# Patient Record
Sex: Female | Born: 1935
Health system: Southern US, Community
[De-identification: ages and names within clinical notes are randomized; demographics above are authoritative.]

## PROBLEM LIST (undated history)

## (undated) DIAGNOSIS — Z9989 Dependence on other enabling machines and devices: Secondary | ICD-10-CM

## (undated) DIAGNOSIS — Z780 Asymptomatic menopausal state: Secondary | ICD-10-CM

## (undated) DIAGNOSIS — C4491 Basal cell carcinoma of skin, unspecified: Secondary | ICD-10-CM

## (undated) DIAGNOSIS — C50919 Malignant neoplasm of unspecified site of unspecified female breast: Secondary | ICD-10-CM

## (undated) DIAGNOSIS — M1990 Unspecified osteoarthritis, unspecified site: Secondary | ICD-10-CM

## (undated) DIAGNOSIS — R7302 Impaired glucose tolerance (oral): Secondary | ICD-10-CM

## (undated) DIAGNOSIS — N390 Urinary tract infection, site not specified: Secondary | ICD-10-CM

## (undated) DIAGNOSIS — F325 Major depressive disorder, single episode, in full remission: Secondary | ICD-10-CM

## (undated) DIAGNOSIS — E039 Hypothyroidism, unspecified: Secondary | ICD-10-CM

## (undated) DIAGNOSIS — E785 Hyperlipidemia, unspecified: Secondary | ICD-10-CM

## (undated) DIAGNOSIS — R7303 Prediabetes: Secondary | ICD-10-CM

## (undated) DIAGNOSIS — I10 Essential (primary) hypertension: Secondary | ICD-10-CM

## (undated) DIAGNOSIS — N6019 Diffuse cystic mastopathy of unspecified breast: Secondary | ICD-10-CM

## (undated) DIAGNOSIS — E042 Nontoxic multinodular goiter: Secondary | ICD-10-CM

## (undated) DIAGNOSIS — N83209 Unspecified ovarian cyst, unspecified side: Secondary | ICD-10-CM

## (undated) DIAGNOSIS — M810 Age-related osteoporosis without current pathological fracture: Secondary | ICD-10-CM

## (undated) DIAGNOSIS — G4733 Obstructive sleep apnea (adult) (pediatric): Secondary | ICD-10-CM

## (undated) HISTORY — DX: Diffuse cystic mastopathy of unspecified breast: N60.19

## (undated) HISTORY — DX: Hyperlipidemia, unspecified: E78.5

## (undated) HISTORY — DX: Age-related osteoporosis without current pathological fracture: M81.0

## (undated) HISTORY — DX: Major depressive disorder, single episode, in full remission: F32.5

## (undated) HISTORY — PX: MASTECTOMY: SHX3

## (undated) HISTORY — DX: Urinary tract infection, site not specified: N39.0

## (undated) HISTORY — PX: URETEROSCOPY WITH HOLMIUM LASER LITHOTRIPSY: SHX6645

## (undated) HISTORY — DX: Basal cell carcinoma of skin, unspecified: C44.91

## (undated) HISTORY — DX: Unspecified osteoarthritis, unspecified site: M19.90

## (undated) HISTORY — DX: Essential (primary) hypertension: I10

## (undated) HISTORY — DX: Malignant neoplasm of unspecified site of unspecified female breast: C50.919

## (undated) HISTORY — DX: Impaired glucose tolerance (oral): R73.02

## (undated) HISTORY — DX: Dependence on other enabling machines and devices: Z99.89

## (undated) HISTORY — DX: Nontoxic multinodular goiter: E04.2

## (undated) HISTORY — DX: Asymptomatic menopausal state: Z78.0

## (undated) HISTORY — DX: Unspecified ovarian cyst, unspecified side: N83.209

## (undated) HISTORY — PX: URETEROSCOPY: SHX842

## (undated) HISTORY — DX: Prediabetes: R73.03

## (undated) HISTORY — DX: Hypothyroidism, unspecified: E03.9

## (undated) HISTORY — DX: Obstructive sleep apnea (adult) (pediatric): G47.33

---

## 1973-08-15 HISTORY — PX: ABDOMINAL HYSTERECTOMY: SUR658

## 1984-02-20 DIAGNOSIS — Z780 Asymptomatic menopausal state: Secondary | ICD-10-CM

## 1984-02-20 HISTORY — DX: Asymptomatic menopausal state: Z78.0

## 2003-01-23 ENCOUNTER — Other Ambulatory Visit: Admission: RE | Admit: 2003-01-23 | Discharge: 2003-01-23 | Payer: Self-pay | Admitting: Obstetrics and Gynecology

## 2003-02-20 DIAGNOSIS — R7302 Impaired glucose tolerance (oral): Secondary | ICD-10-CM | POA: Insufficient documentation

## 2003-02-20 DIAGNOSIS — M810 Age-related osteoporosis without current pathological fracture: Secondary | ICD-10-CM | POA: Insufficient documentation

## 2003-02-20 DIAGNOSIS — M1990 Unspecified osteoarthritis, unspecified site: Secondary | ICD-10-CM | POA: Insufficient documentation

## 2003-02-20 DIAGNOSIS — R7303 Prediabetes: Secondary | ICD-10-CM

## 2003-02-20 HISTORY — DX: Prediabetes: R73.03

## 2003-02-20 HISTORY — DX: Age-related osteoporosis without current pathological fracture: M81.0

## 2003-02-20 HISTORY — DX: Impaired glucose tolerance (oral): R73.02

## 2003-02-20 HISTORY — DX: Unspecified osteoarthritis, unspecified site: M19.90

## 2003-03-06 DIAGNOSIS — E042 Nontoxic multinodular goiter: Secondary | ICD-10-CM

## 2003-03-06 HISTORY — DX: Nontoxic multinodular goiter: E04.2

## 2005-12-06 ENCOUNTER — Ambulatory Visit: Payer: Self-pay | Admitting: Unknown Physician Specialty

## 2007-08-30 ENCOUNTER — Ambulatory Visit: Payer: Self-pay | Admitting: Unknown Physician Specialty

## 2009-02-19 DIAGNOSIS — N83209 Unspecified ovarian cyst, unspecified side: Secondary | ICD-10-CM

## 2009-02-19 HISTORY — DX: Unspecified ovarian cyst, unspecified side: N83.209

## 2010-05-14 ENCOUNTER — Ambulatory Visit: Payer: Self-pay | Admitting: Unknown Physician Specialty

## 2011-08-11 ENCOUNTER — Ambulatory Visit: Payer: Self-pay | Admitting: Internal Medicine

## 2011-08-25 DIAGNOSIS — H6120 Impacted cerumen, unspecified ear: Secondary | ICD-10-CM | POA: Diagnosis not present

## 2011-08-25 DIAGNOSIS — J029 Acute pharyngitis, unspecified: Secondary | ICD-10-CM | POA: Diagnosis not present

## 2011-09-13 DIAGNOSIS — E782 Mixed hyperlipidemia: Secondary | ICD-10-CM | POA: Diagnosis not present

## 2011-09-13 DIAGNOSIS — M810 Age-related osteoporosis without current pathological fracture: Secondary | ICD-10-CM | POA: Diagnosis not present

## 2011-09-13 DIAGNOSIS — N83209 Unspecified ovarian cyst, unspecified side: Secondary | ICD-10-CM | POA: Diagnosis not present

## 2011-09-13 DIAGNOSIS — I10 Essential (primary) hypertension: Secondary | ICD-10-CM | POA: Diagnosis not present

## 2011-09-13 DIAGNOSIS — R300 Dysuria: Secondary | ICD-10-CM | POA: Diagnosis not present

## 2011-09-13 DIAGNOSIS — Z0000 Encounter for general adult medical examination without abnormal findings: Secondary | ICD-10-CM | POA: Diagnosis not present

## 2011-10-29 DIAGNOSIS — H1010 Acute atopic conjunctivitis, unspecified eye: Secondary | ICD-10-CM | POA: Diagnosis not present

## 2011-10-29 DIAGNOSIS — J309 Allergic rhinitis, unspecified: Secondary | ICD-10-CM | POA: Diagnosis not present

## 2011-11-09 DIAGNOSIS — M549 Dorsalgia, unspecified: Secondary | ICD-10-CM | POA: Diagnosis not present

## 2011-11-09 DIAGNOSIS — M542 Cervicalgia: Secondary | ICD-10-CM | POA: Diagnosis not present

## 2011-11-09 DIAGNOSIS — M999 Biomechanical lesion, unspecified: Secondary | ICD-10-CM | POA: Diagnosis not present

## 2011-11-09 DIAGNOSIS — M9981 Other biomechanical lesions of cervical region: Secondary | ICD-10-CM | POA: Diagnosis not present

## 2011-11-18 DIAGNOSIS — J019 Acute sinusitis, unspecified: Secondary | ICD-10-CM | POA: Diagnosis not present

## 2011-11-18 DIAGNOSIS — I10 Essential (primary) hypertension: Secondary | ICD-10-CM | POA: Diagnosis not present

## 2011-11-18 DIAGNOSIS — M542 Cervicalgia: Secondary | ICD-10-CM | POA: Diagnosis not present

## 2011-11-18 DIAGNOSIS — M62838 Other muscle spasm: Secondary | ICD-10-CM | POA: Diagnosis not present

## 2011-11-18 DIAGNOSIS — M810 Age-related osteoporosis without current pathological fracture: Secondary | ICD-10-CM | POA: Diagnosis not present

## 2011-11-18 DIAGNOSIS — F329 Major depressive disorder, single episode, unspecified: Secondary | ICD-10-CM | POA: Diagnosis not present

## 2011-11-29 DIAGNOSIS — H6120 Impacted cerumen, unspecified ear: Secondary | ICD-10-CM | POA: Diagnosis not present

## 2012-01-12 DIAGNOSIS — H43819 Vitreous degeneration, unspecified eye: Secondary | ICD-10-CM | POA: Diagnosis not present

## 2012-01-25 DIAGNOSIS — N83209 Unspecified ovarian cyst, unspecified side: Secondary | ICD-10-CM | POA: Diagnosis not present

## 2012-01-27 DIAGNOSIS — Z79899 Other long term (current) drug therapy: Secondary | ICD-10-CM | POA: Diagnosis not present

## 2012-01-27 DIAGNOSIS — E782 Mixed hyperlipidemia: Secondary | ICD-10-CM | POA: Diagnosis not present

## 2012-01-27 DIAGNOSIS — E559 Vitamin D deficiency, unspecified: Secondary | ICD-10-CM | POA: Diagnosis not present

## 2012-02-08 DIAGNOSIS — M2040 Other hammer toe(s) (acquired), unspecified foot: Secondary | ICD-10-CM | POA: Diagnosis not present

## 2012-02-09 DIAGNOSIS — F5105 Insomnia due to other mental disorder: Secondary | ICD-10-CM | POA: Diagnosis not present

## 2012-02-09 DIAGNOSIS — F329 Major depressive disorder, single episode, unspecified: Secondary | ICD-10-CM | POA: Diagnosis not present

## 2012-02-09 DIAGNOSIS — F489 Nonpsychotic mental disorder, unspecified: Secondary | ICD-10-CM | POA: Diagnosis not present

## 2012-02-09 DIAGNOSIS — E782 Mixed hyperlipidemia: Secondary | ICD-10-CM | POA: Diagnosis not present

## 2012-02-09 DIAGNOSIS — M810 Age-related osteoporosis without current pathological fracture: Secondary | ICD-10-CM | POA: Diagnosis not present

## 2012-02-28 DIAGNOSIS — E039 Hypothyroidism, unspecified: Secondary | ICD-10-CM

## 2012-02-28 HISTORY — DX: Hypothyroidism, unspecified: E03.9

## 2012-03-01 DIAGNOSIS — Z803 Family history of malignant neoplasm of breast: Secondary | ICD-10-CM | POA: Diagnosis not present

## 2012-03-01 DIAGNOSIS — Z1231 Encounter for screening mammogram for malignant neoplasm of breast: Secondary | ICD-10-CM | POA: Diagnosis not present

## 2012-03-05 DIAGNOSIS — Z0000 Encounter for general adult medical examination without abnormal findings: Secondary | ICD-10-CM | POA: Diagnosis not present

## 2012-03-05 DIAGNOSIS — E041 Nontoxic single thyroid nodule: Secondary | ICD-10-CM | POA: Diagnosis not present

## 2012-03-05 DIAGNOSIS — E042 Nontoxic multinodular goiter: Secondary | ICD-10-CM | POA: Diagnosis not present

## 2012-03-08 DIAGNOSIS — N390 Urinary tract infection, site not specified: Secondary | ICD-10-CM | POA: Diagnosis not present

## 2012-03-08 DIAGNOSIS — I10 Essential (primary) hypertension: Secondary | ICD-10-CM | POA: Diagnosis not present

## 2012-03-08 DIAGNOSIS — R300 Dysuria: Secondary | ICD-10-CM | POA: Diagnosis not present

## 2012-03-08 DIAGNOSIS — E782 Mixed hyperlipidemia: Secondary | ICD-10-CM | POA: Diagnosis not present

## 2012-04-19 DIAGNOSIS — I10 Essential (primary) hypertension: Secondary | ICD-10-CM | POA: Diagnosis not present

## 2012-04-19 DIAGNOSIS — M810 Age-related osteoporosis without current pathological fracture: Secondary | ICD-10-CM | POA: Diagnosis not present

## 2012-04-19 DIAGNOSIS — N390 Urinary tract infection, site not specified: Secondary | ICD-10-CM | POA: Diagnosis not present

## 2012-04-19 DIAGNOSIS — E782 Mixed hyperlipidemia: Secondary | ICD-10-CM | POA: Diagnosis not present

## 2012-05-29 DIAGNOSIS — Z23 Encounter for immunization: Secondary | ICD-10-CM | POA: Diagnosis not present

## 2012-06-11 DIAGNOSIS — N390 Urinary tract infection, site not specified: Secondary | ICD-10-CM | POA: Diagnosis not present

## 2012-06-11 DIAGNOSIS — R300 Dysuria: Secondary | ICD-10-CM | POA: Diagnosis not present

## 2012-08-13 DIAGNOSIS — E039 Hypothyroidism, unspecified: Secondary | ICD-10-CM | POA: Diagnosis not present

## 2012-08-13 DIAGNOSIS — E782 Mixed hyperlipidemia: Secondary | ICD-10-CM | POA: Diagnosis not present

## 2012-08-23 DIAGNOSIS — E782 Mixed hyperlipidemia: Secondary | ICD-10-CM | POA: Diagnosis not present

## 2012-08-23 DIAGNOSIS — Z0000 Encounter for general adult medical examination without abnormal findings: Secondary | ICD-10-CM | POA: Diagnosis not present

## 2012-08-23 DIAGNOSIS — F329 Major depressive disorder, single episode, unspecified: Secondary | ICD-10-CM | POA: Diagnosis not present

## 2012-08-23 DIAGNOSIS — N390 Urinary tract infection, site not specified: Secondary | ICD-10-CM | POA: Diagnosis not present

## 2012-08-23 DIAGNOSIS — N952 Postmenopausal atrophic vaginitis: Secondary | ICD-10-CM | POA: Diagnosis not present

## 2012-08-23 DIAGNOSIS — R300 Dysuria: Secondary | ICD-10-CM | POA: Diagnosis not present

## 2012-08-23 DIAGNOSIS — E039 Hypothyroidism, unspecified: Secondary | ICD-10-CM | POA: Diagnosis not present

## 2012-08-23 DIAGNOSIS — R55 Syncope and collapse: Secondary | ICD-10-CM | POA: Diagnosis not present

## 2012-08-31 DIAGNOSIS — R55 Syncope and collapse: Secondary | ICD-10-CM | POA: Diagnosis not present

## 2012-09-17 DIAGNOSIS — M2010 Hallux valgus (acquired), unspecified foot: Secondary | ICD-10-CM | POA: Diagnosis not present

## 2012-09-17 DIAGNOSIS — M2040 Other hammer toe(s) (acquired), unspecified foot: Secondary | ICD-10-CM | POA: Diagnosis not present

## 2012-09-17 DIAGNOSIS — Q6689 Other  specified congenital deformities of feet: Secondary | ICD-10-CM | POA: Diagnosis not present

## 2012-09-26 DIAGNOSIS — D485 Neoplasm of uncertain behavior of skin: Secondary | ICD-10-CM | POA: Diagnosis not present

## 2012-09-26 DIAGNOSIS — L570 Actinic keratosis: Secondary | ICD-10-CM | POA: Diagnosis not present

## 2012-09-26 DIAGNOSIS — Z85828 Personal history of other malignant neoplasm of skin: Secondary | ICD-10-CM | POA: Diagnosis not present

## 2012-09-26 DIAGNOSIS — D0470 Carcinoma in situ of skin of unspecified lower limb, including hip: Secondary | ICD-10-CM | POA: Diagnosis not present

## 2012-10-04 DIAGNOSIS — I10 Essential (primary) hypertension: Secondary | ICD-10-CM | POA: Diagnosis not present

## 2012-10-04 DIAGNOSIS — E039 Hypothyroidism, unspecified: Secondary | ICD-10-CM | POA: Diagnosis not present

## 2012-10-04 DIAGNOSIS — M810 Age-related osteoporosis without current pathological fracture: Secondary | ICD-10-CM | POA: Diagnosis not present

## 2012-10-04 DIAGNOSIS — M542 Cervicalgia: Secondary | ICD-10-CM | POA: Diagnosis not present

## 2012-10-04 DIAGNOSIS — Z006 Encounter for examination for normal comparison and control in clinical research program: Secondary | ICD-10-CM | POA: Diagnosis not present

## 2012-10-04 DIAGNOSIS — E782 Mixed hyperlipidemia: Secondary | ICD-10-CM | POA: Diagnosis not present

## 2012-10-04 DIAGNOSIS — N390 Urinary tract infection, site not specified: Secondary | ICD-10-CM | POA: Diagnosis not present

## 2012-10-04 DIAGNOSIS — R51 Headache: Secondary | ICD-10-CM | POA: Diagnosis not present

## 2012-10-24 DIAGNOSIS — M999 Biomechanical lesion, unspecified: Secondary | ICD-10-CM | POA: Diagnosis not present

## 2012-10-24 DIAGNOSIS — M542 Cervicalgia: Secondary | ICD-10-CM | POA: Diagnosis not present

## 2012-10-24 DIAGNOSIS — M549 Dorsalgia, unspecified: Secondary | ICD-10-CM | POA: Diagnosis not present

## 2012-10-24 DIAGNOSIS — M9981 Other biomechanical lesions of cervical region: Secondary | ICD-10-CM | POA: Diagnosis not present

## 2012-11-02 DIAGNOSIS — M76829 Posterior tibial tendinitis, unspecified leg: Secondary | ICD-10-CM | POA: Diagnosis not present

## 2012-11-15 DIAGNOSIS — D0470 Carcinoma in situ of skin of unspecified lower limb, including hip: Secondary | ICD-10-CM | POA: Diagnosis not present

## 2012-11-28 DIAGNOSIS — M76829 Posterior tibial tendinitis, unspecified leg: Secondary | ICD-10-CM | POA: Diagnosis not present

## 2012-12-03 DIAGNOSIS — E782 Mixed hyperlipidemia: Secondary | ICD-10-CM | POA: Diagnosis not present

## 2012-12-03 DIAGNOSIS — R5381 Other malaise: Secondary | ICD-10-CM | POA: Diagnosis not present

## 2012-12-03 DIAGNOSIS — R300 Dysuria: Secondary | ICD-10-CM | POA: Diagnosis not present

## 2012-12-03 DIAGNOSIS — R5383 Other fatigue: Secondary | ICD-10-CM | POA: Diagnosis not present

## 2012-12-03 DIAGNOSIS — E039 Hypothyroidism, unspecified: Secondary | ICD-10-CM | POA: Diagnosis not present

## 2012-12-03 DIAGNOSIS — N952 Postmenopausal atrophic vaginitis: Secondary | ICD-10-CM | POA: Diagnosis not present

## 2012-12-26 DIAGNOSIS — E039 Hypothyroidism, unspecified: Secondary | ICD-10-CM | POA: Diagnosis not present

## 2013-01-08 DIAGNOSIS — M19079 Primary osteoarthritis, unspecified ankle and foot: Secondary | ICD-10-CM | POA: Diagnosis not present

## 2013-01-08 DIAGNOSIS — R609 Edema, unspecified: Secondary | ICD-10-CM | POA: Diagnosis not present

## 2013-01-15 DIAGNOSIS — M9981 Other biomechanical lesions of cervical region: Secondary | ICD-10-CM | POA: Diagnosis not present

## 2013-01-15 DIAGNOSIS — M999 Biomechanical lesion, unspecified: Secondary | ICD-10-CM | POA: Diagnosis not present

## 2013-01-15 DIAGNOSIS — M5137 Other intervertebral disc degeneration, lumbosacral region: Secondary | ICD-10-CM | POA: Diagnosis not present

## 2013-01-15 DIAGNOSIS — M5030 Other cervical disc degeneration, unspecified cervical region: Secondary | ICD-10-CM | POA: Diagnosis not present

## 2013-01-16 DIAGNOSIS — M5030 Other cervical disc degeneration, unspecified cervical region: Secondary | ICD-10-CM | POA: Diagnosis not present

## 2013-01-16 DIAGNOSIS — M9981 Other biomechanical lesions of cervical region: Secondary | ICD-10-CM | POA: Diagnosis not present

## 2013-01-16 DIAGNOSIS — M5137 Other intervertebral disc degeneration, lumbosacral region: Secondary | ICD-10-CM | POA: Diagnosis not present

## 2013-01-16 DIAGNOSIS — M999 Biomechanical lesion, unspecified: Secondary | ICD-10-CM | POA: Diagnosis not present

## 2013-01-23 DIAGNOSIS — Z0189 Encounter for other specified special examinations: Secondary | ICD-10-CM | POA: Diagnosis not present

## 2013-02-19 DIAGNOSIS — N6019 Diffuse cystic mastopathy of unspecified breast: Secondary | ICD-10-CM | POA: Insufficient documentation

## 2013-02-19 DIAGNOSIS — I10 Essential (primary) hypertension: Secondary | ICD-10-CM

## 2013-02-19 DIAGNOSIS — C4491 Basal cell carcinoma of skin, unspecified: Secondary | ICD-10-CM | POA: Insufficient documentation

## 2013-02-19 DIAGNOSIS — E785 Hyperlipidemia, unspecified: Secondary | ICD-10-CM

## 2013-02-19 HISTORY — DX: Basal cell carcinoma of skin, unspecified: C44.91

## 2013-02-19 HISTORY — DX: Diffuse cystic mastopathy of unspecified breast: N60.19

## 2013-02-19 HISTORY — DX: Essential (primary) hypertension: I10

## 2013-02-19 HISTORY — DX: Hyperlipidemia, unspecified: E78.5

## 2013-02-20 DIAGNOSIS — M5030 Other cervical disc degeneration, unspecified cervical region: Secondary | ICD-10-CM | POA: Diagnosis not present

## 2013-02-20 DIAGNOSIS — M9981 Other biomechanical lesions of cervical region: Secondary | ICD-10-CM | POA: Diagnosis not present

## 2013-02-20 DIAGNOSIS — IMO0002 Reserved for concepts with insufficient information to code with codable children: Secondary | ICD-10-CM | POA: Diagnosis not present

## 2013-02-20 DIAGNOSIS — M999 Biomechanical lesion, unspecified: Secondary | ICD-10-CM | POA: Diagnosis not present

## 2013-02-21 DIAGNOSIS — Z85828 Personal history of other malignant neoplasm of skin: Secondary | ICD-10-CM | POA: Diagnosis not present

## 2013-02-21 DIAGNOSIS — L821 Other seborrheic keratosis: Secondary | ICD-10-CM | POA: Diagnosis not present

## 2013-02-27 DIAGNOSIS — R918 Other nonspecific abnormal finding of lung field: Secondary | ICD-10-CM | POA: Diagnosis not present

## 2013-02-27 DIAGNOSIS — R937 Abnormal findings on diagnostic imaging of other parts of musculoskeletal system: Secondary | ICD-10-CM | POA: Diagnosis not present

## 2013-02-27 DIAGNOSIS — R51 Headache: Secondary | ICD-10-CM | POA: Diagnosis not present

## 2013-02-27 DIAGNOSIS — R609 Edema, unspecified: Secondary | ICD-10-CM | POA: Diagnosis not present

## 2013-02-27 DIAGNOSIS — I10 Essential (primary) hypertension: Secondary | ICD-10-CM | POA: Diagnosis not present

## 2013-02-27 DIAGNOSIS — Z136 Encounter for screening for cardiovascular disorders: Secondary | ICD-10-CM | POA: Diagnosis not present

## 2013-02-27 DIAGNOSIS — M810 Age-related osteoporosis without current pathological fracture: Secondary | ICD-10-CM | POA: Diagnosis not present

## 2013-02-27 DIAGNOSIS — E039 Hypothyroidism, unspecified: Secondary | ICD-10-CM | POA: Diagnosis not present

## 2013-02-27 DIAGNOSIS — E785 Hyperlipidemia, unspecified: Secondary | ICD-10-CM | POA: Diagnosis not present

## 2013-02-27 DIAGNOSIS — R42 Dizziness and giddiness: Secondary | ICD-10-CM | POA: Diagnosis not present

## 2013-02-27 DIAGNOSIS — R269 Unspecified abnormalities of gait and mobility: Secondary | ICD-10-CM | POA: Diagnosis not present

## 2013-02-27 DIAGNOSIS — R011 Cardiac murmur, unspecified: Secondary | ICD-10-CM | POA: Diagnosis not present

## 2013-02-27 DIAGNOSIS — R7309 Other abnormal glucose: Secondary | ICD-10-CM | POA: Diagnosis not present

## 2013-02-27 DIAGNOSIS — E042 Nontoxic multinodular goiter: Secondary | ICD-10-CM | POA: Diagnosis not present

## 2013-02-27 DIAGNOSIS — I700 Atherosclerosis of aorta: Secondary | ICD-10-CM | POA: Diagnosis not present

## 2013-02-27 DIAGNOSIS — N6019 Diffuse cystic mastopathy of unspecified breast: Secondary | ICD-10-CM | POA: Diagnosis not present

## 2013-02-27 DIAGNOSIS — K449 Diaphragmatic hernia without obstruction or gangrene: Secondary | ICD-10-CM | POA: Diagnosis not present

## 2013-03-07 DIAGNOSIS — I517 Cardiomegaly: Secondary | ICD-10-CM | POA: Diagnosis not present

## 2013-03-07 DIAGNOSIS — I059 Rheumatic mitral valve disease, unspecified: Secondary | ICD-10-CM | POA: Diagnosis not present

## 2013-03-07 DIAGNOSIS — I079 Rheumatic tricuspid valve disease, unspecified: Secondary | ICD-10-CM | POA: Diagnosis not present

## 2013-03-07 DIAGNOSIS — Z0000 Encounter for general adult medical examination without abnormal findings: Secondary | ICD-10-CM | POA: Diagnosis not present

## 2013-03-07 DIAGNOSIS — R51 Headache: Secondary | ICD-10-CM | POA: Diagnosis not present

## 2013-03-07 DIAGNOSIS — R42 Dizziness and giddiness: Secondary | ICD-10-CM | POA: Diagnosis not present

## 2013-03-07 DIAGNOSIS — R011 Cardiac murmur, unspecified: Secondary | ICD-10-CM | POA: Diagnosis not present

## 2013-03-07 DIAGNOSIS — I6789 Other cerebrovascular disease: Secondary | ICD-10-CM | POA: Diagnosis not present

## 2013-03-13 DIAGNOSIS — H8110 Benign paroxysmal vertigo, unspecified ear: Secondary | ICD-10-CM | POA: Diagnosis not present

## 2013-03-13 DIAGNOSIS — H6120 Impacted cerumen, unspecified ear: Secondary | ICD-10-CM | POA: Diagnosis not present

## 2013-03-15 DIAGNOSIS — Z1231 Encounter for screening mammogram for malignant neoplasm of breast: Secondary | ICD-10-CM | POA: Diagnosis not present

## 2013-03-27 DIAGNOSIS — H2510 Age-related nuclear cataract, unspecified eye: Secondary | ICD-10-CM | POA: Diagnosis not present

## 2013-04-04 DIAGNOSIS — M25579 Pain in unspecified ankle and joints of unspecified foot: Secondary | ICD-10-CM | POA: Diagnosis not present

## 2013-04-09 DIAGNOSIS — M25579 Pain in unspecified ankle and joints of unspecified foot: Secondary | ICD-10-CM | POA: Diagnosis not present

## 2013-04-11 DIAGNOSIS — M25579 Pain in unspecified ankle and joints of unspecified foot: Secondary | ICD-10-CM | POA: Diagnosis not present

## 2013-04-15 DIAGNOSIS — M25579 Pain in unspecified ankle and joints of unspecified foot: Secondary | ICD-10-CM | POA: Diagnosis not present

## 2013-04-16 DIAGNOSIS — M25579 Pain in unspecified ankle and joints of unspecified foot: Secondary | ICD-10-CM | POA: Diagnosis not present

## 2013-04-19 DIAGNOSIS — M25579 Pain in unspecified ankle and joints of unspecified foot: Secondary | ICD-10-CM | POA: Diagnosis not present

## 2013-04-23 DIAGNOSIS — M25579 Pain in unspecified ankle and joints of unspecified foot: Secondary | ICD-10-CM | POA: Diagnosis not present

## 2013-04-25 DIAGNOSIS — M25579 Pain in unspecified ankle and joints of unspecified foot: Secondary | ICD-10-CM | POA: Diagnosis not present

## 2013-04-30 DIAGNOSIS — M25579 Pain in unspecified ankle and joints of unspecified foot: Secondary | ICD-10-CM | POA: Diagnosis not present

## 2013-05-02 DIAGNOSIS — M25579 Pain in unspecified ankle and joints of unspecified foot: Secondary | ICD-10-CM | POA: Diagnosis not present

## 2013-05-07 DIAGNOSIS — M25579 Pain in unspecified ankle and joints of unspecified foot: Secondary | ICD-10-CM | POA: Diagnosis not present

## 2013-05-09 DIAGNOSIS — E782 Mixed hyperlipidemia: Secondary | ICD-10-CM | POA: Diagnosis not present

## 2013-05-09 DIAGNOSIS — F489 Nonpsychotic mental disorder, unspecified: Secondary | ICD-10-CM | POA: Diagnosis not present

## 2013-05-09 DIAGNOSIS — E039 Hypothyroidism, unspecified: Secondary | ICD-10-CM | POA: Diagnosis not present

## 2013-05-09 DIAGNOSIS — M810 Age-related osteoporosis without current pathological fracture: Secondary | ICD-10-CM | POA: Diagnosis not present

## 2013-05-09 DIAGNOSIS — R300 Dysuria: Secondary | ICD-10-CM | POA: Diagnosis not present

## 2013-05-09 DIAGNOSIS — F5105 Insomnia due to other mental disorder: Secondary | ICD-10-CM | POA: Diagnosis not present

## 2013-05-09 DIAGNOSIS — M19079 Primary osteoarthritis, unspecified ankle and foot: Secondary | ICD-10-CM | POA: Diagnosis not present

## 2013-05-09 DIAGNOSIS — Z23 Encounter for immunization: Secondary | ICD-10-CM | POA: Diagnosis not present

## 2013-05-14 DIAGNOSIS — M25579 Pain in unspecified ankle and joints of unspecified foot: Secondary | ICD-10-CM | POA: Diagnosis not present

## 2013-05-15 DIAGNOSIS — M25579 Pain in unspecified ankle and joints of unspecified foot: Secondary | ICD-10-CM | POA: Diagnosis not present

## 2013-05-16 DIAGNOSIS — M25579 Pain in unspecified ankle and joints of unspecified foot: Secondary | ICD-10-CM | POA: Diagnosis not present

## 2013-05-21 DIAGNOSIS — M25579 Pain in unspecified ankle and joints of unspecified foot: Secondary | ICD-10-CM | POA: Diagnosis not present

## 2013-05-23 DIAGNOSIS — M25579 Pain in unspecified ankle and joints of unspecified foot: Secondary | ICD-10-CM | POA: Diagnosis not present

## 2013-06-03 DIAGNOSIS — R300 Dysuria: Secondary | ICD-10-CM | POA: Diagnosis not present

## 2013-06-03 DIAGNOSIS — N390 Urinary tract infection, site not specified: Secondary | ICD-10-CM | POA: Diagnosis not present

## 2013-06-03 DIAGNOSIS — R3129 Other microscopic hematuria: Secondary | ICD-10-CM | POA: Diagnosis not present

## 2013-06-03 DIAGNOSIS — N952 Postmenopausal atrophic vaginitis: Secondary | ICD-10-CM | POA: Diagnosis not present

## 2013-06-17 DIAGNOSIS — N390 Urinary tract infection, site not specified: Secondary | ICD-10-CM | POA: Diagnosis not present

## 2013-06-17 DIAGNOSIS — R319 Hematuria, unspecified: Secondary | ICD-10-CM | POA: Diagnosis not present

## 2013-06-28 DIAGNOSIS — N390 Urinary tract infection, site not specified: Secondary | ICD-10-CM | POA: Diagnosis not present

## 2013-07-04 DIAGNOSIS — R300 Dysuria: Secondary | ICD-10-CM | POA: Diagnosis not present

## 2013-07-04 DIAGNOSIS — N390 Urinary tract infection, site not specified: Secondary | ICD-10-CM | POA: Diagnosis not present

## 2013-07-04 DIAGNOSIS — R319 Hematuria, unspecified: Secondary | ICD-10-CM | POA: Diagnosis not present

## 2013-07-23 DIAGNOSIS — N952 Postmenopausal atrophic vaginitis: Secondary | ICD-10-CM | POA: Diagnosis not present

## 2013-07-23 DIAGNOSIS — N390 Urinary tract infection, site not specified: Secondary | ICD-10-CM | POA: Diagnosis not present

## 2013-08-20 DIAGNOSIS — N390 Urinary tract infection, site not specified: Secondary | ICD-10-CM | POA: Diagnosis not present

## 2013-08-20 DIAGNOSIS — N952 Postmenopausal atrophic vaginitis: Secondary | ICD-10-CM | POA: Diagnosis not present

## 2013-09-05 DIAGNOSIS — F3289 Other specified depressive episodes: Secondary | ICD-10-CM | POA: Diagnosis not present

## 2013-09-05 DIAGNOSIS — R5383 Other fatigue: Secondary | ICD-10-CM | POA: Diagnosis not present

## 2013-09-05 DIAGNOSIS — N390 Urinary tract infection, site not specified: Secondary | ICD-10-CM | POA: Diagnosis not present

## 2013-09-05 DIAGNOSIS — E039 Hypothyroidism, unspecified: Secondary | ICD-10-CM | POA: Diagnosis not present

## 2013-09-05 DIAGNOSIS — M810 Age-related osteoporosis without current pathological fracture: Secondary | ICD-10-CM | POA: Diagnosis not present

## 2013-09-05 DIAGNOSIS — R300 Dysuria: Secondary | ICD-10-CM | POA: Diagnosis not present

## 2013-09-05 DIAGNOSIS — R5381 Other malaise: Secondary | ICD-10-CM | POA: Diagnosis not present

## 2013-09-05 DIAGNOSIS — F329 Major depressive disorder, single episode, unspecified: Secondary | ICD-10-CM | POA: Diagnosis not present

## 2013-09-09 DIAGNOSIS — E785 Hyperlipidemia, unspecified: Secondary | ICD-10-CM | POA: Diagnosis not present

## 2013-09-09 DIAGNOSIS — M810 Age-related osteoporosis without current pathological fracture: Secondary | ICD-10-CM | POA: Diagnosis not present

## 2013-09-09 DIAGNOSIS — E039 Hypothyroidism, unspecified: Secondary | ICD-10-CM | POA: Diagnosis not present

## 2013-09-09 DIAGNOSIS — R7309 Other abnormal glucose: Secondary | ICD-10-CM | POA: Diagnosis not present

## 2013-09-09 DIAGNOSIS — I10 Essential (primary) hypertension: Secondary | ICD-10-CM | POA: Diagnosis not present

## 2013-09-09 DIAGNOSIS — Z79899 Other long term (current) drug therapy: Secondary | ICD-10-CM | POA: Diagnosis not present

## 2013-09-09 DIAGNOSIS — E042 Nontoxic multinodular goiter: Secondary | ICD-10-CM | POA: Diagnosis not present

## 2013-09-19 DIAGNOSIS — H6120 Impacted cerumen, unspecified ear: Secondary | ICD-10-CM | POA: Diagnosis not present

## 2013-09-19 DIAGNOSIS — J018 Other acute sinusitis: Secondary | ICD-10-CM | POA: Diagnosis not present

## 2013-09-26 DIAGNOSIS — B372 Candidiasis of skin and nail: Secondary | ICD-10-CM | POA: Diagnosis not present

## 2013-09-26 DIAGNOSIS — L570 Actinic keratosis: Secondary | ICD-10-CM | POA: Diagnosis not present

## 2013-09-27 DIAGNOSIS — N390 Urinary tract infection, site not specified: Secondary | ICD-10-CM | POA: Diagnosis not present

## 2013-10-24 DIAGNOSIS — N390 Urinary tract infection, site not specified: Secondary | ICD-10-CM | POA: Diagnosis not present

## 2013-10-24 DIAGNOSIS — I10 Essential (primary) hypertension: Secondary | ICD-10-CM | POA: Diagnosis not present

## 2013-10-24 DIAGNOSIS — R300 Dysuria: Secondary | ICD-10-CM | POA: Diagnosis not present

## 2013-11-05 DIAGNOSIS — F3289 Other specified depressive episodes: Secondary | ICD-10-CM | POA: Diagnosis not present

## 2013-11-05 DIAGNOSIS — E782 Mixed hyperlipidemia: Secondary | ICD-10-CM | POA: Diagnosis not present

## 2013-11-05 DIAGNOSIS — R300 Dysuria: Secondary | ICD-10-CM | POA: Diagnosis not present

## 2013-11-05 DIAGNOSIS — F5105 Insomnia due to other mental disorder: Secondary | ICD-10-CM | POA: Diagnosis not present

## 2013-11-05 DIAGNOSIS — F329 Major depressive disorder, single episode, unspecified: Secondary | ICD-10-CM | POA: Diagnosis not present

## 2013-11-05 DIAGNOSIS — F489 Nonpsychotic mental disorder, unspecified: Secondary | ICD-10-CM | POA: Diagnosis not present

## 2013-11-05 DIAGNOSIS — E039 Hypothyroidism, unspecified: Secondary | ICD-10-CM | POA: Diagnosis not present

## 2013-12-19 DIAGNOSIS — R059 Cough, unspecified: Secondary | ICD-10-CM | POA: Diagnosis not present

## 2013-12-19 DIAGNOSIS — R05 Cough: Secondary | ICD-10-CM | POA: Diagnosis not present

## 2013-12-19 DIAGNOSIS — R350 Frequency of micturition: Secondary | ICD-10-CM | POA: Diagnosis not present

## 2013-12-19 DIAGNOSIS — R300 Dysuria: Secondary | ICD-10-CM | POA: Diagnosis not present

## 2013-12-19 DIAGNOSIS — J309 Allergic rhinitis, unspecified: Secondary | ICD-10-CM | POA: Diagnosis not present

## 2013-12-19 DIAGNOSIS — N390 Urinary tract infection, site not specified: Secondary | ICD-10-CM | POA: Diagnosis not present

## 2014-01-09 DIAGNOSIS — D692 Other nonthrombocytopenic purpura: Secondary | ICD-10-CM | POA: Diagnosis not present

## 2014-01-09 DIAGNOSIS — L821 Other seborrheic keratosis: Secondary | ICD-10-CM | POA: Diagnosis not present

## 2014-01-09 DIAGNOSIS — L820 Inflamed seborrheic keratosis: Secondary | ICD-10-CM | POA: Diagnosis not present

## 2014-01-09 DIAGNOSIS — K130 Diseases of lips: Secondary | ICD-10-CM | POA: Diagnosis not present

## 2014-01-09 DIAGNOSIS — L570 Actinic keratosis: Secondary | ICD-10-CM | POA: Diagnosis not present

## 2014-01-09 DIAGNOSIS — D239 Other benign neoplasm of skin, unspecified: Secondary | ICD-10-CM | POA: Diagnosis not present

## 2014-01-09 DIAGNOSIS — L578 Other skin changes due to chronic exposure to nonionizing radiation: Secondary | ICD-10-CM | POA: Diagnosis not present

## 2014-01-09 DIAGNOSIS — Z85828 Personal history of other malignant neoplasm of skin: Secondary | ICD-10-CM | POA: Diagnosis not present

## 2014-02-26 DIAGNOSIS — E782 Mixed hyperlipidemia: Secondary | ICD-10-CM | POA: Diagnosis not present

## 2014-02-26 DIAGNOSIS — E039 Hypothyroidism, unspecified: Secondary | ICD-10-CM | POA: Diagnosis not present

## 2014-02-26 DIAGNOSIS — I10 Essential (primary) hypertension: Secondary | ICD-10-CM | POA: Diagnosis not present

## 2014-03-06 DIAGNOSIS — J309 Allergic rhinitis, unspecified: Secondary | ICD-10-CM | POA: Diagnosis not present

## 2014-03-06 DIAGNOSIS — R350 Frequency of micturition: Secondary | ICD-10-CM | POA: Diagnosis not present

## 2014-03-06 DIAGNOSIS — E039 Hypothyroidism, unspecified: Secondary | ICD-10-CM | POA: Diagnosis not present

## 2014-03-06 DIAGNOSIS — N952 Postmenopausal atrophic vaginitis: Secondary | ICD-10-CM | POA: Diagnosis not present

## 2014-03-18 DIAGNOSIS — R300 Dysuria: Secondary | ICD-10-CM | POA: Diagnosis not present

## 2014-04-16 DIAGNOSIS — Z23 Encounter for immunization: Secondary | ICD-10-CM | POA: Diagnosis not present

## 2014-04-16 DIAGNOSIS — E042 Nontoxic multinodular goiter: Secondary | ICD-10-CM | POA: Diagnosis not present

## 2014-04-16 DIAGNOSIS — R7309 Other abnormal glucose: Secondary | ICD-10-CM | POA: Diagnosis not present

## 2014-04-16 DIAGNOSIS — E039 Hypothyroidism, unspecified: Secondary | ICD-10-CM | POA: Diagnosis not present

## 2014-04-16 DIAGNOSIS — N6019 Diffuse cystic mastopathy of unspecified breast: Secondary | ICD-10-CM | POA: Diagnosis not present

## 2014-04-16 DIAGNOSIS — N951 Menopausal and female climacteric states: Secondary | ICD-10-CM | POA: Diagnosis not present

## 2014-04-16 DIAGNOSIS — Z136 Encounter for screening for cardiovascular disorders: Secondary | ICD-10-CM | POA: Diagnosis not present

## 2014-04-16 DIAGNOSIS — N83209 Unspecified ovarian cyst, unspecified side: Secondary | ICD-10-CM | POA: Diagnosis not present

## 2014-04-16 DIAGNOSIS — I10 Essential (primary) hypertension: Secondary | ICD-10-CM | POA: Diagnosis not present

## 2014-04-16 DIAGNOSIS — E785 Hyperlipidemia, unspecified: Secondary | ICD-10-CM | POA: Diagnosis not present

## 2014-04-16 DIAGNOSIS — M810 Age-related osteoporosis without current pathological fracture: Secondary | ICD-10-CM | POA: Diagnosis not present

## 2014-04-17 DIAGNOSIS — Z803 Family history of malignant neoplasm of breast: Secondary | ICD-10-CM | POA: Diagnosis not present

## 2014-04-17 DIAGNOSIS — Z1231 Encounter for screening mammogram for malignant neoplasm of breast: Secondary | ICD-10-CM | POA: Diagnosis not present

## 2014-04-22 DIAGNOSIS — H2510 Age-related nuclear cataract, unspecified eye: Secondary | ICD-10-CM | POA: Diagnosis not present

## 2014-04-30 DIAGNOSIS — E039 Hypothyroidism, unspecified: Secondary | ICD-10-CM | POA: Diagnosis not present

## 2014-04-30 DIAGNOSIS — R300 Dysuria: Secondary | ICD-10-CM | POA: Diagnosis not present

## 2014-04-30 DIAGNOSIS — M810 Age-related osteoporosis without current pathological fracture: Secondary | ICD-10-CM | POA: Diagnosis not present

## 2014-05-19 DIAGNOSIS — Z23 Encounter for immunization: Secondary | ICD-10-CM | POA: Diagnosis not present

## 2014-06-10 DIAGNOSIS — E782 Mixed hyperlipidemia: Secondary | ICD-10-CM | POA: Diagnosis not present

## 2014-06-10 DIAGNOSIS — F329 Major depressive disorder, single episode, unspecified: Secondary | ICD-10-CM | POA: Diagnosis not present

## 2014-06-10 DIAGNOSIS — M810 Age-related osteoporosis without current pathological fracture: Secondary | ICD-10-CM | POA: Diagnosis not present

## 2014-06-10 DIAGNOSIS — N390 Urinary tract infection, site not specified: Secondary | ICD-10-CM | POA: Diagnosis not present

## 2014-06-10 DIAGNOSIS — I10 Essential (primary) hypertension: Secondary | ICD-10-CM | POA: Diagnosis not present

## 2014-06-10 DIAGNOSIS — E039 Hypothyroidism, unspecified: Secondary | ICD-10-CM | POA: Diagnosis not present

## 2014-08-06 DIAGNOSIS — H905 Unspecified sensorineural hearing loss: Secondary | ICD-10-CM | POA: Diagnosis not present

## 2014-08-06 DIAGNOSIS — H6123 Impacted cerumen, bilateral: Secondary | ICD-10-CM | POA: Diagnosis not present

## 2014-09-04 DIAGNOSIS — E039 Hypothyroidism, unspecified: Secondary | ICD-10-CM | POA: Diagnosis not present

## 2014-09-04 DIAGNOSIS — N390 Urinary tract infection, site not specified: Secondary | ICD-10-CM | POA: Diagnosis not present

## 2014-09-04 DIAGNOSIS — R309 Painful micturition, unspecified: Secondary | ICD-10-CM | POA: Diagnosis not present

## 2014-09-04 DIAGNOSIS — I10 Essential (primary) hypertension: Secondary | ICD-10-CM | POA: Diagnosis not present

## 2014-09-17 DIAGNOSIS — Z85828 Personal history of other malignant neoplasm of skin: Secondary | ICD-10-CM | POA: Diagnosis not present

## 2014-09-17 DIAGNOSIS — D1801 Hemangioma of skin and subcutaneous tissue: Secondary | ICD-10-CM | POA: Diagnosis not present

## 2014-09-17 DIAGNOSIS — L304 Erythema intertrigo: Secondary | ICD-10-CM | POA: Diagnosis not present

## 2014-10-13 DIAGNOSIS — E782 Mixed hyperlipidemia: Secondary | ICD-10-CM | POA: Diagnosis not present

## 2014-10-13 DIAGNOSIS — M810 Age-related osteoporosis without current pathological fracture: Secondary | ICD-10-CM | POA: Diagnosis not present

## 2014-10-13 DIAGNOSIS — E039 Hypothyroidism, unspecified: Secondary | ICD-10-CM | POA: Diagnosis not present

## 2014-10-13 DIAGNOSIS — Z87440 Personal history of urinary (tract) infections: Secondary | ICD-10-CM | POA: Diagnosis not present

## 2014-10-13 DIAGNOSIS — G4700 Insomnia, unspecified: Secondary | ICD-10-CM | POA: Diagnosis not present

## 2014-10-13 DIAGNOSIS — R312 Other microscopic hematuria: Secondary | ICD-10-CM | POA: Diagnosis not present

## 2014-10-13 DIAGNOSIS — N390 Urinary tract infection, site not specified: Secondary | ICD-10-CM | POA: Diagnosis not present

## 2014-10-13 DIAGNOSIS — I10 Essential (primary) hypertension: Secondary | ICD-10-CM | POA: Diagnosis not present

## 2014-10-16 DIAGNOSIS — R0600 Dyspnea, unspecified: Secondary | ICD-10-CM | POA: Diagnosis not present

## 2014-10-16 DIAGNOSIS — R5381 Other malaise: Secondary | ICD-10-CM | POA: Diagnosis not present

## 2014-10-16 DIAGNOSIS — K219 Gastro-esophageal reflux disease without esophagitis: Secondary | ICD-10-CM | POA: Diagnosis not present

## 2014-10-16 DIAGNOSIS — R197 Diarrhea, unspecified: Secondary | ICD-10-CM | POA: Diagnosis not present

## 2014-10-16 DIAGNOSIS — M129 Arthropathy, unspecified: Secondary | ICD-10-CM | POA: Diagnosis not present

## 2014-10-16 DIAGNOSIS — I83811 Varicose veins of right lower extremities with pain: Secondary | ICD-10-CM | POA: Diagnosis not present

## 2014-10-19 DIAGNOSIS — N390 Urinary tract infection, site not specified: Secondary | ICD-10-CM

## 2014-10-19 HISTORY — DX: Urinary tract infection, site not specified: N39.0

## 2014-10-20 DIAGNOSIS — H2513 Age-related nuclear cataract, bilateral: Secondary | ICD-10-CM | POA: Diagnosis not present

## 2014-10-30 DIAGNOSIS — R600 Localized edema: Secondary | ICD-10-CM | POA: Diagnosis not present

## 2014-10-30 DIAGNOSIS — M79669 Pain in unspecified lower leg: Secondary | ICD-10-CM | POA: Diagnosis not present

## 2014-11-06 DIAGNOSIS — R011 Cardiac murmur, unspecified: Secondary | ICD-10-CM | POA: Diagnosis not present

## 2014-11-14 DIAGNOSIS — Z0001 Encounter for general adult medical examination with abnormal findings: Secondary | ICD-10-CM | POA: Diagnosis not present

## 2014-11-14 DIAGNOSIS — I10 Essential (primary) hypertension: Secondary | ICD-10-CM | POA: Diagnosis not present

## 2014-11-14 DIAGNOSIS — E039 Hypothyroidism, unspecified: Secondary | ICD-10-CM | POA: Diagnosis not present

## 2014-11-14 DIAGNOSIS — N390 Urinary tract infection, site not specified: Secondary | ICD-10-CM | POA: Diagnosis not present

## 2014-11-14 DIAGNOSIS — N399 Disorder of urinary system, unspecified: Secondary | ICD-10-CM | POA: Diagnosis not present

## 2014-11-18 DIAGNOSIS — H2513 Age-related nuclear cataract, bilateral: Secondary | ICD-10-CM | POA: Diagnosis not present

## 2014-11-25 DIAGNOSIS — E782 Mixed hyperlipidemia: Secondary | ICD-10-CM | POA: Diagnosis not present

## 2014-11-25 DIAGNOSIS — N390 Urinary tract infection, site not specified: Secondary | ICD-10-CM | POA: Diagnosis not present

## 2014-11-25 DIAGNOSIS — F329 Major depressive disorder, single episode, unspecified: Secondary | ICD-10-CM | POA: Diagnosis not present

## 2014-11-25 DIAGNOSIS — E039 Hypothyroidism, unspecified: Secondary | ICD-10-CM | POA: Diagnosis not present

## 2014-11-25 DIAGNOSIS — R0600 Dyspnea, unspecified: Secondary | ICD-10-CM | POA: Diagnosis not present

## 2014-11-25 DIAGNOSIS — K219 Gastro-esophageal reflux disease without esophagitis: Secondary | ICD-10-CM | POA: Diagnosis not present

## 2014-12-09 DIAGNOSIS — Z87440 Personal history of urinary (tract) infections: Secondary | ICD-10-CM | POA: Diagnosis not present

## 2014-12-09 DIAGNOSIS — N390 Urinary tract infection, site not specified: Secondary | ICD-10-CM | POA: Diagnosis not present

## 2014-12-09 DIAGNOSIS — N3020 Other chronic cystitis without hematuria: Secondary | ICD-10-CM | POA: Diagnosis not present

## 2014-12-19 DIAGNOSIS — M47819 Spondylosis without myelopathy or radiculopathy, site unspecified: Secondary | ICD-10-CM | POA: Diagnosis not present

## 2014-12-19 DIAGNOSIS — N202 Calculus of kidney with calculus of ureter: Secondary | ICD-10-CM | POA: Diagnosis not present

## 2014-12-19 DIAGNOSIS — N135 Crossing vessel and stricture of ureter without hydronephrosis: Secondary | ICD-10-CM | POA: Diagnosis not present

## 2014-12-19 DIAGNOSIS — K8020 Calculus of gallbladder without cholecystitis without obstruction: Secondary | ICD-10-CM | POA: Diagnosis not present

## 2014-12-19 DIAGNOSIS — K449 Diaphragmatic hernia without obstruction or gangrene: Secondary | ICD-10-CM | POA: Diagnosis not present

## 2014-12-19 DIAGNOSIS — R312 Other microscopic hematuria: Secondary | ICD-10-CM | POA: Diagnosis not present

## 2014-12-19 DIAGNOSIS — Z90710 Acquired absence of both cervix and uterus: Secondary | ICD-10-CM | POA: Diagnosis not present

## 2014-12-19 DIAGNOSIS — K868 Other specified diseases of pancreas: Secondary | ICD-10-CM | POA: Diagnosis not present

## 2014-12-19 DIAGNOSIS — I272 Other secondary pulmonary hypertension: Secondary | ICD-10-CM | POA: Diagnosis not present

## 2014-12-19 DIAGNOSIS — N281 Cyst of kidney, acquired: Secondary | ICD-10-CM | POA: Diagnosis not present

## 2014-12-23 DIAGNOSIS — I279 Pulmonary heart disease, unspecified: Secondary | ICD-10-CM | POA: Diagnosis not present

## 2014-12-23 DIAGNOSIS — R0902 Hypoxemia: Secondary | ICD-10-CM | POA: Diagnosis not present

## 2014-12-30 DIAGNOSIS — N200 Calculus of kidney: Secondary | ICD-10-CM | POA: Diagnosis not present

## 2014-12-30 DIAGNOSIS — N390 Urinary tract infection, site not specified: Secondary | ICD-10-CM | POA: Diagnosis not present

## 2015-01-09 DIAGNOSIS — H2513 Age-related nuclear cataract, bilateral: Secondary | ICD-10-CM | POA: Diagnosis not present

## 2015-01-13 DIAGNOSIS — G4733 Obstructive sleep apnea (adult) (pediatric): Secondary | ICD-10-CM | POA: Insufficient documentation

## 2015-01-13 DIAGNOSIS — Z9989 Dependence on other enabling machines and devices: Secondary | ICD-10-CM

## 2015-01-13 HISTORY — DX: Obstructive sleep apnea (adult) (pediatric): G47.33

## 2015-01-15 DIAGNOSIS — I10 Essential (primary) hypertension: Secondary | ICD-10-CM | POA: Diagnosis not present

## 2015-01-15 DIAGNOSIS — E039 Hypothyroidism, unspecified: Secondary | ICD-10-CM | POA: Diagnosis not present

## 2015-01-15 DIAGNOSIS — N200 Calculus of kidney: Secondary | ICD-10-CM | POA: Diagnosis not present

## 2015-01-15 DIAGNOSIS — Z01818 Encounter for other preprocedural examination: Secondary | ICD-10-CM | POA: Diagnosis not present

## 2015-01-15 DIAGNOSIS — E785 Hyperlipidemia, unspecified: Secondary | ICD-10-CM | POA: Diagnosis not present

## 2015-01-19 DIAGNOSIS — E039 Hypothyroidism, unspecified: Secondary | ICD-10-CM | POA: Diagnosis not present

## 2015-01-19 DIAGNOSIS — I279 Pulmonary heart disease, unspecified: Secondary | ICD-10-CM | POA: Diagnosis not present

## 2015-01-19 DIAGNOSIS — G4733 Obstructive sleep apnea (adult) (pediatric): Secondary | ICD-10-CM | POA: Diagnosis not present

## 2015-01-28 DIAGNOSIS — N135 Crossing vessel and stricture of ureter without hydronephrosis: Secondary | ICD-10-CM | POA: Diagnosis not present

## 2015-01-28 DIAGNOSIS — Z803 Family history of malignant neoplasm of breast: Secondary | ICD-10-CM | POA: Diagnosis not present

## 2015-01-28 DIAGNOSIS — E782 Mixed hyperlipidemia: Secondary | ICD-10-CM | POA: Diagnosis not present

## 2015-01-28 DIAGNOSIS — R350 Frequency of micturition: Secondary | ICD-10-CM | POA: Diagnosis not present

## 2015-01-28 DIAGNOSIS — R312 Other microscopic hematuria: Secondary | ICD-10-CM | POA: Diagnosis not present

## 2015-01-28 DIAGNOSIS — N3080 Other cystitis without hematuria: Secondary | ICD-10-CM | POA: Diagnosis not present

## 2015-01-28 DIAGNOSIS — E039 Hypothyroidism, unspecified: Secondary | ICD-10-CM | POA: Diagnosis not present

## 2015-01-28 DIAGNOSIS — Z808 Family history of malignant neoplasm of other organs or systems: Secondary | ICD-10-CM | POA: Diagnosis not present

## 2015-01-28 DIAGNOSIS — N200 Calculus of kidney: Secondary | ICD-10-CM | POA: Diagnosis not present

## 2015-01-28 DIAGNOSIS — I10 Essential (primary) hypertension: Secondary | ICD-10-CM | POA: Diagnosis not present

## 2015-01-28 DIAGNOSIS — G4700 Insomnia, unspecified: Secondary | ICD-10-CM | POA: Diagnosis not present

## 2015-01-28 DIAGNOSIS — Z880 Allergy status to penicillin: Secondary | ICD-10-CM | POA: Diagnosis not present

## 2015-02-04 DIAGNOSIS — G4733 Obstructive sleep apnea (adult) (pediatric): Secondary | ICD-10-CM | POA: Diagnosis not present

## 2015-02-12 DIAGNOSIS — N200 Calculus of kidney: Secondary | ICD-10-CM | POA: Diagnosis not present

## 2015-02-23 DIAGNOSIS — G4733 Obstructive sleep apnea (adult) (pediatric): Secondary | ICD-10-CM | POA: Diagnosis not present

## 2015-02-25 DIAGNOSIS — H2512 Age-related nuclear cataract, left eye: Secondary | ICD-10-CM | POA: Diagnosis not present

## 2015-03-10 DIAGNOSIS — R3129 Other microscopic hematuria: Secondary | ICD-10-CM | POA: Insufficient documentation

## 2015-03-10 DIAGNOSIS — R319 Hematuria, unspecified: Secondary | ICD-10-CM | POA: Diagnosis not present

## 2015-03-10 DIAGNOSIS — M868X8 Other osteomyelitis, other site: Secondary | ICD-10-CM | POA: Diagnosis not present

## 2015-03-10 DIAGNOSIS — I878 Other specified disorders of veins: Secondary | ICD-10-CM | POA: Diagnosis not present

## 2015-03-10 DIAGNOSIS — N200 Calculus of kidney: Secondary | ICD-10-CM | POA: Diagnosis not present

## 2015-03-10 DIAGNOSIS — R312 Other microscopic hematuria: Secondary | ICD-10-CM | POA: Diagnosis not present

## 2015-03-23 DIAGNOSIS — M810 Age-related osteoporosis without current pathological fracture: Secondary | ICD-10-CM | POA: Diagnosis not present

## 2015-03-23 DIAGNOSIS — N200 Calculus of kidney: Secondary | ICD-10-CM | POA: Diagnosis not present

## 2015-03-23 DIAGNOSIS — I10 Essential (primary) hypertension: Secondary | ICD-10-CM | POA: Diagnosis not present

## 2015-03-23 DIAGNOSIS — G4733 Obstructive sleep apnea (adult) (pediatric): Secondary | ICD-10-CM | POA: Diagnosis not present

## 2015-04-02 DIAGNOSIS — N200 Calculus of kidney: Secondary | ICD-10-CM | POA: Diagnosis not present

## 2015-04-08 DIAGNOSIS — E785 Hyperlipidemia, unspecified: Secondary | ICD-10-CM | POA: Diagnosis not present

## 2015-04-08 DIAGNOSIS — N200 Calculus of kidney: Secondary | ICD-10-CM | POA: Diagnosis not present

## 2015-04-08 DIAGNOSIS — E039 Hypothyroidism, unspecified: Secondary | ICD-10-CM | POA: Diagnosis not present

## 2015-04-08 DIAGNOSIS — I10 Essential (primary) hypertension: Secondary | ICD-10-CM | POA: Diagnosis not present

## 2015-04-08 DIAGNOSIS — N1339 Other hydronephrosis: Secondary | ICD-10-CM | POA: Diagnosis not present

## 2015-04-08 DIAGNOSIS — Z79899 Other long term (current) drug therapy: Secondary | ICD-10-CM | POA: Diagnosis not present

## 2015-04-23 DIAGNOSIS — Z960 Presence of urogenital implants: Secondary | ICD-10-CM | POA: Diagnosis not present

## 2015-04-23 DIAGNOSIS — N200 Calculus of kidney: Secondary | ICD-10-CM | POA: Diagnosis not present

## 2015-04-29 ENCOUNTER — Encounter: Payer: Self-pay | Admitting: Unknown Physician Specialty

## 2015-05-05 DIAGNOSIS — Z1231 Encounter for screening mammogram for malignant neoplasm of breast: Secondary | ICD-10-CM | POA: Diagnosis not present

## 2015-05-05 DIAGNOSIS — Z803 Family history of malignant neoplasm of breast: Secondary | ICD-10-CM | POA: Diagnosis not present

## 2015-05-25 DIAGNOSIS — R194 Change in bowel habit: Secondary | ICD-10-CM | POA: Diagnosis not present

## 2015-05-28 DIAGNOSIS — G4733 Obstructive sleep apnea (adult) (pediatric): Secondary | ICD-10-CM | POA: Diagnosis not present

## 2015-05-28 DIAGNOSIS — R194 Change in bowel habit: Secondary | ICD-10-CM | POA: Diagnosis not present

## 2015-05-28 DIAGNOSIS — Z23 Encounter for immunization: Secondary | ICD-10-CM | POA: Diagnosis not present

## 2015-06-02 DIAGNOSIS — N6019 Diffuse cystic mastopathy of unspecified breast: Secondary | ICD-10-CM | POA: Diagnosis not present

## 2015-06-02 DIAGNOSIS — M810 Age-related osteoporosis without current pathological fracture: Secondary | ICD-10-CM | POA: Diagnosis not present

## 2015-06-02 DIAGNOSIS — M1991 Primary osteoarthritis, unspecified site: Secondary | ICD-10-CM | POA: Diagnosis not present

## 2015-06-02 DIAGNOSIS — E039 Hypothyroidism, unspecified: Secondary | ICD-10-CM | POA: Diagnosis not present

## 2015-06-02 DIAGNOSIS — Z780 Asymptomatic menopausal state: Secondary | ICD-10-CM | POA: Diagnosis not present

## 2015-06-02 DIAGNOSIS — Z79899 Other long term (current) drug therapy: Secondary | ICD-10-CM | POA: Diagnosis not present

## 2015-06-02 DIAGNOSIS — R7303 Prediabetes: Secondary | ICD-10-CM | POA: Diagnosis not present

## 2015-06-02 DIAGNOSIS — R001 Bradycardia, unspecified: Secondary | ICD-10-CM | POA: Diagnosis not present

## 2015-06-02 DIAGNOSIS — M47814 Spondylosis without myelopathy or radiculopathy, thoracic region: Secondary | ICD-10-CM | POA: Diagnosis not present

## 2015-06-02 DIAGNOSIS — E785 Hyperlipidemia, unspecified: Secondary | ICD-10-CM | POA: Diagnosis not present

## 2015-06-02 DIAGNOSIS — E042 Nontoxic multinodular goiter: Secondary | ICD-10-CM | POA: Diagnosis not present

## 2015-06-02 DIAGNOSIS — E7800 Pure hypercholesterolemia, unspecified: Secondary | ICD-10-CM | POA: Diagnosis not present

## 2015-06-02 DIAGNOSIS — I10 Essential (primary) hypertension: Secondary | ICD-10-CM | POA: Diagnosis not present

## 2015-06-02 DIAGNOSIS — C4491 Basal cell carcinoma of skin, unspecified: Secondary | ICD-10-CM | POA: Diagnosis not present

## 2015-06-16 DIAGNOSIS — Z0001 Encounter for general adult medical examination with abnormal findings: Secondary | ICD-10-CM | POA: Diagnosis not present

## 2015-06-16 DIAGNOSIS — E039 Hypothyroidism, unspecified: Secondary | ICD-10-CM | POA: Diagnosis not present

## 2015-06-16 DIAGNOSIS — I10 Essential (primary) hypertension: Secondary | ICD-10-CM | POA: Diagnosis not present

## 2015-06-16 DIAGNOSIS — F5105 Insomnia due to other mental disorder: Secondary | ICD-10-CM | POA: Diagnosis not present

## 2015-06-16 DIAGNOSIS — E782 Mixed hyperlipidemia: Secondary | ICD-10-CM | POA: Diagnosis not present

## 2015-06-16 DIAGNOSIS — R0902 Hypoxemia: Secondary | ICD-10-CM | POA: Diagnosis not present

## 2015-06-16 DIAGNOSIS — F329 Major depressive disorder, single episode, unspecified: Secondary | ICD-10-CM | POA: Diagnosis not present

## 2015-06-16 DIAGNOSIS — M810 Age-related osteoporosis without current pathological fracture: Secondary | ICD-10-CM | POA: Diagnosis not present

## 2015-06-25 DIAGNOSIS — E2839 Other primary ovarian failure: Secondary | ICD-10-CM | POA: Diagnosis not present

## 2015-06-25 DIAGNOSIS — M859 Disorder of bone density and structure, unspecified: Secondary | ICD-10-CM | POA: Diagnosis not present

## 2015-06-25 DIAGNOSIS — M85852 Other specified disorders of bone density and structure, left thigh: Secondary | ICD-10-CM | POA: Diagnosis not present

## 2015-07-03 DIAGNOSIS — R194 Change in bowel habit: Secondary | ICD-10-CM | POA: Diagnosis not present

## 2015-07-06 DIAGNOSIS — R0902 Hypoxemia: Secondary | ICD-10-CM | POA: Diagnosis not present

## 2015-07-30 DIAGNOSIS — E039 Hypothyroidism, unspecified: Secondary | ICD-10-CM | POA: Diagnosis not present

## 2015-08-21 DIAGNOSIS — H2511 Age-related nuclear cataract, right eye: Secondary | ICD-10-CM | POA: Diagnosis not present

## 2015-08-21 DIAGNOSIS — E042 Nontoxic multinodular goiter: Secondary | ICD-10-CM | POA: Diagnosis not present

## 2015-09-10 DIAGNOSIS — N2889 Other specified disorders of kidney and ureter: Secondary | ICD-10-CM | POA: Diagnosis not present

## 2015-09-10 DIAGNOSIS — I878 Other specified disorders of veins: Secondary | ICD-10-CM | POA: Diagnosis not present

## 2015-09-10 DIAGNOSIS — N200 Calculus of kidney: Secondary | ICD-10-CM | POA: Diagnosis not present

## 2015-09-10 DIAGNOSIS — N390 Urinary tract infection, site not specified: Secondary | ICD-10-CM | POA: Diagnosis not present

## 2015-09-15 DIAGNOSIS — N281 Cyst of kidney, acquired: Secondary | ICD-10-CM | POA: Diagnosis not present

## 2015-09-15 DIAGNOSIS — N200 Calculus of kidney: Secondary | ICD-10-CM | POA: Diagnosis not present

## 2015-09-17 DIAGNOSIS — D1801 Hemangioma of skin and subcutaneous tissue: Secondary | ICD-10-CM | POA: Diagnosis not present

## 2015-09-17 DIAGNOSIS — L720 Epidermal cyst: Secondary | ICD-10-CM | POA: Diagnosis not present

## 2015-09-17 DIAGNOSIS — L821 Other seborrheic keratosis: Secondary | ICD-10-CM | POA: Diagnosis not present

## 2015-09-17 DIAGNOSIS — Z85828 Personal history of other malignant neoplasm of skin: Secondary | ICD-10-CM | POA: Diagnosis not present

## 2015-09-24 DIAGNOSIS — J019 Acute sinusitis, unspecified: Secondary | ICD-10-CM | POA: Diagnosis not present

## 2015-11-03 DIAGNOSIS — I10 Essential (primary) hypertension: Secondary | ICD-10-CM | POA: Diagnosis not present

## 2015-11-03 DIAGNOSIS — R5381 Other malaise: Secondary | ICD-10-CM | POA: Diagnosis not present

## 2015-11-03 DIAGNOSIS — R197 Diarrhea, unspecified: Secondary | ICD-10-CM | POA: Diagnosis not present

## 2015-11-03 DIAGNOSIS — N200 Calculus of kidney: Secondary | ICD-10-CM | POA: Diagnosis not present

## 2015-11-03 DIAGNOSIS — E782 Mixed hyperlipidemia: Secondary | ICD-10-CM | POA: Diagnosis not present

## 2015-11-03 DIAGNOSIS — R350 Frequency of micturition: Secondary | ICD-10-CM | POA: Diagnosis not present

## 2015-11-03 DIAGNOSIS — F5105 Insomnia due to other mental disorder: Secondary | ICD-10-CM | POA: Diagnosis not present

## 2015-11-03 DIAGNOSIS — N390 Urinary tract infection, site not specified: Secondary | ICD-10-CM | POA: Diagnosis not present

## 2015-11-30 DIAGNOSIS — G4733 Obstructive sleep apnea (adult) (pediatric): Secondary | ICD-10-CM | POA: Diagnosis not present

## 2015-11-30 DIAGNOSIS — I279 Pulmonary heart disease, unspecified: Secondary | ICD-10-CM | POA: Diagnosis not present

## 2015-11-30 DIAGNOSIS — R5381 Other malaise: Secondary | ICD-10-CM | POA: Diagnosis not present

## 2015-12-01 DIAGNOSIS — Z719 Counseling, unspecified: Secondary | ICD-10-CM | POA: Insufficient documentation

## 2015-12-16 DIAGNOSIS — R011 Cardiac murmur, unspecified: Secondary | ICD-10-CM | POA: Diagnosis not present

## 2016-01-06 DIAGNOSIS — R5381 Other malaise: Secondary | ICD-10-CM | POA: Diagnosis not present

## 2016-01-06 DIAGNOSIS — E039 Hypothyroidism, unspecified: Secondary | ICD-10-CM | POA: Diagnosis not present

## 2016-01-07 DIAGNOSIS — R309 Painful micturition, unspecified: Secondary | ICD-10-CM | POA: Diagnosis not present

## 2016-01-27 DIAGNOSIS — L821 Other seborrheic keratosis: Secondary | ICD-10-CM | POA: Diagnosis not present

## 2016-02-17 DIAGNOSIS — E042 Nontoxic multinodular goiter: Secondary | ICD-10-CM | POA: Diagnosis not present

## 2016-02-17 DIAGNOSIS — E039 Hypothyroidism, unspecified: Secondary | ICD-10-CM | POA: Diagnosis not present

## 2016-02-17 DIAGNOSIS — N6019 Diffuse cystic mastopathy of unspecified breast: Secondary | ICD-10-CM | POA: Diagnosis not present

## 2016-02-17 DIAGNOSIS — E7800 Pure hypercholesterolemia, unspecified: Secondary | ICD-10-CM | POA: Diagnosis not present

## 2016-02-17 DIAGNOSIS — R269 Unspecified abnormalities of gait and mobility: Secondary | ICD-10-CM | POA: Diagnosis not present

## 2016-02-17 DIAGNOSIS — M810 Age-related osteoporosis without current pathological fracture: Secondary | ICD-10-CM | POA: Diagnosis not present

## 2016-02-17 DIAGNOSIS — Z780 Asymptomatic menopausal state: Secondary | ICD-10-CM | POA: Diagnosis not present

## 2016-02-17 DIAGNOSIS — I6781 Acute cerebrovascular insufficiency: Secondary | ICD-10-CM | POA: Diagnosis not present

## 2016-02-17 DIAGNOSIS — C4491 Basal cell carcinoma of skin, unspecified: Secondary | ICD-10-CM | POA: Diagnosis not present

## 2016-02-17 DIAGNOSIS — Z136 Encounter for screening for cardiovascular disorders: Secondary | ICD-10-CM | POA: Diagnosis not present

## 2016-02-17 DIAGNOSIS — R197 Diarrhea, unspecified: Secondary | ICD-10-CM | POA: Diagnosis not present

## 2016-02-17 DIAGNOSIS — R7303 Prediabetes: Secondary | ICD-10-CM | POA: Diagnosis not present

## 2016-02-17 DIAGNOSIS — N83209 Unspecified ovarian cyst, unspecified side: Secondary | ICD-10-CM | POA: Diagnosis not present

## 2016-02-17 DIAGNOSIS — I10 Essential (primary) hypertension: Secondary | ICD-10-CM | POA: Diagnosis not present

## 2016-02-26 DIAGNOSIS — E039 Hypothyroidism, unspecified: Secondary | ICD-10-CM | POA: Diagnosis not present

## 2016-02-26 DIAGNOSIS — E042 Nontoxic multinodular goiter: Secondary | ICD-10-CM | POA: Diagnosis not present

## 2016-02-26 DIAGNOSIS — F4323 Adjustment disorder with mixed anxiety and depressed mood: Secondary | ICD-10-CM | POA: Insufficient documentation

## 2016-02-27 DIAGNOSIS — M19072 Primary osteoarthritis, left ankle and foot: Secondary | ICD-10-CM | POA: Diagnosis not present

## 2016-02-27 DIAGNOSIS — M79672 Pain in left foot: Secondary | ICD-10-CM | POA: Diagnosis not present

## 2016-02-27 DIAGNOSIS — M25475 Effusion, left foot: Secondary | ICD-10-CM | POA: Diagnosis not present

## 2016-03-02 DIAGNOSIS — M9901 Segmental and somatic dysfunction of cervical region: Secondary | ICD-10-CM | POA: Diagnosis not present

## 2016-03-02 DIAGNOSIS — M542 Cervicalgia: Secondary | ICD-10-CM | POA: Diagnosis not present

## 2016-03-02 DIAGNOSIS — M9904 Segmental and somatic dysfunction of sacral region: Secondary | ICD-10-CM | POA: Diagnosis not present

## 2016-03-02 DIAGNOSIS — M5417 Radiculopathy, lumbosacral region: Secondary | ICD-10-CM | POA: Diagnosis not present

## 2016-03-02 DIAGNOSIS — M545 Low back pain: Secondary | ICD-10-CM | POA: Diagnosis not present

## 2016-03-02 DIAGNOSIS — M5413 Radiculopathy, cervicothoracic region: Secondary | ICD-10-CM | POA: Diagnosis not present

## 2016-03-02 DIAGNOSIS — M791 Myalgia: Secondary | ICD-10-CM | POA: Diagnosis not present

## 2016-03-02 DIAGNOSIS — M9903 Segmental and somatic dysfunction of lumbar region: Secondary | ICD-10-CM | POA: Diagnosis not present

## 2016-03-08 DIAGNOSIS — M79672 Pain in left foot: Secondary | ICD-10-CM | POA: Diagnosis not present

## 2016-03-08 DIAGNOSIS — M84375A Stress fracture, left foot, initial encounter for fracture: Secondary | ICD-10-CM | POA: Diagnosis not present

## 2016-03-16 DIAGNOSIS — N3000 Acute cystitis without hematuria: Secondary | ICD-10-CM | POA: Diagnosis not present

## 2016-03-16 DIAGNOSIS — K529 Noninfective gastroenteritis and colitis, unspecified: Secondary | ICD-10-CM | POA: Diagnosis not present

## 2016-03-16 DIAGNOSIS — E039 Hypothyroidism, unspecified: Secondary | ICD-10-CM | POA: Diagnosis not present

## 2016-03-16 DIAGNOSIS — E042 Nontoxic multinodular goiter: Secondary | ICD-10-CM | POA: Diagnosis not present

## 2016-03-16 DIAGNOSIS — R197 Diarrhea, unspecified: Secondary | ICD-10-CM | POA: Diagnosis not present

## 2016-03-21 DIAGNOSIS — M79672 Pain in left foot: Secondary | ICD-10-CM | POA: Diagnosis not present

## 2016-03-23 DIAGNOSIS — K529 Noninfective gastroenteritis and colitis, unspecified: Secondary | ICD-10-CM | POA: Diagnosis not present

## 2016-04-21 DIAGNOSIS — R3129 Other microscopic hematuria: Secondary | ICD-10-CM | POA: Diagnosis not present

## 2016-04-21 DIAGNOSIS — N200 Calculus of kidney: Secondary | ICD-10-CM | POA: Diagnosis not present

## 2016-04-21 DIAGNOSIS — N390 Urinary tract infection, site not specified: Secondary | ICD-10-CM | POA: Diagnosis not present

## 2016-04-25 DIAGNOSIS — E7800 Pure hypercholesterolemia, unspecified: Secondary | ICD-10-CM | POA: Diagnosis not present

## 2016-04-25 DIAGNOSIS — A047 Enterocolitis due to Clostridium difficile: Secondary | ICD-10-CM | POA: Diagnosis not present

## 2016-04-25 DIAGNOSIS — I10 Essential (primary) hypertension: Secondary | ICD-10-CM | POA: Diagnosis not present

## 2016-04-25 DIAGNOSIS — E039 Hypothyroidism, unspecified: Secondary | ICD-10-CM | POA: Diagnosis not present

## 2016-04-25 DIAGNOSIS — S92335D Nondisplaced fracture of third metatarsal bone, left foot, subsequent encounter for fracture with routine healing: Secondary | ICD-10-CM | POA: Diagnosis not present

## 2016-04-27 DIAGNOSIS — K5730 Diverticulosis of large intestine without perforation or abscess without bleeding: Secondary | ICD-10-CM | POA: Diagnosis not present

## 2016-04-27 DIAGNOSIS — N202 Calculus of kidney with calculus of ureter: Secondary | ICD-10-CM | POA: Diagnosis not present

## 2016-04-27 DIAGNOSIS — K8020 Calculus of gallbladder without cholecystitis without obstruction: Secondary | ICD-10-CM | POA: Diagnosis not present

## 2016-04-27 DIAGNOSIS — N200 Calculus of kidney: Secondary | ICD-10-CM | POA: Diagnosis not present

## 2016-05-10 DIAGNOSIS — Z803 Family history of malignant neoplasm of breast: Secondary | ICD-10-CM | POA: Diagnosis not present

## 2016-05-10 DIAGNOSIS — Z1231 Encounter for screening mammogram for malignant neoplasm of breast: Secondary | ICD-10-CM | POA: Diagnosis not present

## 2016-05-16 DIAGNOSIS — S92335D Nondisplaced fracture of third metatarsal bone, left foot, subsequent encounter for fracture with routine healing: Secondary | ICD-10-CM | POA: Diagnosis not present

## 2016-05-16 DIAGNOSIS — A0472 Enterocolitis due to Clostridium difficile, not specified as recurrent: Secondary | ICD-10-CM | POA: Diagnosis not present

## 2016-05-21 DIAGNOSIS — Z23 Encounter for immunization: Secondary | ICD-10-CM | POA: Diagnosis not present

## 2016-06-01 DIAGNOSIS — A0472 Enterocolitis due to Clostridium difficile, not specified as recurrent: Secondary | ICD-10-CM | POA: Diagnosis not present

## 2016-06-08 DIAGNOSIS — H6123 Impacted cerumen, bilateral: Secondary | ICD-10-CM | POA: Diagnosis not present

## 2016-06-08 DIAGNOSIS — H903 Sensorineural hearing loss, bilateral: Secondary | ICD-10-CM | POA: Diagnosis not present

## 2016-06-27 DIAGNOSIS — M79672 Pain in left foot: Secondary | ICD-10-CM | POA: Diagnosis not present

## 2016-06-27 DIAGNOSIS — M2042 Other hammer toe(s) (acquired), left foot: Secondary | ICD-10-CM | POA: Diagnosis not present

## 2016-06-27 DIAGNOSIS — M2041 Other hammer toe(s) (acquired), right foot: Secondary | ICD-10-CM | POA: Diagnosis not present

## 2016-06-28 DIAGNOSIS — I10 Essential (primary) hypertension: Secondary | ICD-10-CM | POA: Diagnosis not present

## 2016-06-28 DIAGNOSIS — E039 Hypothyroidism, unspecified: Secondary | ICD-10-CM | POA: Diagnosis not present

## 2016-06-28 DIAGNOSIS — S161XXA Strain of muscle, fascia and tendon at neck level, initial encounter: Secondary | ICD-10-CM | POA: Diagnosis not present

## 2016-06-28 DIAGNOSIS — Z131 Encounter for screening for diabetes mellitus: Secondary | ICD-10-CM | POA: Diagnosis not present

## 2016-06-28 DIAGNOSIS — F325 Major depressive disorder, single episode, in full remission: Secondary | ICD-10-CM | POA: Diagnosis not present

## 2016-06-28 DIAGNOSIS — M8589 Other specified disorders of bone density and structure, multiple sites: Secondary | ICD-10-CM | POA: Diagnosis not present

## 2016-06-28 DIAGNOSIS — E7800 Pure hypercholesterolemia, unspecified: Secondary | ICD-10-CM | POA: Diagnosis not present

## 2016-07-21 DIAGNOSIS — H2511 Age-related nuclear cataract, right eye: Secondary | ICD-10-CM | POA: Diagnosis not present

## 2016-08-03 DIAGNOSIS — R5382 Chronic fatigue, unspecified: Secondary | ICD-10-CM | POA: Diagnosis not present

## 2016-08-03 DIAGNOSIS — R300 Dysuria: Secondary | ICD-10-CM | POA: Diagnosis not present

## 2016-08-03 DIAGNOSIS — E039 Hypothyroidism, unspecified: Secondary | ICD-10-CM | POA: Diagnosis not present

## 2016-08-03 DIAGNOSIS — R197 Diarrhea, unspecified: Secondary | ICD-10-CM | POA: Diagnosis not present

## 2016-08-03 DIAGNOSIS — R05 Cough: Secondary | ICD-10-CM | POA: Diagnosis not present

## 2016-08-03 DIAGNOSIS — I10 Essential (primary) hypertension: Secondary | ICD-10-CM | POA: Diagnosis not present

## 2016-08-30 DIAGNOSIS — R350 Frequency of micturition: Secondary | ICD-10-CM | POA: Diagnosis not present

## 2016-08-31 DIAGNOSIS — E039 Hypothyroidism, unspecified: Secondary | ICD-10-CM | POA: Diagnosis not present

## 2016-08-31 DIAGNOSIS — E041 Nontoxic single thyroid nodule: Secondary | ICD-10-CM | POA: Diagnosis not present

## 2016-09-16 DIAGNOSIS — Z08 Encounter for follow-up examination after completed treatment for malignant neoplasm: Secondary | ICD-10-CM | POA: Diagnosis not present

## 2016-09-16 DIAGNOSIS — L718 Other rosacea: Secondary | ICD-10-CM | POA: Diagnosis not present

## 2016-09-16 DIAGNOSIS — Z85828 Personal history of other malignant neoplasm of skin: Secondary | ICD-10-CM | POA: Diagnosis not present

## 2016-09-28 DIAGNOSIS — I10 Essential (primary) hypertension: Secondary | ICD-10-CM | POA: Diagnosis not present

## 2016-09-28 DIAGNOSIS — Z131 Encounter for screening for diabetes mellitus: Secondary | ICD-10-CM | POA: Diagnosis not present

## 2016-09-28 DIAGNOSIS — E039 Hypothyroidism, unspecified: Secondary | ICD-10-CM | POA: Diagnosis not present

## 2016-09-28 DIAGNOSIS — E7800 Pure hypercholesterolemia, unspecified: Secondary | ICD-10-CM | POA: Diagnosis not present

## 2016-10-03 DIAGNOSIS — J208 Acute bronchitis due to other specified organisms: Secondary | ICD-10-CM | POA: Diagnosis not present

## 2016-10-03 DIAGNOSIS — M62838 Other muscle spasm: Secondary | ICD-10-CM | POA: Diagnosis not present

## 2016-10-03 DIAGNOSIS — I10 Essential (primary) hypertension: Secondary | ICD-10-CM | POA: Diagnosis not present

## 2016-10-03 DIAGNOSIS — G4733 Obstructive sleep apnea (adult) (pediatric): Secondary | ICD-10-CM | POA: Diagnosis not present

## 2016-10-03 DIAGNOSIS — E039 Hypothyroidism, unspecified: Secondary | ICD-10-CM | POA: Diagnosis not present

## 2016-10-03 DIAGNOSIS — B9689 Other specified bacterial agents as the cause of diseases classified elsewhere: Secondary | ICD-10-CM | POA: Diagnosis not present

## 2016-10-26 DIAGNOSIS — G4733 Obstructive sleep apnea (adult) (pediatric): Secondary | ICD-10-CM | POA: Diagnosis not present

## 2016-10-30 DIAGNOSIS — G4733 Obstructive sleep apnea (adult) (pediatric): Secondary | ICD-10-CM | POA: Diagnosis not present

## 2016-11-21 DIAGNOSIS — H900 Conductive hearing loss, bilateral: Secondary | ICD-10-CM | POA: Diagnosis not present

## 2016-11-21 DIAGNOSIS — H6123 Impacted cerumen, bilateral: Secondary | ICD-10-CM | POA: Diagnosis not present

## 2016-12-15 DIAGNOSIS — G4733 Obstructive sleep apnea (adult) (pediatric): Secondary | ICD-10-CM | POA: Diagnosis not present

## 2016-12-15 DIAGNOSIS — R300 Dysuria: Secondary | ICD-10-CM | POA: Diagnosis not present

## 2016-12-15 DIAGNOSIS — F325 Major depressive disorder, single episode, in full remission: Secondary | ICD-10-CM | POA: Diagnosis not present

## 2016-12-19 DIAGNOSIS — H2511 Age-related nuclear cataract, right eye: Secondary | ICD-10-CM | POA: Diagnosis not present

## 2016-12-27 DIAGNOSIS — H52221 Regular astigmatism, right eye: Secondary | ICD-10-CM | POA: Diagnosis not present

## 2016-12-27 DIAGNOSIS — H2511 Age-related nuclear cataract, right eye: Secondary | ICD-10-CM | POA: Diagnosis not present

## 2017-02-01 DIAGNOSIS — R918 Other nonspecific abnormal finding of lung field: Secondary | ICD-10-CM | POA: Diagnosis not present

## 2017-02-01 DIAGNOSIS — N200 Calculus of kidney: Secondary | ICD-10-CM | POA: Diagnosis not present

## 2017-02-09 DIAGNOSIS — Z9841 Cataract extraction status, right eye: Secondary | ICD-10-CM | POA: Diagnosis not present

## 2017-02-09 DIAGNOSIS — N6019 Diffuse cystic mastopathy of unspecified breast: Secondary | ICD-10-CM | POA: Diagnosis not present

## 2017-02-09 DIAGNOSIS — E042 Nontoxic multinodular goiter: Secondary | ICD-10-CM | POA: Diagnosis not present

## 2017-02-09 DIAGNOSIS — Z780 Asymptomatic menopausal state: Secondary | ICD-10-CM | POA: Diagnosis not present

## 2017-02-09 DIAGNOSIS — I10 Essential (primary) hypertension: Secondary | ICD-10-CM | POA: Diagnosis not present

## 2017-02-09 DIAGNOSIS — R7303 Prediabetes: Secondary | ICD-10-CM | POA: Diagnosis not present

## 2017-02-09 DIAGNOSIS — M810 Age-related osteoporosis without current pathological fracture: Secondary | ICD-10-CM | POA: Diagnosis not present

## 2017-02-09 DIAGNOSIS — Z136 Encounter for screening for cardiovascular disorders: Secondary | ICD-10-CM | POA: Diagnosis not present

## 2017-02-09 DIAGNOSIS — N3000 Acute cystitis without hematuria: Secondary | ICD-10-CM | POA: Diagnosis not present

## 2017-02-09 DIAGNOSIS — N83209 Unspecified ovarian cyst, unspecified side: Secondary | ICD-10-CM | POA: Diagnosis not present

## 2017-02-09 DIAGNOSIS — Z961 Presence of intraocular lens: Secondary | ICD-10-CM | POA: Diagnosis not present

## 2017-02-09 DIAGNOSIS — E7800 Pure hypercholesterolemia, unspecified: Secondary | ICD-10-CM | POA: Diagnosis not present

## 2017-02-09 DIAGNOSIS — E039 Hypothyroidism, unspecified: Secondary | ICD-10-CM | POA: Diagnosis not present

## 2017-02-25 DIAGNOSIS — F418 Other specified anxiety disorders: Secondary | ICD-10-CM | POA: Insufficient documentation

## 2017-03-01 DIAGNOSIS — K529 Noninfective gastroenteritis and colitis, unspecified: Secondary | ICD-10-CM | POA: Diagnosis not present

## 2017-03-01 DIAGNOSIS — E042 Nontoxic multinodular goiter: Secondary | ICD-10-CM | POA: Diagnosis not present

## 2017-03-29 DIAGNOSIS — E042 Nontoxic multinodular goiter: Secondary | ICD-10-CM | POA: Diagnosis not present

## 2017-04-19 DIAGNOSIS — K30 Functional dyspepsia: Secondary | ICD-10-CM | POA: Diagnosis not present

## 2017-04-19 DIAGNOSIS — R197 Diarrhea, unspecified: Secondary | ICD-10-CM | POA: Diagnosis not present

## 2017-04-19 DIAGNOSIS — Z8619 Personal history of other infectious and parasitic diseases: Secondary | ICD-10-CM | POA: Diagnosis not present

## 2017-04-20 DIAGNOSIS — R300 Dysuria: Secondary | ICD-10-CM | POA: Diagnosis not present

## 2017-05-09 DIAGNOSIS — Z23 Encounter for immunization: Secondary | ICD-10-CM | POA: Diagnosis not present

## 2017-05-11 DIAGNOSIS — Z1231 Encounter for screening mammogram for malignant neoplasm of breast: Secondary | ICD-10-CM | POA: Diagnosis not present

## 2017-05-11 DIAGNOSIS — Z803 Family history of malignant neoplasm of breast: Secondary | ICD-10-CM | POA: Diagnosis not present

## 2017-05-16 DIAGNOSIS — N390 Urinary tract infection, site not specified: Secondary | ICD-10-CM | POA: Diagnosis not present

## 2017-07-11 DIAGNOSIS — Z8619 Personal history of other infectious and parasitic diseases: Secondary | ICD-10-CM | POA: Diagnosis not present

## 2017-07-17 DIAGNOSIS — I10 Essential (primary) hypertension: Secondary | ICD-10-CM | POA: Diagnosis not present

## 2017-07-17 DIAGNOSIS — G4733 Obstructive sleep apnea (adult) (pediatric): Secondary | ICD-10-CM | POA: Diagnosis not present

## 2017-07-17 DIAGNOSIS — E039 Hypothyroidism, unspecified: Secondary | ICD-10-CM | POA: Diagnosis not present

## 2017-07-17 DIAGNOSIS — Z9989 Dependence on other enabling machines and devices: Secondary | ICD-10-CM | POA: Diagnosis not present

## 2017-07-17 DIAGNOSIS — F325 Major depressive disorder, single episode, in full remission: Secondary | ICD-10-CM | POA: Diagnosis not present

## 2017-07-18 DIAGNOSIS — F325 Major depressive disorder, single episode, in full remission: Secondary | ICD-10-CM

## 2017-07-18 HISTORY — DX: Major depressive disorder, single episode, in full remission: F32.5

## 2017-07-26 DIAGNOSIS — J340 Abscess, furuncle and carbuncle of nose: Secondary | ICD-10-CM | POA: Diagnosis not present

## 2017-07-26 DIAGNOSIS — H6123 Impacted cerumen, bilateral: Secondary | ICD-10-CM | POA: Diagnosis not present

## 2017-07-26 DIAGNOSIS — R42 Dizziness and giddiness: Secondary | ICD-10-CM | POA: Diagnosis not present

## 2017-07-26 DIAGNOSIS — R5381 Other malaise: Secondary | ICD-10-CM | POA: Diagnosis not present

## 2017-07-26 DIAGNOSIS — R5383 Other fatigue: Secondary | ICD-10-CM | POA: Diagnosis not present

## 2017-08-04 ENCOUNTER — Ambulatory Visit (INDEPENDENT_AMBULATORY_CARE_PROVIDER_SITE_OTHER): Payer: Medicare Other | Admitting: Urology

## 2017-08-04 ENCOUNTER — Encounter: Payer: Self-pay | Admitting: Urology

## 2017-08-04 VITALS — BP 158/77 | HR 98 | Ht 62.0 in | Wt 143.0 lb

## 2017-08-04 DIAGNOSIS — R350 Frequency of micturition: Secondary | ICD-10-CM | POA: Diagnosis not present

## 2017-08-04 LAB — URINALYSIS, COMPLETE
Bilirubin, UA: NEGATIVE
Glucose, UA: NEGATIVE
Leukocytes, UA: NEGATIVE
Nitrite, UA: NEGATIVE
RBC, UA: NEGATIVE
Specific Gravity, UA: 1.02 (ref 1.005–1.030)
Urobilinogen, Ur: 0.2 mg/dL (ref 0.2–1.0)
pH, UA: 6 (ref 5.0–7.5)

## 2017-08-04 LAB — BLADDER SCAN AMB NON-IMAGING

## 2017-08-05 ENCOUNTER — Encounter: Payer: Self-pay | Admitting: Urology

## 2017-08-05 NOTE — Progress Notes (Signed)
08/04/2017 8:07 AM   [name] October 06, 1926 [id number]  Referring provider: No referring provider defined for this encounter.  Chief Complaint  Patient presents with  . Urinary Frequency    HPI: 83 y.o. female who I have previously seen at [facility] for nephrolithiasis and recurrent UTI.  She presents for evaluation of a possible UTI.  The past several days she has noted nocturia x1-2 with mild dysuria.  Since the weekend and Christmas holiday is approaching she wanted to make sure she did not have an infection.  She denies fever, chills or gross hematuria.   PMH: Past Medical History:  Diagnosis Date  . Asymptomatic menopausal state 02/20/1984   Overview:  Naturally occuring and asymptomatic Vaginal atrophy present on topical estrogen Overview:  Overview:  Naturally occuring and asymptomatic Vaginal atrophy present on topical estrogen  . Basal cell carcinoma of skin 02/19/2013   Overview:  Basal cell on her nasolabial fold 2014 S/p Mohs by Dr. Lacinda Axon Followed by local dermatologist regularly Overview:  Overview:  Followed by Dr. Lacinda Axon  . Cyst of ovary 02/19/2009   Overview:  Ovarian cyst on right stable 2004-2010 Cyst ? larger on CT 2012 Stable on follow up US Followed by Dr. Edwinna Areola Overview:  Overview:  Ovarian cyst on right stable 2004-2010 Cyst ? larger on CT 2012 Stable on follow up US Followed by Dr. Edwinna Areola  . Depression, major, single episode, complete remission ([place]) 07/18/2017  . Diffuse cystic mastopathy of breast 02/19/2013   Overview:  Mother with breast cancer later in life Two breast biopsies both with normal pathology Annual mammograms recommended, last 2016 Followed at the [facility]:  Relevant Hx: Course: Daily Update: Today's Plan: Overview:  Overview:  Mother with breast cancer later in life Two breast biopsies both with normal pathology Annual mammograms recommended, last 61  . Essential (primary) hypertension 02/19/2013   Overview:  Goal blood pressure < 135/85 On atenolol Overview:  Overview:  Goal blood pressure < 135/85 On atenolol  . Hyperlipidemia 02/19/2013   Overview:  Goal LDL < 130, last value 92 in 2017 On Pravachol Overview:  Overview:  Goal LDL < 130, last value 82 in 2012 On Pravachol  . Hypothyroidism 02/28/2012   Overview:  started on T4  TFT normal 2017 Followed by Dr. Almon Hercules Overview:  Overview:  started on T4  TFT normal 2014 Followed by Dr. Almon Hercules  . Impaired glucose tolerance 02/20/2003   Overview:  Overview:  Glucose goal < 100, last value 94 in 2014 HgbA1c goal < 6.0, last value 5.3 in 2014 Urine microalbumin negative in 2014 On diet and exercise  . Nontoxic multinodular goiter 03/06/2003   Overview:  Asymptomatic nodular thyroid Stable [place] 2004-2014 Followed by Dr. Almon Hercules Overview:  Overview:  Asymptomatic nodular thyroid Stable [place] 2004-2014 Followed by Dr. Almon Hercules  . OSA on CPAP 01/13/2015   Overview:  Sleep study sent to medical records:   Sleep efficiency 22%, latency 24 min, # awakenings 11, res desat N=10, index 17.8  Apnea 1 for 16 sec, hypopnea 16 for 18 sec, total sleep time only 90 min so index = 11 She did not sleep much and the index puts her in mild category Repeat study a few days later when she slept 187 min her AHI = 4.6 which is normal, average O2 sat 94% I don't think   . Osteoarthritis 02/20/2003   Overview:  Mild bilateral hands Right CMC Right hip Left knee  s/p meniscus tear All symptomatically stable Overview:  Overview:  Mild bilateral hands Right CMC Right hip Left knee s/p meniscus tear All symptomatically stable  . Osteoporosis 02/20/2003   Overview:  Bone density 2004 LS -3.32 and RH-1.62 Bone density 2008 LS -3.14 and RH -1.22 Bone density 2009 LS -3.17 and RH -1.21 Bone density 2010 LS -3.14 and RH -1.42  Bone density 2014 LS -2.7 (L3 -3.1) and RH -1.5 Bone density 2017 LS -2.3 and RH -1.4 (neck -1.7) Vitamin D level 38 in 2012 On Boniva 2004 to 2010, restarted 2015 Repeat  bone density in 2020 and stop Boniva  Last Assessment & Pl  . Prediabetes 02/20/2003   Overview:  Glucose goal < 100, last value 91 in 2017 HgbA1c goal < 6.0, last value 5.6 in 2017 Urine microalbumin negative in 2017 On diet and exercise  . Recurrent UTI 10/19/2014    Surgical History: Past Surgical History:  Procedure Laterality Date  . ABDOMINAL HYSTERECTOMY  1975  . URETEROSCOPY    . URETEROSCOPY WITH HOLMIUM LASER LITHOTRIPSY       Home Medications:  Allergies as of 08/04/2017      Reactions   Penicillins Hives   Other reaction(s): Unknown   Ramipril    Other reaction(s): Cough Other reaction(s): Cough      Medication List        Accurate as of 08/04/17 11:59 PM. Always use your most recent med list.          amLODipine 2.5 MG tablet Commonly known as:  NORVASC   atenolol 25 MG tablet Commonly known as:  TENORMIN   clorazepate 7.5 MG tablet Commonly known as:  TRANXENE   escitalopram 10 MG tablet Commonly known as:  LEXAPRO TAKE 1 TABLET BY MOUTH DAILY   ibandronate 150 MG tablet Commonly known as:  BONIVA   lansoprazole 30 MG capsule Commonly known as:  PREVACID Take 30 mg by mouth.   levothyroxine 75 MCG tablet Commonly known as:  SYNTHROID, LEVOTHROID   multivitamin capsule Take by mouth.   pravastatin 40 MG tablet Commonly known as:  PRAVACHOL   RA CALCIUM 600/VIT D/MINERALS 600-200 MG-UNIT Tabs one daily   SEREVENT DISKUS 50 MCG/DOSE diskus inhaler Generic drug:  salmeterol INHALE ONE INHALATION INTO THE LUNGS NIGHTLY       Allergies:  Allergies  Allergen Reactions  . Penicillins Hives    Other reaction(s): Unknown  . Ramipril     Other reaction(s): Cough Other reaction(s): Cough     Family History: Family History  Problem Relation Age of Onset  . Breast cancer Mother   . Skin cancer Father     Social History:  reports that  has never smoked. she has never used smokeless tobacco. She reports that she does not drink alcohol  or use drugs.  ROS: UROLOGY Frequent Urination?: No Hard to postpone urination?: No Burning/pain with urination?: No Get up at night to urinate?: Yes Leakage of urine?: No Urine stream starts and stops?: No Trouble starting stream?: No Do you have to strain to urinate?: No Blood in urine?: No Urinary tract infection?: No Sexually transmitted disease?: No Injury to kidneys or bladder?: No Painful intercourse?: No Weak stream?: No Currently pregnant?: No Vaginal bleeding?: No Last menstrual period?: n  Gastrointestinal Nausea?: No Vomiting?: No Indigestion/heartburn?: Yes Diarrhea?: No Constipation?: No  Constitutional Fever: No Night sweats?: No Weight loss?: No Fatigue?: No  Skin Skin rash/lesions?: No Itching?: Yes  Eyes Blurred vision?: No Double vision?:  No  Ears/Nose/Throat Sore throat?: No Sinus problems?: No  Hematologic/Lymphatic Swollen glands?: No Easy bruising?: No  Cardiovascular Leg swelling?: No Chest pain?: No  Respiratory Cough?: No Shortness of breath?: No  Endocrine Excessive thirst?: No  Musculoskeletal Back pain?: Yes Joint pain?: No  Neurological Headaches?: No Dizziness?: Yes  Psychologic Depression?: No Anxiety?: Yes  Physical Exam: BP (!) 158/77   Pulse 98   Ht 5\' 2"  (1.575 m)   Wt 143 lb (64.9 kg)   BMI 26.16 kg/m   Constitutional:  Alert and oriented, No acute distress. HEENT: [place] AT, moist mucus membranes.  Trachea midline, no masses. Cardiovascular: No clubbing, cyanosis, or edema. Respiratory: Normal respiratory effort, no increased work of breathing. GI: Abdomen is soft, nontender, nondistended, no abdominal masses GU: No CVA tenderness.  Skin: No rashes, bruises or suspicious lesions. Lymph: No cervical or inguinal adenopathy. Neurologic: Grossly intact, no focal deficits, moving all 4 extremities. Psychiatric: Normal mood and affect.  Laboratory Data:  Urinalysis Lab Results  Component Value  Date   SPECGRAV 1.020 08/04/2017   PHUR 6.0 08/04/2017   COLORU Yellow 08/04/2017   APPEARANCEUR Clear 08/04/2017   LEUKOCYTESUR Negative 08/04/2017   PROTEINUR Trace (A) 08/04/2017   GLUCOSEU Negative 08/04/2017   KETONESU Trace (A) 08/04/2017   RBCU Negative 08/04/2017   BILIRUBINUR Negative 08/04/2017   UUROB 0.2 08/04/2017   NITRITE Negative 08/04/2017   Microscopy: Negative    Assessment & Plan:    1. Urinary frequency She was reassured there is no evidence of urinary tract infection.  Her voiding symptoms not bothersome.  PVR by bladder scan was 0 mL.  She will be due for follow-up in June 2018 for KUB and was instructed to call earlier for any change in her symptoms.  - Urinalysis, Complete - BLADDER SCAN AMB NON-IMAGING   Abbie Sons, MD   [facility] [address], [place], [place] [address] [phone]

## 2017-09-13 ENCOUNTER — Ambulatory Visit (INDEPENDENT_AMBULATORY_CARE_PROVIDER_SITE_OTHER): Payer: Medicare Other

## 2017-09-13 VITALS — BP 130/86 | HR 98 | Ht 62.0 in | Wt 144.0 lb

## 2017-09-13 DIAGNOSIS — R300 Dysuria: Secondary | ICD-10-CM | POA: Diagnosis not present

## 2017-09-13 LAB — URINALYSIS, COMPLETE
Bilirubin, UA: NEGATIVE
Glucose, UA: NEGATIVE
Nitrite, UA: POSITIVE — AB
Specific Gravity, UA: 1.025 (ref 1.005–1.030)
Urobilinogen, Ur: 0.2 mg/dL (ref 0.2–1.0)
pH, UA: 5.5 (ref 5.0–7.5)

## 2017-09-13 LAB — MICROSCOPIC EXAMINATION
Epithelial Cells (non renal): NONE SEEN /hpf (ref 0–10)
WBC, UA: >30 /hpf — ABNORMAL HIGH (ref 0–?)

## 2017-09-13 MED ORDER — CEFUROXIME AXETIL 500 MG PO TABS: 500.0000 mg | ORAL_TABLET | Freq: Two times a day (BID) | ORAL | 0 refills | Status: DC

## 2017-09-13 NOTE — Addendum Note (Signed)
Addended by: Toniann Fail C on: 09/13/2017 11:07 AM   Modules accepted: Orders

## 2017-09-13 NOTE — Progress Notes (Addendum)
Pt presents today with c/o urinary frequency, hard to postpone urination, dysuria, leakage of urine, and nocturia out of the norm. A clean catch was obtained for u/a and cx.  Blood pressure 130/86, pulse 98, height 5\' 2"  (1.575 m), weight 144 lb (65.3 kg).  Per Dr. Bernardo Heater pt was given ceftin 500mg  bid x7.

## 2017-09-16 LAB — CULTURE, URINE COMPREHENSIVE

## 2017-09-18 ENCOUNTER — Telehealth: Payer: Self-pay | Admitting: Family Medicine

## 2017-09-18 NOTE — Telephone Encounter (Signed)
Patient notified and will call to make a lab appointment for UA drop off 1 week after completing her ABX.

## 2017-09-18 NOTE — Telephone Encounter (Signed)
-----   Message from Abbie Sons, MD sent at 09/17/2017  9:35 AM EST ----- Urine culture was positive and intermediate to Ceftin.  If her symptoms have improved would recommend she complete her antibiotic course and obtain a follow-up UA approximately 1 week after completing therapy

## 2017-09-27 ENCOUNTER — Ambulatory Visit (INDEPENDENT_AMBULATORY_CARE_PROVIDER_SITE_OTHER): Payer: Medicare Other

## 2017-09-27 VITALS — BP 156/94 | HR 85 | Ht 62.0 in | Wt 140.0 lb

## 2017-09-27 DIAGNOSIS — N390 Urinary tract infection, site not specified: Secondary | ICD-10-CM | POA: Diagnosis not present

## 2017-09-27 LAB — URINALYSIS, COMPLETE
Bilirubin, UA: NEGATIVE
Glucose, UA: NEGATIVE
Leukocytes, UA: NEGATIVE
Nitrite, UA: NEGATIVE
Protein, UA: NEGATIVE
RBC, UA: NEGATIVE
Specific Gravity, UA: 1.025 (ref 1.005–1.030)
Urobilinogen, Ur: 0.2 mg/dL (ref 0.2–1.0)
pH, UA: 5.5 (ref 5.0–7.5)

## 2017-09-27 LAB — MICROSCOPIC EXAMINATION
Bacteria, UA: NONE SEEN
RBC, UA: NONE SEEN /hpf (ref 0–?)

## 2017-09-27 NOTE — Progress Notes (Signed)
Pt presents today with c/o urinary frequency and leakage of urine. A clean catch was obtained for u/a and cx.   Blood pressure (!) 156/94, pulse 85, height 5\' 2"  (1.575 m), weight 140 lb (63.5 kg).

## 2017-10-01 LAB — CULTURE, URINE COMPREHENSIVE

## 2017-10-04 ENCOUNTER — Telehealth: Payer: Self-pay

## 2017-10-04 NOTE — Telephone Encounter (Signed)
-----   Message from Abbie Sons, MD sent at 10/04/2017  8:45 AM EST ----- Urine culture had no significant growth.  If she is still having symptoms would recommend repeating the culture

## 2017-10-04 NOTE — Telephone Encounter (Signed)
Spoke with pt in reference to ucx results. Pt stated that she is doing better.  Pt requested that ceftin be put in her chart as an intolerance due to GI distress. Medication added.

## 2017-11-29 ENCOUNTER — Encounter: Payer: Self-pay | Admitting: Family Medicine

## 2017-11-29 ENCOUNTER — Ambulatory Visit (INDEPENDENT_AMBULATORY_CARE_PROVIDER_SITE_OTHER): Payer: Medicare Other | Admitting: Family Medicine

## 2017-11-29 VITALS — BP 150/95 | HR 103 | Ht 62.0 in | Wt 140.0 lb

## 2017-11-29 DIAGNOSIS — N390 Urinary tract infection, site not specified: Secondary | ICD-10-CM

## 2017-11-29 LAB — MICROSCOPIC EXAMINATION
Epithelial Cells (non renal): NONE SEEN /hpf (ref 0–10)
RBC, UA: NONE SEEN /hpf (ref 0–2)
WBC, UA: NONE SEEN /hpf (ref 0–5)

## 2017-11-29 LAB — URINALYSIS, COMPLETE
Bilirubin, UA: NEGATIVE
Glucose, UA: NEGATIVE
Ketones, UA: NEGATIVE
Leukocytes, UA: NEGATIVE
Nitrite, UA: NEGATIVE
Protein, UA: NEGATIVE
RBC, UA: NEGATIVE
Specific Gravity, UA: 1.015 (ref 1.005–1.030)
Urobilinogen, Ur: 0.2 mg/dL (ref 0.2–1.0)
pH, UA: 6 (ref 5.0–7.5)

## 2017-11-29 NOTE — Progress Notes (Signed)
Patient presents today with dysuria and frequency. The symptoms started about 1 week ago. She has not had any Urological surgeries, no ABX in the last 30 days. A Urine was collected for UA, UCX.

## 2017-12-02 LAB — CULTURE, URINE COMPREHENSIVE

## 2017-12-04 ENCOUNTER — Telehealth: Payer: Self-pay

## 2017-12-04 NOTE — Telephone Encounter (Signed)
Patient notified on vmail 

## 2017-12-04 NOTE — Telephone Encounter (Signed)
-----   Message from Abbie Sons, MD sent at 12/03/2017  9:57 AM EDT ----- Urine culture was negative for infection

## 2018-05-02 ENCOUNTER — Encounter: Admission: RE | Payer: Self-pay | Source: Ambulatory Visit

## 2018-05-02 ENCOUNTER — Ambulatory Visit
Admission: RE | Admit: 2018-05-02 | Payer: Medicare Other | Source: Ambulatory Visit | Admitting: Unknown Physician Specialty

## 2018-05-02 SURGERY — COLONOSCOPY WITH PROPOFOL
Anesthesia: General

## 2018-05-03 ENCOUNTER — Ambulatory Visit: Payer: Medicare Other

## 2018-05-03 DIAGNOSIS — N390 Urinary tract infection, site not specified: Secondary | ICD-10-CM

## 2018-05-03 LAB — URINALYSIS, COMPLETE
Bilirubin, UA: NEGATIVE
Glucose, UA: NEGATIVE
Ketones, UA: NEGATIVE
Nitrite, UA: POSITIVE — AB
Specific Gravity, UA: 1.025 (ref 1.005–1.030)
Urobilinogen, Ur: 0.2 mg/dL (ref 0.2–1.0)
pH, UA: 6 (ref 5.0–7.5)

## 2018-05-03 LAB — MICROSCOPIC EXAMINATION: WBC, UA: >30 /hpf — ABNORMAL HIGH (ref 0–5)

## 2018-05-03 MED ORDER — SULFAMETHOXAZOLE-TRIMETHOPRIM 800-160 MG PO TABS: 1.0000 | ORAL_TABLET | Freq: Two times a day (BID) | ORAL | 0 refills | Status: AC

## 2018-05-03 NOTE — Progress Notes (Signed)
Pt is present in office today for a nurse visit. She is complaining of urinary frequency, dysuria, leakage, and urgency. She denies abx usage in last 30 days, she denies urological surgery in the last 30 days. Pt provided clean catch urine sample for analysis and culture. Analysis is significant for infection, pt started on Bactrim bid 7 days.

## 2018-05-06 LAB — CULTURE, URINE COMPREHENSIVE

## 2018-05-07 ENCOUNTER — Telehealth: Payer: Self-pay

## 2018-05-07 NOTE — Telephone Encounter (Signed)
Patient notified

## 2018-05-07 NOTE — Telephone Encounter (Signed)
-----   Message from Abbie Sons, MD sent at 05/06/2018 11:28 AM EDT ----- Urine culture was positive and sensitive to Bactrim

## 2018-05-16 ENCOUNTER — Ambulatory Visit: Payer: Medicare Other

## 2018-05-16 DIAGNOSIS — R300 Dysuria: Secondary | ICD-10-CM

## 2018-05-16 LAB — MICROSCOPIC EXAMINATION

## 2018-05-16 LAB — URINALYSIS, COMPLETE
Bilirubin, UA: NEGATIVE
Glucose, UA: NEGATIVE
Ketones, UA: NEGATIVE
Nitrite, UA: NEGATIVE
Protein, UA: NEGATIVE
RBC, UA: NEGATIVE
Specific Gravity, UA: 1.02 (ref 1.005–1.030)
Urobilinogen, Ur: 0.2 mg/dL (ref 0.2–1.0)
pH, UA: 5.5 (ref 5.0–7.5)

## 2018-05-16 NOTE — Progress Notes (Signed)
The pt comes in complaining of dysuria, urinary frequency, and back and flank pain. She finished her treatment of Bactrim that she was started on on the 05/06/18, but still having some burning symptoms after treatment.

## 2018-05-18 ENCOUNTER — Telehealth: Payer: Self-pay | Admitting: Family Medicine

## 2018-05-18 LAB — URINE CULTURE

## 2018-05-18 NOTE — Telephone Encounter (Signed)
Patient notified

## 2018-05-18 NOTE — Telephone Encounter (Signed)
-----   Message from Abbie Sons, MD sent at 05/18/2018  7:53 AM EDT ----- Urine culture negative for infection

## 2018-09-17 ENCOUNTER — Other Ambulatory Visit: Payer: Self-pay | Admitting: Urology

## 2018-09-20 ENCOUNTER — Telehealth: Payer: Self-pay | Admitting: Urology

## 2018-09-20 NOTE — Telephone Encounter (Signed)
Aaron Edelman from the [facility] called asking if you had a chance to review the labs that they faxed over on 09-17-18 for this patient? He would like someone to review them and call him back @ [phone]   [facility]

## 2018-09-20 NOTE — Telephone Encounter (Signed)
Left message for Jennifer Bernard at [facility] regarding reviewed urine culture per Dr Bernardo Heater

## 2018-09-20 NOTE — Telephone Encounter (Signed)
Urine culture showed no evidence of infection.

## 2018-12-07 ENCOUNTER — Ambulatory Visit: Payer: Medicare Other | Admitting: Internal Medicine

## 2018-12-07 ENCOUNTER — Encounter: Payer: Self-pay | Admitting: Internal Medicine

## 2018-12-07 ENCOUNTER — Other Ambulatory Visit: Payer: Self-pay

## 2018-12-07 DIAGNOSIS — F325 Major depressive disorder, single episode, in full remission: Secondary | ICD-10-CM

## 2018-12-07 DIAGNOSIS — E039 Hypothyroidism, unspecified: Secondary | ICD-10-CM

## 2018-12-07 DIAGNOSIS — I10 Essential (primary) hypertension: Secondary | ICD-10-CM

## 2018-12-07 DIAGNOSIS — M810 Age-related osteoporosis without current pathological fracture: Secondary | ICD-10-CM

## 2018-12-07 DIAGNOSIS — E785 Hyperlipidemia, unspecified: Secondary | ICD-10-CM

## 2018-12-07 DIAGNOSIS — R739 Hyperglycemia, unspecified: Secondary | ICD-10-CM

## 2018-12-07 MED ORDER — ESCITALOPRAM OXALATE 10 MG PO TABS: 10.0000 mg | ORAL_TABLET | Freq: Every day | ORAL | 1 refills | Status: DC

## 2018-12-07 NOTE — Progress Notes (Addendum)
Patient ID: Jennifer Bernard, female   DOB: 17-Mar-1927, 85 y.o.   MRN: [id number] Virtual Visit via video Note  This visit type was conducted due to national recommendations for restrictions regarding the COVID-19 pandemic (e.g. social distancing).  This format is felt to be most appropriate for this patient at this time.  All issues noted in this document were discussed and addressed.  No physical exam was performed (except for noted visual exam findings with Video Visits).   I connected with [name] on 12/07/18 at  3:00 PM EDT by a video enabled telemedicine application and verified that I am speaking with the correct person using two identifiers. Location patient: home Location provider: work Persons participating in the virtual visit: patient, provider and pts son.   I discussed the limitations, risks, security and privacy concerns of performing an evaluation and management service by video and the availability of in person appointments. . The patient expressed understanding and agreed to proceed.   Reason for visit:  Establish care.   HPI: She was previously seeing Dr Candiss Norse.  Has a history of hypercholesterolemia, osteoporosis and hypertension.  She reports she is doing relatively well.  Has a history of increased anxiety.  Was on lexapro.  Has been off.  Feels may need to be back on the medication.  Trying to stay in.  No known COVID exposure.  No fever.  No cough, congestion or sob.  Has exercise machines in her house.  Exercises in the am.  No chest pain.  No acid reflux.  No abdominal pain.  Bowels moving.  Off atenolol.  Also not using cpap.  Has a documented history of sleep apnea.  Not using cpap now.  States she was told she no longer needed.  Mother had a history of breast cancer.  Son reports was diagnosed in her 44s.  She is s/p hysterectomy secondary to fibroids.     ROS: See pertinent positives and negatives per HPI.  Past Medical History:  Diagnosis Date  .  Asymptomatic menopausal state 02/20/1984   Overview:  Naturally occuring and asymptomatic Vaginal atrophy present on topical estrogen Overview:  Overview:  Naturally occuring and asymptomatic Vaginal atrophy present on topical estrogen  . Basal cell carcinoma of skin 02/19/2013   Overview:  Basal cell on her nasolabial fold 2014 S/p Mohs by Dr. Lacinda Axon Followed by local dermatologist regularly Overview:  Overview:  Followed by Dr. Lacinda Axon  . Cyst of ovary 02/19/2009   Overview:  Ovarian cyst on right stable 2004-2010 Cyst ? larger on CT 2012 Stable on follow up US Followed by Dr. Edwinna Areola Overview:  Overview:  Ovarian cyst on right stable 2004-2010 Cyst ? larger on CT 2012 Stable on follow up US Followed by Dr. Edwinna Areola  . Depression, major, single episode, complete remission ([place]) 07/18/2017  . Diffuse cystic mastopathy of breast 02/19/2013   Overview:  Mother with breast cancer later in life Two breast biopsies both with normal pathology Annual mammograms recommended, last 2016 Followed at the [facility]:  Relevant Hx: Course: Daily Update: Today's Plan: Overview:  Overview:  Mother with breast cancer later in life Two breast biopsies both with normal pathology Annual mammograms recommended, last 60  . Essential (primary) hypertension 02/19/2013   Overview:  Goal blood pressure < 135/85 On atenolol Overview:  Overview:  Goal blood pressure < 135/85 On atenolol  . Hyperlipidemia 02/19/2013   Overview:  Goal LDL < 130, last value  92 in 2017 On Pravachol Overview:  Overview:  Goal LDL < 130, last value 82 in 2012 On Pravachol  . Hypothyroidism 02/28/2012   Overview:  started on T4  TFT normal 2017 Followed by Dr. Almon Hercules Overview:  Overview:  started on T4  TFT normal 2014 Followed by Dr. Almon Hercules  . Impaired glucose tolerance 02/20/2003   Overview:  Overview:  Glucose goal < 100, last value 94 in 2014 HgbA1c goal < 6.0, last value 5.3 in 2014 Urine microalbumin negative in  2014 On diet and exercise  . Nontoxic multinodular goiter 03/06/2003   Overview:  Asymptomatic nodular thyroid Stable [place] 2004-2014 Followed by Dr. Almon Hercules Overview:  Overview:  Asymptomatic nodular thyroid Stable [place] 2004-2014 Followed by Dr. Almon Hercules  . OSA on CPAP 01/13/2015   Overview:  Sleep study sent to medical records:   Sleep efficiency 22%, latency 24 min, # awakenings 11, res desat N=10, index 17.8  Apnea 1 for 16 sec, hypopnea 16 for 18 sec, total sleep time only 90 min so index = 11 She did not sleep much and the index puts her in mild category Repeat study a few days later when she slept 187 min her AHI = 4.6 which is normal, average O2 sat 94% I don't think   . Osteoarthritis 02/20/2003   Overview:  Mild bilateral hands Right CMC Right hip Left knee s/p meniscus tear All symptomatically stable Overview:  Overview:  Mild bilateral hands Right CMC Right hip Left knee s/p meniscus tear All symptomatically stable  . Osteoporosis 02/20/2003   Overview:  Bone density 2004 LS -3.32 and RH-1.62 Bone density 2008 LS -3.14 and RH -1.22 Bone density 2009 LS -3.17 and RH -1.21 Bone density 2010 LS -3.14 and RH -1.42  Bone density 2014 LS -2.7 (L3 -3.1) and RH -1.5 Bone density 2017 LS -2.3 and RH -1.4 (neck -1.7) Vitamin D level 38 in 2012 On Boniva 2004 to 2010, restarted 2015 Repeat bone density in 2020 and stop Boniva  Last Assessment & Pl  . Prediabetes 02/20/2003   Overview:  Glucose goal < 100, last value 91 in 2017 HgbA1c goal < 6.0, last value 5.6 in 2017 Urine microalbumin negative in 2017 On diet and exercise  . Recurrent UTI 10/19/2014    Past Surgical History:  Procedure Laterality Date  . ABDOMINAL HYSTERECTOMY  1975  . URETEROSCOPY    . URETEROSCOPY WITH HOLMIUM LASER LITHOTRIPSY      Family History  Problem Relation Age of Onset  . Breast cancer Mother   . Skin cancer Father     SOCIAL HX: reviewed.    Current Outpatient Medications:  .  amLODipine (NORVASC) 2.5 MG tablet, ,  Disp: , Rfl:  .  escitalopram (LEXAPRO) 10 MG tablet, Take 1 tablet (10 mg total) by mouth daily., Disp: 30 tablet, Rfl: 1 .  ibandronate (BONIVA) 150 MG tablet, , Disp: , Rfl:  .  levothyroxine (SYNTHROID, LEVOTHROID) 75 MCG tablet, , Disp: , Rfl:  .  Multiple Vitamin (MULTIVITAMIN) capsule, Take by mouth., Disp: , Rfl:  .  pravastatin (PRAVACHOL) 40 MG tablet, , Disp: , Rfl:  .  salmeterol (SEREVENT DISKUS) 50 MCG/DOSE diskus inhaler, INHALE ONE INHALATION INTO THE LUNGS NIGHTLY, Disp: , Rfl:   EXAM:  GENERAL: alert, oriented, appears well and in no acute distress  HEENT: atraumatic, conjunttiva clear, no obvious abnormalities on inspection of external nose and ears  NECK: normal movements of the head and neck  LUNGS: on inspection no  signs of respiratory distress, breathing rate appears normal, no obvious gross SOB, gasping or wheezing  CV: no obvious cyanosis  PSYCH/NEURO: pleasant and cooperative, no obvious depression or anxiety, speech and thought processing grossly intact  ASSESSMENT AND PLAN:  Discussed the following assessment and plan:  Hyperglycemia - Plan: Hemoglobin A1c  Depression, major, single episode, complete remission (HCC)  Essential (primary) hypertension - Plan: CBC with Differential/Platelet, Basic metabolic panel  Hyperlipidemia, unspecified hyperlipidemia type - Plan: Hepatic function panel, Lipid panel  Hypothyroidism, unspecified type - Plan: TSH  Osteoporosis without current pathological fracture, unspecified osteoporosis type - Plan: VITAMIN D 25 Hydroxy (Vit-D Deficiency, Fractures)  Depression, major, single episode, complete remission ([facility]) Has a documented history of depression.  Was on lexapro. Discussed with her today.  Some anxiety.  Wants to restart lexapro.  Start lexapro 85m q day.  Follow.    Essential (primary) hypertension Documented history of hypertension.  Not taking atenolol.  Have her spot check her pressure.  Send in readings.   If persistent elevation, will need medication.    Hyperlipidemia On pravastatin.  Low cholesterol diet and exercise.  Follow lipid panel and liver function tests.    Hypothyroidism On thyroid replacement.  Follow tsh.   Osteoporosis On boniva.  Gets her bone density through [facility] executive exam.  Obtain records.      I discussed the assessment and treatment plan with the patient. The patient was provided an opportunity to ask questions and all were answered. The patient agreed with the plan and demonstrated an understanding of the instructions.   The patient was advised to call back or seek an in-person evaluation if the symptoms worsen or if the condition fails to improve as anticipated.    Einar Pheasant, MD

## 2018-12-09 ENCOUNTER — Encounter: Payer: Self-pay | Admitting: Internal Medicine

## 2018-12-09 NOTE — Assessment & Plan Note (Signed)
On thyroid replacement.  Follow tsh.  

## 2018-12-09 NOTE — Assessment & Plan Note (Signed)
Documented history of hypertension.  Not taking atenolol.  Have her spot check her pressure.  Send in readings.  If persistent elevation, will need medication.

## 2018-12-09 NOTE — Assessment & Plan Note (Signed)
On pravastatin.  Low cholesterol diet and exercise.  Follow lipid panel and liver function tests.   

## 2018-12-09 NOTE — Assessment & Plan Note (Signed)
On boniva.  Gets her bone density through [facility] executive exam.  Obtain records.

## 2018-12-09 NOTE — Assessment & Plan Note (Signed)
Has a documented history of depression.  Was on lexapro. Discussed with her today.  Some anxiety.  Wants to restart lexapro.  Start lexapro 76m q day.  Follow.

## 2018-12-11 ENCOUNTER — Other Ambulatory Visit: Payer: Self-pay | Admitting: Internal Medicine

## 2018-12-12 ENCOUNTER — Telehealth: Payer: Self-pay | Admitting: Urology

## 2018-12-12 DIAGNOSIS — Z87442 Personal history of urinary calculi: Secondary | ICD-10-CM

## 2018-12-12 NOTE — Telephone Encounter (Signed)
Patient called the office today requesting an appointment.  She states that she has not been seen since 2018.  She states that she has had increased urinary frequency during the night.    What do you suggest?  A virtual visit or schedule appt for next month?

## 2018-12-13 NOTE — Telephone Encounter (Signed)
She needs a follow-up visit with a KUB prior.  Depending on her preference can schedule sometime in June or can do a virtual visit if she desires earlier.  KUB order was entered

## 2019-02-11 ENCOUNTER — Ambulatory Visit: Payer: Medicare Other | Admitting: Urology

## 2019-02-12 ENCOUNTER — Other Ambulatory Visit: Payer: Self-pay | Admitting: Internal Medicine

## 2019-02-13 NOTE — Telephone Encounter (Signed)
I went ahead and refilled the medication, but need to clarify with pt that she is still taking and see if she knows when she started the medication.

## 2019-02-13 NOTE — Telephone Encounter (Signed)
Please confirm pt taking and not problems.  New pt to me.  Ok to refill x 4 months if so.

## 2019-02-18 NOTE — Telephone Encounter (Signed)
Called patient. She is unsure how long she has been on this medication. She says that she has taken it for years. Pt is going to the health clinic at [facility] within the next couple weeks. She is going to ask them and see if they have a start date.

## 2019-02-28 ENCOUNTER — Telehealth: Payer: Self-pay

## 2019-02-28 NOTE — Telephone Encounter (Signed)
Pt can get at the pharmacy

## 2019-02-28 NOTE — Telephone Encounter (Signed)
Copied from [place] [phone]. Topic: Appointment Scheduling - Scheduling Inquiry for Clinic >> Feb 28, 2019 11:28 AM Lennox Solders wrote: Reason for CRM: pt is calling and would like a shingle vaccine

## 2019-03-14 DIAGNOSIS — C4492 Squamous cell carcinoma of skin, unspecified: Secondary | ICD-10-CM

## 2019-03-14 HISTORY — DX: Squamous cell carcinoma of skin, unspecified: C44.92

## 2019-03-21 ENCOUNTER — Ambulatory Visit
Admission: RE | Admit: 2019-03-21 | Discharge: 2019-03-21 | Disposition: A | Discharge status: Home / Self Care | Payer: Medicare Other | Source: Ambulatory Visit | Attending: Urology | Admitting: Urology

## 2019-03-21 ENCOUNTER — Ambulatory Visit (INDEPENDENT_AMBULATORY_CARE_PROVIDER_SITE_OTHER): Payer: Medicare Other | Admitting: Urology

## 2019-03-21 ENCOUNTER — Other Ambulatory Visit: Payer: Self-pay

## 2019-03-21 ENCOUNTER — Encounter: Payer: Self-pay | Admitting: Urology

## 2019-03-21 VITALS — BP 139/83 | HR 92 | Ht 61.5 in | Wt 145.1 lb

## 2019-03-21 DIAGNOSIS — N390 Urinary tract infection, site not specified: Secondary | ICD-10-CM | POA: Diagnosis not present

## 2019-03-21 DIAGNOSIS — Z87442 Personal history of urinary calculi: Secondary | ICD-10-CM | POA: Diagnosis present

## 2019-03-21 DIAGNOSIS — R351 Nocturia: Secondary | ICD-10-CM | POA: Insufficient documentation

## 2019-03-21 MED ORDER — TROSPIUM CHLORIDE 20 MG PO TABS: 20.0000 mg | ORAL_TABLET | Freq: Every day | ORAL | 0 refills | Status: DC

## 2019-03-21 NOTE — Progress Notes (Signed)
03/21/2019 2:27 PM   [name] [birth year] [id number]  Referring provider: Glendon Axe, MD [facility] [place],  [place] [address]  Chief Complaint  Patient presents with  . Nocturia    HPI: 85 y.o. female with a history of nephrolithiasis and recurrent UTIs.  I last saw her in December 2018.  She had a few follow-up nurse visits for symptomatic UTIs however her last infection was in December 2019.  She presents today to discuss nocturia.  At her visit in 2018 she was complaining of nocturia x1-2 but states this has become more consistent and is bothersome.  She has limited nighttime fluids.  She has no bothersome daytime symptoms.  Denies dysuria or gross hematuria.  Denies flank, abdominal, pelvic pain.   PMH: Past Medical History:  Diagnosis Date  . Asymptomatic menopausal state 02/20/1984   Overview:  Naturally occuring and asymptomatic Vaginal atrophy present on topical estrogen Overview:  Overview:  Naturally occuring and asymptomatic Vaginal atrophy present on topical estrogen  . Basal cell carcinoma of skin 02/19/2013   Overview:  Basal cell on her nasolabial fold 2014 S/p Mohs by Dr. Lacinda Axon Followed by local dermatologist regularly Overview:  Overview:  Followed by Dr. Lacinda Axon  . Cyst of ovary 02/19/2009   Overview:  Ovarian cyst on right stable 2004-2010 Cyst ? larger on CT 2012 Stable on follow up US Followed by Dr. Edwinna Areola Overview:  Overview:  Ovarian cyst on right stable 2004-2010 Cyst ? larger on CT 2012 Stable on follow up US Followed by Dr. Edwinna Areola  . Depression, major, single episode, complete remission ([place]) 07/18/2017  . Diffuse cystic mastopathy of breast 02/19/2013   Overview:  Mother with breast cancer later in life Two breast biopsies both with normal pathology Annual mammograms recommended, last 2016 Followed at the [facility]:  Relevant Hx: Course: Daily Update: Today's Plan:  Overview:  Overview:  Mother with breast cancer later in life Two breast biopsies both with normal pathology Annual mammograms recommended, last 71  . Essential (primary) hypertension 02/19/2013   Overview:  Goal blood pressure < 135/85 On atenolol Overview:  Overview:  Goal blood pressure < 135/85 On atenolol  . Hyperlipidemia 02/19/2013   Overview:  Goal LDL < 130, last value 92 in 2017 On Pravachol Overview:  Overview:  Goal LDL < 130, last value 82 in 2012 On Pravachol  . Hypothyroidism 02/28/2012   Overview:  started on T4  TFT normal 2017 Followed by Dr. Almon Hercules Overview:  Overview:  started on T4  TFT normal 2014 Followed by Dr. Almon Hercules  . Impaired glucose tolerance 02/20/2003   Overview:  Overview:  Glucose goal < 100, last value 94 in 2014 HgbA1c goal < 6.0, last value 5.3 in 2014 Urine microalbumin negative in 2014 On diet and exercise  . Nontoxic multinodular goiter 03/06/2003   Overview:  Asymptomatic nodular thyroid Stable [place] 2004-2014 Followed by Dr. Almon Hercules Overview:  Overview:  Asymptomatic nodular thyroid Stable [place] 2004-2014 Followed by Dr. Almon Hercules  . OSA on CPAP 01/13/2015   Overview:  Sleep study sent to medical records:   Sleep efficiency 22%, latency 24 min, # awakenings 11, res desat N=10, index 17.8  Apnea 1 for 16 sec, hypopnea 16 for 18 sec, total sleep time only 90 min so index = 11 She did not sleep much and the index puts her in mild category Repeat study a few days later when she slept  187 min her AHI = 4.6 which is normal, average O2 sat 94% I don't think   . Osteoarthritis 02/20/2003   Overview:  Mild bilateral hands Right CMC Right hip Left knee s/p meniscus tear All symptomatically stable Overview:  Overview:  Mild bilateral hands Right CMC Right hip Left knee s/p meniscus tear All symptomatically stable  . Osteoporosis 02/20/2003   Overview:  Bone density 2004 LS -3.32 and RH-1.62 Bone density 2008 LS -3.14 and RH -1.22 Bone density 2009 LS -3.17 and RH -1.21 Bone density  2010 LS -3.14 and RH -1.42  Bone density 2014 LS -2.7 (L3 -3.1) and RH -1.5 Bone density 2017 LS -2.3 and RH -1.4 (neck -1.7) Vitamin D level 38 in 2012 On Boniva 2004 to 2010, restarted 2015 Repeat bone density in 2020 and stop Boniva  Last Assessment & Pl  . Prediabetes 02/20/2003   Overview:  Glucose goal < 100, last value 91 in 2017 HgbA1c goal < 6.0, last value 5.6 in 2017 Urine microalbumin negative in 2017 On diet and exercise  . Recurrent UTI 10/19/2014    Surgical History: Past Surgical History:  Procedure Laterality Date  . ABDOMINAL HYSTERECTOMY  1975  . URETEROSCOPY    . URETEROSCOPY WITH HOLMIUM LASER LITHOTRIPSY      Home Medications:  Allergies as of 03/21/2019      Reactions   Penicillins Hives   Other reaction(s): Unknown   Ceftin [cefuroxime Axetil] Diarrhea   GI distress   Ramipril    Other reaction(s): Cough Other reaction(s): Cough      Medication List       Accurate as of March 21, 2019  2:27 PM. If you have any questions, ask your nurse or doctor.        amLODipine 2.5 MG tablet Commonly known as: NORVASC   escitalopram 10 MG tablet Commonly known as: LEXAPRO Take 1 tablet (10 mg total) by mouth daily.   ibandronate 150 MG tablet Commonly known as: BONIVA TAKE 1 TABLET BY MOUTH MONTHLY WITH A FULL GLASS OF WATER. DO NOT LIEDOWN FORNEXT 60 MINUTES   levothyroxine 75 MCG tablet Commonly known as: SYNTHROID   mometasone 0.1 % cream Commonly known as: ELOCON   multivitamin capsule Take by mouth.   pravastatin 40 MG tablet Commonly known as: PRAVACHOL TAKE ONE TABLET BY MOUTH EVERY EVENING   Serevent Diskus 50 MCG/DOSE diskus inhaler Generic drug: salmeterol INHALE ONE INHALATION INTO THE LUNGS NIGHTLY   traZODone 50 MG tablet Commonly known as: DESYREL       Allergies:  Allergies  Allergen Reactions  . Penicillins Hives    Other reaction(s): Unknown  . Ceftin [Cefuroxime Axetil] Diarrhea    GI distress  . Ramipril     Other  reaction(s): Cough Other reaction(s): Cough     Family History: Family History  Problem Relation Age of Onset  . Breast cancer Mother   . Skin cancer Father     Social History:  reports that she has never smoked. She has never used smokeless tobacco. She reports that she does not drink alcohol or use drugs.  ROS: UROLOGY Frequent Urination?: No Hard to postpone urination?: No Burning/pain with urination?: No Get up at night to urinate?: Yes Leakage of urine?: No Urine stream starts and stops?: No Trouble starting stream?: No Do you have to strain to urinate?: No Blood in urine?: No Urinary tract infection?: No Sexually transmitted disease?: No Injury to kidneys or bladder?: No Painful intercourse?: No Weak stream?:  No Currently pregnant?: No Vaginal bleeding?: No Last menstrual period?: N/A  Gastrointestinal Nausea?: No Vomiting?: No Indigestion/heartburn?: No Diarrhea?: Yes Constipation?: No  Constitutional Fever: No Night sweats?: No Weight loss?: No Fatigue?: Yes  Skin Skin rash/lesions?: No Itching?: No  Eyes Blurred vision?: No Double vision?: No  Ears/Nose/Throat Sore throat?: No Sinus problems?: No  Hematologic/Lymphatic Swollen glands?: No Easy bruising?: No  Cardiovascular Leg swelling?: No Chest pain?: No  Respiratory Cough?: No Shortness of breath?: No  Endocrine Excessive thirst?: No  Musculoskeletal Back pain?: No Joint pain?: No  Neurological Headaches?: No Dizziness?: No  Psychologic Depression?: Yes Anxiety?: Yes  Physical Exam: BP 139/83 (BP Location: Left Arm, Patient Position: Sitting, Cuff Size: Normal)   Pulse 92   Ht 5' 1.5" (1.562 m)   Wt 145 lb 1.6 oz (65.8 kg)   BMI 26.97 kg/m   Constitutional:  Alert and oriented, No acute distress. HEENT: [place] AT, moist mucus membranes.  Trachea midline, no masses. Cardiovascular: No clubbing, cyanosis, or edema. Respiratory: Normal respiratory effort, no  increased work of breathing. GI: Abdomen is soft, nontender, nondistended, no abdominal masses GU: No CVA tenderness Lymph: No cervical or inguinal lymphadenopathy. Skin: No rashes, bruises or suspicious lesions. Neurologic: Grossly intact, no focal deficits, moving all 4 extremities. Psychiatric: Normal mood and affect.   Pertinent Imaging: KUB performed today was personally reviewed and there are no calcifications identified suspicious for urinary tract stones.  Pelvic phleboliths are present.  Assessment & Plan:    - Nocturia I had a detailed discussion with Ms. Dorrance regarding nocturia that although common it can be very difficult to treat.  We discussed the likelihood of nocturnal polyuria secondary to decrease ADH and that treatments are very expensive and not recommended with age >32.  She was interested in a trial of a nighttime short acting anticholinergic.  Will give a trial of trospium 20 mg 1 hour prior to bedtime.  I would be hesitant to use oxybutynin in this age group.  - Recurrent UTI No symptoms for approximately 1 year  Annual follow-up   Abbie Sons, MD  [facility] [address] [place], [place] [address] [phone]

## 2019-04-01 ENCOUNTER — Other Ambulatory Visit: Payer: Self-pay

## 2019-04-01 MED ORDER — TROSPIUM CHLORIDE 20 MG PO TABS: 20.0000 mg | ORAL_TABLET | Freq: Every day | ORAL | 11 refills | Status: DC

## 2019-04-30 ENCOUNTER — Telehealth: Payer: Self-pay | Admitting: *Deleted

## 2019-04-30 NOTE — Telephone Encounter (Signed)
Copied from [place] [id number]. Topic: General - Other >> Apr 30, 2019  1:39 PM Keene Breath wrote: Reason for CRM: Patient wanted to talk with the nurse about some appts. That she has set up.  She has some questions.  CB# [phone]

## 2019-05-01 ENCOUNTER — Telehealth: Payer: Self-pay

## 2019-05-01 NOTE — Telephone Encounter (Signed)
Copied from [place] [id number]. Topic: General - Other >> May 01, 2019  2:12 PM Rainey Pines A wrote: Patient stated that she had labs done at [facility] executive program in July and wants to know does she still need to get labs done 05/17/2019. Patient wants to know if records can be retrieved from there and would like a callback.

## 2019-05-01 NOTE — Telephone Encounter (Signed)
Called pt. Phone was busy 

## 2019-05-02 NOTE — Telephone Encounter (Signed)
Called patient and she gave phone number to call [facility] executive program @ [phone] and I called and got the fax number to fax a record request for the patient's lab results.  Nina,cnma

## 2019-05-08 NOTE — Telephone Encounter (Signed)
Patient checking on the status if lab results were received from [place]  and if so patient would like to cancel 05/17/2019 at 8:30am, please advise [phone]

## 2019-05-14 NOTE — Telephone Encounter (Signed)
Left message for patient to call back  

## 2019-05-14 NOTE — Telephone Encounter (Signed)
Pt called back in, office on other calls .

## 2019-05-15 NOTE — Telephone Encounter (Signed)
Called patient. Moved lab appt. It was too early for her to have her A1C checked.

## 2019-05-17 ENCOUNTER — Other Ambulatory Visit: Payer: Medicare Other

## 2019-05-20 ENCOUNTER — Other Ambulatory Visit: Payer: Self-pay

## 2019-05-20 ENCOUNTER — Ambulatory Visit (INDEPENDENT_AMBULATORY_CARE_PROVIDER_SITE_OTHER): Payer: Medicare HMO | Admitting: Internal Medicine

## 2019-05-20 VITALS — BP 126/80 | HR 86 | Temp 97.5°F | Resp 16 | Wt 145.0 lb

## 2019-05-20 DIAGNOSIS — F325 Major depressive disorder, single episode, in full remission: Secondary | ICD-10-CM

## 2019-05-20 DIAGNOSIS — Z23 Encounter for immunization: Secondary | ICD-10-CM

## 2019-05-20 DIAGNOSIS — E785 Hyperlipidemia, unspecified: Secondary | ICD-10-CM

## 2019-05-20 DIAGNOSIS — G4733 Obstructive sleep apnea (adult) (pediatric): Secondary | ICD-10-CM

## 2019-05-20 DIAGNOSIS — R5383 Other fatigue: Secondary | ICD-10-CM

## 2019-05-20 DIAGNOSIS — I10 Essential (primary) hypertension: Secondary | ICD-10-CM

## 2019-05-20 DIAGNOSIS — E039 Hypothyroidism, unspecified: Secondary | ICD-10-CM | POA: Diagnosis not present

## 2019-05-20 DIAGNOSIS — M810 Age-related osteoporosis without current pathological fracture: Secondary | ICD-10-CM | POA: Diagnosis not present

## 2019-05-20 DIAGNOSIS — R0602 Shortness of breath: Secondary | ICD-10-CM

## 2019-05-20 DIAGNOSIS — K130 Diseases of lips: Secondary | ICD-10-CM

## 2019-05-20 LAB — BASIC METABOLIC PANEL
BUN: 13 mg/dL (ref 6–23)
CO2: 27 mEq/L (ref 19–32)
Calcium: 9.6 mg/dL (ref 8.4–10.5)
Chloride: 106 mEq/L (ref 96–112)
Creatinine, Ser: 0.73 mg/dL (ref 0.40–1.20)
GFR: 74.87 mL/min (ref >60.00)
Glucose, Bld: 84 mg/dL (ref 70–99)
Potassium: 4 mEq/L (ref 3.5–5.1)
Sodium: 142 mEq/L (ref 135–145)

## 2019-05-20 LAB — CBC WITH DIFFERENTIAL/PLATELET
Basophils Absolute: 0.1 10*3/uL (ref 0.0–0.1)
Basophils Relative: 0.8 % (ref 0.0–3.0)
Eosinophils Absolute: 0.1 10*3/uL (ref 0.0–0.7)
Eosinophils Relative: 0.8 % (ref 0.0–5.0)
HCT: 45.8 % (ref 36.0–46.0)
Hemoglobin: 15.2 g/dL — ABNORMAL HIGH (ref 12.0–15.0)
Lymphocytes Relative: 20.4 % (ref 12.0–46.0)
Lymphs Abs: 1.6 10*3/uL (ref 0.7–4.0)
MCHC: 33.2 g/dL (ref 30.0–36.0)
MCV: 91.5 fl (ref 78.0–100.0)
Monocytes Absolute: 0.7 10*3/uL (ref 0.1–1.0)
Monocytes Relative: 8.9 % (ref 3.0–12.0)
Neutro Abs: 5.5 10*3/uL (ref 1.4–7.7)
Neutrophils Relative %: 69.1 % (ref 43.0–77.0)
Platelets: 246 10*3/uL (ref 150.0–400.0)
RBC: 5.01 Mil/uL (ref 3.87–5.11)
RDW: 13.7 % (ref 11.5–15.5)
WBC: 7.9 10*3/uL (ref 4.0–10.5)

## 2019-05-20 LAB — HEPATIC FUNCTION PANEL
ALT: 12 U/L (ref 0–35)
AST: 19 U/L (ref 0–37)
Albumin: 4.1 g/dL (ref 3.5–5.2)
Alkaline Phosphatase: 59 U/L (ref 39–117)
Bilirubin, Direct: 0.1 mg/dL (ref 0.0–0.3)
Total Bilirubin: 0.7 mg/dL (ref 0.2–1.2)
Total Protein: 6.5 g/dL (ref 6.0–8.3)

## 2019-05-20 LAB — LIPID PANEL
Cholesterol: 173 mg/dL (ref 0–200)
HDL: 64.7 mg/dL (ref >39.00)
LDL Cholesterol: 72 mg/dL (ref 0–99)
NonHDL: 108.25
Total CHOL/HDL Ratio: 3
Triglycerides: 181 mg/dL — ABNORMAL HIGH (ref 0.0–149.0)
VLDL: 36.2 mg/dL (ref 0.0–40.0)

## 2019-05-20 LAB — IBC + FERRITIN
Ferritin: 72.9 ng/mL (ref 10.0–291.0)
Iron: 115 ug/dL (ref 42–145)
Saturation Ratios: 38.4 % (ref 20.0–50.0)
Transferrin: 214 mg/dL (ref 212.0–360.0)

## 2019-05-20 LAB — VITAMIN D 25 HYDROXY (VIT D DEFICIENCY, FRACTURES): VITD: 33.69 ng/mL (ref 30.00–100.00)

## 2019-05-20 LAB — SEDIMENTATION RATE: Sed Rate: 13 mm/hr (ref 0–30)

## 2019-05-20 LAB — TSH: TSH: 0.9 u[IU]/mL (ref 0.35–4.50)

## 2019-05-20 NOTE — Progress Notes (Signed)
.Patient ID: Jennifer Bernard, female   DOB: 09-Oct-1926, 85 y.o.   MRN: [id number]   Subjective:    Patient ID: Jennifer Bernard, female    DOB: [birth year], 85 y.o.   MRN: [id number]  HPI  Patient here for a scheduled physical exam.  She has and is being followed through [facility].  Has sleep apnea, but not using cpap.  States she feels she is sleeping well.  Increased stress.  On lexapro and trazodone.  Feels she is handling things relatively well.  Discussed labs.  On pravastatin.  Seeing urology.  Just evaluated for nocturia.  Started on trospium.  She tries to stay active.  Does report noticing some increased sob with exertion.  No chest pain, but has noticed a difference with the sob - with inclines, etc.  No acid reflux.  No abdominal pain.  Reports some cracking - corner of her lips. Discussed checking iron studies given changes.      Past Medical History:  Diagnosis Date  . Asymptomatic menopausal state 02/20/1984   Overview:  Naturally occuring and asymptomatic Vaginal atrophy present on topical estrogen Overview:  Overview:  Naturally occuring and asymptomatic Vaginal atrophy present on topical estrogen  . Basal cell carcinoma of skin 02/19/2013   Overview:  Basal cell on her nasolabial fold 2014 S/p Mohs by Dr. Lacinda Axon Followed by local dermatologist regularly Overview:  Overview:  Followed by Dr. Lacinda Axon  . Cyst of ovary 02/19/2009   Overview:  Ovarian cyst on right stable 2004-2010 Cyst ? larger on CT 2012 Stable on follow up US Followed by Dr. Edwinna Areola Overview:  Overview:  Ovarian cyst on right stable 2004-2010 Cyst ? larger on CT 2012 Stable on follow up US Followed by Dr. Edwinna Areola  . Depression, major, single episode, complete remission ([place]) 07/18/2017  . Diffuse cystic mastopathy of breast 02/19/2013   Overview:  Mother with breast cancer later in life Two breast biopsies both with normal pathology Annual mammograms recommended, last 2016 Followed at the [facility]:  Relevant Hx: Course: Daily Update: Today's Plan: Overview:  Overview:  Mother with breast cancer later in life Two breast biopsies both with normal pathology Annual mammograms recommended, last 8  . Essential (primary) hypertension 02/19/2013   Overview:  Goal blood pressure < 135/85 On atenolol Overview:  Overview:  Goal blood pressure < 135/85 On atenolol  . Hyperlipidemia 02/19/2013   Overview:  Goal LDL < 130, last value 92 in 2017 On Pravachol Overview:  Overview:  Goal LDL < 130, last value 82 in 2012 On Pravachol  . Hypothyroidism 02/28/2012   Overview:  started on T4  TFT normal 2017 Followed by Dr. Almon Hercules Overview:  Overview:  started on T4  TFT normal 2014 Followed by Dr. Almon Hercules  . Impaired glucose tolerance 02/20/2003   Overview:  Overview:  Glucose goal < 100, last value 94 in 2014 HgbA1c goal < 6.0, last value 5.3 in 2014 Urine microalbumin negative in 2014 On diet and exercise  . Nontoxic multinodular goiter 03/06/2003   Overview:  Asymptomatic nodular thyroid Stable [place] 2004-2014 Followed by Dr. Almon Hercules Overview:  Overview:  Asymptomatic nodular thyroid Stable [place] 2004-2014 Followed by Dr. Almon Hercules  . OSA on CPAP 01/13/2015   Overview:  Sleep study sent to medical records:   Sleep efficiency 22%, latency 24 min, # awakenings 11, res desat N=10, index 17.8  Apnea 1 for 16 sec, hypopnea 16 for 18 sec, total sleep  time only 90 min so index = 11 She did not sleep much and the index puts her in mild category Repeat study a few days later when she slept 187 min her AHI = 4.6 which is normal, average O2 sat 94% I don't think   . Osteoarthritis 02/20/2003   Overview:  Mild bilateral hands Right CMC Right hip Left knee s/p meniscus tear All symptomatically stable Overview:  Overview:  Mild bilateral hands Right CMC Right hip Left knee s/p meniscus tear All symptomatically stable  . Osteoporosis 02/20/2003   Overview:  Bone density 2004 LS -3.32 and RH-1.62 Bone density 2008 LS -3.14  and RH -1.22 Bone density 2009 LS -3.17 and RH -1.21 Bone density 2010 LS -3.14 and RH -1.42  Bone density 2014 LS -2.7 (L3 -3.1) and RH -1.5 Bone density 2017 LS -2.3 and RH -1.4 (neck -1.7) Vitamin D level 38 in 2012 On Boniva 2004 to 2010, restarted 2015 Repeat bone density in 2020 and stop Boniva  Last Assessment & Pl  . Prediabetes 02/20/2003   Overview:  Glucose goal < 100, last value 91 in 2017 HgbA1c goal < 6.0, last value 5.6 in 2017 Urine microalbumin negative in 2017 On diet and exercise  . Recurrent UTI 10/19/2014   Past Surgical History:  Procedure Laterality Date  . ABDOMINAL HYSTERECTOMY  1975  . URETEROSCOPY    . URETEROSCOPY WITH HOLMIUM LASER LITHOTRIPSY     Family History  Problem Relation Age of Onset  . Breast cancer Mother   . Skin cancer Father    Social History   Socioeconomic History  . Marital status: Widowed    Spouse name: Not on file  . Number of children: Not on file  . Years of education: Not on file  . Highest education level: Not on file  Occupational History  . Not on file  Social Needs  . Financial resource strain: Not on file  . Food insecurity    Worry: Not on file    Inability: Not on file  . Transportation needs    Medical: Not on file    Non-medical: Not on file  Tobacco Use  . Smoking status: Never Smoker  . Smokeless tobacco: Never Used  Substance and Sexual Activity  . Alcohol use: No    Frequency: Never  . Drug use: No  . Sexual activity: Not Currently    Birth control/protection: Post-menopausal  Lifestyle  . Physical activity    Days per week: Not on file    Minutes per session: Not on file  . Stress: Not on file  Relationships  . Social Herbalist on phone: Not on file    Gets together: Not on file    Attends religious service: Not on file    Active member of club or organization: Not on file    Attends meetings of clubs or organizations: Not on file    Relationship status: Not on file  Other Topics Concern   . Not on file  Social History Narrative  . Not on file    Outpatient Encounter Medications as of 05/20/2019  Medication Sig  . escitalopram (LEXAPRO) 10 MG tablet Take 1 tablet (10 mg total) by mouth daily.  Marland Kitchen levothyroxine (SYNTHROID, LEVOTHROID) 75 MCG tablet   . mometasone (ELOCON) 0.1 % cream   . Multiple Vitamin (MULTIVITAMIN) capsule Take by mouth.  . pravastatin (PRAVACHOL) 40 MG tablet TAKE ONE TABLET BY MOUTH EVERY EVENING  . trospium (SANCTURA) 20  MG tablet Take 1 tablet (20 mg total) by mouth at bedtime.  . [DISCONTINUED] amLODipine (NORVASC) 2.5 MG tablet   . [DISCONTINUED] ibandronate (BONIVA) 150 MG tablet TAKE 1 TABLET BY MOUTH MONTHLY WITH A FULL GLASS OF WATER. DO NOT LIEDOWN FORNEXT 60 MINUTES  . [DISCONTINUED] salmeterol (SEREVENT DISKUS) 50 MCG/DOSE diskus inhaler INHALE ONE INHALATION INTO THE LUNGS NIGHTLY  . [DISCONTINUED] traZODone (DESYREL) 50 MG tablet    No facility-administered encounter medications on file as of 05/20/2019.    Review of Systems  Constitutional: Negative for appetite change and unexpected weight change.  HENT: Negative for congestion and sinus pressure.   Eyes: Negative for pain and visual disturbance.  Respiratory: Positive for shortness of breath. Negative for cough and chest tightness.   Cardiovascular: Negative for chest pain, palpitations and leg swelling.  Gastrointestinal: Negative for abdominal pain, diarrhea, nausea and vomiting.  Genitourinary: Negative for difficulty urinating and dysuria.  Musculoskeletal: Negative for joint swelling and myalgias.  Skin: Negative for color change and rash.  Neurological: Negative for dizziness, light-headedness and headaches.  Hematological: Negative for adenopathy. Does not bruise/bleed easily.  Psychiatric/Behavioral: Negative for agitation and dysphoric mood.       Objective:    Physical Exam Constitutional:      General: She is not in acute distress.    Appearance: Normal appearance.  She is well-developed.  HENT:     Head: Normocephalic and atraumatic.     Right Ear: External ear normal.     Left Ear: External ear normal.  Eyes:     General: No scleral icterus.       Right eye: No discharge.        Left eye: No discharge.     Conjunctiva/sclera: Conjunctivae normal.  Neck:     Musculoskeletal: Neck supple. No muscular tenderness.     Thyroid: No thyromegaly.  Cardiovascular:     Rate and Rhythm: Normal rate and regular rhythm.  Pulmonary:     Effort: No tachypnea, accessory muscle usage or respiratory distress.     Breath sounds: Normal breath sounds. No decreased breath sounds or wheezing.  Chest:     Breasts:        Right: No inverted nipple, mass, nipple discharge or tenderness (no axillary adenopathy).        Left: No inverted nipple, mass, nipple discharge or tenderness (no axilarry adenopathy).  Abdominal:     General: Bowel sounds are normal.     Palpations: Abdomen is soft.     Tenderness: There is no abdominal tenderness.  Musculoskeletal:        General: No swelling or tenderness.  Lymphadenopathy:     Cervical: No cervical adenopathy.  Skin:    Findings: No erythema or rash.  Neurological:     Mental Status: She is alert and oriented to person, place, and time.  Psychiatric:        Mood and Affect: Mood normal.        Behavior: Behavior normal.     BP 126/80   Pulse 86   Temp (!) 97.5 F (36.4 C)   Resp 16   Wt 145 lb (65.8 kg)   SpO2 97%   BMI 26.95 kg/m  Wt Readings from Last 3 Encounters:  05/20/19 145 lb (65.8 kg)  03/21/19 145 lb 1.6 oz (65.8 kg)  11/29/17 140 lb (63.5 kg)     Lab Results  Component Value Date   WBC 7.9 05/20/2019   HGB 15.2 (H) 05/20/2019  HCT 45.8 05/20/2019   PLT 246.0 05/20/2019   GLUCOSE 84 05/20/2019   CHOL 173 05/20/2019   TRIG 181.0 (H) 05/20/2019   HDL 64.70 05/20/2019   LDLCALC 72 05/20/2019   ALT 12 05/20/2019   AST 19 05/20/2019   NA 142 05/20/2019   K 4.0 05/20/2019   CL 106  05/20/2019   CREATININE 0.73 05/20/2019   BUN 13 05/20/2019   CO2 27 05/20/2019   TSH 0.90 05/20/2019    Abdomen 1 View (kub)  Result Date: 03/21/2019 CLINICAL DATA:  History of kidney stones EXAM: ABDOMEN - 1 VIEW COMPARISON:  None FINDINGS: Small scattered calcifications in the pelvis felt represent phleboliths. No evidence of bowel obstruction, organomegaly, or free air. Degenerative changes in the hips and lumbar spine. IMPRESSION: No acute findings. Electronically Signed   By: Rolm Baptise M.D.   On: 03/21/2019 19:31       Assessment & Plan:   Problem List Items Addressed This Visit    Cracked lip    Checked iron studies.        Depression, major, single episode, complete remission ([place])    On lexapro and trazodone.  Doing well.  Follow.        Essential (primary) hypertension - Primary    Blood pressure under good control.  Continue same medication regimen.  Follow pressures.  Follow metabolic panel.        Relevant Orders   CBC with Differential/Platelet (Completed)   Basic metabolic panel (Completed)   Sedimentation rate (Completed)   Hyperlipidemia    Low cholesterol diet and exercise.  Follow lipid panel and liver function tests.  On pravastatin.       Relevant Orders   Lipid panel (Completed)   Hepatic function panel (Completed)   Hypothyroidism    On thyroid replacement.  Follow tsh.        Relevant Orders   TSH (Completed)   OSA (obstructive sleep apnea)    Not using cpap.  States sleeping better.        Osteoporosis    Has been on boniva.  Gets her bone density tests through [facility].  They recently stopped boniva.  Follow.       Relevant Orders   VITAMIN D 25 Hydroxy (Vit-D Deficiency, Fractures) (Completed)   SOB (shortness of breath) on exertion    Reports sob with exertion.  A change for her.  Notices with exertion and inclines.  EKG - SR with no acute ischemic changes.  Given change in symptoms, will refer to cardiology for further evaluation.         Relevant Orders   EKG 12-Lead (Completed)   Ambulatory referral to Cardiology    Other Visit Diagnoses    Other fatigue       Relevant Orders   IBC + Ferritin (Completed)   Need for immunization against influenza       Relevant Orders   Flu Vaccine QUAD High Dose(Fluad) (Completed)       Einar Pheasant, MD

## 2019-05-23 ENCOUNTER — Other Ambulatory Visit: Payer: Self-pay

## 2019-05-23 ENCOUNTER — Other Ambulatory Visit: Payer: Self-pay | Admitting: Internal Medicine

## 2019-05-23 ENCOUNTER — Telehealth: Payer: Self-pay

## 2019-05-23 NOTE — Telephone Encounter (Signed)
Copied from [place] [id number]. Topic: General - Inquiry >> May 23, 2019 11:22 AM Jennifer Bernard, NT wrote: Reason for CRM: Patient called in stating she would like a nurse to give her a call in regards to her recent lab work as well as clarification on medication. Please advise.

## 2019-05-23 NOTE — Telephone Encounter (Signed)
She was wanting me to clarify her med list with her. There were a couple things that I needed to remove from list

## 2019-05-23 NOTE — Telephone Encounter (Signed)
Need more information.  See result note.  I am not sure what she needs clarified.

## 2019-05-24 ENCOUNTER — Telehealth: Payer: Self-pay

## 2019-05-24 NOTE — Telephone Encounter (Signed)
Copied from [place] [id number]. Topic: General - Call Back - No Documentation >> May 24, 2019 11:24 AM Erick Blinks wrote: Pt called requesting call back to discuss lab results.  Best contact: [phone]

## 2019-05-25 ENCOUNTER — Encounter: Payer: Self-pay | Admitting: Internal Medicine

## 2019-05-25 DIAGNOSIS — K130 Diseases of lips: Secondary | ICD-10-CM | POA: Insufficient documentation

## 2019-05-25 NOTE — Assessment & Plan Note (Signed)
Low cholesterol diet and exercise.  Follow lipid panel and liver function tests.  On pravastatin.   

## 2019-05-25 NOTE — Assessment & Plan Note (Signed)
Checked iron studies.

## 2019-05-25 NOTE — Assessment & Plan Note (Signed)
Reports sob with exertion.  A change for her.  Notices with exertion and inclines.  EKG - SR with no acute ischemic changes.  Given change in symptoms, will refer to cardiology for further evaluation.

## 2019-05-25 NOTE — Assessment & Plan Note (Signed)
On lexapro and trazodone.  Doing well.  Follow.

## 2019-05-25 NOTE — Assessment & Plan Note (Signed)
Not using cpap.  States sleeping better.

## 2019-05-25 NOTE — Assessment & Plan Note (Signed)
On thyroid replacement.  Follow tsh.  

## 2019-05-25 NOTE — Assessment & Plan Note (Signed)
Blood pressure under good control.  Continue same medication regimen.  Follow pressures.  Follow metabolic panel.   

## 2019-05-25 NOTE — Assessment & Plan Note (Signed)
Has been on boniva.  Gets her bone density tests through [facility].  They recently stopped boniva.  Follow.  

## 2019-05-27 ENCOUNTER — Other Ambulatory Visit: Payer: Self-pay | Admitting: Internal Medicine

## 2019-05-27 DIAGNOSIS — R0602 Shortness of breath: Secondary | ICD-10-CM

## 2019-05-27 NOTE — Progress Notes (Signed)
Order placed for referral to cardiology

## 2019-05-27 NOTE — Telephone Encounter (Signed)
Patient was notified of results on 10/8.

## 2019-05-30 ENCOUNTER — Telehealth: Payer: Self-pay

## 2019-05-30 DIAGNOSIS — M810 Age-related osteoporosis without current pathological fracture: Secondary | ICD-10-CM | POA: Diagnosis not present

## 2019-05-30 DIAGNOSIS — E042 Nontoxic multinodular goiter: Secondary | ICD-10-CM | POA: Diagnosis not present

## 2019-05-30 NOTE — Telephone Encounter (Signed)
Copied from [place] [id number]. Topic: Quick Communication - Lab Results (Clinic Use ONLY) >> May 30, 2019  9:23 AM Pauline Good wrote: Pt want results of her recent labs

## 2019-06-03 ENCOUNTER — Other Ambulatory Visit: Payer: Medicare Other

## 2019-06-03 DIAGNOSIS — Z803 Family history of malignant neoplasm of breast: Secondary | ICD-10-CM | POA: Diagnosis not present

## 2019-06-03 DIAGNOSIS — Z1231 Encounter for screening mammogram for malignant neoplasm of breast: Secondary | ICD-10-CM | POA: Diagnosis not present

## 2019-06-03 LAB — HM MAMMOGRAPHY

## 2019-06-04 NOTE — Telephone Encounter (Signed)
Patient notfied of results again.

## 2019-06-07 NOTE — Progress Notes (Signed)
Trospium chloride 20mg  Approvedon August 11 Request Reference Number: [id number]. TROSPIUM CL TAB 20MG  is approved through 08/15/2019. For further questions, call [phone].

## 2019-06-08 ENCOUNTER — Encounter: Payer: Self-pay | Admitting: Internal Medicine

## 2019-06-08 DIAGNOSIS — Z1239 Encounter for other screening for malignant neoplasm of breast: Secondary | ICD-10-CM | POA: Insufficient documentation

## 2019-06-12 ENCOUNTER — Telehealth: Payer: Self-pay

## 2019-06-12 DIAGNOSIS — R0602 Shortness of breath: Secondary | ICD-10-CM | POA: Diagnosis not present

## 2019-06-12 DIAGNOSIS — E7800 Pure hypercholesterolemia, unspecified: Secondary | ICD-10-CM | POA: Diagnosis not present

## 2019-06-12 NOTE — Telephone Encounter (Signed)
Copied from [place] [phone]. Topic: General - Inquiry >> Jun 12, 2019  3:55 PM Virl Axe D wrote: Reason for CRM: Dr. Tyler Pita office at [facility] needs pt's tracing of her EKG faxed over. Pt is currently at the office. [phone]

## 2019-06-12 NOTE — Telephone Encounter (Signed)
EKG faxed. [facility] notified.

## 2019-06-25 DIAGNOSIS — R152 Fecal urgency: Secondary | ICD-10-CM | POA: Diagnosis not present

## 2019-06-25 DIAGNOSIS — R159 Full incontinence of feces: Secondary | ICD-10-CM | POA: Diagnosis not present

## 2019-06-25 DIAGNOSIS — K30 Functional dyspepsia: Secondary | ICD-10-CM | POA: Diagnosis not present

## 2019-06-27 DIAGNOSIS — E7800 Pure hypercholesterolemia, unspecified: Secondary | ICD-10-CM | POA: Diagnosis not present

## 2019-06-27 DIAGNOSIS — R0602 Shortness of breath: Secondary | ICD-10-CM | POA: Diagnosis not present

## 2019-06-27 DIAGNOSIS — I10 Essential (primary) hypertension: Secondary | ICD-10-CM | POA: Diagnosis not present

## 2019-07-04 DIAGNOSIS — H6123 Impacted cerumen, bilateral: Secondary | ICD-10-CM | POA: Diagnosis not present

## 2019-07-04 DIAGNOSIS — H903 Sensorineural hearing loss, bilateral: Secondary | ICD-10-CM | POA: Diagnosis not present

## 2019-07-16 ENCOUNTER — Other Ambulatory Visit: Payer: Self-pay | Admitting: Internal Medicine

## 2019-08-02 ENCOUNTER — Telehealth: Payer: Self-pay | Admitting: Urology

## 2019-08-02 NOTE — Telephone Encounter (Signed)
Spoke to patient and she states she had stopped taking Trospium awhile back and in the last few days she has been getting up several times and wanted to know if she could start taking the medication again. I informed that will be ok. I told her if she is still getting up frequently she can call and schedule an office visit. Patient voiced understanding.

## 2019-08-02 NOTE — Telephone Encounter (Signed)
Pt is getting up more frequently at night and thinks the RX, Trospium she is taking may not be working.  She doesn't think she has an infection.

## 2019-08-06 DIAGNOSIS — L570 Actinic keratosis: Secondary | ICD-10-CM | POA: Diagnosis not present

## 2019-08-06 DIAGNOSIS — L820 Inflamed seborrheic keratosis: Secondary | ICD-10-CM | POA: Diagnosis not present

## 2019-08-06 DIAGNOSIS — L821 Other seborrheic keratosis: Secondary | ICD-10-CM | POA: Diagnosis not present

## 2019-08-14 ENCOUNTER — Telehealth: Payer: Self-pay | Admitting: Internal Medicine

## 2019-08-14 NOTE — Telephone Encounter (Signed)
Please advise 

## 2019-08-14 NOTE — Telephone Encounter (Signed)
Pt called about having a bad reaction to her arm was very sore to the shingles shot before. Pt wants to know if she's ok to get the covid vaccine? Please advise and Thank you!  Call pt @ [phone].

## 2019-08-20 NOTE — Telephone Encounter (Signed)
Please let her know that we were not in the office last week and sorry for the delay in response.  Need to clarify her "bad reaction" with previous vaccine.  Regarding covid vaccine, the only contraindication that I am aware of is if she is allergic to any of the ingredients in covid vaccine.  Also, caution if has had anaphylactic reaction to other vaccines.  If just arm soreness and no other reaction, then no contraindication.  Let me know if questions.

## 2019-08-20 NOTE — Telephone Encounter (Signed)
Patient did not have a bad reaction. Her arm was really sore. Explained message to her below. She will go get COVID vaccine tomorrow.

## 2019-09-04 ENCOUNTER — Encounter: Payer: Self-pay | Admitting: Internal Medicine

## 2019-09-04 ENCOUNTER — Ambulatory Visit (INDEPENDENT_AMBULATORY_CARE_PROVIDER_SITE_OTHER): Payer: Medicare HMO | Admitting: Internal Medicine

## 2019-09-04 ENCOUNTER — Other Ambulatory Visit: Payer: Self-pay

## 2019-09-04 VITALS — Ht 62.0 in | Wt 145.0 lb

## 2019-09-04 DIAGNOSIS — E785 Hyperlipidemia, unspecified: Secondary | ICD-10-CM | POA: Diagnosis not present

## 2019-09-04 DIAGNOSIS — E039 Hypothyroidism, unspecified: Secondary | ICD-10-CM | POA: Diagnosis not present

## 2019-09-04 DIAGNOSIS — R351 Nocturia: Secondary | ICD-10-CM

## 2019-09-04 DIAGNOSIS — R0602 Shortness of breath: Secondary | ICD-10-CM

## 2019-09-04 DIAGNOSIS — K219 Gastro-esophageal reflux disease without esophagitis: Secondary | ICD-10-CM | POA: Diagnosis not present

## 2019-09-04 DIAGNOSIS — F325 Major depressive disorder, single episode, in full remission: Secondary | ICD-10-CM | POA: Diagnosis not present

## 2019-09-04 MED ORDER — ESCITALOPRAM OXALATE 10 MG PO TABS: 10.0000 mg | ORAL_TABLET | Freq: Every day | ORAL | 1 refills | Status: DC

## 2019-09-04 NOTE — Progress Notes (Signed)
Patient ID: Jennifer Bernard, female   DOB: 05-27-1927, 86 y.o.   MRN: [id number]   Virtual Visit via telephone Note  This visit type was conducted due to national recommendations for restrictions regarding the COVID-19 pandemic (e.g. social distancing).  This format is felt to be most appropriate for this patient at this time.  All issues noted in this document were discussed and addressed.  No physical exam was performed (except for noted visual exam findings with Video Visits).   I connected with Jennifer Bernard by telephone and verified that I am speaking with the correct person using two identifiers. Location patient: home Location provider: work  Persons participating in the telephone visit: patient, provider  The limitations, risks, security and privacy concerns of performing an evaluation and management service by telephone and the availability of in person appointments have been discussed.  The patient expressed understanding and agreed to proceed.   Reason for visit: scheduled follow up.    HPI: She reports she is doing relatively well.  Evaluated previously for sob.  ECHO - normal EF.  Negative stress test.  Last saw cardiology 06/27/19 - recommended no further intervention.  No chest pain.  Breathing stable.  Has been seen at [facility] for fecal incont.  Recommended PT for pelvic floor dysfunction and benefiber.  Went to PT.  Did not see significant improvement in her symptoms.  Overall bowels appear to be stable.  Has noticed some acid reflux.  Occasionally food sticks.  Sometimes when eats - cough.  No chest congestion.  Off trazodone.  Not sleeping well, but on questioning - relates to increased nocturia.  Off trospium.  Plans to restart. Not taking lexapro.  Discussed restarting.  Feels needs to be on something to level things out.     ROS: See pertinent positives and negatives per HPI.  Past Medical History:  Diagnosis Date  . Asymptomatic menopausal state 02/20/1984   Overview:   Naturally occuring and asymptomatic Vaginal atrophy present on topical estrogen Overview:  Overview:  Naturally occuring and asymptomatic Vaginal atrophy present on topical estrogen  . Basal cell carcinoma of skin 02/19/2013   Overview:  Basal cell on her nasolabial fold 2014 S/p Mohs by Dr. Lacinda Axon Followed by local dermatologist regularly Overview:  Overview:  Followed by Dr. Lacinda Axon  . Cyst of ovary 02/19/2009   Overview:  Ovarian cyst on right stable 2004-2010 Cyst ? larger on CT 2012 Stable on follow up US Followed by Dr. Edwinna Areola Overview:  Overview:  Ovarian cyst on right stable 2004-2010 Cyst ? larger on CT 2012 Stable on follow up US Followed by Dr. Edwinna Areola  . Depression, major, single episode, complete remission ([place]) 07/18/2017  . Diffuse cystic mastopathy of breast 02/19/2013   Overview:  Mother with breast cancer later in life Two breast biopsies both with normal pathology Annual mammograms recommended, last 2016 Followed at the [place]:  Relevant Hx: Course: Daily Update: Today's Plan: Overview:  Overview:  Mother with breast cancer later in life Two breast biopsies both with normal pathology Annual mammograms recommended, last 30  . Essential (primary) hypertension 02/19/2013   Overview:  Goal blood pressure < 135/85 On atenolol Overview:  Overview:  Goal blood pressure < 135/85 On atenolol  . Hyperlipidemia 02/19/2013   Overview:  Goal LDL < 130, last value 92 in 2017 On Pravachol Overview:  Overview:  Goal LDL < 130, last value 82 in 2012 On Pravachol  . Hypothyroidism  02/28/2012   Overview:  started on T4  TFT normal 2017 Followed by Dr. Almon Hercules Overview:  Overview:  started on T4  TFT normal 2014 Followed by Dr. Almon Hercules  . Impaired glucose tolerance 02/20/2003   Overview:  Overview:  Glucose goal < 100, last value 94 in 2014 HgbA1c goal < 6.0, last value 5.3 in 2014 Urine microalbumin negative in 2014 On diet and exercise  . Nontoxic multinodular  goiter 03/06/2003   Overview:  Asymptomatic nodular thyroid Stable [place] 2004-2014 Followed by Dr. Almon Hercules Overview:  Overview:  Asymptomatic nodular thyroid Stable [place] 2004-2014 Followed by Dr. Almon Hercules  . OSA on CPAP 01/13/2015   Overview:  Sleep study sent to medical records:   Sleep efficiency 22%, latency 24 min, # awakenings 11, res desat N=10, index 17.8  Apnea 1 for 16 sec, hypopnea 16 for 18 sec, total sleep time only 90 min so index = 11 She did not sleep much and the index puts her in mild category Repeat study a few days later when she slept 187 min her AHI = 4.6 which is normal, average O2 sat 94% I don't think   . Osteoarthritis 02/20/2003   Overview:  Mild bilateral hands Right CMC Right hip Left knee s/p meniscus tear All symptomatically stable Overview:  Overview:  Mild bilateral hands Right CMC Right hip Left knee s/p meniscus tear All symptomatically stable  . Osteoporosis 02/20/2003   Overview:  Bone density 2004 LS -3.32 and RH-1.62 Bone density 2008 LS -3.14 and RH -1.22 Bone density 2009 LS -3.17 and RH -1.21 Bone density 2010 LS -3.14 and RH -1.42  Bone density 2014 LS -2.7 (L3 -3.1) and RH -1.5 Bone density 2017 LS -2.3 and RH -1.4 (neck -1.7) Vitamin D level 38 in 2012 On Boniva 2004 to 2010, restarted 2015 Repeat bone density in 2020 and stop Boniva  Last Assessment & Pl  . Prediabetes 02/20/2003   Overview:  Glucose goal < 100, last value 91 in 2017 HgbA1c goal < 6.0, last value 5.6 in 2017 Urine microalbumin negative in 2017 On diet and exercise  . Recurrent UTI 10/19/2014    Past Surgical History:  Procedure Laterality Date  . ABDOMINAL HYSTERECTOMY  1975  . URETEROSCOPY    . URETEROSCOPY WITH HOLMIUM LASER LITHOTRIPSY      Family History  Problem Relation Age of Onset  . Breast cancer Mother   . Skin cancer Father     SOCIAL HX: reviewed.    Current Outpatient Medications:  .  escitalopram (LEXAPRO) 10 MG tablet, Take 1 tablet (10 mg total) by mouth daily., Disp: 30  tablet, Rfl: 1 .  levothyroxine (SYNTHROID, LEVOTHROID) 75 MCG tablet, , Disp: , Rfl:  .  mometasone (ELOCON) 0.1 % cream, , Disp: , Rfl:  .  Multiple Vitamin (MULTIVITAMIN) capsule, Take by mouth., Disp: , Rfl:  .  pravastatin (PRAVACHOL) 40 MG tablet, TAKE ONE TABLET BY MOUTH EVERY EVENING, Disp: 90 tablet, Rfl: 1 .  trospium (SANCTURA) 20 MG tablet, Take 1 tablet (20 mg total) by mouth at bedtime., Disp: 30 tablet, Rfl: 11  EXAM:  GENERAL: alert. Sounds to be in no acute distress.  Answering questions appropriately.    PSYCH/NEURO: pleasant and cooperative, no obvious depression or anxiety, speech and thought processing grossly intact  ASSESSMENT AND PLAN:  Discussed the following assessment and plan:  Depression, major, single episode, complete remission ([place]) Has previously been on trazodone and lexapro.  Not taking either one of these medications  now.  Restart lexapro 10mg  q day.  Hold on starting trazodone.  Restart trospium.  See if this helps her sleep with decreased nocturia.  Follow closely.    Hyperlipidemia On pravastatin.  Low cholesterol diet and exercise.  Follow lipid panel and liver function tests.    Hypothyroidism On thyroid replacement.  Follow tsh.   SOB (shortness of breath) on exertion Had cardiac w/up.  ECHO and stress test as outlined.  Treat acid reflux.  Follow closely.    Acid reflux Acid reflux as outlined.  Some dysphagia.  Start pepcid 20mg  q day.  Schedule f/u soon to reassess.  If persistent symptoms, will plan for further evaluation.    Nocturia Sees urology.  Discussed restarting trospium.     Orders Placed This Encounter  Procedures  . DG Chest 2 View    Standing Status:   Future    Standing Expiration Date:   11/01/2020    Order Specific Question:   Reason for Exam (SYMPTOM  OR DIAGNOSIS REQUIRED)    Answer:   sob and cough - persistent intermittent    Order Specific Question:   Preferred imaging location?    Answer:   [facility]     Order Specific Question:   Radiology Contrast Protocol - do NOT remove file path    Answer:   [email or web address]  . Hepatic function panel    Standing Status:   Future    Standing Expiration Date:   09/03/2020  . Lipid panel    Standing Status:   Future    Standing Expiration Date:   09/03/2020  . Basic metabolic panel    Standing Status:   Future    Standing Expiration Date:   09/03/2020    Meds ordered this encounter  Medications  . escitalopram (LEXAPRO) 10 MG tablet    Sig: Take 1 tablet (10 mg total) by mouth daily.    Dispense:  30 tablet    Refill:  1    PT REQUEST REFILLS PLEAES     I discussed the assessment and treatment plan with the patient. The patient was provided an opportunity to ask questions and all were answered. The patient agreed with the plan and demonstrated an understanding of the instructions.   The patient was advised to call back or seek an in-person evaluation if the symptoms worsen or if the condition fails to improve as anticipated.  I provided 25 minutes of non-face-to-face time during this encounter.   Einar Pheasant, MD

## 2019-09-08 ENCOUNTER — Encounter: Payer: Self-pay | Admitting: Internal Medicine

## 2019-09-08 DIAGNOSIS — K219 Gastro-esophageal reflux disease without esophagitis: Secondary | ICD-10-CM | POA: Insufficient documentation

## 2019-09-08 NOTE — Assessment & Plan Note (Signed)
Had cardiac w/up.  ECHO and stress test as outlined.  Treat acid reflux.  Follow closely.

## 2019-09-08 NOTE — Assessment & Plan Note (Signed)
Acid reflux as outlined.  Some dysphagia.  Start pepcid 20mg  q day.  Schedule f/u soon to reassess.  If persistent symptoms, will plan for further evaluation.

## 2019-09-08 NOTE — Assessment & Plan Note (Signed)
On pravastatin.  Low cholesterol diet and exercise.  Follow lipid panel and liver function tests.   

## 2019-09-08 NOTE — Assessment & Plan Note (Signed)
On thyroid replacement.  Follow tsh.  

## 2019-09-08 NOTE — Assessment & Plan Note (Signed)
Sees urology.  Discussed restarting trospium.

## 2019-09-08 NOTE — Assessment & Plan Note (Signed)
Has previously been on trazodone and lexapro.  Not taking either one of these medications now.  Restart lexapro 10mg  q day.  Hold on starting trazodone.  Restart trospium.  See if this helps her sleep with decreased nocturia.  Follow closely.

## 2019-09-19 ENCOUNTER — Telehealth: Payer: Self-pay | Admitting: Internal Medicine

## 2019-09-19 DIAGNOSIS — E785 Hyperlipidemia, unspecified: Secondary | ICD-10-CM

## 2019-09-19 NOTE — Telephone Encounter (Signed)
Pt wanted to ask a question about an xray that Dr Nicki Reaper wants her to have but would not give me more info. Please advise.

## 2019-09-23 NOTE — Telephone Encounter (Signed)
LMTCB

## 2019-09-23 NOTE — Telephone Encounter (Signed)
Pt was returning call 

## 2019-09-27 NOTE — Telephone Encounter (Signed)
Patient calling about having cxr done that was ordered. Also complained of some breast tenderness and would like a breast exam. Scheduled for 2/19. She is going to do fasting labs and the cxr at [facility] the week of appt with PCP

## 2019-09-27 NOTE — Telephone Encounter (Signed)
LMTCB

## 2019-09-27 NOTE — Telephone Encounter (Signed)
Patient returned your phone call.

## 2019-10-02 ENCOUNTER — Ambulatory Visit
Admission: RE | Admit: 2019-10-02 | Discharge: 2019-10-02 | Disposition: A | Discharge status: Home / Self Care | Payer: Medicare HMO | Source: Ambulatory Visit | Attending: Internal Medicine | Admitting: Internal Medicine

## 2019-10-02 ENCOUNTER — Other Ambulatory Visit
Admission: RE | Admit: 2019-10-02 | Discharge: 2019-10-02 | Disposition: A | Discharge status: Home / Self Care | Payer: Medicare HMO | Source: Ambulatory Visit | Attending: Internal Medicine | Admitting: Internal Medicine

## 2019-10-02 DIAGNOSIS — R0602 Shortness of breath: Secondary | ICD-10-CM | POA: Diagnosis not present

## 2019-10-02 DIAGNOSIS — R05 Cough: Secondary | ICD-10-CM | POA: Diagnosis not present

## 2019-10-02 DIAGNOSIS — I77810 Thoracic aortic ectasia: Secondary | ICD-10-CM | POA: Insufficient documentation

## 2019-10-02 DIAGNOSIS — E785 Hyperlipidemia, unspecified: Secondary | ICD-10-CM | POA: Diagnosis not present

## 2019-10-02 LAB — BASIC METABOLIC PANEL
Anion gap: 10 (ref 5–15)
BUN: 15 mg/dL (ref 8–23)
CO2: 24 mmol/L (ref 22–32)
Calcium: 9.3 mg/dL (ref 8.9–10.3)
Chloride: 107 mmol/L (ref 98–111)
Creatinine, Ser: 0.76 mg/dL (ref 0.44–1.00)
GFR calc Af Amer: >60 mL/min (ref >60)
GFR calc non Af Amer: >60 mL/min (ref >60)
Glucose, Bld: 104 mg/dL — ABNORMAL HIGH (ref 70–99)
Potassium: 3.8 mmol/L (ref 3.5–5.1)
Sodium: 141 mmol/L (ref 135–145)

## 2019-10-02 LAB — LIPID PANEL
Cholesterol: 194 mg/dL (ref 0–200)
HDL: 74 mg/dL (ref >40)
LDL Cholesterol: 94 mg/dL (ref 0–99)
Total CHOL/HDL Ratio: 2.6 RATIO
Triglycerides: 129 mg/dL (ref <150)
VLDL: 26 mg/dL (ref 0–40)

## 2019-10-02 LAB — HEPATIC FUNCTION PANEL
ALT: 15 U/L (ref 0–44)
AST: 23 U/L (ref 15–41)
Albumin: 4.1 g/dL (ref 3.5–5.0)
Alkaline Phosphatase: 53 U/L (ref 38–126)
Bilirubin, Direct: 0.1 mg/dL (ref 0.0–0.2)
Indirect Bilirubin: 1 mg/dL — ABNORMAL HIGH (ref 0.3–0.9)
Total Bilirubin: 1.1 mg/dL (ref 0.3–1.2)
Total Protein: 7 g/dL (ref 6.5–8.1)

## 2019-10-04 ENCOUNTER — Ambulatory Visit: Payer: Medicare HMO | Admitting: Internal Medicine

## 2019-10-04 ENCOUNTER — Other Ambulatory Visit: Payer: Self-pay | Admitting: Internal Medicine

## 2019-10-04 DIAGNOSIS — R0602 Shortness of breath: Secondary | ICD-10-CM

## 2019-10-04 DIAGNOSIS — R05 Cough: Secondary | ICD-10-CM

## 2019-10-04 DIAGNOSIS — R059 Cough, unspecified: Secondary | ICD-10-CM

## 2019-10-04 NOTE — Progress Notes (Signed)
Order placed for pulmonary referral.  

## 2019-10-10 ENCOUNTER — Other Ambulatory Visit: Payer: Self-pay

## 2019-10-10 ENCOUNTER — Ambulatory Visit (INDEPENDENT_AMBULATORY_CARE_PROVIDER_SITE_OTHER): Payer: Medicare HMO | Admitting: Internal Medicine

## 2019-10-10 DIAGNOSIS — F325 Major depressive disorder, single episode, in full remission: Secondary | ICD-10-CM

## 2019-10-10 DIAGNOSIS — I10 Essential (primary) hypertension: Secondary | ICD-10-CM | POA: Diagnosis not present

## 2019-10-10 DIAGNOSIS — R519 Headache, unspecified: Secondary | ICD-10-CM

## 2019-10-10 DIAGNOSIS — E785 Hyperlipidemia, unspecified: Secondary | ICD-10-CM

## 2019-10-10 DIAGNOSIS — N644 Mastodynia: Secondary | ICD-10-CM

## 2019-10-10 DIAGNOSIS — N6459 Other signs and symptoms in breast: Secondary | ICD-10-CM | POA: Diagnosis not present

## 2019-10-10 DIAGNOSIS — R739 Hyperglycemia, unspecified: Secondary | ICD-10-CM

## 2019-10-10 DIAGNOSIS — E039 Hypothyroidism, unspecified: Secondary | ICD-10-CM | POA: Diagnosis not present

## 2019-10-10 DIAGNOSIS — R29898 Other symptoms and signs involving the musculoskeletal system: Secondary | ICD-10-CM

## 2019-10-10 DIAGNOSIS — R351 Nocturia: Secondary | ICD-10-CM

## 2019-10-10 DIAGNOSIS — M810 Age-related osteoporosis without current pathological fracture: Secondary | ICD-10-CM

## 2019-10-10 NOTE — Progress Notes (Signed)
Patient ID: Jennifer Bernard, female   DOB: 14-Feb-1927, 86 y.o.   MRN: [id number]   Subjective:    Patient ID: Jennifer Bernard, female    DOB: 1927/03/26, 86 y.o.   MRN: [id number]  HPI This visit occurred during the SARS-CoV-2 public health emergency.  Safety protocols were in place, including screening questions prior to the visit, additional usage of staff PPE, and extensive cleaning of exam room while observing appropriate contact time as indicated for disinfecting solutions.  Patient here as a work in appt with concerns regarding breast tenderness.  Reports left breast tenderness > right.  No nipple discharge.  Reports tries to stay active.  No chest pain or sob.  No acid reflux.  No abdominal pain.  Bowels moving.  Not taking lexapro.  Brought in her medication.  Reviewed.  Not taking sanctura.  Gets up multiple times a night to urinate.  Feel this is affecting her sleep.  She also reports legs feel weaker.  Some unsteadiness.  Discussed physical therapy. Also reported headache - frontal.  Not severe, but has noticed.  No vision change.     Past Medical History:  Diagnosis Date  . Asymptomatic menopausal state 02/20/1984   Overview:  Naturally occuring and asymptomatic Vaginal atrophy present on topical estrogen Overview:  Overview:  Naturally occuring and asymptomatic Vaginal atrophy present on topical estrogen  . Basal cell carcinoma of skin 02/19/2013   Overview:  Basal cell on her nasolabial fold 2014 S/p Mohs by Dr. Lacinda Axon Followed by local dermatologist regularly Overview:  Overview:  Followed by Dr. Lacinda Axon  . Cyst of ovary 02/19/2009   Overview:  Ovarian cyst on right stable 2004-2010 Cyst ? larger on CT 2012 Stable on follow up US Followed by Dr. Edwinna Areola Overview:  Overview:  Ovarian cyst on right stable 2004-2010 Cyst ? larger on CT 2012 Stable on follow up US Followed by Dr. Edwinna Areola  . Depression, major, single episode, complete remission ([place]) 07/18/2017  . Diffuse cystic  mastopathy of breast 02/19/2013   Overview:  Mother with breast cancer later in life Two breast biopsies both with normal pathology Annual mammograms recommended, last 2016 Followed at the [facility]:  Relevant Hx: Course: Daily Update: Today's Plan: Overview:  Overview:  Mother with breast cancer later in life Two breast biopsies both with normal pathology Annual mammograms recommended, last 30  . Essential (primary) hypertension 02/19/2013   Overview:  Goal blood pressure < 135/85 On atenolol Overview:  Overview:  Goal blood pressure < 135/85 On atenolol  . Hyperlipidemia 02/19/2013   Overview:  Goal LDL < 130, last value 92 in 2017 On Pravachol Overview:  Overview:  Goal LDL < 130, last value 82 in 2012 On Pravachol  . Hypothyroidism 02/28/2012   Overview:  started on T4  TFT normal 2017 Followed by Dr. Almon Hercules Overview:  Overview:  started on T4  TFT normal 2014 Followed by Dr. Almon Hercules  . Impaired glucose tolerance 02/20/2003   Overview:  Overview:  Glucose goal < 100, last value 94 in 2014 HgbA1c goal < 6.0, last value 5.3 in 2014 Urine microalbumin negative in 2014 On diet and exercise  . Nontoxic multinodular goiter 03/06/2003   Overview:  Asymptomatic nodular thyroid Stable [place] 2004-2014 Followed by Dr. Almon Hercules Overview:  Overview:  Asymptomatic nodular thyroid Stable [place] 2004-2014 Followed by Dr. Almon Hercules  . OSA on CPAP 01/13/2015   Overview:  Sleep study sent to medical records:  Sleep efficiency 22%, latency 24 min, # awakenings 11, res desat N=10, index 17.8  Apnea 1 for 16 sec, hypopnea 16 for 18 sec, total sleep time only 90 min so index = 11 She did not sleep much and the index puts her in mild category Repeat study a few days later when she slept 187 min her AHI = 4.6 which is normal, average O2 sat 94% I don't think   . Osteoarthritis 02/20/2003   Overview:  Mild bilateral hands Right CMC Right hip Left knee s/p meniscus tear All symptomatically stable  Overview:  Overview:  Mild bilateral hands Right CMC Right hip Left knee s/p meniscus tear All symptomatically stable  . Osteoporosis 02/20/2003   Overview:  Bone density 2004 LS -3.32 and RH-1.62 Bone density 2008 LS -3.14 and RH -1.22 Bone density 2009 LS -3.17 and RH -1.21 Bone density 2010 LS -3.14 and RH -1.42  Bone density 2014 LS -2.7 (L3 -3.1) and RH -1.5 Bone density 2017 LS -2.3 and RH -1.4 (neck -1.7) Vitamin D level 38 in 2012 On Boniva 2004 to 2010, restarted 2015 Repeat bone density in 2020 and stop Boniva  Last Assessment & Pl  . Prediabetes 02/20/2003   Overview:  Glucose goal < 100, last value 91 in 2017 HgbA1c goal < 6.0, last value 5.6 in 2017 Urine microalbumin negative in 2017 On diet and exercise  . Recurrent UTI 10/19/2014   Past Surgical History:  Procedure Laterality Date  . ABDOMINAL HYSTERECTOMY  1975  . URETEROSCOPY    . URETEROSCOPY WITH HOLMIUM LASER LITHOTRIPSY     Family History  Problem Relation Age of Onset  . Breast cancer Mother   . Skin cancer Father    Social History   Socioeconomic History  . Marital status: Widowed    Spouse name: Not on file  . Number of children: Not on file  . Years of education: Not on file  . Highest education level: Not on file  Occupational History  . Not on file  Tobacco Use  . Smoking status: Never Smoker  . Smokeless tobacco: Never Used  Substance and Sexual Activity  . Alcohol use: No  . Drug use: No  . Sexual activity: Not Currently    Birth control/protection: Post-menopausal  Other Topics Concern  . Not on file  Social History Narrative  . Not on file   Social Determinants of Health   Financial Resource Strain:   . Difficulty of Paying Living Expenses: Not on file  Food Insecurity:   . Worried About Charity fundraiser in the Last Year: Not on file  . Ran Out of Food in the Last Year: Not on file  Transportation Needs:   . Lack of Transportation (Medical): Not on file  . Lack of Transportation  (Non-Medical): Not on file  Physical Activity:   . Days of Exercise per Week: Not on file  . Minutes of Exercise per Session: Not on file  Stress:   . Feeling of Stress : Not on file  Social Connections:   . Frequency of Communication with Friends and Family: Not on file  . Frequency of Social Gatherings with Friends and Family: Not on file  . Attends Religious Services: Not on file  . Active Member of Clubs or Organizations: Not on file  . Attends Archivist Meetings: Not on file  . Marital Status: Not on file    Outpatient Encounter Medications as of 10/10/2019  Medication Sig  . escitalopram (  LEXAPRO) 10 MG tablet Take 1 tablet (10 mg total) by mouth daily.  Marland Kitchen levothyroxine (SYNTHROID, LEVOTHROID) 75 MCG tablet   . mometasone (ELOCON) 0.1 % cream   . Multiple Vitamin (MULTIVITAMIN) capsule Take by mouth.  . pravastatin (PRAVACHOL) 40 MG tablet TAKE ONE TABLET BY MOUTH EVERY EVENING  . trospium (SANCTURA) 20 MG tablet Take 1 tablet (20 mg total) by mouth at bedtime.   No facility-administered encounter medications on file as of 10/10/2019.   Review of Systems  Constitutional: Negative for appetite change and unexpected weight change.  HENT: Negative for congestion and sinus pressure.   Respiratory: Negative for cough, chest tightness and shortness of breath.   Cardiovascular: Negative for chest pain, palpitations and leg swelling.  Gastrointestinal: Negative for abdominal pain, diarrhea and nausea.  Genitourinary: Negative for difficulty urinating and dysuria.       Nocturia  Musculoskeletal: Negative for joint swelling and myalgias.       Some weakness and unsteadiness as outlined.    Skin: Negative for color change and rash.  Neurological: Negative for dizziness, light-headedness and headaches.  Psychiatric/Behavioral: Negative for agitation and dysphoric mood.       Objective:    Physical Exam Constitutional:      General: She is not in acute distress.     Appearance: Normal appearance.  HENT:     Head: Normocephalic and atraumatic.     Right Ear: External ear normal.     Left Ear: External ear normal.  Eyes:     General: No scleral icterus.       Right eye: No discharge.        Left eye: No discharge.     Conjunctiva/sclera: Conjunctivae normal.  Neck:     Thyroid: No thyromegaly.  Cardiovascular:     Rate and Rhythm: Normal rate and regular rhythm.  Pulmonary:     Effort: No respiratory distress.     Breath sounds: Normal breath sounds. No wheezing.     Comments: Breasts:  Nipple inversion - right.  No nipple discharge present.  Could not appreciate any nodules or axillary adenopathy.   Abdominal:     General: Bowel sounds are normal.     Palpations: Abdomen is soft.     Tenderness: There is no abdominal tenderness.  Musculoskeletal:        General: No swelling or tenderness.     Cervical back: Neck supple. No tenderness.  Lymphadenopathy:     Cervical: No cervical adenopathy.  Skin:    Findings: No erythema or rash.  Neurological:     Mental Status: She is alert.  Psychiatric:        Mood and Affect: Mood normal.        Behavior: Behavior normal.     BP 116/70   Pulse 90   Temp (!) 97.3 F (36.3 C)   Resp 16   Wt 144 lb (65.3 kg)   SpO2 97%   BMI 26.34 kg/m  Wt Readings from Last 3 Encounters:  10/10/19 144 lb (65.3 kg)  09/04/19 145 lb (65.8 kg)  05/20/19 145 lb (65.8 kg)     Lab Results  Component Value Date   WBC 7.3 10/10/2019   HGB 15.2 (H) 10/10/2019   HCT 45.8 10/10/2019   PLT 232.0 10/10/2019   GLUCOSE 94 10/10/2019   CHOL 161 10/10/2019   TRIG 170.0 (H) 10/10/2019   HDL 60.70 10/10/2019   LDLCALC 66 10/10/2019   ALT 13 10/10/2019  AST 20 10/10/2019   NA 140 10/10/2019   K 4.1 10/10/2019   CL 105 10/10/2019   CREATININE 0.88 10/10/2019   BUN 13 10/10/2019   CO2 27 10/10/2019   TSH 1.83 10/10/2019   HGBA1C 5.5 10/10/2019    DG Chest 2 View  Result Date: 10/02/2019 CLINICAL DATA:   Shortness of breath, nonproductive cough, and congestion for 2 months. EXAM: CHEST - 2 VIEW COMPARISON:  None. FINDINGS: The cardiac silhouette is normal in size. The thoracic aorta is ectatic. There is slight anterior eventration of the right hemidiaphragm. No confluent airspace opacity, edema, pleural effusion, pneumothorax is identified. There is likely mild scarring in the lung bases. No acute osseous abnormality is seen. IMPRESSION: 1. No evidence of active cardiopulmonary disease. 2. Ectatic thoracic aorta. Electronically Signed   By: Logan Bores M.D.   On: 10/02/2019 12:18       Assessment & Plan:   Problem List Items Addressed This Visit    Breast tenderness    Left breast tenderness and right nipple inversion as outlined.  Obtain diagnostic mammogram.  Further w/up pending results.        Relevant Orders   MM DIAG BREAST TOMO BILATERAL   US BREAST LTD UNI LEFT INC AXILLA   Depression, major, single episode, complete remission ([place])    Previously treated with lexapro and trazodone.  Off trazodone.  Stopped taking lexapro.  Discussed restarting.  Remain off trazodone.  Follow.        Essential (primary) hypertension   Headache    Headache as outlined.  Check esr. See if can get her sleeping better.  Follow.        Relevant Orders   CBC with Differential/Platelet (Completed)   Sedimentation rate (Completed)   Hyperlipidemia    On pravastatin.  Follow lipid panel and liver function tests.        Hypothyroidism    On thyroid replacement.  Follow tsh.       Inversion of right nipple    Inversion of right nipple.  Obtain diagnostic mammogram and ultrasound.  Further w/up pending results.        Relevant Orders   MM DIAG BREAST TOMO BILATERAL   US BREAST LTD UNI RIGHT INC AXILLA   Leg weakness    Leg weakness and unsteadiness as outlined.  No focal deficits noted on exam.  Refer to physical therapy.        Relevant Orders   Ambulatory referral to Physical Therapy    Nocturia    Followed by urology.  Was on sanctura.  States did not help. Discuss further w/up with Dr Bernardo Heater.        Osteoporosis    Other Visit Diagnoses    Hyperglycemia          I spent more than 40 minutes with the patient and more than 50% of the time was spent in consultation regarding the above. Time spent discussing her current symptoms and concerns.  Time also spent discussing further w/up and evaluation.    Einar Pheasant, MD

## 2019-10-11 LAB — CBC WITH DIFFERENTIAL/PLATELET
Basophils Absolute: 0.1 10*3/uL (ref 0.0–0.1)
Basophils Relative: 1.1 % (ref 0.0–3.0)
Eosinophils Absolute: 0.1 10*3/uL (ref 0.0–0.7)
Eosinophils Relative: 1.5 % (ref 0.0–5.0)
HCT: 45.8 % (ref 36.0–46.0)
Hemoglobin: 15.2 g/dL — ABNORMAL HIGH (ref 12.0–15.0)
Lymphocytes Relative: 27.8 % (ref 12.0–46.0)
Lymphs Abs: 2 10*3/uL (ref 0.7–4.0)
MCHC: 33.3 g/dL (ref 30.0–36.0)
MCV: 91.1 fl (ref 78.0–100.0)
Monocytes Absolute: 0.7 10*3/uL (ref 0.1–1.0)
Monocytes Relative: 9.2 % (ref 3.0–12.0)
Neutro Abs: 4.4 10*3/uL (ref 1.4–7.7)
Neutrophils Relative %: 60.4 % (ref 43.0–77.0)
Platelets: 232 10*3/uL (ref 150.0–400.0)
RBC: 5.02 Mil/uL (ref 3.87–5.11)
RDW: 14.2 % (ref 11.5–15.5)
WBC: 7.3 10*3/uL (ref 4.0–10.5)

## 2019-10-11 LAB — BASIC METABOLIC PANEL
BUN: 13 mg/dL (ref 6–23)
CO2: 27 mEq/L (ref 19–32)
Calcium: 9.7 mg/dL (ref 8.4–10.5)
Chloride: 105 mEq/L (ref 96–112)
Creatinine, Ser: 0.88 mg/dL (ref 0.40–1.20)
GFR: 60.29 mL/min (ref >60.00)
Glucose, Bld: 94 mg/dL (ref 70–99)
Potassium: 4.1 mEq/L (ref 3.5–5.1)
Sodium: 140 mEq/L (ref 135–145)

## 2019-10-11 LAB — HEPATIC FUNCTION PANEL
ALT: 13 U/L (ref 0–35)
AST: 20 U/L (ref 0–37)
Albumin: 4 g/dL (ref 3.5–5.2)
Alkaline Phosphatase: 59 U/L (ref 39–117)
Bilirubin, Direct: 0.1 mg/dL (ref 0.0–0.3)
Total Bilirubin: 0.6 mg/dL (ref 0.2–1.2)
Total Protein: 6.6 g/dL (ref 6.0–8.3)

## 2019-10-11 LAB — LIPID PANEL
Cholesterol: 161 mg/dL (ref 0–200)
HDL: 60.7 mg/dL (ref >39.00)
LDL Cholesterol: 66 mg/dL (ref 0–99)
NonHDL: 100.33
Total CHOL/HDL Ratio: 3
Triglycerides: 170 mg/dL — ABNORMAL HIGH (ref 0.0–149.0)
VLDL: 34 mg/dL (ref 0.0–40.0)

## 2019-10-11 LAB — VITAMIN D 25 HYDROXY (VIT D DEFICIENCY, FRACTURES): VITD: 36.51 ng/mL (ref 30.00–100.00)

## 2019-10-11 LAB — SEDIMENTATION RATE: Sed Rate: 5 mm/hr (ref 0–30)

## 2019-10-11 LAB — HEMOGLOBIN A1C: Hgb A1c MFr Bld: 5.5 % (ref 4.6–6.5)

## 2019-10-11 LAB — TSH: TSH: 1.83 u[IU]/mL (ref 0.35–4.50)

## 2019-10-13 ENCOUNTER — Encounter: Payer: Self-pay | Admitting: Internal Medicine

## 2019-10-13 DIAGNOSIS — R29898 Other symptoms and signs involving the musculoskeletal system: Secondary | ICD-10-CM | POA: Insufficient documentation

## 2019-10-13 NOTE — Assessment & Plan Note (Signed)
On thyroid replacement.  Follow tsh.  

## 2019-10-13 NOTE — Assessment & Plan Note (Signed)
Left breast tenderness and right nipple inversion as outlined.  Obtain diagnostic mammogram.  Further w/up pending results.

## 2019-10-13 NOTE — Assessment & Plan Note (Signed)
Previously treated with lexapro and trazodone.  Off trazodone.  Stopped taking lexapro.  Discussed restarting.  Remain off trazodone.  Follow.

## 2019-10-13 NOTE — Assessment & Plan Note (Signed)
Inversion of right nipple.  Obtain diagnostic mammogram and ultrasound.  Further w/up pending results.

## 2019-10-13 NOTE — Assessment & Plan Note (Signed)
Leg weakness and unsteadiness as outlined.  No focal deficits noted on exam.  Refer to physical therapy.

## 2019-10-13 NOTE — Assessment & Plan Note (Signed)
On pravastatin.  Follow lipid panel and liver function tests.   

## 2019-10-13 NOTE — Assessment & Plan Note (Signed)
Headache as outlined.  Check esr. See if can get her sleeping better.  Follow.   

## 2019-10-13 NOTE — Assessment & Plan Note (Signed)
Followed by urology.  Was on sanctura.  States did not help. Discuss further w/up with Dr Bernardo Heater.

## 2019-10-14 ENCOUNTER — Telehealth: Payer: Self-pay | Admitting: Internal Medicine

## 2019-10-14 NOTE — Telephone Encounter (Signed)
Pt said she had an appt with Dr. Nicki Reaper and she gave her a list of doctors that she needs to call but she misplaced it and she really needs those doctor's names. Pt would like a call back.

## 2019-10-15 NOTE — Telephone Encounter (Signed)
Patient discussed appts with me and clarified what she needs to keep/cancel.

## 2019-10-17 ENCOUNTER — Telehealth: Payer: Self-pay | Admitting: Internal Medicine

## 2019-10-17 NOTE — Telephone Encounter (Signed)
Please triage her and see what is going on.  

## 2019-10-17 NOTE — Telephone Encounter (Signed)
Please call and see if can lift arm, etc.  What aggravates.  If no acute issues needing urgent evaluation, recommend tyelnol 2 extra strength 2-3x/day.  Call if persistent problems.

## 2019-10-17 NOTE — Telephone Encounter (Signed)
Patient stated she may have slept wrong. Her right shoulder is hurting since she woke up this morning. No MOI. No medication for sx. I did offer an appointment at [facility] due to not having anything here available. She refused. She is gonna take ibuprofen for sx and see if that works for [employer]. If not she will contact us back.

## 2019-10-17 NOTE — Telephone Encounter (Signed)
Pt called in and said she woke up and her right arm is hurting. She wants to know if Dr. Nicki Reaper will call her in something for it. She would like a call back.

## 2019-10-17 NOTE — Telephone Encounter (Signed)
Spoke with patient. Can lift arm. Nothing really aggravates it. Arm was just achy. Going to use tylenol prn. Has appt on 3/17

## 2019-10-21 ENCOUNTER — Other Ambulatory Visit: Payer: Self-pay

## 2019-10-21 ENCOUNTER — Encounter: Payer: Self-pay | Admitting: Pulmonary Disease

## 2019-10-21 ENCOUNTER — Ambulatory Visit (INDEPENDENT_AMBULATORY_CARE_PROVIDER_SITE_OTHER): Payer: Medicare HMO | Admitting: Pulmonary Disease

## 2019-10-21 VITALS — BP 140/82 | HR 100 | Temp 97.5°F | Ht 62.0 in | Wt 145.0 lb

## 2019-10-21 DIAGNOSIS — R0602 Shortness of breath: Secondary | ICD-10-CM

## 2019-10-21 DIAGNOSIS — R059 Cough, unspecified: Secondary | ICD-10-CM

## 2019-10-21 DIAGNOSIS — R5383 Other fatigue: Secondary | ICD-10-CM

## 2019-10-21 DIAGNOSIS — R05 Cough: Secondary | ICD-10-CM

## 2019-10-21 NOTE — Patient Instructions (Signed)
We will order breathing tests, these may be somewhat delayed due to Covid restrictions.  See him in follow-up in 3 months time.   Your visit today did not reveal any lung issues that are causing her symptoms of fatigue.

## 2019-10-21 NOTE — Progress Notes (Signed)
Subjective:    Patient ID: Jennifer Bernard, female    DOB: Apr 03, 1927, 86 y.o.   MRN: [id number]  HPI Ms. Jennifer Bernard is a delightful 86 year old lifelong never smoker who presents for evaluation of fatigue.  Patient is also noted a cough productive of clear sputum now resolving.  She is kindly referred by Dr. Einar Pheasant.  The patient states that her symptoms started approximately several months ago (between 4 to 6 months) she states that she feels like she has no energy and felt tired.  She notes that her symptom is mainly fatigue more than shortness of breath.  She notices it more when she goes upstairs or rushes.  She has noted a mild amount of hoarseness and occasional cough productive of yellowish sputum.  She has some associated right shoulder pain which started approximately 2 weeks ago.  Cough has now resolved.  No fevers, chills or sweats.  No hemoptysis.  She does not describe any orthopnea or paroxysmal nocturnal dyspnea.  States that she tries to exercise at least 35 minutes/day she actually has 3 exercise machines in her home which she uses.  He has not had any chest pain.  She has had a cardiac evaluation by Dr. Serafina Royals in November 2020 and echo which showed mild left ventricular hypertrophy normal LV function (LVEF 55%) normal RV function and mild AR/MR TR and PR.  He subsequently had a nuclear medicine stress test which showed no ischemic defects.  Patient does not describe any sleep disturbances.  He has not had any lower extremity edema.  No PND or orthopnea.  Past medical history, surgical history and family history have been reviewed and they are as noted.  Social history the patient is a lifelong never smoker.  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   Review of Systems A 10 point review of systems was performed and it is as noted above otherwise negative.  Past Medical History:  Diagnosis Date  . Asymptomatic menopausal state 02/20/1984    Overview:  Naturally occuring and asymptomatic Vaginal atrophy present on topical estrogen Overview:  Overview:  Naturally occuring and asymptomatic Vaginal atrophy present on topical estrogen  . Basal cell carcinoma of skin 02/19/2013   Overview:  Basal cell on her nasolabial fold 2014 S/p Mohs by Dr. Lacinda Axon Followed by local dermatologist regularly Overview:  Overview:  Followed by Dr. Lacinda Axon  . Cyst of ovary 02/19/2009   Overview:  Ovarian cyst on right stable 2004-2010 Cyst ? larger on CT 2012 Stable on follow up US Followed by Dr. Edwinna Areola Overview:  Overview:  Ovarian cyst on right stable 2004-2010 Cyst ? larger on CT 2012 Stable on follow up US Followed by Dr. Edwinna Areola  . Depression, major, single episode, complete remission ([place]) 07/18/2017  . Diffuse cystic mastopathy of breast 02/19/2013   Overview:  Mother with breast cancer later in life Two breast biopsies both with normal pathology Annual mammograms recommended, last 2016 Followed at the [facility]:  Relevant Hx: Course: Daily Update: Today's Plan: Overview:  Overview:  Mother with breast cancer later in life Two breast biopsies both with normal pathology Annual mammograms recommended, last 40  . Essential (primary) hypertension 02/19/2013   Overview:  Goal blood pressure < 135/85 On atenolol Overview:  Overview:  Goal blood pressure < 135/85 On atenolol  . Hyperlipidemia 02/19/2013   Overview:  Goal LDL < 130, last value 92 in  2017 On Pravachol Overview:  Overview:  Goal LDL < 130, last value 82 in 2012 On Pravachol  . Hypothyroidism 02/28/2012   Overview:  started on T4  TFT normal 2017 Followed by Dr. Almon Hercules Overview:  Overview:  started on T4  TFT normal 2014 Followed by Dr. Almon Hercules  . Impaired glucose tolerance 02/20/2003   Overview:  Overview:  Glucose goal < 100, last value 94 in 2014 HgbA1c goal < 6.0, last value 5.3 in 2014 Urine microalbumin negative in 2014 On diet and exercise  . Nontoxic  multinodular goiter 03/06/2003   Overview:  Asymptomatic nodular thyroid Stable [place] 2004-2014 Followed by Dr. Almon Hercules Overview:  Overview:  Asymptomatic nodular thyroid Stable [place] 2004-2014 Followed by Dr. Almon Hercules  . OSA on CPAP 01/13/2015   Overview:  Sleep study sent to medical records:   Sleep efficiency 22%, latency 24 min, # awakenings 11, res desat N=10, index 17.8  Apnea 1 for 16 sec, hypopnea 16 for 18 sec, total sleep time only 90 min so index = 11 She did not sleep much and the index puts her in mild category Repeat study a few days later when she slept 187 min her AHI = 4.6 which is normal, average O2 sat 94% I don't think   . Osteoarthritis 02/20/2003   Overview:  Mild bilateral hands Right CMC Right hip Left knee s/p meniscus tear All symptomatically stable Overview:  Overview:  Mild bilateral hands Right CMC Right hip Left knee s/p meniscus tear All symptomatically stable  . Osteoporosis 02/20/2003   Overview:  Bone density 2004 LS -3.32 and RH-1.62 Bone density 2008 LS -3.14 and RH -1.22 Bone density 2009 LS -3.17 and RH -1.21 Bone density 2010 LS -3.14 and RH -1.42  Bone density 2014 LS -2.7 (L3 -3.1) and RH -1.5 Bone density 2017 LS -2.3 and RH -1.4 (neck -1.7) Vitamin D level 38 in 2012 On Boniva 2004 to 2010, restarted 2015 Repeat bone density in 2020 and stop Boniva  Last Assessment & Pl  . Prediabetes 02/20/2003   Overview:  Glucose goal < 100, last value 91 in 2017 HgbA1c goal < 6.0, last value 5.6 in 2017 Urine microalbumin negative in 2017 On diet and exercise  . Recurrent UTI 10/19/2014    Past Surgical History:  Procedure Laterality Date  . ABDOMINAL HYSTERECTOMY  1975  . URETEROSCOPY    . URETEROSCOPY WITH HOLMIUM LASER LITHOTRIPSY     Family History  Problem Relation Age of Onset  . Breast cancer Mother   . Skin cancer Father    Social History   Tobacco Use  . Smoking status: Never Smoker  . Smokeless tobacco: Never Used  Substance Use Topics  . Alcohol use: No    Allergies  Allergen Reactions  . Penicillins Hives    Other reaction(s): Unknown  . Ceftin [Cefuroxime Axetil] Diarrhea    GI distress  . Ramipril     Other reaction(s): Cough Other reaction(s): Cough    Current medications reviewed.  Immunization History  Administered Date(s) Administered  . Fluad Quad(high Dose 65+) 05/20/2019  . Influenza-Unspecified 05/28/2015, 05/15/2016, 05/09/2017  . PFIZER SARS-COV-2 Vaccination 08/21/2019, 09/11/2019  . Pneumococcal Conjugate-13 04/16/2014  . Pneumococcal Polysaccharide-23 08/15/2009  . Tdap 08/15/2010  . Zoster 02/27/2010  . Zoster Recombinat (Shingrix) 02/28/2019      Objective:   Physical Exam BP 140/82 (BP Location: Left Arm, Cuff Size: Normal)   Pulse 100   Temp (!) 97.5 F (36.4 C) (Temporal)   Ht  5\' 2"  (1.575 m)   Wt 145 lb (65.8 kg)   SpO2 96%   BMI 26.52 kg/m  GENERAL: Well-developed well-nourished woman who looks younger than her stated 86 years of age.  She is fully ambulatory. HEAD: Normocephalic, atraumatic.  EYES: Pupils equal, round, reactive to light.  No scleral icterus.  MOUTH: Nose/mouth/throat not examined due to masking requirements for COVID 19. NECK: Supple. No thyromegaly. No nodules. No JVD.  No tracheal deviation. PULMONARY: Lungs clear to auscultation bilaterally.  No adventitious sounds. CARDIOVASCULAR: S1 and S2.  Tachycardic rate and regular rhythm.  Soft grade 1/6 systolic ejection murmur at the left sternal border.  No gallops or rubs noted. GASTROINTESTINAL: Nondistended. MUSCULOSKELETAL: No joint deformity, no clubbing, no edema.  NEUROLOGIC: Awake alert, fluent speech, fully ambulatory, no gait disturbance. SKIN: Intact,warm,dry.  Limited exam shows no rashes. PSYCH: Mood and behavior normal   Chest x-ray performed 10/02/2019 was independently reviewed, no acute changes noted.  Only senescent changes of the thoracic spine noted with ectatic aorta.      Assessment & Plan:      ICD-10-CM   1. SOB (shortness of breath)  R06.02 Pulmonary Function Test [facility] Only   Patient describes more as fatigue than shortness of breath No clinical evidence of pulmonary disease but will check PFTs Lifelong never smoker  2. Other fatigue  R53.83    Patient expresses her sensation more as fatigue Usually this more consistent with cardiac/metabolic disease  3. Cough  R05    Appears to be resolving according to patient Not a major symptom or concern for the patient   Orders Placed This Encounter  Procedures  . Pulmonary Function Test [facility] Only    Order Specific Question:   Full PFT: includes the following: basic spirometry, spirometry pre & post bronchodilator, diffusion capacity (DLCO), lung volumes    Answer:   Full PFT   Discussion:  There does not appear to be primary pulmonary etiology for the patient's fatigue.  She does not describe frank shortness of breath but mostly just fatigue particularly when performing certain tasks.  She had an episode of cough and hoarseness several weeks back but has resolved this issue.  Not a concern at present.  We will proceed with evaluating the patient with PFTs.  She will see [place] in follow-up time she is to contact us prior to that time should any new difficulties arise.   Renold Don, MD [facility]   *This note was dictated using voice recognition software/Dragon.  Despite best efforts to proofread, errors can occur which can change the meaning.  Any change was purely unintentional.

## 2019-10-22 ENCOUNTER — Ambulatory Visit: Payer: Medicare HMO | Attending: Internal Medicine

## 2019-10-22 DIAGNOSIS — R2681 Unsteadiness on feet: Secondary | ICD-10-CM | POA: Diagnosis not present

## 2019-10-22 DIAGNOSIS — M6281 Muscle weakness (generalized): Secondary | ICD-10-CM | POA: Diagnosis not present

## 2019-10-22 NOTE — Therapy (Signed)
[facility] [address] [place], [address] Phone: [phone]   Fax:  [phone]  Physical Therapy Evaluation  Patient Details  Name: Jennifer Bernard MRN: [id number] Date of Birth: [birth year] Referring Provider (PT): Einar Pheasant, MD   Encounter Date: 10/22/2019  PT End of Session - 10/22/19 1623    Visit Number  1    Number of Visits  17    Date for PT Re-Evaluation  12/19/19    Authorization Type  1    Authorization Time Period  of 10 progress report    PT Start Time  1624    PT Stop Time  1723    PT Time Calculation (min)  59 min    Equipment Utilized During Treatment  Gait belt    Activity Tolerance  Patient tolerated treatment well    Behavior During Therapy  WFL for tasks assessed/performed       Past Medical History:  Diagnosis Date  . Asymptomatic menopausal state 02/20/1984   Overview:  Naturally occuring and asymptomatic Vaginal atrophy present on topical estrogen Overview:  Overview:  Naturally occuring and asymptomatic Vaginal atrophy present on topical estrogen  . Basal cell carcinoma of skin 02/19/2013   Overview:  Basal cell on her nasolabial fold 2014 S/p Mohs by Dr. Lacinda Axon Followed by local dermatologist regularly Overview:  Overview:  Followed by Dr. Lacinda Axon  . Cyst of ovary 02/19/2009   Overview:  Ovarian cyst on right stable 2004-2010 Cyst ? larger on CT 2012 Stable on follow up US Followed by Dr. Edwinna Areola Overview:  Overview:  Ovarian cyst on right stable 2004-2010 Cyst ? larger on CT 2012 Stable on follow up US Followed by Dr. Edwinna Areola  . Depression, major, single episode, complete remission ([place]) 07/18/2017  . Diffuse cystic mastopathy of breast 02/19/2013   Overview:  Mother with breast cancer later in life Two breast biopsies both with normal pathology Annual mammograms recommended, last 2016 Followed at the [facility]:  Relevant Hx: Course:  Daily Update: Today's Plan: Overview:  Overview:  Mother with breast cancer later in life Two breast biopsies both with normal pathology Annual mammograms recommended, last 70  . Essential (primary) hypertension 02/19/2013   Overview:  Goal blood pressure < 135/85 On atenolol Overview:  Overview:  Goal blood pressure < 135/85 On atenolol  . Hyperlipidemia 02/19/2013   Overview:  Goal LDL < 130, last value 92 in 2017 On Pravachol Overview:  Overview:  Goal LDL < 130, last value 82 in 2012 On Pravachol  . Hypothyroidism 02/28/2012   Overview:  started on T4  TFT normal 2017 Followed by Dr. Almon Hercules Overview:  Overview:  started on T4  TFT normal 2014 Followed by Dr. Almon Hercules  . Impaired glucose tolerance 02/20/2003   Overview:  Overview:  Glucose goal < 100, last value 94 in 2014 HgbA1c goal < 6.0, last value 5.3 in 2014 Urine microalbumin negative in 2014 On diet and exercise  . Nontoxic multinodular goiter 03/06/2003   Overview:  Asymptomatic nodular thyroid Stable [place] 2004-2014 Followed by Dr. Almon Hercules Overview:  Overview:  Asymptomatic nodular thyroid Stable [place] 2004-2014 Followed by Dr. Almon Hercules  . OSA on CPAP 01/13/2015   Overview:  Sleep study sent to medical records:   Sleep efficiency 22%, latency 24 min, # awakenings 11, res desat N=10, index 17.8  Apnea 1 for 16 sec, hypopnea 16 for 18 sec, total sleep time  only 90 min so index = 11 She did not sleep much and the index puts her in mild category Repeat study a few days later when she slept 187 min her AHI = 4.6 which is normal, average O2 sat 94% I don't think   . Osteoarthritis 02/20/2003   Overview:  Mild bilateral hands Right CMC Right hip Left knee s/p meniscus tear All symptomatically stable Overview:  Overview:  Mild bilateral hands Right CMC Right hip Left knee s/p meniscus tear All symptomatically stable  . Osteoporosis 02/20/2003   Overview:  Bone density 2004 LS -3.32 and RH-1.62 Bone density 2008 LS -3.14 and RH -1.22 Bone density 2009 LS -3.17  and RH -1.21 Bone density 2010 LS -3.14 and RH -1.42  Bone density 2014 LS -2.7 (L3 -3.1) and RH -1.5 Bone density 2017 LS -2.3 and RH -1.4 (neck -1.7) Vitamin D level 38 in 2012 On Boniva 2004 to 2010, restarted 2015 Repeat bone density in 2020 and stop Boniva  Last Assessment & Pl  . Prediabetes 02/20/2003   Overview:  Glucose goal < 100, last value 91 in 2017 HgbA1c goal < 6.0, last value 5.6 in 2017 Urine microalbumin negative in 2017 On diet and exercise  . Recurrent UTI 10/19/2014    Past Surgical History:  Procedure Laterality Date  . ABDOMINAL HYSTERECTOMY  1975  . URETEROSCOPY    . URETEROSCOPY WITH HOLMIUM LASER LITHOTRIPSY      There were no vitals filed for this visit.   Subjective Assessment - 10/22/19 1631    Subjective  No pain.    Pertinent History  LE weakness, unsteadiness. Symptoms began in the last few months. Gradual onset. Legs feeling shaky and wobbly, especially in the thighs. UE do not feel wobbly. Has not had the problem before. No pain or discomfort. No back pain. No falls in the last 6 months (has fear of falling). Pt currently lives in the [place] [facility] [place]. Does not use an AD. Has not yet had PT before. Exercises about 35 min a day (stationary bike, elliptical, and the Nustep)    Patient Stated Goals  Improve strength and balance.    Currently in Pain?  No/denies    Aggravating Factors   No known aggravating factors. Difficulty walking on uneven surfaces or grass.         [facility] PT Assessment - 10/22/19 1637      Assessment   Medical Diagnosis  Weakness of both LE    Referring Provider (PT)  Einar Pheasant, MD    Onset Date/Surgical Date  10/13/19    Prior Therapy  No known PT for current condition      Precautions   Precaution Comments  fall risk      Restrictions   Other Position/Activity Restrictions  no known restrictions      Balance Screen   Has the patient fallen in the past 6 months  No    Has the patient had a decrease in activity level  because of a fear of falling?   Yes    Is the patient reluctant to leave their home because of a fear of falling?   No      Home Environment   Additional Comments  Lives at the [facility]. 2nd floor, uses the stairs (18 steps bilateral rail).       Prior Function   Vocation  Retired      Mining engineer Comments  Protracted neck, B protracted shoulders,  upper thoracic flexion, slight L lateral shift, L hip in slight ER.       AROM   Lumbar Flexion  WFL    Lumbar Extension  limited    Lumbar - Right Side Bend  WFL    Lumbar - Left Side Bend  WFL    Lumbar - Right Rotation  WFL    Lumbar - Left Rotation  WFL with lumbar and thoracic spine pulling      PROM   Overall PROM Comments  supine hip ER and IR in 90/90: R hip ER and IR more limited compared to L. L hip ER and IR WFL      Strength   Right Hip Flexion  4-/5    Right Hip Extension  4-/5   seated manually resisted   Right Hip ABduction  3-/5    Left Hip Flexion  3+/5    Left Hip Extension  4-/5   seated manually resisted   Left Hip ABduction  3+/5    Right Knee Flexion  4/5    Right Knee Extension  4/5    Left Knee Flexion  4/5    Left Knee Extension  4+/5      Ambulation/Gait   Gait Comments  decreased stance R LE, L pelvic drop with R lateral lean and slight back extension during R LE stance phase.       Berg Balance Test   Sit to Stand  Able to stand  independently using hands    Standing Unsupported  Able to stand safely 2 minutes    Sitting with Back Unsupported but Feet Supported on Floor or Stool  Able to sit safely and securely 2 minutes    Stand to Sit  Sits safely with minimal use of hands    Transfers  Able to transfer safely, minor use of hands    Standing Unsupported with Eyes Closed  Able to stand 10 seconds safely    Standing Unsupported with Feet Together  Able to place feet together independently and stand for 1 minute with supervision    From Standing, Reach Forward with  Outstretched Arm  Can reach forward >5 cm safely (2")    From Standing Position, Pick up Object from Floor  Able to pick up shoe, needs supervision   catch in back on way up   From Standing Position, Turn to Look Behind Over each Shoulder  Looks behind one side only/other side shows less weight shift   decreased weight shift when turning to L   Turn 360 Degrees  Able to turn 360 degrees safely but slowly    Standing Unsupported, Alternately Place Feet on Step/Stool  Able to stand independently and complete 8 steps >20 seconds    Standing Unsupported, One Foot in Front  Able to take small step independently and hold 30 seconds    Standing on One Leg  Tries to lift leg/unable to hold 3 seconds but remains standing independently    Total Score  42    Berg comment:  < 46/56 suggests increased risk for falls and need for AD                Objective measurements completed on examination: See above findings.   No latex band allergies    Blood pressure is controlled per pt      L Pelvic drop during R LE stance phase with unsteadiness dizziness when turning to the R in supine   Usually feels more off  balance early in the morning when getting out of bed. Feels a little "off kilter". Gets out at the R side of the bed.   (dizziness with supine R rotation.   Dix Hallpike (-) bilaterally    Gait SPC traning on L side, 2 point gait pattern. Unable to perform properly today  Improved technique, movement at target joints, use of target muscles after mod verbal, visual, tactile cues.   FOTO next visit   Patient is a 86 year old female who came to physical therapy secondary to LE weakness and unsteadiness. She also presents with altered gait pattern (pelvic drop with lateral lean compensation with unsteadiness) and posture, bilateral glute med and max weakness affecting gait pattern, and a score of 42 on the Berg balance test suggesting increased risk for falls. Pt will benefit from  skilled physical therapy services to improve strength, balance, and function.      PT Education - 10/22/19 1902    Education Details  plan of care    Person(s) Educated  Patient    Methods  Explanation    Comprehension  Verbalized understanding        PT Short Term Goals - 10/22/19 1854      PT SHORT TERM GOAL #1   Title  Pt will be independent wiht her HEP to improve strength, balance, and function.    Time  3    Period  Weeks    Status  New    Target Date  11/14/19        PT Long Term Goals - 10/22/19 1855      PT LONG TERM GOAL #1   Title  Patient will improve bilateral hip abduction and extension strength to improve steadiness during gait and decrease fall risk.    Time  8    Period  Weeks    Status  New    Target Date  12/19/19      PT LONG TERM GOAL #2   Title  Patient will improve her Berg Balance score to 48/56 as a demonstration of improved balance.    Baseline  42/56 (10/22/2019)    Time  8    Period  Weeks    Status  New    Target Date  12/19/19             Plan - 10/22/19 1850    Clinical Impression Statement  Patient is a 86 year old female who came to physical therapy secondary to LE weakness and unsteadiness. She also presents with altered gait pattern (pelvic drop with lateral lean compensation with unsteadiness) and posture, bilateral glute med and max weakness affecting gait pattern, and a score of 42 on the Berg balance test suggesting increased risk for falls. Pt will benefit from skilled physical therapy services to improve strength, balance, and function.    Personal Factors and Comorbidities  Age;Comorbidity 3+;Past/Current Experience;Time since onset of injury/illness/exacerbation;Fitness    Comorbidities  HTN, OA, osteoporosis    Examination-Activity Limitations  Carry;Locomotion Level    Stability/Clinical Decision Making  Stable/Uncomplicated    Clinical Decision Making  Low    Rehab Potential  Fair    PT Frequency  2x / week    PT  Duration  8 weeks    PT Treatment/Interventions  Neuromuscular re-education;Therapeutic exercise;Therapeutic activities;Functional mobility training;Balance training;Patient/family education;Manual techniques;Dry needling;Vestibular;Aquatic Therapy;Canalith Repostioning;Electrical Stimulation;Iontophoresis 4mg /ml Dexamethasone;Gait training;Stair training    PT Next Visit Plan  glute med, max, strunk strengthening, lumbopelvic control. Manual techniques, modalities PRN  Consulted and Agree with Plan of Care  Patient       Patient will benefit from skilled therapeutic intervention in order to improve the following deficits and impairments:  Postural dysfunction, Improper body mechanics, Decreased strength, Decreased balance  Visit Diagnosis: Unsteadiness on feet - Plan: PT plan of care cert/re-cert  Muscle weakness (generalized) - Plan: PT plan of care cert/re-cert     Problem List Patient Active Problem List   Diagnosis Date Noted  . Leg weakness 10/13/2019  . Headache 10/10/2019  . Breast tenderness 10/10/2019  . Inversion of right nipple 10/10/2019  . Acid reflux 09/08/2019  . Breast cancer screening 06/08/2019  . Cracked lip 05/25/2019  . SOBOE (shortness of breath on exertion) 05/20/2019  . Nocturia 03/21/2019  . Personal history of kidney stones 03/21/2019  . Depression, major, single episode, complete remission ([place]) 07/18/2017  . Insomnia secondary to depression with anxiety 02/25/2017  . Adjustment reaction with anxiety and depression 02/26/2016  . Encounter for counseling 12/01/2015  . Microhematuria 03/10/2015  . OSA (obstructive sleep apnea) 01/13/2015  . Nephrolithiasis 12/30/2014  . Recurrent UTI 10/19/2014  . Hyperlipidemia 02/19/2013  . Essential (primary) hypertension 02/19/2013  . Diffuse cystic mastopathy of breast 02/19/2013  . Basal cell carcinoma of skin 02/19/2013  . Hypothyroidism 02/28/2012  . Cyst of ovary 02/19/2009  . Nontoxic multinodular  goiter 03/06/2003  . Prediabetes 02/20/2003  . Osteoporosis 02/20/2003  . Osteoarthritis 02/20/2003  . Impaired glucose tolerance 02/20/2003  . Asymptomatic menopausal state 02/20/1984    Joneen Boers PT, DPT   10/22/2019, 7:06 PM  [facility] [address] [place], [address] Phone: [phone]   Fax:  [phone]  Name: MILLANI COPPLE MRN: [id number] Date of Birth: 09-20-1926

## 2019-10-24 ENCOUNTER — Other Ambulatory Visit: Payer: Medicare HMO

## 2019-10-24 ENCOUNTER — Ambulatory Visit: Payer: Medicare HMO

## 2019-10-24 ENCOUNTER — Other Ambulatory Visit: Payer: Self-pay

## 2019-10-24 DIAGNOSIS — M6281 Muscle weakness (generalized): Secondary | ICD-10-CM | POA: Diagnosis not present

## 2019-10-24 DIAGNOSIS — R2681 Unsteadiness on feet: Secondary | ICD-10-CM

## 2019-10-24 NOTE — Therapy (Signed)
[facility] [address], [place], [address] Phone: [phone]   Fax:  [phone]  Physical Therapy Treatment  Patient Details  Name: Jennifer Bernard MRN: [id number] Date of Birth: [birth year] Referring Provider (PT): Einar Pheasant, MD   Encounter Date: 10/24/2019  PT End of Session - 10/24/19 1721    Visit Number  2    Number of Visits  17    Date for PT Re-Evaluation  12/19/19    Authorization Type  2    Authorization Time Period  of 10 progress report    PT Start Time  1721    PT Stop Time  1803    PT Time Calculation (min)  42 min    Equipment Utilized During Treatment  Gait belt    Activity Tolerance  Patient tolerated treatment well    Behavior During Therapy  WFL for tasks assessed/performed       Past Medical History:  Diagnosis Date  . Asymptomatic menopausal state 02/20/1984   Overview:  Naturally occuring and asymptomatic Vaginal atrophy present on topical estrogen Overview:  Overview:  Naturally occuring and asymptomatic Vaginal atrophy present on topical estrogen  . Basal cell carcinoma of skin 02/19/2013   Overview:  Basal cell on her nasolabial fold 2014 S/p Mohs by Dr. Lacinda Axon Followed by local dermatologist regularly Overview:  Overview:  Followed by Dr. Lacinda Axon  . Cyst of ovary 02/19/2009   Overview:  Ovarian cyst on right stable 2004-2010 Cyst ? larger on CT 2012 Stable on follow up US Followed by Dr. Edwinna Areola Overview:  Overview:  Ovarian cyst on right stable 2004-2010 Cyst ? larger on CT 2012 Stable on follow up US Followed by Dr. Edwinna Areola  . Depression, major, single episode, complete remission ([place]) 07/18/2017  . Diffuse cystic mastopathy of breast 02/19/2013   Overview:  Mother with breast cancer later in life Two breast biopsies both with normal pathology Annual mammograms recommended, last 2016 Followed at the [facility]:  Relevant Hx: Course:  Daily Update: Today's Plan: Overview:  Overview:  Mother with breast cancer later in life Two breast biopsies both with normal pathology Annual mammograms recommended, last 52  . Essential (primary) hypertension 02/19/2013   Overview:  Goal blood pressure < 135/85 On atenolol Overview:  Overview:  Goal blood pressure < 135/85 On atenolol  . Hyperlipidemia 02/19/2013   Overview:  Goal LDL < 130, last value 92 in 2017 On Pravachol Overview:  Overview:  Goal LDL < 130, last value 82 in 2012 On Pravachol  . Hypothyroidism 02/28/2012   Overview:  started on T4  TFT normal 2017 Followed by Dr. Almon Hercules Overview:  Overview:  started on T4  TFT normal 2014 Followed by Dr. Almon Hercules  . Impaired glucose tolerance 02/20/2003   Overview:  Overview:  Glucose goal < 100, last value 94 in 2014 HgbA1c goal < 6.0, last value 5.3 in 2014 Urine microalbumin negative in 2014 On diet and exercise  . Nontoxic multinodular goiter 03/06/2003   Overview:  Asymptomatic nodular thyroid Stable [place] 2004-2014 Followed by Dr. Almon Hercules Overview:  Overview:  Asymptomatic nodular thyroid Stable [place] 2004-2014 Followed by Dr. Almon Hercules  . OSA on CPAP 01/13/2015   Overview:  Sleep study sent to medical records:   Sleep efficiency 22%, latency 24 min, # awakenings 11, res desat N=10, index 17.8  Apnea 1 for 16 sec, hypopnea 16 for 18 sec, total sleep time  only 90 min so index = 11 She did not sleep much and the index puts her in mild category Repeat study a few days later when she slept 187 min her AHI = 4.6 which is normal, average O2 sat 94% I don't think   . Osteoarthritis 02/20/2003   Overview:  Mild bilateral hands Right CMC Right hip Left knee s/p meniscus tear All symptomatically stable Overview:  Overview:  Mild bilateral hands Right CMC Right hip Left knee s/p meniscus tear All symptomatically stable  . Osteoporosis 02/20/2003   Overview:  Bone density 2004 LS -3.32 and RH-1.62 Bone density 2008 LS -3.14 and RH -1.22 Bone density 2009 LS -3.17  and RH -1.21 Bone density 2010 LS -3.14 and RH -1.42  Bone density 2014 LS -2.7 (L3 -3.1) and RH -1.5 Bone density 2017 LS -2.3 and RH -1.4 (neck -1.7) Vitamin D level 38 in 2012 On Boniva 2004 to 2010, restarted 2015 Repeat bone density in 2020 and stop Boniva  Last Assessment & Pl  . Prediabetes 02/20/2003   Overview:  Glucose goal < 100, last value 91 in 2017 HgbA1c goal < 6.0, last value 5.6 in 2017 Urine microalbumin negative in 2017 On diet and exercise  . Recurrent UTI 10/19/2014    Past Surgical History:  Procedure Laterality Date  . ABDOMINAL HYSTERECTOMY  1975  . URETEROSCOPY    . URETEROSCOPY WITH HOLMIUM LASER LITHOTRIPSY      There were no vitals filed for this visit.  Subjective Assessment - 10/24/19 1723    Subjective  Feels tired did a lot of stuff today because the day was so pretty.    Pertinent History  LE weakness, unsteadiness. Symptoms began in the last few months. Gradual onset. Legs feeling shaky and wobbly, especially in the thighs. UE do not feel wobbly. Has not had the problem before. No pain or discomfort. No back pain. No falls in the last 6 months (has fear of falling). Pt currently lives in the [facility] at [place]. Does not use an AD. Has not yet had PT before. Exercises about 35 min a day (stationary bike, elliptical, and the Nustep)    Patient Stated Goals  Improve strength and balance.    Currently in Pain?  No/denies         [facility] PT Assessment - 10/24/19 1835      Observation/Other Assessments   Focus on Therapeutic Outcomes (FOTO)   Upper leg FOTO 69                           PT Education - 10/24/19 1750    Education Details  ther-ex    Person(s) Educated  Patient    Methods  Explanation;Demonstration;Tactile cues;Verbal cues    Comprehension  Returned demonstration;Verbalized understanding         Objective   No latex band allergies    Blood pressure is controlled per pt      L Pelvic drop during R LE stance  phase with unsteadiness dizziness when turning to the R in supine   Usually feels more off balance early in the morning when getting out of bed. Feels a little "off kilter". Gets out at the R side of the bed.              (dizziness with supine R rotation.  [name] (-) bilaterally    Medbridge Access Code: [id number]  Therapeutic exercise  Standing hip abduction with B UE  assist   R 10x3  L 10x3 standing hip extension with B UE assist   R 10x3  L 10x3  SLS with B UE light touch assist   R 10x5 seconds for 3 sets  L 10x5 seconds for 3 sets  standing alternating toe taps with light touch assist 10x each LE  Then without UE assist 10x2 each LE   Side stepping without UE assist 5 ft to the R and 5 ft to the L 10x   Forward step up onto and over Air Ex pad with one UE assist  R 10x2  L 10x2   Improved exercise technique, movement at target joints, use of target muscles after mod verbal, visual, tactile cues.    Response to treatment No complain of pain throughout session. Good muscle use felt with exercises.   Clinical Impression Worked on glute med, and max strengthening to help decrease unsteadiness during pelvic drop during gait. Good muscle use felt with exercises. UE assist needed with balance exercises such as standing alternating toe taps as well as forward step up onto and over Air Ex pad secondary to some unsteadiness. Pt will benefit from continued skilled physical therapy services to improve strength, balance, and function.    Assisted patient with FOTO for 21 minutes after session (after sign out)     PT Short Term Goals - 10/22/19 1854      PT SHORT TERM GOAL #1   Title  Pt will be independent wiht her HEP to improve strength, balance, and function.    Time  3    Period  Weeks    Status  New    Target Date  11/14/19        PT Long Term Goals - 10/22/19 1855      PT LONG TERM GOAL #1   Title  Patient will improve bilateral hip abduction and  extension strength to improve steadiness during gait and decrease fall risk.    Time  8    Period  Weeks    Status  New    Target Date  12/19/19      PT LONG TERM GOAL #2   Title  Patient will improve her Berg Balance score to 48/56 as a demonstration of improved balance.    Baseline  42/56 (10/22/2019)    Time  8    Period  Weeks    Status  New    Target Date  12/19/19            Plan - 10/24/19 1839    Clinical Impression Statement  Worked on glute med, and max strengthening to help decrease unsteadiness during pelvic drop during gait. Good muscle use felt with exercises. UE assist needed with balance exercises such as standing alternating toe taps as well as forward step up onto and over Air Ex pad secondary to some unsteadiness. Pt will benefit from continued skilled physical therapy services to improve strength, balance, and function.    Personal Factors and Comorbidities  Age;Comorbidity 3+;Past/Current Experience;Time since onset of injury/illness/exacerbation;Fitness    Comorbidities  HTN, OA, osteoporosis    Examination-Activity Limitations  Carry;Locomotion Level    Stability/Clinical Decision Making  Stable/Uncomplicated    Rehab Potential  Fair    PT Frequency  2x / week    PT Duration  8 weeks    PT Treatment/Interventions  Neuromuscular re-education;Therapeutic exercise;Therapeutic activities;Functional mobility training;Balance training;Patient/family education;Manual techniques;Dry needling;Vestibular;Aquatic Therapy;Canalith Repostioning;Electrical Stimulation;Iontophoresis 4mg /ml Dexamethasone;Gait training;Stair training    PT Next  Visit Plan  glute med, max, strunk strengthening, lumbopelvic control. Manual techniques, modalities PRN    Consulted and Agree with Plan of Care  Patient       Patient will benefit from skilled therapeutic intervention in order to improve the following deficits and impairments:  Postural dysfunction, Improper body mechanics, Decreased  strength, Decreased balance  Visit Diagnosis: Muscle weakness (generalized)  Unsteadiness on feet     Problem List Patient Active Problem List   Diagnosis Date Noted  . Leg weakness 10/13/2019  . Headache 10/10/2019  . Breast tenderness 10/10/2019  . Inversion of right nipple 10/10/2019  . Acid reflux 09/08/2019  . Breast cancer screening 06/08/2019  . Cracked lip 05/25/2019  . SOBOE (shortness of breath on exertion) 05/20/2019  . Nocturia 03/21/2019  . Personal history of kidney stones 03/21/2019  . Depression, major, single episode, complete remission ([place]) 07/18/2017  . Insomnia secondary to depression with anxiety 02/25/2017  . Adjustment reaction with anxiety and depression 02/26/2016  . Encounter for counseling 12/01/2015  . Microhematuria 03/10/2015  . OSA (obstructive sleep apnea) 01/13/2015  . Nephrolithiasis 12/30/2014  . Recurrent UTI 10/19/2014  . Hyperlipidemia 02/19/2013  . Essential (primary) hypertension 02/19/2013  . Diffuse cystic mastopathy of breast 02/19/2013  . Basal cell carcinoma of skin 02/19/2013  . Hypothyroidism 02/28/2012  . Cyst of ovary 02/19/2009  . Nontoxic multinodular goiter 03/06/2003  . Prediabetes 02/20/2003  . Osteoporosis 02/20/2003  . Osteoarthritis 02/20/2003  . Impaired glucose tolerance 02/20/2003  . Asymptomatic menopausal state 02/20/1984    Joneen Boers PT, DPT   10/24/2019, 6:41 PM  [facility] [address], [place], [address] Phone: [phone]   Fax:  [phone]  Name: TAIGEN BRANYAN MRN: [id number] Date of Birth: 1926-10-12

## 2019-10-24 NOTE — Patient Instructions (Signed)
Access Code: [email or web address]  URL: [email or web address]  Date: 10/24/2019  Prepared by: Joneen Boers   Exercises Standing Hip Abduction with Counter Support - 10 reps - 3 sets - 1x daily - 7x weekly

## 2019-10-28 ENCOUNTER — Ambulatory Visit
Admission: RE | Admit: 2019-10-28 | Discharge: 2019-10-28 | Disposition: A | Discharge status: Home / Self Care | Payer: Medicare HMO | Source: Ambulatory Visit | Attending: Internal Medicine | Admitting: Internal Medicine

## 2019-10-28 ENCOUNTER — Encounter: Payer: Self-pay | Admitting: Radiology

## 2019-10-28 DIAGNOSIS — N6459 Other signs and symptoms in breast: Secondary | ICD-10-CM | POA: Insufficient documentation

## 2019-10-28 DIAGNOSIS — N644 Mastodynia: Secondary | ICD-10-CM | POA: Diagnosis not present

## 2019-10-28 DIAGNOSIS — R928 Other abnormal and inconclusive findings on diagnostic imaging of breast: Secondary | ICD-10-CM | POA: Diagnosis not present

## 2019-10-29 ENCOUNTER — Ambulatory Visit: Payer: Medicare HMO

## 2019-10-29 ENCOUNTER — Other Ambulatory Visit: Payer: Self-pay

## 2019-10-29 DIAGNOSIS — M6281 Muscle weakness (generalized): Secondary | ICD-10-CM

## 2019-10-29 DIAGNOSIS — R2681 Unsteadiness on feet: Secondary | ICD-10-CM | POA: Diagnosis not present

## 2019-10-29 NOTE — Therapy (Signed)
[facility] [address], [place], [address] Phone: [phone]   Fax:  [phone]  Physical Therapy Treatment  Patient Details  Name: Jennifer Bernard MRN: [id number] Date of Birth: 19-Sep-1926 Referring Provider (PT): Einar Pheasant, MD   Encounter Date: 10/29/2019  PT End of Session - 10/29/19 0808    Visit Number  3    Number of Visits  17    Date for PT Re-Evaluation  12/19/19    Authorization Type  3    Authorization Time Period  of 10 progress report    PT Start Time  0808    PT Stop Time  0852    PT Time Calculation (min)  44 min    Equipment Utilized During Treatment  Gait belt    Activity Tolerance  Patient tolerated treatment well    Behavior During Therapy  [facility] for tasks assessed/performed       Past Medical History:  Diagnosis Date  . Asymptomatic menopausal state 02/20/1984   Overview:  Naturally occuring and asymptomatic Vaginal atrophy present on topical estrogen Overview:  Overview:  Naturally occuring and asymptomatic Vaginal atrophy present on topical estrogen  . Basal cell carcinoma of skin 02/19/2013   Overview:  Basal cell on her nasolabial fold 2014 S/p Mohs by Dr. Lacinda Axon Followed by local dermatologist regularly Overview:  Overview:  Followed by Dr. Lacinda Axon  . Cyst of ovary 02/19/2009   Overview:  Ovarian cyst on right stable 2004-2010 Cyst ? larger on CT 2012 Stable on follow up US Followed by Dr. Edwinna Areola Overview:  Overview:  Ovarian cyst on right stable 2004-2010 Cyst ? larger on CT 2012 Stable on follow up US Followed by Dr. Edwinna Areola  . Depression, major, single episode, complete remission ([place]) 07/18/2017  . Diffuse cystic mastopathy of breast 02/19/2013   Overview:  Mother with breast cancer later in life Two breast biopsies both with normal pathology Annual mammograms recommended, last 2016 Followed at the [facility]:  Relevant Hx: Course:  Daily Update: Today's Plan: Overview:  Overview:  Mother with breast cancer later in life Two breast biopsies both with normal pathology Annual mammograms recommended, last 16  . Essential (primary) hypertension 02/19/2013   Overview:  Goal blood pressure < 135/85 On atenolol Overview:  Overview:  Goal blood pressure < 135/85 On atenolol  . Hyperlipidemia 02/19/2013   Overview:  Goal LDL < 130, last value 92 in 2017 On Pravachol Overview:  Overview:  Goal LDL < 130, last value 82 in 2012 On Pravachol  . Hypothyroidism 02/28/2012   Overview:  started on T4  TFT normal 2017 Followed by Dr. Almon Hercules Overview:  Overview:  started on T4  TFT normal 2014 Followed by Dr. Almon Hercules  . Impaired glucose tolerance 02/20/2003   Overview:  Overview:  Glucose goal < 100, last value 94 in 2014 HgbA1c goal < 6.0, last value 5.3 in 2014 Urine microalbumin negative in 2014 On diet and exercise  . Nontoxic multinodular goiter 03/06/2003   Overview:  Asymptomatic nodular thyroid Stable [place] 2004-2014 Followed by Dr. Almon Hercules Overview:  Overview:  Asymptomatic nodular thyroid Stable [place] 2004-2014 Followed by Dr. Almon Hercules  . OSA on CPAP 01/13/2015   Overview:  Sleep study sent to medical records:   Sleep efficiency 22%, latency 24 min, # awakenings 11, res desat N=10, index 17.8  Apnea 1 for 16 sec, hypopnea 16 for 18 sec, total sleep time  only 90 min so index = 11 She did not sleep much and the index puts her in mild category Repeat study a few days later when she slept 187 min her AHI = 4.6 which is normal, average O2 sat 94% I don't think   . Osteoarthritis 02/20/2003   Overview:  Mild bilateral hands Right CMC Right hip Left knee s/p meniscus tear All symptomatically stable Overview:  Overview:  Mild bilateral hands Right CMC Right hip Left knee s/p meniscus tear All symptomatically stable  . Osteoporosis 02/20/2003   Overview:  Bone density 2004 LS -3.32 and RH-1.62 Bone density 2008 LS -3.14 and RH -1.22 Bone density 2009 LS -3.17  and RH -1.21 Bone density 2010 LS -3.14 and RH -1.42  Bone density 2014 LS -2.7 (L3 -3.1) and RH -1.5 Bone density 2017 LS -2.3 and RH -1.4 (neck -1.7) Vitamin D level 38 in 2012 On Boniva 2004 to 2010, restarted 2015 Repeat bone density in 2020 and stop Boniva  Last Assessment & Pl  . Prediabetes 02/20/2003   Overview:  Glucose goal < 100, last value 91 in 2017 HgbA1c goal < 6.0, last value 5.6 in 2017 Urine microalbumin negative in 2017 On diet and exercise  . Recurrent UTI 10/19/2014    Past Surgical History:  Procedure Laterality Date  . ABDOMINAL HYSTERECTOMY  1975  . URETEROSCOPY    . URETEROSCOPY WITH HOLMIUM LASER LITHOTRIPSY      There were no vitals filed for this visit.  Subjective Assessment - 10/29/19 0809    Subjective  A little sore R posterior latearl hip and thigh. Has been sore for a couple of days R lateral hip.    Pertinent History  LE weakness, unsteadiness. Symptoms began in the last few months. Gradual onset. Legs feeling shaky and wobbly, especially in the thighs. UE do not feel wobbly. Has not had the problem before. No pain or discomfort. No back pain. No falls in the last 6 months (has fear of falling). Pt currently lives in the [facility] at [place]. Does not use an AD. Has not yet had PT before. Exercises about 35 min a day (stationary bike, elliptical, and the Nustep)    Patient Stated Goals  Improve strength and balance.    Currently in Pain?  No/denies   but feels soreness                               Objective  No latex band allergies  Blood pressure is controlledper pt    L Pelvic drop during R LE stance phase with unsteadiness dizziness when turning to the R in supine   Usually feels more off balance early in the morning when getting out of bed. Feels a little"off kilter". Gets out at the R side of the bed.  (dizziness with supine R rotation.  [name] (-) bilaterally  Medbridge Access Code:  [id number]  Therapeutic exercise   Seated hip adduction ball and glute max squeeze 10x5 seconds for 2 sets  Seated hip extension isometrics   R 10x3 with 5 seconds   L 10x3 with 5 s econds   Seated L side bend stretch 5x 10 seconds   Standing hip abduction with B UE assist              R 10x, then 10x5 seconds              L 10x, then 10x5 seconds  SLS with B UE  assist to promote glute med muscle strengthening             R 10x5 seconds for 3 sets             L 10x5 seconds for 3 sets   Stepping over 3 mini hurdles with one UE to no UE assist    1x each LE with one UE assist  1x each LE with one UE light touch assist   4x each LE with one UE to no UE assist   L lateral lean during L LE stance phase  Standing L hip abduction 2 lbs with B UE assist   3x. R knee discomfort.   Seated manually resisted clamshell isometrics, hips less than 90 degrees flexion 10x2 with 5 seconds to promote glute med muscle strength   Improved exercise technique, movement at target joints, use of target muscles after mod verbal, visual, tactile cues.      Response to treatment No complain of pain throughout session. Good muscle use felt with exercises.   Clinical Impression Continued working on glute med and max strengthening to decrease pelvic drop and lateral lean during gait to promote steadiness when ambulating. No complain of pain throughout session. Muscle use felt during exercises. Pt will benefit from continued skilled physical therapy services to improve strength, balance, function, and decrease fall risk.   PT Education - 10/29/19 0911    Education Details  ther-ex    Person(s) Educated  Patient    Methods  Explanation;Demonstration;Tactile cues;Verbal cues    Comprehension  Returned demonstration;Verbalized understanding       PT Short Term Goals - 10/22/19 1854      PT SHORT TERM GOAL #1   Title  Pt will be independent wiht her HEP to improve strength, balance, and  function.    Time  3    Period  Weeks    Status  New    Target Date  11/14/19        PT Long Term Goals - 10/22/19 1855      PT LONG TERM GOAL #1   Title  Patient will improve bilateral hip abduction and extension strength to improve steadiness during gait and decrease fall risk.    Time  8    Period  Weeks    Status  New    Target Date  12/19/19      PT LONG TERM GOAL #2   Title  Patient will improve her Berg Balance score to 48/56 as a demonstration of improved balance.    Baseline  42/56 (10/22/2019)    Time  8    Period  Weeks    Status  New    Target Date  12/19/19            Plan - 10/29/19 0916    Clinical Impression Statement  Continued working on glute med and max strengthening to decrease pelvic drop and lateral lean during gait to promote steadiness when ambulating. No complain of pain throughout session. Muscle use felt during exercises. Pt will benefit from continued skilled physical therapy services to improve strength, balance, function, and decrease fall risk.    Personal Factors and Comorbidities  Age;Comorbidity 3+;Past/Current Experience;Time since onset of injury/illness/exacerbation;Fitness    Comorbidities  HTN, OA, osteoporosis    Examination-Activity Limitations  Carry;Locomotion Level    Stability/Clinical Decision Making  Stable/Uncomplicated    Rehab Potential  Fair    PT Frequency  2x /  week    PT Duration  8 weeks    PT Treatment/Interventions  Neuromuscular re-education;Therapeutic exercise;Therapeutic activities;Functional mobility training;Balance training;Patient/family education;Manual techniques;Dry needling;Vestibular;Aquatic Therapy;Canalith Repostioning;Electrical Stimulation;Iontophoresis 4mg /ml Dexamethasone;Gait training;Stair training    PT Next Visit Plan  glute med, max, strunk strengthening, lumbopelvic control. Manual techniques, modalities PRN    Consulted and Agree with Plan of Care  Patient       Patient will benefit from  skilled therapeutic intervention in order to improve the following deficits and impairments:  Postural dysfunction, Improper body mechanics, Decreased strength, Decreased balance  Visit Diagnosis: Muscle weakness (generalized)  Unsteadiness on feet     Problem List Patient Active Problem List   Diagnosis Date Noted  . Leg weakness 10/13/2019  . Headache 10/10/2019  . Breast tenderness 10/10/2019  . Inversion of right nipple 10/10/2019  . Acid reflux 09/08/2019  . Breast cancer screening 06/08/2019  . Cracked lip 05/25/2019  . SOBOE (shortness of breath on exertion) 05/20/2019  . Nocturia 03/21/2019  . Personal history of kidney stones 03/21/2019  . Depression, major, single episode, complete remission ([place]) 07/18/2017  . Insomnia secondary to depression with anxiety 02/25/2017  . Adjustment reaction with anxiety and depression 02/26/2016  . Encounter for counseling 12/01/2015  . Microhematuria 03/10/2015  . OSA (obstructive sleep apnea) 01/13/2015  . Nephrolithiasis 12/30/2014  . Recurrent UTI 10/19/2014  . Hyperlipidemia 02/19/2013  . Essential (primary) hypertension 02/19/2013  . Diffuse cystic mastopathy of breast 02/19/2013  . Basal cell carcinoma of skin 02/19/2013  . Hypothyroidism 02/28/2012  . Cyst of ovary 02/19/2009  . Nontoxic multinodular goiter 03/06/2003  . Prediabetes 02/20/2003  . Osteoporosis 02/20/2003  . Osteoarthritis 02/20/2003  . Impaired glucose tolerance 02/20/2003  . Asymptomatic menopausal state 02/20/1984    Joneen Boers PT, DPT   10/29/2019, 9:17 AM  [facility] [address] [place], [address] Phone: [phone]   Fax:  [phone]  Name: Jennifer Bernard MRN: [id number] Date of Birth: 11/13/1926

## 2019-10-30 ENCOUNTER — Encounter: Payer: Self-pay | Admitting: Internal Medicine

## 2019-10-30 ENCOUNTER — Ambulatory Visit (INDEPENDENT_AMBULATORY_CARE_PROVIDER_SITE_OTHER): Payer: Medicare HMO | Admitting: Internal Medicine

## 2019-10-30 ENCOUNTER — Other Ambulatory Visit: Payer: Self-pay

## 2019-10-30 DIAGNOSIS — F325 Major depressive disorder, single episode, in full remission: Secondary | ICD-10-CM

## 2019-10-30 DIAGNOSIS — E039 Hypothyroidism, unspecified: Secondary | ICD-10-CM

## 2019-10-30 DIAGNOSIS — R519 Headache, unspecified: Secondary | ICD-10-CM

## 2019-10-30 DIAGNOSIS — I10 Essential (primary) hypertension: Secondary | ICD-10-CM | POA: Diagnosis not present

## 2019-10-30 DIAGNOSIS — E785 Hyperlipidemia, unspecified: Secondary | ICD-10-CM | POA: Diagnosis not present

## 2019-10-30 NOTE — Progress Notes (Signed)
Subjective:    Patient ID: Jennifer Bernard, female    DOB: Aug 01, 1927, 86 y.o.   MRN: [id number]  HPI  Patient here for a scheduled follow up.  She reports she has noticed not sleeping as well.  Gets up and urinates.  Discussed f/u with urology.  Not taking any medication now.  Also discussed taking lexapro and taking regularly.  Should help with anxiety and stress.  May help her sleep better.  Some increased discomfort with her right shoulder and neck.  No injury or trauma.  Reports having persistent intermittent headache.  Notices more right side.  Previous ESR wnl.  No vision change, but has been a persistent issue for her.  No dizziness.  Discussed further w/up including scan.  She is agreeable.  No chest pain or sob.  No acid reflux.  No abdominal pain.  Bowels moving.    Past Medical History:  Diagnosis Date  . Asymptomatic menopausal state 02/20/1984   Overview:  Naturally occuring and asymptomatic Vaginal atrophy present on topical estrogen Overview:  Overview:  Naturally occuring and asymptomatic Vaginal atrophy present on topical estrogen  . Basal cell carcinoma of skin 02/19/2013   Overview:  Basal cell on her nasolabial fold 2014 S/p Mohs by Dr. Lacinda Axon Followed by local dermatologist regularly Overview:  Overview:  Followed by Dr. Lacinda Axon  . Cyst of ovary 02/19/2009   Overview:  Ovarian cyst on right stable 2004-2010 Cyst ? larger on CT 2012 Stable on follow up US Followed by Dr. Edwinna Areola Overview:  Overview:  Ovarian cyst on right stable 2004-2010 Cyst ? larger on CT 2012 Stable on follow up US Followed by Dr. Edwinna Areola  . Depression, major, single episode, complete remission ([place]) 07/18/2017  . Diffuse cystic mastopathy of breast 02/19/2013   Overview:  Mother with breast cancer later in life Two breast biopsies both with normal pathology Annual mammograms recommended, last 2016 Followed at the [facility]:  Relevant Hx: Course: Daily Update:  Today's Plan: Overview:  Overview:  Mother with breast cancer later in life Two breast biopsies both with normal pathology Annual mammograms recommended, last 26  . Essential (primary) hypertension 02/19/2013   Overview:  Goal blood pressure < 135/85 On atenolol Overview:  Overview:  Goal blood pressure < 135/85 On atenolol  . Hyperlipidemia 02/19/2013   Overview:  Goal LDL < 130, last value 92 in 2017 On Pravachol Overview:  Overview:  Goal LDL < 130, last value 82 in 2012 On Pravachol  . Hypothyroidism 02/28/2012   Overview:  started on T4  TFT normal 2017 Followed by Dr. Almon Hercules Overview:  Overview:  started on T4  TFT normal 2014 Followed by Dr. Almon Hercules  . Impaired glucose tolerance 02/20/2003   Overview:  Overview:  Glucose goal < 100, last value 94 in 2014 HgbA1c goal < 6.0, last value 5.3 in 2014 Urine microalbumin negative in 2014 On diet and exercise  . Nontoxic multinodular goiter 03/06/2003   Overview:  Asymptomatic nodular thyroid Stable [place] 2004-2014 Followed by Dr. Almon Hercules Overview:  Overview:  Asymptomatic nodular thyroid Stable [place] 2004-2014 Followed by Dr. Almon Hercules  . OSA on CPAP 01/13/2015   Overview:  Sleep study sent to medical records:   Sleep efficiency 22%, latency 24 min, # awakenings 11, res desat N=10, index 17.8  Apnea 1 for 16 sec, hypopnea 16 for 18 sec, total sleep time only 90 min so index = 11 She did not sleep much  and the index puts her in mild category Repeat study a few days later when she slept 187 min her AHI = 4.6 which is normal, average O2 sat 94% I don't think   . Osteoarthritis 02/20/2003   Overview:  Mild bilateral hands Right CMC Right hip Left knee s/p meniscus tear All symptomatically stable Overview:  Overview:  Mild bilateral hands Right CMC Right hip Left knee s/p meniscus tear All symptomatically stable  . Osteoporosis 02/20/2003   Overview:  Bone density 2004 LS -3.32 and RH-1.62 Bone density 2008 LS -3.14 and RH -1.22 Bone density 2009 LS -3.17 and RH -1.21  Bone density 2010 LS -3.14 and RH -1.42  Bone density 2014 LS -2.7 (L3 -3.1) and RH -1.5 Bone density 2017 LS -2.3 and RH -1.4 (neck -1.7) Vitamin D level 38 in 2012 On Boniva 2004 to 2010, restarted 2015 Repeat bone density in 2020 and stop Boniva  Last Assessment & Pl  . Prediabetes 02/20/2003   Overview:  Glucose goal < 100, last value 91 in 2017 HgbA1c goal < 6.0, last value 5.6 in 2017 Urine microalbumin negative in 2017 On diet and exercise  . Recurrent UTI 10/19/2014   Past Surgical History:  Procedure Laterality Date  . ABDOMINAL HYSTERECTOMY  1975  . URETEROSCOPY    . URETEROSCOPY WITH HOLMIUM LASER LITHOTRIPSY     Family History  Problem Relation Age of Onset  . Breast cancer Mother   . Skin cancer Father    Social History   Socioeconomic History  . Marital status: Widowed    Spouse name: Not on file  . Number of children: Not on file  . Years of education: Not on file  . Highest education level: Not on file  Occupational History  . Not on file  Tobacco Use  . Smoking status: Never Smoker  . Smokeless tobacco: Never Used  Substance and Sexual Activity  . Alcohol use: No  . Drug use: No  . Sexual activity: Not Currently    Birth control/protection: Post-menopausal  Other Topics Concern  . Not on file  Social History Narrative  . Not on file   Social Determinants of Health   Financial Resource Strain:   . Difficulty of Paying Living Expenses:   Food Insecurity:   . Worried About Charity fundraiser in the Last Year:   . Arboriculturist in the Last Year:   Transportation Needs:   . Film/video editor (Medical):   Marland Kitchen Lack of Transportation (Non-Medical):   Physical Activity:   . Days of Exercise per Week:   . Minutes of Exercise per Session:   Stress:   . Feeling of Stress :   Social Connections:   . Frequency of Communication with Friends and Family:   . Frequency of Social Gatherings with Friends and Family:   . Attends Religious Services:   . Active  Member of Clubs or Organizations:   . Attends Archivist Meetings:   Marland Kitchen Marital Status:     Outpatient Encounter Medications as of 10/30/2019  Medication Sig  . escitalopram (LEXAPRO) 10 MG tablet Take 1 tablet (10 mg total) by mouth daily.  Marland Kitchen levothyroxine (SYNTHROID, LEVOTHROID) 75 MCG tablet   . mometasone (ELOCON) 0.1 % cream   . Multiple Vitamin (MULTIVITAMIN) capsule Take by mouth.  . pravastatin (PRAVACHOL) 40 MG tablet TAKE ONE TABLET BY MOUTH EVERY EVENING  . trospium (SANCTURA) 20 MG tablet Take 1 tablet (20 mg total) by mouth  at bedtime. (Patient not taking: Reported on 10/22/2019)   No facility-administered encounter medications on file as of 10/30/2019.    Review of Systems  Constitutional: Negative for appetite change and unexpected weight change.  HENT: Negative for congestion and sinus pressure.   Respiratory: Negative for chest tightness and shortness of breath.        Occasional cough.    Cardiovascular: Negative for chest pain, palpitations and leg swelling.  Gastrointestinal: Negative for abdominal pain, diarrhea, nausea and vomiting.  Genitourinary: Negative for difficulty urinating and dysuria.  Musculoskeletal: Negative for joint swelling and myalgias.       Right shoulder and neck pain as outlined.    Skin: Negative for color change and rash.  Neurological: Positive for headaches. Negative for dizziness.  Psychiatric/Behavioral: Negative for agitation and dysphoric mood.       Objective:    Physical Exam Constitutional:      General: She is not in acute distress.    Appearance: Normal appearance.  HENT:     Head: Normocephalic and atraumatic.     Right Ear: External ear normal.     Left Ear: External ear normal.  Eyes:     General: No scleral icterus.       Right eye: No discharge.        Left eye: No discharge.     Conjunctiva/sclera: Conjunctivae normal.  Neck:     Thyroid: No thyromegaly.  Cardiovascular:     Rate and Rhythm: Normal  rate and regular rhythm.  Pulmonary:     Effort: No respiratory distress.     Breath sounds: Normal breath sounds. No wheezing.  Abdominal:     General: Bowel sounds are normal.     Palpations: Abdomen is soft.     Tenderness: There is no abdominal tenderness.  Musculoskeletal:        General: No swelling or tenderness.     Cervical back: Neck supple. No tenderness.  Lymphadenopathy:     Cervical: No cervical adenopathy.  Skin:    Findings: No erythema or rash.  Neurological:     Mental Status: She is alert.  Psychiatric:        Mood and Affect: Mood normal.        Behavior: Behavior normal.     BP 122/74   Pulse 80   Temp (!) 96.9 F (36.1 C)   Resp 16   Ht 5' 2" (1.575 m)   Wt 143 lb (64.9 kg)   SpO2 97%   BMI 26.16 kg/m  Wt Readings from Last 3 Encounters:  10/30/19 143 lb (64.9 kg)  10/21/19 145 lb (65.8 kg)  10/10/19 144 lb (65.3 kg)     Lab Results  Component Value Date   WBC 7.3 10/10/2019   HGB 15.2 (H) 10/10/2019   HCT 45.8 10/10/2019   PLT 232.0 10/10/2019   GLUCOSE 94 10/10/2019   CHOL 161 10/10/2019   TRIG 170.0 (H) 10/10/2019   HDL 60.70 10/10/2019   LDLCALC 66 10/10/2019   ALT 13 10/10/2019   AST 20 10/10/2019   NA 140 10/10/2019   K 4.1 10/10/2019   CL 105 10/10/2019   CREATININE 0.88 10/10/2019   BUN 13 10/10/2019   CO2 27 10/10/2019   TSH 1.83 10/10/2019   HGBA1C 5.5 10/10/2019    US BREAST LTD UNI RIGHT INC AXILLA  Result Date: 10/28/2019 CLINICAL DATA:  86 year old female presenting for evaluation of right nipple inversion and right retroareolar pain. The patient states  she has noticed no difference in the appearance of her nipple as far as retraction, however she did notice some bumps on the areola after she inspected her nipple due to the tenderness. EXAM: DIGITAL DIAGNOSTIC BILATERAL MAMMOGRAM WITH CAD AND TOMO ULTRASOUND RIGHT BREAST COMPARISON:  Previous exam(s). ACR Breast Density Category b: There are scattered areas of  fibroglandular density. FINDINGS: No suspicious calcifications, masses or areas of distortion are seen in the bilateral breasts. Mammographic images were processed with CAD. On physical exam, no significant nipple retraction is noted. The patient pointed out the bumps that she had noticed on her areola, which correspond with normal appearing Montgomery glands. No palpable masses are identified. Targeted ultrasound is performed, showing normal fibroglandular tissue in the retroareolar right breast. No suspicious masses or areas of shadowing are identified. IMPRESSION: 1. No suspicious mammographic or targeted sonographic abnormalities are identified in the retroareolar right breast. 2.  No evidence of malignancy in the bilateral breasts. RECOMMENDATION: 1.  Screening mammogram in one year.(Code:SM-B-01Y) I have discussed the findings and recommendations with the patient. If applicable, a reminder letter will be sent to the patient regarding the next appointment. BI-RADS CATEGORY  1: Negative. Electronically Signed   By: Ammie Ferrier M.D.   On: 10/28/2019 11:35   MM DIAG BREAST TOMO BILATERAL  Result Date: 10/28/2019 CLINICAL DATA:  86 year old female presenting for evaluation of right nipple inversion and right retroareolar pain. The patient states she has noticed no difference in the appearance of her nipple as far as retraction, however she did notice some bumps on the areola after she inspected her nipple due to the tenderness. EXAM: DIGITAL DIAGNOSTIC BILATERAL MAMMOGRAM WITH CAD AND TOMO ULTRASOUND RIGHT BREAST COMPARISON:  Previous exam(s). ACR Breast Density Category b: There are scattered areas of fibroglandular density. FINDINGS: No suspicious calcifications, masses or areas of distortion are seen in the bilateral breasts. Mammographic images were processed with CAD. On physical exam, no significant nipple retraction is noted. The patient pointed out the bumps that she had noticed on her areola,  which correspond with normal appearing Montgomery glands. No palpable masses are identified. Targeted ultrasound is performed, showing normal fibroglandular tissue in the retroareolar right breast. No suspicious masses or areas of shadowing are identified. IMPRESSION: 1. No suspicious mammographic or targeted sonographic abnormalities are identified in the retroareolar right breast. 2.  No evidence of malignancy in the bilateral breasts. RECOMMENDATION: 1.  Screening mammogram in one year.(Code:SM-B-01Y) I have discussed the findings and recommendations with the patient. If applicable, a reminder letter will be sent to the patient regarding the next appointment. BI-RADS CATEGORY  1: Negative. Electronically Signed   By: Ammie Ferrier M.D.   On: 10/28/2019 11:35       Assessment & Plan:   Problem List Items Addressed This Visit    Depression, major, single episode, complete remission ([place])    Discussed taking lexapro and taking regularly.  Follow.  Overall relatively stable.        Essential (primary) hypertension    On no medication.  Spot check pressures.        Relevant Orders   Basic metabolic panel   Headache    Persistent headache as outlined.  ESR wnl.  Given persistent and change for her, will check MRI to confirm no structural abnormality.        Relevant Orders   MR Brain W Wo Contrast   Hyperlipidemia    On pravastatin.  Low cholesterol diet and exercise.  Follow  lipid panel and liver function tests.        Relevant Orders   Hepatic function panel   Lipid panel   Hypothyroidism    On thyroid replacement.  Follow tsh.           Einar Pheasant, MD

## 2019-10-31 ENCOUNTER — Ambulatory Visit: Payer: Medicare HMO

## 2019-11-03 ENCOUNTER — Encounter: Payer: Self-pay | Admitting: Internal Medicine

## 2019-11-03 NOTE — Assessment & Plan Note (Signed)
Persistent headache as outlined.  ESR wnl.  Given persistent and change for her, will check MRI to confirm no structural abnormality.

## 2019-11-03 NOTE — Assessment & Plan Note (Signed)
On thyroid replacement.  Follow tsh.  

## 2019-11-03 NOTE — Assessment & Plan Note (Signed)
On no medication.  Spot check pressures.

## 2019-11-03 NOTE — Assessment & Plan Note (Signed)
Discussed taking lexapro and taking regularly.  Follow.  Overall relatively stable.

## 2019-11-03 NOTE — Assessment & Plan Note (Signed)
On pravastatin.  Low cholesterol diet and exercise.  Follow lipid panel and liver function tests.   

## 2019-11-05 ENCOUNTER — Other Ambulatory Visit: Payer: Self-pay

## 2019-11-05 ENCOUNTER — Ambulatory Visit: Payer: Medicare HMO

## 2019-11-05 DIAGNOSIS — R2681 Unsteadiness on feet: Secondary | ICD-10-CM | POA: Diagnosis not present

## 2019-11-05 DIAGNOSIS — M6281 Muscle weakness (generalized): Secondary | ICD-10-CM

## 2019-11-05 NOTE — Therapy (Signed)
[facility] [address], [place], [address] Phone: [phone]   Fax:  [phone]  Physical Therapy Treatment  Patient Details  Name: Jennifer Bernard MRN: [id number] Date of Birth: [birth year] Referring Provider (PT): Einar Pheasant, MD   Encounter Date: 11/05/2019  PT End of Session - 11/05/19 1454    Visit Number  4    Number of Visits  17    Date for PT Re-Evaluation  12/19/19    Authorization Type  4    Authorization Time Period  of 10 progress report    PT Start Time  1455   pt arrived late   PT Stop Time  1534    PT Time Calculation (min)  39 min    Equipment Utilized During Treatment  Gait belt    Activity Tolerance  Patient tolerated treatment well    Behavior During Therapy  WFL for tasks assessed/performed       Past Medical History:  Diagnosis Date  . Asymptomatic menopausal state 02/20/1984   Overview:  Naturally occuring and asymptomatic Vaginal atrophy present on topical estrogen Overview:  Overview:  Naturally occuring and asymptomatic Vaginal atrophy present on topical estrogen  . Basal cell carcinoma of skin 02/19/2013   Overview:  Basal cell on her nasolabial fold 2014 S/p Mohs by Dr. Lacinda Axon Followed by local dermatologist regularly Overview:  Overview:  Followed by Dr. Lacinda Axon  . Cyst of ovary 02/19/2009   Overview:  Ovarian cyst on right stable 2004-2010 Cyst ? larger on CT 2012 Stable on follow up US Followed by Dr. Edwinna Areola Overview:  Overview:  Ovarian cyst on right stable 2004-2010 Cyst ? larger on CT 2012 Stable on follow up US Followed by Dr. Edwinna Areola  . Depression, major, single episode, complete remission ([place]) 07/18/2017  . Diffuse cystic mastopathy of breast 02/19/2013   Overview:  Mother with breast cancer later in life Two breast biopsies both with normal pathology Annual mammograms recommended, last 2016 Followed at the [facility]:   Relevant Hx: Course: Daily Update: Today's Plan: Overview:  Overview:  Mother with breast cancer later in life Two breast biopsies both with normal pathology Annual mammograms recommended, last 69  . Essential (primary) hypertension 02/19/2013   Overview:  Goal blood pressure < 135/85 On atenolol Overview:  Overview:  Goal blood pressure < 135/85 On atenolol  . Hyperlipidemia 02/19/2013   Overview:  Goal LDL < 130, last value 92 in 2017 On Pravachol Overview:  Overview:  Goal LDL < 130, last value 82 in 2012 On Pravachol  . Hypothyroidism 02/28/2012   Overview:  started on T4  TFT normal 2017 Followed by Dr. Almon Hercules Overview:  Overview:  started on T4  TFT normal 2014 Followed by Dr. Almon Hercules  . Impaired glucose tolerance 02/20/2003   Overview:  Overview:  Glucose goal < 100, last value 94 in 2014 HgbA1c goal < 6.0, last value 5.3 in 2014 Urine microalbumin negative in 2014 On diet and exercise  . Nontoxic multinodular goiter 03/06/2003   Overview:  Asymptomatic nodular thyroid Stable [place] 2004-2014 Followed by Dr. Almon Hercules Overview:  Overview:  Asymptomatic nodular thyroid Stable [place] 2004-2014 Followed by Dr. Almon Hercules  . OSA on CPAP 01/13/2015   Overview:  Sleep study sent to medical records:   Sleep efficiency 22%, latency 24 min, # awakenings 11, res desat N=10, index 17.8  Apnea 1 for 16 sec, hypopnea 16 for 18  sec, total sleep time only 90 min so index = 11 She did not sleep much and the index puts her in mild category Repeat study a few days later when she slept 187 min her AHI = 4.6 which is normal, average O2 sat 94% I don't think   . Osteoarthritis 02/20/2003   Overview:  Mild bilateral hands Right CMC Right hip Left knee s/p meniscus tear All symptomatically stable Overview:  Overview:  Mild bilateral hands Right CMC Right hip Left knee s/p meniscus tear All symptomatically stable  . Osteoporosis 02/20/2003   Overview:  Bone density 2004 LS -3.32 and RH-1.62 Bone density 2008 LS -3.14 and RH -1.22 Bone  density 2009 LS -3.17 and RH -1.21 Bone density 2010 LS -3.14 and RH -1.42  Bone density 2014 LS -2.7 (L3 -3.1) and RH -1.5 Bone density 2017 LS -2.3 and RH -1.4 (neck -1.7) Vitamin D level 38 in 2012 On Boniva 2004 to 2010, restarted 2015 Repeat bone density in 2020 and stop Boniva  Last Assessment & Pl  . Prediabetes 02/20/2003   Overview:  Glucose goal < 100, last value 91 in 2017 HgbA1c goal < 6.0, last value 5.6 in 2017 Urine microalbumin negative in 2017 On diet and exercise  . Recurrent UTI 10/19/2014    Past Surgical History:  Procedure Laterality Date  . ABDOMINAL HYSTERECTOMY  1975  . URETEROSCOPY    . URETEROSCOPY WITH HOLMIUM LASER LITHOTRIPSY      There were no vitals filed for this visit.  Subjective Assessment - 11/05/19 1502    Subjective  A little R hip soreness. No pain or discomfort.    Pertinent History  LE weakness, unsteadiness. Symptoms began in the last few months. Gradual onset. Legs feeling shaky and wobbly, especially in the thighs. UE do not feel wobbly. Has not had the problem before. No pain or discomfort. No back pain. No falls in the last 6 months (has fear of falling). Pt currently lives in the [facility] at [place]. Does not use an AD. Has not yet had PT before. Exercises about 35 min a day (stationary bike, elliptical, and the Nustep)    Patient Stated Goals  Improve strength and balance.                               PT Education - 11/05/19 1520    Education Details  ther-ex    Person(s) Educated  Patient    Methods  Explanation;Demonstration;Tactile cues;Verbal cues    Comprehension  Returned demonstration;Verbalized understanding      Objective  No latex band allergies  Blood pressure is controlledper pt    L Pelvic drop during R LE stance phase with unsteadiness dizziness when turning to the R in supine   Usually feels more off balance early in the morning when getting out of bed. Feels a little"off kilter".  Gets out at the R side of the bed.  (dizziness with supine R rotation.  [name] (-) bilaterally  MedbridgeAccess Code: [id number]  Therapeutic exercise   Standing hip abduction with B UE assist  R 10x, then 10x10 seconds  L 10x, then 10x10 seconds  Standing with one foot in front, with PT manual perturbation   R foot in front 1 min x3   L foot in front 1 min x 3  Stepping over 4 mini hurdles with CGA to min A at times 6x  Improved ability to decrease lateral  lean compensation with increased repetition  Standing L trunk side bend 10 seconds x 10  Decreased R lateral hip/back soreness  Seated physioball rolls to decrease low back extension pressure  Flexion 10x5 seconds   Then to the L 10x5 seconds     No R low back/hip discomfort afterwards  Possible improved ability to ambulate with more steadiness observed afterwards.    Side stepping 5 ft to the R and 5 ft to the L 5x to promote glute med muscle strengthening   Improved exercise technique, movement at target joints, use of target muscles after mod verbal, visual, tactile cues.       Response to treatment Good muscle use felt with exercises.   Clinical Impression Possible low back involvement with posterior lateral hip and thigh discomfort which decreased with treatment to decrease pressure to R low back. Possible improved ability to ambulate with improved steadiness afterwards observed.  Continued working on improving glute med muscle strength to decrease lateral lean during gait to promote balance. Some difficulty and unsteadiness when stepping over mini hurdles at first but balance improved with increased practice. Pt will benefit from continued skilled physical therapy services to improve strength, balance, function, and decrease fall risk.     PT Short Term Goals - 10/22/19 1854      PT SHORT TERM GOAL #1   Title  Pt will be independent wiht her HEP to improve  strength, balance, and function.    Time  3    Period  Weeks    Status  New    Target Date  11/14/19        PT Long Term Goals - 10/22/19 1855      PT LONG TERM GOAL #1   Title  Patient will improve bilateral hip abduction and extension strength to improve steadiness during gait and decrease fall risk.    Time  8    Period  Weeks    Status  New    Target Date  12/19/19      PT LONG TERM GOAL #2   Title  Patient will improve her Berg Balance score to 48/56 as a demonstration of improved balance.    Baseline  42/56 (10/22/2019)    Time  8    Period  Weeks    Status  New    Target Date  12/19/19            Plan - 11/05/19 1649    Clinical Impression Statement  Possible low back involvement with posterior lateral hip and thigh discomfort which decreased with treatment to decrease pressure to R low back. Possible improved ability to ambulate with improved steadiness afterwards observed.  Continued working on improving glute med muscle strength to decrease lateral lean during gait to promote balance. Some difficulty and unsteadiness when stepping over mini hurdles at first but balance improved with increased practice. Pt will benefit from continued skilled physical therapy services to improve strength, balance, function, and decrease fall risk.    Personal Factors and Comorbidities  Age;Comorbidity 3+;Past/Current Experience;Time since onset of injury/illness/exacerbation;Fitness    Comorbidities  HTN, OA, osteoporosis    Examination-Activity Limitations  Carry;Locomotion Level    Stability/Clinical Decision Making  Stable/Uncomplicated    Rehab Potential  Fair    PT Frequency  2x / week    PT Duration  8 weeks    PT Treatment/Interventions  Neuromuscular re-education;Therapeutic exercise;Therapeutic activities;Functional mobility training;Balance training;Patient/family education;Manual techniques;Dry needling;Vestibular;Aquatic Therapy;Canalith Repostioning;Electrical  Stimulation;Iontophoresis 4mg /ml Dexamethasone;Gait training;Stair training  PT Next Visit Plan  glute med, max, strunk strengthening, lumbopelvic control. Manual techniques, modalities PRN    Consulted and Agree with Plan of Care  Patient       Patient will benefit from skilled therapeutic intervention in order to improve the following deficits and impairments:  Postural dysfunction, Improper body mechanics, Decreased strength, Decreased balance  Visit Diagnosis: Muscle weakness (generalized)  Unsteadiness on feet     Problem List Patient Active Problem List   Diagnosis Date Noted  . Leg weakness 10/13/2019  . Headache 10/10/2019  . Breast tenderness 10/10/2019  . Inversion of right nipple 10/10/2019  . Acid reflux 09/08/2019  . Breast cancer screening 06/08/2019  . Cracked lip 05/25/2019  . SOBOE (shortness of breath on exertion) 05/20/2019  . Nocturia 03/21/2019  . Personal history of kidney stones 03/21/2019  . Depression, major, single episode, complete remission ([place]) 07/18/2017  . Insomnia secondary to depression with anxiety 02/25/2017  . Adjustment reaction with anxiety and depression 02/26/2016  . Encounter for counseling 12/01/2015  . Microhematuria 03/10/2015  . OSA (obstructive sleep apnea) 01/13/2015  . Nephrolithiasis 12/30/2014  . Recurrent UTI 10/19/2014  . Hyperlipidemia 02/19/2013  . Essential (primary) hypertension 02/19/2013  . Diffuse cystic mastopathy of breast 02/19/2013  . Basal cell carcinoma of skin 02/19/2013  . Hypothyroidism 02/28/2012  . Cyst of ovary 02/19/2009  . Nontoxic multinodular goiter 03/06/2003  . Prediabetes 02/20/2003  . Osteoporosis 02/20/2003  . Osteoarthritis 02/20/2003  . Impaired glucose tolerance 02/20/2003  . Asymptomatic menopausal state 02/20/1984    Joneen Boers PT, DPT   11/05/2019, 4:59 PM  [facility] [address] [place],  [address] Phone: [phone]   Fax:  [phone]  Name: Jennifer Bernard MRN: [id number] Date of Birth: April 15, 1927

## 2019-11-06 ENCOUNTER — Other Ambulatory Visit: Payer: Self-pay | Admitting: Internal Medicine

## 2019-11-06 DIAGNOSIS — R519 Headache, unspecified: Secondary | ICD-10-CM

## 2019-11-06 NOTE — Progress Notes (Signed)
Order placed for f/u lab.   

## 2019-11-07 ENCOUNTER — Ambulatory Visit: Payer: Medicare HMO

## 2019-11-07 ENCOUNTER — Other Ambulatory Visit: Payer: Self-pay

## 2019-11-07 DIAGNOSIS — R2681 Unsteadiness on feet: Secondary | ICD-10-CM

## 2019-11-07 DIAGNOSIS — M6281 Muscle weakness (generalized): Secondary | ICD-10-CM

## 2019-11-07 NOTE — Therapy (Signed)
[facility] [address], [place], [address] Phone: [phone]   Fax:  [phone]  Physical Therapy Treatment  Patient Details  Name: Jennifer Bernard MRN: [id number] Date of Birth: Jan 02, 1927 Referring Provider (PT): Einar Pheasant, MD   Encounter Date: 11/07/2019  PT End of Session - 11/07/19 1649    Visit Number  5    Number of Visits  17    Date for PT Re-Evaluation  12/19/19    Authorization Type  5    Authorization Time Period  of 10 progress report    PT Start Time  [id number]    PT Stop Time  1730    PT Time Calculation (min)  40 min    Equipment Utilized During Treatment  Gait belt    Activity Tolerance  Patient tolerated treatment well    Behavior During Therapy  [facility] for tasks assessed/performed       Past Medical History:  Diagnosis Date  . Asymptomatic menopausal state 02/20/1984   Overview:  Naturally occuring and asymptomatic Vaginal atrophy present on topical estrogen Overview:  Overview:  Naturally occuring and asymptomatic Vaginal atrophy present on topical estrogen  . Basal cell carcinoma of skin 02/19/2013   Overview:  Basal cell on her nasolabial fold 2014 S/p Mohs by Dr. Lacinda Axon Followed by local dermatologist regularly Overview:  Overview:  Followed by Dr. Lacinda Axon  . Cyst of ovary 02/19/2009   Overview:  Ovarian cyst on right stable 2004-2010 Cyst ? larger on CT 2012 Stable on follow up US Followed by Dr. Edwinna Areola Overview:  Overview:  Ovarian cyst on right stable 2004-2010 Cyst ? larger on CT 2012 Stable on follow up US Followed by Dr. Edwinna Areola  . Depression, major, single episode, complete remission ([place]) 07/18/2017  . Diffuse cystic mastopathy of breast 02/19/2013   Overview:  Mother with breast cancer later in life Two breast biopsies both with normal pathology Annual mammograms recommended, last 2016 Followed at the [facility]:  Relevant Hx: Course:  Daily Update: Today's Plan: Overview:  Overview:  Mother with breast cancer later in life Two breast biopsies both with normal pathology Annual mammograms recommended, last 67  . Essential (primary) hypertension 02/19/2013   Overview:  Goal blood pressure < 135/85 On atenolol Overview:  Overview:  Goal blood pressure < 135/85 On atenolol  . Hyperlipidemia 02/19/2013   Overview:  Goal LDL < 130, last value 92 in 2017 On Pravachol Overview:  Overview:  Goal LDL < 130, last value 82 in 2012 On Pravachol  . Hypothyroidism 02/28/2012   Overview:  started on T4  TFT normal 2017 Followed by Dr. Almon Hercules Overview:  Overview:  started on T4  TFT normal 2014 Followed by Dr. Almon Hercules  . Impaired glucose tolerance 02/20/2003   Overview:  Overview:  Glucose goal < 100, last value 94 in 2014 HgbA1c goal < 6.0, last value 5.3 in 2014 Urine microalbumin negative in 2014 On diet and exercise  . Nontoxic multinodular goiter 03/06/2003   Overview:  Asymptomatic nodular thyroid Stable [place] 2004-2014 Followed by Dr. Almon Hercules Overview:  Overview:  Asymptomatic nodular thyroid Stable [place] 2004-2014 Followed by Dr. Almon Hercules  . OSA on CPAP 01/13/2015   Overview:  Sleep study sent to medical records:   Sleep efficiency 22%, latency 24 min, # awakenings 11, res desat N=10, index 17.8  Apnea 1 for 16 sec, hypopnea 16 for 18 sec, total sleep time  only 90 min so index = 11 She did not sleep much and the index puts her in mild category Repeat study a few days later when she slept 187 min her AHI = 4.6 which is normal, average O2 sat 94% I don't think   . Osteoarthritis 02/20/2003   Overview:  Mild bilateral hands Right CMC Right hip Left knee s/p meniscus tear All symptomatically stable Overview:  Overview:  Mild bilateral hands Right CMC Right hip Left knee s/p meniscus tear All symptomatically stable  . Osteoporosis 02/20/2003   Overview:  Bone density 2004 LS -3.32 and RH-1.62 Bone density 2008 LS -3.14 and RH -1.22 Bone density 2009 LS -3.17  and RH -1.21 Bone density 2010 LS -3.14 and RH -1.42  Bone density 2014 LS -2.7 (L3 -3.1) and RH -1.5 Bone density 2017 LS -2.3 and RH -1.4 (neck -1.7) Vitamin D level 38 in 2012 On Boniva 2004 to 2010, restarted 2015 Repeat bone density in 2020 and stop Boniva  Last Assessment & Pl  . Prediabetes 02/20/2003   Overview:  Glucose goal < 100, last value 91 in 2017 HgbA1c goal < 6.0, last value 5.6 in 2017 Urine microalbumin negative in 2017 On diet and exercise  . Recurrent UTI 10/19/2014    Past Surgical History:  Procedure Laterality Date  . ABDOMINAL HYSTERECTOMY  1975  . URETEROSCOPY    . URETEROSCOPY WITH HOLMIUM LASER LITHOTRIPSY      There were no vitals filed for this visit.  Subjective Assessment - 11/07/19 1651    Subjective  R hip is alright. No soreness currently.    Pertinent History  LE weakness, unsteadiness. Symptoms began in the last few months. Gradual onset. Legs feeling shaky and wobbly, especially in the thighs. UE do not feel wobbly. Has not had the problem before. No pain or discomfort. No back pain. No falls in the last 6 months (has fear of falling). Pt currently lives in the [place] at [place]. Does not use an AD. Has not yet had PT before. Exercises about 35 min a day (stationary bike, elliptical, and the Nustep)    Patient Stated Goals  Improve strength and balance.    Currently in Pain?  No/denies                               PT Education - 11/07/19 1726    Education Details  ther-ex. HEP    Person(s) Educated  Patient    Methods  Explanation;Demonstration;Tactile cues;Verbal cues;Handout    Comprehension  Verbalized understanding;Returned demonstration          Objective  No latex band allergies  Blood pressure is controlledper pt    L Pelvic drop during R LE stance phase with unsteadiness dizziness when turning to the R in supine   Usually feels more off balance early in the morning when getting out of bed. Feels  a little"off kilter". Gets out at the R side of the bed.  (dizziness with supine R rotation.  [name] (-) bilaterally  MedbridgeAccess Code: [email or web address]  Therapeutic exercise   Seated physioball rolls to decrease low back extension pressure             Flexion 10x5 seconds              Then to the L 10x5 seconds                Stepping over 4 mini  hurdles with CGA 6x2 (unsteady towards end of 2nd set),     More steady today compared to previous session.               then 4 more times   Sitting trunk flexion stretch 5 seconds 2, then 10x5 seconds    Then to the L 5 sceonds 2x, then 10x5 seconds   Sated clamshell, hips less than 90 degrees flexion yellow band to promote glute med muscle strengthening  10x3. Easy. Upgrade to red band next visit   Side stepping with yellow band around thighs 5 ft to the R and 5 ft to the L 10x to promote glute med muscle strengthening  Seated manually resisted hip extension to promote rear end muscle strengthening.   R 10x2  L 10x2  difficult    Improved exercise technique, movement at target joints, use of target muscles after mod verbal, visual, tactile cues.       Response to treatment Good muscle use felt with exercises.   Clinical Impression   More steadiness observed to stepping over mini hurdles today. Continued working on decreasing low back extension pressure to continue to prevent R lateral hip pain as well as continued working on glute med and max strengthening to decrease lateral lean during gait. Pt tolerated session well without aggravation of symptoms. Pt will benefit from continued skilled physical therapy services to improve strength, balance, and function.    PT Short Term Goals - 10/22/19 1854      PT SHORT TERM GOAL #1   Title  Pt will be independent wiht her HEP to improve strength, balance, and function.    Time  3    Period  Weeks    Status  New    Target Date  11/14/19         PT Long Term Goals - 10/22/19 1855      PT LONG TERM GOAL #1   Title  Patient will improve bilateral hip abduction and extension strength to improve steadiness during gait and decrease fall risk.    Time  8    Period  Weeks    Status  New    Target Date  12/19/19      PT LONG TERM GOAL #2   Title  Patient will improve her Berg Balance score to 48/56 as a demonstration of improved balance.    Baseline  42/56 (10/22/2019)    Time  8    Period  Weeks    Status  New    Target Date  12/19/19            Plan - 11/07/19 1824    Clinical Impression Statement  More steadiness observed to stepping over mini hurdles today. Continued working on decreasing low back extension pressure to continue to prevent R lateral hip pain as well as continued working on glute med and max strengthening to decrease lateral lean during gait. Pt tolerated session well without aggravation of symptoms. Pt will benefit from continued skilled physical therapy services to improve strength, balance, and function.    Personal Factors and Comorbidities  Age;Comorbidity 3+;Past/Current Experience;Time since onset of injury/illness/exacerbation;Fitness    Comorbidities  HTN, OA, osteoporosis    Examination-Activity Limitations  Carry;Locomotion Level    Stability/Clinical Decision Making  Stable/Uncomplicated    Rehab Potential  Fair    PT Frequency  2x / week    PT Duration  8 weeks    PT Treatment/Interventions  Neuromuscular re-education;Therapeutic exercise;Therapeutic activities;Functional  mobility training;Balance training;Patient/family education;Manual techniques;Dry needling;Vestibular;Aquatic Therapy;Canalith Repostioning;Electrical Stimulation;Iontophoresis 4mg /ml Dexamethasone;Gait training;Stair training    PT Next Visit Plan  glute med, max, strunk strengthening, lumbopelvic control. Manual techniques, modalities PRN    Consulted and Agree with Plan of Care  Patient       Patient will benefit from  skilled therapeutic intervention in order to improve the following deficits and impairments:  Postural dysfunction, Improper body mechanics, Decreased strength, Decreased balance  Visit Diagnosis: Muscle weakness (generalized)  Unsteadiness on feet     Problem List Patient Active Problem List   Diagnosis Date Noted  . Leg weakness 10/13/2019  . Headache 10/10/2019  . Breast tenderness 10/10/2019  . Inversion of right nipple 10/10/2019  . Acid reflux 09/08/2019  . Breast cancer screening 06/08/2019  . Cracked lip 05/25/2019  . SOBOE (shortness of breath on exertion) 05/20/2019  . Nocturia 03/21/2019  . Personal history of kidney stones 03/21/2019  . Depression, major, single episode, complete remission ([place]) 07/18/2017  . Insomnia secondary to depression with anxiety 02/25/2017  . Adjustment reaction with anxiety and depression 02/26/2016  . Encounter for counseling 12/01/2015  . Microhematuria 03/10/2015  . OSA (obstructive sleep apnea) 01/13/2015  . Nephrolithiasis 12/30/2014  . Recurrent UTI 10/19/2014  . Hyperlipidemia 02/19/2013  . Essential (primary) hypertension 02/19/2013  . Diffuse cystic mastopathy of breast 02/19/2013  . Basal cell carcinoma of skin 02/19/2013  . Hypothyroidism 02/28/2012  . Cyst of ovary 02/19/2009  . Nontoxic multinodular goiter 03/06/2003  . Prediabetes 02/20/2003  . Osteoporosis 02/20/2003  . Osteoarthritis 02/20/2003  . Impaired glucose tolerance 02/20/2003  . Asymptomatic menopausal state 02/20/1984    Joneen Boers PT, DPT   11/07/2019, 6:29 PM  [facility] [address] [place], [address] Phone: [phone]   Fax:  [phone]  Name: Jennifer Bernard MRN: [id number] Date of Birth: Sep 29, 1926

## 2019-11-07 NOTE — Patient Instructions (Addendum)
Access Code: [email or web address] URL: [email or web address] Date: 11/07/2019 Prepared by: Joneen Boers  Exercises Standing Hip Abduction with Counter Support - 1 x daily - 7 x weekly - 10 reps - 3 sets Seated Flexion Stretch - 1 x daily - 7 x weekly - 1 sets - 10 reps - 5 seconds hold

## 2019-11-12 ENCOUNTER — Ambulatory Visit: Payer: Medicare HMO

## 2019-11-12 ENCOUNTER — Other Ambulatory Visit: Payer: Self-pay

## 2019-11-12 DIAGNOSIS — M6281 Muscle weakness (generalized): Secondary | ICD-10-CM

## 2019-11-12 DIAGNOSIS — R2681 Unsteadiness on feet: Secondary | ICD-10-CM | POA: Diagnosis not present

## 2019-11-12 NOTE — Therapy (Signed)
[facility] [address], [place], [address] Phone: [phone]   Fax:  [phone]  Physical Therapy Treatment  Patient Details  Name: Jennifer Bernard MRN: [id number] Date of Birth: [birth year] Referring Provider (PT): Einar Pheasant, MD   Encounter Date: 11/12/2019  PT End of Session - 11/12/19 1351    Visit Number  6    Number of Visits  17    Date for PT Re-Evaluation  12/19/19    Authorization Type  6    Authorization Time Period  of 10 progress report    PT Start Time  1351    PT Stop Time  1432    PT Time Calculation (min)  41 min    Equipment Utilized During Treatment  Gait belt    Activity Tolerance  Patient tolerated treatment well    Behavior During Therapy  WFL for tasks assessed/performed       Past Medical History:  Diagnosis Date  . Asymptomatic menopausal state 02/20/1984   Overview:  Naturally occuring and asymptomatic Vaginal atrophy present on topical estrogen Overview:  Overview:  Naturally occuring and asymptomatic Vaginal atrophy present on topical estrogen  . Basal cell carcinoma of skin 02/19/2013   Overview:  Basal cell on her nasolabial fold 2014 S/p Mohs by Dr. Lacinda Axon Followed by local dermatologist regularly Overview:  Overview:  Followed by Dr. Lacinda Axon  . Cyst of ovary 02/19/2009   Overview:  Ovarian cyst on right stable 2004-2010 Cyst ? larger on CT 2012 Stable on follow up US Followed by Dr. Edwinna Areola Overview:  Overview:  Ovarian cyst on right stable 2004-2010 Cyst ? larger on CT 2012 Stable on follow up US Followed by Dr. Edwinna Areola  . Depression, major, single episode, complete remission ([place]) 07/18/2017  . Diffuse cystic mastopathy of breast 02/19/2013   Overview:  Mother with breast cancer later in life Two breast biopsies both with normal pathology Annual mammograms recommended, last 2016 Followed at the [facility]:  Relevant Hx: Course:  Daily Update: Today's Plan: Overview:  Overview:  Mother with breast cancer later in life Two breast biopsies both with normal pathology Annual mammograms recommended, last 79  . Essential (primary) hypertension 02/19/2013   Overview:  Goal blood pressure < 135/85 On atenolol Overview:  Overview:  Goal blood pressure < 135/85 On atenolol  . Hyperlipidemia 02/19/2013   Overview:  Goal LDL < 130, last value 92 in 2017 On Pravachol Overview:  Overview:  Goal LDL < 130, last value 82 in 2012 On Pravachol  . Hypothyroidism 02/28/2012   Overview:  started on T4  TFT normal 2017 Followed by Dr. Almon Hercules Overview:  Overview:  started on T4  TFT normal 2014 Followed by Dr. Almon Hercules  . Impaired glucose tolerance 02/20/2003   Overview:  Overview:  Glucose goal < 100, last value 94 in 2014 HgbA1c goal < 6.0, last value 5.3 in 2014 Urine microalbumin negative in 2014 On diet and exercise  . Nontoxic multinodular goiter 03/06/2003   Overview:  Asymptomatic nodular thyroid Stable [place] 2004-2014 Followed by Dr. Almon Hercules Overview:  Overview:  Asymptomatic nodular thyroid Stable [place] 2004-2014 Followed by Dr. Almon Hercules  . OSA on CPAP 01/13/2015   Overview:  Sleep study sent to medical records:   Sleep efficiency 22%, latency 24 min, # awakenings 11, res desat N=10, index 17.8  Apnea 1 for 16 sec, hypopnea 16 for 18 sec, total sleep time  only 90 min so index = 11 She did not sleep much and the index puts her in mild category Repeat study a few days later when she slept 187 min her AHI = 4.6 which is normal, average O2 sat 94% I don't think   . Osteoarthritis 02/20/2003   Overview:  Mild bilateral hands Right CMC Right hip Left knee s/p meniscus tear All symptomatically stable Overview:  Overview:  Mild bilateral hands Right CMC Right hip Left knee s/p meniscus tear All symptomatically stable  . Osteoporosis 02/20/2003   Overview:  Bone density 2004 LS -3.32 and RH-1.62 Bone density 2008 LS -3.14 and RH -1.22 Bone density 2009 LS -3.17  and RH -1.21 Bone density 2010 LS -3.14 and RH -1.42  Bone density 2014 LS -2.7 (L3 -3.1) and RH -1.5 Bone density 2017 LS -2.3 and RH -1.4 (neck -1.7) Vitamin D level 38 in 2012 On Boniva 2004 to 2010, restarted 2015 Repeat bone density in 2020 and stop Boniva  Last Assessment & Pl  . Prediabetes 02/20/2003   Overview:  Glucose goal < 100, last value 91 in 2017 HgbA1c goal < 6.0, last value 5.6 in 2017 Urine microalbumin negative in 2017 On diet and exercise  . Recurrent UTI 10/19/2014    Past Surgical History:  Procedure Laterality Date  . ABDOMINAL HYSTERECTOMY  1975  . URETEROSCOPY    . URETEROSCOPY WITH HOLMIUM LASER LITHOTRIPSY      There were no vitals filed for this visit.  Subjective Assessment - 11/12/19 1353    Subjective  Just a little soreness in R hip (iliac crest). 3/10 soreness currently.    Pertinent History  LE weakness, unsteadiness. Symptoms began in the last few months. Gradual onset. Legs feeling shaky and wobbly, especially in the thighs. UE do not feel wobbly. Has not had the problem before. No pain or discomfort. No back pain. No falls in the last 6 months (has fear of falling). Pt currently lives in the [facility] at [place]. Does not use an AD. Has not yet had PT before. Exercises about 35 min a day (stationary bike, elliptical, and the Nustep)    Patient Stated Goals  Improve strength and balance.    Currently in Pain?  Yes    Pain Score  3                                PT Education - 11/12/19 1422    Education Details  ther-ex    Person(s) Educated  Patient    Methods  Explanation;Demonstration;Tactile cues;Verbal cues    Comprehension  Returned demonstration;Verbalized understanding      Objective  No latex band allergies  Blood pressure is controlledper pt    L Pelvic drop during R LE stance phase with unsteadiness dizziness when turning to the R in supine   Usually feels more off balance early in the morning when  getting out of bed. Feels a little"off kilter". Gets out at the R side of the bed.  (dizziness with supine R rotation.  [name] (-) bilaterally  MedbridgeAccess Code: [id number]  Therapeutic exercise  seated trunk flexion 10x2 with  5 second holds  Then to the L 10x2 with 5 second holds   No R hip soreness afterwards  Stepping over 4 mini hurdles with CGA 6x. Min A only on first hurdle  Standing hip abduction with band around ankles yellow with B UE assist  R 10x3  L 10x3    Good glute med muscle use felt  Seated hip extension isometrics alternating   R 10x5 seconds for 2 sets   L 10x5 seconds for 2 sets   SLS with B UE assist, emphasis on level pelvis  R 10x5 seconds for 2 sets  L 10x5 seconds for 2 sets  Standing low rows yellow band to promote trunk strength during gait 10x5 seconds for 3 sets  Semi tandem walk 10 ft 4x min A. Cues to maintain center of gravity over base of support.    Gait observation. Pt tendency for R lateral lean during R LE stance phase.     Improved exercise technique, movement at target joints, use of target muscles after mod verbal, visual, tactile cues.   Response to treatment Good muscle use felt with exercises.   Clinical Impression  Pt reports R hip soreness at start of session. Performed stretches to improve R intervertebral foraminal space which alleviated symptom. Continued working on improving glute med and max strength and trunk control to decrease lateral lean compensation during gait to decrease unsteadiness. Pt tolerated session well without aggravation of symptoms. Pt will benefit from continued skilled physical therapy services to decrease hip soreness, improve LE strength, balance, and function.      PT Short Term Goals - 10/22/19 1854      PT SHORT TERM GOAL #1   Title  Pt will be independent wiht her HEP to improve strength, balance, and function.    Time  3    Period  Weeks    Status   New    Target Date  11/14/19        PT Long Term Goals - 10/22/19 1855      PT LONG TERM GOAL #1   Title  Patient will improve bilateral hip abduction and extension strength to improve steadiness during gait and decrease fall risk.    Time  8    Period  Weeks    Status  New    Target Date  12/19/19      PT LONG TERM GOAL #2   Title  Patient will improve her Berg Balance score to 48/56 as a demonstration of improved balance.    Baseline  42/56 (10/22/2019)    Time  8    Period  Weeks    Status  New    Target Date  12/19/19            Plan - 11/12/19 1427    Clinical Impression Statement  Pt reports R hip soreness at start of session. Performed stretches to improve R intervertebral foraminal space which alleviated symptom. Continued working on improving glute med and max strength and trunk control to decrease lateral lean compensation during gait to decrease unsteadiness. Pt tolerated session well without aggravation of symptoms. Pt will benefit from continued skilled physical therapy services to decrease hip soreness, improve LE strength, balance, and function.    Personal Factors and Comorbidities  Age;Comorbidity 3+;Past/Current Experience;Time since onset of injury/illness/exacerbation;Fitness    Comorbidities  HTN, OA, osteoporosis    Examination-Activity Limitations  Carry;Locomotion Level    Stability/Clinical Decision Making  Stable/Uncomplicated    Rehab Potential  Fair    PT Frequency  2x / week    PT Duration  8 weeks    PT Treatment/Interventions  Neuromuscular re-education;Therapeutic exercise;Therapeutic activities;Functional mobility training;Balance training;Patient/family education;Manual techniques;Dry needling;Vestibular;Aquatic Therapy;Canalith Repostioning;Electrical Stimulation;Iontophoresis 4mg /ml Dexamethasone;Gait training;Stair training    PT Next Visit Plan  glute med, max, strunk strengthening, lumbopelvic control. Manual techniques, modalities PRN     Consulted and Agree with Plan of Care  Patient       Patient will benefit from skilled therapeutic intervention in order to improve the following deficits and impairments:  Postural dysfunction, Improper body mechanics, Decreased strength, Decreased balance  Visit Diagnosis: Muscle weakness (generalized)  Unsteadiness on feet     Problem List Patient Active Problem List   Diagnosis Date Noted  . Leg weakness 10/13/2019  . Headache 10/10/2019  . Breast tenderness 10/10/2019  . Inversion of right nipple 10/10/2019  . Acid reflux 09/08/2019  . Breast cancer screening 06/08/2019  . Cracked lip 05/25/2019  . SOBOE (shortness of breath on exertion) 05/20/2019  . Nocturia 03/21/2019  . Personal history of kidney stones 03/21/2019  . Depression, major, single episode, complete remission ([place]) 07/18/2017  . Insomnia secondary to depression with anxiety 02/25/2017  . Adjustment reaction with anxiety and depression 02/26/2016  . Encounter for counseling 12/01/2015  . Microhematuria 03/10/2015  . OSA (obstructive sleep apnea) 01/13/2015  . Nephrolithiasis 12/30/2014  . Recurrent UTI 10/19/2014  . Hyperlipidemia 02/19/2013  . Essential (primary) hypertension 02/19/2013  . Diffuse cystic mastopathy of breast 02/19/2013  . Basal cell carcinoma of skin 02/19/2013  . Hypothyroidism 02/28/2012  . Cyst of ovary 02/19/2009  . Nontoxic multinodular goiter 03/06/2003  . Prediabetes 02/20/2003  . Osteoporosis 02/20/2003  . Osteoarthritis 02/20/2003  . Impaired glucose tolerance 02/20/2003  . Asymptomatic menopausal state 02/20/1984    Joneen Boers PT, DPT   11/12/2019, 2:47 PM  [facility] [address] [place], [address] Phone: [phone]   Fax:  [phone]  Name: MARCILE OMURA MRN: [id number] Date of Birth: [birth year]

## 2019-11-14 ENCOUNTER — Ambulatory Visit: Payer: Medicare HMO

## 2019-11-14 DIAGNOSIS — H903 Sensorineural hearing loss, bilateral: Secondary | ICD-10-CM | POA: Diagnosis not present

## 2019-11-14 DIAGNOSIS — J340 Abscess, furuncle and carbuncle of nose: Secondary | ICD-10-CM | POA: Diagnosis not present

## 2019-11-14 DIAGNOSIS — H6123 Impacted cerumen, bilateral: Secondary | ICD-10-CM | POA: Diagnosis not present

## 2019-11-19 ENCOUNTER — Ambulatory Visit: Payer: Medicare HMO | Attending: Internal Medicine

## 2019-11-19 ENCOUNTER — Other Ambulatory Visit: Payer: Self-pay

## 2019-11-19 DIAGNOSIS — R2681 Unsteadiness on feet: Secondary | ICD-10-CM | POA: Diagnosis not present

## 2019-11-19 DIAGNOSIS — M6281 Muscle weakness (generalized): Secondary | ICD-10-CM | POA: Diagnosis not present

## 2019-11-19 NOTE — Therapy (Signed)
[facility] [address] [place], [address] Phone: [phone]   Fax:  [phone]  Physical Therapy Treatment  Patient Details  Name: Jennifer Bernard MRN: [id number] Date of Birth: [birth year] Referring Provider (PT): Einar Pheasant, MD   Encounter Date: 11/19/2019  PT End of Session - 11/19/19 1414    Visit Number  7    Number of Visits  17    Date for PT Re-Evaluation  12/19/19    Authorization Type  7    Authorization Time Period  of 10 progress report    PT Start Time  1355    PT Stop Time  1430    PT Time Calculation (min)  35 min    Activity Tolerance  Patient tolerated treatment well    Behavior During Therapy  [facility] for tasks assessed/performed       Past Medical History:  Diagnosis Date  . Asymptomatic menopausal state 02/20/1984   Overview:  Naturally occuring and asymptomatic Vaginal atrophy present on topical estrogen Overview:  Overview:  Naturally occuring and asymptomatic Vaginal atrophy present on topical estrogen  . Basal cell carcinoma of skin 02/19/2013   Overview:  Basal cell on her nasolabial fold 2014 S/p Mohs by Dr. Lacinda Axon Followed by local dermatologist regularly Overview:  Overview:  Followed by Dr. Lacinda Axon  . Cyst of ovary 02/19/2009   Overview:  Ovarian cyst on right stable 2004-2010 Cyst ? larger on CT 2012 Stable on follow up US Followed by Dr. Edwinna Areola Overview:  Overview:  Ovarian cyst on right stable 2004-2010 Cyst ? larger on CT 2012 Stable on follow up US Followed by Dr. Edwinna Areola  . Depression, major, single episode, complete remission ([place]) 07/18/2017  . Diffuse cystic mastopathy of breast 02/19/2013   Overview:  Mother with breast cancer later in life Two breast biopsies both with normal pathology Annual mammograms recommended, last 2016 Followed at the [facility]:  Relevant Hx: Course: Daily Update: Today's Plan: Overview:  Overview:   Mother with breast cancer later in life Two breast biopsies both with normal pathology Annual mammograms recommended, last 16  . Essential (primary) hypertension 02/19/2013   Overview:  Goal blood pressure < 135/85 On atenolol Overview:  Overview:  Goal blood pressure < 135/85 On atenolol  . Hyperlipidemia 02/19/2013   Overview:  Goal LDL < 130, last value 92 in 2017 On Pravachol Overview:  Overview:  Goal LDL < 130, last value 82 in 2012 On Pravachol  . Hypothyroidism 02/28/2012   Overview:  started on T4  TFT normal 2017 Followed by Dr. Almon Hercules Overview:  Overview:  started on T4  TFT normal 2014 Followed by Dr. Almon Hercules  . Impaired glucose tolerance 02/20/2003   Overview:  Overview:  Glucose goal < 100, last value 94 in 2014 HgbA1c goal < 6.0, last value 5.3 in 2014 Urine microalbumin negative in 2014 On diet and exercise  . Nontoxic multinodular goiter 03/06/2003   Overview:  Asymptomatic nodular thyroid Stable [place] 2004-2014 Followed by Dr. Almon Hercules Overview:  Overview:  Asymptomatic nodular thyroid Stable [place] 2004-2014 Followed by Dr. Almon Hercules  . OSA on CPAP 01/13/2015   Overview:  Sleep study sent to medical records:   Sleep efficiency 22%, latency 24 min, # awakenings 11, res desat N=10, index 17.8  Apnea 1 for 16 sec, hypopnea 16 for 18 sec, total sleep time only 90 min so index = 11 She did not  sleep much and the index puts her in mild category Repeat study a few days later when she slept 187 min her AHI = 4.6 which is normal, average O2 sat 94% I don't think   . Osteoarthritis 02/20/2003   Overview:  Mild bilateral hands Right CMC Right hip Left knee s/p meniscus tear All symptomatically stable Overview:  Overview:  Mild bilateral hands Right CMC Right hip Left knee s/p meniscus tear All symptomatically stable  . Osteoporosis 02/20/2003   Overview:  Bone density 2004 LS -3.32 and RH-1.62 Bone density 2008 LS -3.14 and RH -1.22 Bone density 2009 LS -3.17 and RH -1.21 Bone density 2010 LS -3.14 and RH  -1.42  Bone density 2014 LS -2.7 (L3 -3.1) and RH -1.5 Bone density 2017 LS -2.3 and RH -1.4 (neck -1.7) Vitamin D level 38 in 2012 On Boniva 2004 to 2010, restarted 2015 Repeat bone density in 2020 and stop Boniva  Last Assessment & Pl  . Prediabetes 02/20/2003   Overview:  Glucose goal < 100, last value 91 in 2017 HgbA1c goal < 6.0, last value 5.6 in 2017 Urine microalbumin negative in 2017 On diet and exercise  . Recurrent UTI 10/19/2014    Past Surgical History:  Procedure Laterality Date  . ABDOMINAL HYSTERECTOMY  1975  . URETEROSCOPY    . URETEROSCOPY WITH HOLMIUM LASER LITHOTRIPSY      There were no vitals filed for this visit.  Subjective Assessment - 11/19/19 1357    Subjective  Patient reported some soreness at start of session. Reported soreness of both hips and anterior thighs, has not done anything different this weekend, not sure why she's more sore.    Pertinent History  LE weakness, unsteadiness. Symptoms began in the last few months. Gradual onset. Legs feeling shaky and wobbly, especially in the thighs. UE do not feel wobbly. Has not had the problem before. No pain or discomfort. No back pain. No falls in the last 6 months (has fear of falling). Pt currently lives in the [facility] at [place]. Does not use an AD. Has not yet had PT before. Exercises about 35 min a day (stationary bike, elliptical, and the Nustep)    Patient Stated Goals  Improve strength and balance.    Currently in Pain?  Yes    Pain Score  3     Pain Location  Hip    Pain Orientation  Right;Left;Anterior;Lateral    Pain Descriptors / Indicators  Sore     155, late to session  Objective    No latex band allergies    Blood pressure is controlled per pt       L Pelvic drop during R LE stance phase with unsteadiness dizziness when turning to the R in supine    Usually feels more off balance early in the morning when getting out of bed. Feels a little "off kilter". Gets out at the R side of the bed.               (dizziness with supine R rotation.  [name] (-) bilaterally    Medbridge Access Code: [id number]   Therapeutic exercise    seated trunk flexion 10x2 with  5 second holds             Then to the L 10x2 with 5 second holds               No R hip soreness afterwards   Standing hip abduction with band around ankles  yellow with B UE assist  Standing hip extension with band around ankles yellow BUE             R 10x3             L 10x3      seated hamstring stretch 2x30sec ea Seated piriformis stretch 2x30sec   SLS with B UE assist, emphasis on level pelvis             R 10x5 seconds for 2 sets             L 10x5 seconds for 2 sets    Manual therapy: STM to bilateral anterior and lateral hips, use of roller stick  Improved exercise technique, movement at target joints, use of target muscles after mod verbal, visual, tactile cues.      Response to treatment Good muscle use felt with exercises.    Clinical Impression/pt response: Pt reported wanting to focus on decreasing bilateral hip soreness due to planned activity tomorrow. Instructed in seated stretches with some resolved of soreness noted. The patient was able to perform therapeutic exercises without increase in pain/symptoms. The patient would benefit from further skilled PT to maximize mobility and progress towards goals.      PT Education - 11/19/19 1359    Education Details  therex    Person(s) Educated  Patient    Methods  Demonstration;Explanation;Tactile cues    Comprehension  Verbalized understanding;Returned demonstration       PT Short Term Goals - 10/22/19 1854      PT SHORT TERM GOAL #1   Title  Pt will be independent wiht her HEP to improve strength, balance, and function.    Time  3    Period  Weeks    Status  New    Target Date  11/14/19        PT Long Term Goals - 10/22/19 1855      PT LONG TERM GOAL #1   Title  Patient will improve bilateral hip abduction and extension  strength to improve steadiness during gait and decrease fall risk.    Time  8    Period  Weeks    Status  New    Target Date  12/19/19      PT LONG TERM GOAL #2   Title  Patient will improve her Berg Balance score to 48/56 as a demonstration of improved balance.    Baseline  42/56 (10/22/2019)    Time  8    Period  Weeks    Status  New    Target Date  12/19/19            Plan - 11/19/19 1414    Clinical Impression Statement  Pt reported wanting to focus on decreasing bilateral hip soreness due to planned activity tomorrow. Instructed in seated stretches with some resolved of soreness noted. The patient was able to perform therapeutic exercises without increase in pain/symptoms. The patient would benefit from further skilled PT to maximize mobility and progress towards goals.    Personal Factors and Comorbidities  Age;Comorbidity 3+;Past/Current Experience;Time since onset of injury/illness/exacerbation;Fitness    Comorbidities  HTN, OA, osteoporosis    Examination-Activity Limitations  Carry;Locomotion Level    Stability/Clinical Decision Making  Stable/Uncomplicated    Rehab Potential  Fair    PT Frequency  2x / week    PT Duration  8 weeks    PT Treatment/Interventions  Neuromuscular re-education;Therapeutic exercise;Therapeutic activities;Functional mobility training;Balance training;Patient/family education;Manual techniques;Dry needling;Vestibular;Aquatic  Therapy;Canalith Repostioning;Electrical Stimulation;Iontophoresis 4mg /ml Dexamethasone;Gait training;Stair training    PT Next Visit Plan  glute med, max, strunk strengthening, lumbopelvic control. Manual techniques, modalities PRN    Consulted and Agree with Plan of Care  Patient       Patient will benefit from skilled therapeutic intervention in order to improve the following deficits and impairments:  Postural dysfunction, Improper body mechanics, Decreased strength, Decreased balance  Visit Diagnosis: Muscle weakness  (generalized)  Unsteadiness on feet     Problem List Patient Active Problem List   Diagnosis Date Noted  . Leg weakness 10/13/2019  . Headache 10/10/2019  . Breast tenderness 10/10/2019  . Inversion of right nipple 10/10/2019  . Acid reflux 09/08/2019  . Breast cancer screening 06/08/2019  . Cracked lip 05/25/2019  . SOBOE (shortness of breath on exertion) 05/20/2019  . Nocturia 03/21/2019  . Personal history of kidney stones 03/21/2019  . Depression, major, single episode, complete remission ([place]) 07/18/2017  . Insomnia secondary to depression with anxiety 02/25/2017  . Adjustment reaction with anxiety and depression 02/26/2016  . Encounter for counseling 12/01/2015  . Microhematuria 03/10/2015  . OSA (obstructive sleep apnea) 01/13/2015  . Nephrolithiasis 12/30/2014  . Recurrent UTI 10/19/2014  . Hyperlipidemia 02/19/2013  . Essential (primary) hypertension 02/19/2013  . Diffuse cystic mastopathy of breast 02/19/2013  . Basal cell carcinoma of skin 02/19/2013  . Hypothyroidism 02/28/2012  . Cyst of ovary 02/19/2009  . Nontoxic multinodular goiter 03/06/2003  . Prediabetes 02/20/2003  . Osteoporosis 02/20/2003  . Osteoarthritis 02/20/2003  . Impaired glucose tolerance 02/20/2003  . Asymptomatic menopausal state 02/20/1984    Lieutenant Diego PT, DPT 2:32 PM,11/19/19 c  [facility] [address] [place], [address] Phone: [phone]   Fax:  [phone]  Name: Jennifer Bernard MRN: [id number] Date of Birth: 18-Feb-1927

## 2019-11-20 ENCOUNTER — Ambulatory Visit: Payer: Medicare HMO

## 2019-11-21 ENCOUNTER — Ambulatory Visit: Payer: Medicare HMO

## 2019-11-21 ENCOUNTER — Other Ambulatory Visit: Payer: Self-pay

## 2019-11-21 DIAGNOSIS — R2681 Unsteadiness on feet: Secondary | ICD-10-CM

## 2019-11-21 DIAGNOSIS — M6281 Muscle weakness (generalized): Secondary | ICD-10-CM

## 2019-11-21 NOTE — Therapy (Signed)
[facility] [address], [place], [address] Phone: [phone]   Fax:  [phone]  Physical Therapy Treatment  Patient Details  Name: Jennifer Bernard MRN: [id number] Date of Birth: [birth year] Referring Provider (PT): Einar Pheasant, MD   Encounter Date: 11/21/2019  PT End of Session - 11/21/19 1446    Visit Number  8    Number of Visits  17    Date for PT Re-Evaluation  12/19/19    Authorization Type  8    Authorization Time Period  of 10 progress report    PT Start Time  1446    PT Stop Time  1514    PT Time Calculation (min)  28 min    Activity Tolerance  Patient tolerated treatment well    Behavior During Therapy  [facility] for tasks assessed/performed       Past Medical History:  Diagnosis Date  . Asymptomatic menopausal state 02/20/1984   Overview:  Naturally occuring and asymptomatic Vaginal atrophy present on topical estrogen Overview:  Overview:  Naturally occuring and asymptomatic Vaginal atrophy present on topical estrogen  . Basal cell carcinoma of skin 02/19/2013   Overview:  Basal cell on her nasolabial fold 2014 S/p Mohs by Dr. Lacinda Axon Followed by local dermatologist regularly Overview:  Overview:  Followed by Dr. Lacinda Axon  . Cyst of ovary 02/19/2009   Overview:  Ovarian cyst on right stable 2004-2010 Cyst ? larger on CT 2012 Stable on follow up US Followed by Dr. Edwinna Areola Overview:  Overview:  Ovarian cyst on right stable 2004-2010 Cyst ? larger on CT 2012 Stable on follow up US Followed by Dr. Edwinna Areola  . Depression, major, single episode, complete remission ([place]) 07/18/2017  . Diffuse cystic mastopathy of breast 02/19/2013   Overview:  Mother with breast cancer later in life Two breast biopsies both with normal pathology Annual mammograms recommended, last 2016 Followed at the [facility]:  Relevant Hx: Course: Daily Update: Today's Plan: Overview:  Overview:   Mother with breast cancer later in life Two breast biopsies both with normal pathology Annual mammograms recommended, last 32  . Essential (primary) hypertension 02/19/2013   Overview:  Goal blood pressure < 135/85 On atenolol Overview:  Overview:  Goal blood pressure < 135/85 On atenolol  . Hyperlipidemia 02/19/2013   Overview:  Goal LDL < 130, last value 92 in 2017 On Pravachol Overview:  Overview:  Goal LDL < 130, last value 82 in 2012 On Pravachol  . Hypothyroidism 02/28/2012   Overview:  started on T4  TFT normal 2017 Followed by Dr. Almon Hercules Overview:  Overview:  started on T4  TFT normal 2014 Followed by Dr. Almon Hercules  . Impaired glucose tolerance 02/20/2003   Overview:  Overview:  Glucose goal < 100, last value 94 in 2014 HgbA1c goal < 6.0, last value 5.3 in 2014 Urine microalbumin negative in 2014 On diet and exercise  . Nontoxic multinodular goiter 03/06/2003   Overview:  Asymptomatic nodular thyroid Stable [place] 2004-2014 Followed by Dr. Almon Hercules Overview:  Overview:  Asymptomatic nodular thyroid Stable [place] 2004-2014 Followed by Dr. Almon Hercules  . OSA on CPAP 01/13/2015   Overview:  Sleep study sent to medical records:   Sleep efficiency 22%, latency 24 min, # awakenings 11, res desat N=10, index 17.8  Apnea 1 for 16 sec, hypopnea 16 for 18 sec, total sleep time only 90 min so index = 11 She did not  sleep much and the index puts her in mild category Repeat study a few days later when she slept 187 min her AHI = 4.6 which is normal, average O2 sat 94% I don't think   . Osteoarthritis 02/20/2003   Overview:  Mild bilateral hands Right CMC Right hip Left knee s/p meniscus tear All symptomatically stable Overview:  Overview:  Mild bilateral hands Right CMC Right hip Left knee s/p meniscus tear All symptomatically stable  . Osteoporosis 02/20/2003   Overview:  Bone density 2004 LS -3.32 and RH-1.62 Bone density 2008 LS -3.14 and RH -1.22 Bone density 2009 LS -3.17 and RH -1.21 Bone density 2010 LS -3.14 and RH  -1.42  Bone density 2014 LS -2.7 (L3 -3.1) and RH -1.5 Bone density 2017 LS -2.3 and RH -1.4 (neck -1.7) Vitamin D level 38 in 2012 On Boniva 2004 to 2010, restarted 2015 Repeat bone density in 2020 and stop Boniva  Last Assessment & Pl  . Prediabetes 02/20/2003   Overview:  Glucose goal < 100, last value 91 in 2017 HgbA1c goal < 6.0, last value 5.6 in 2017 Urine microalbumin negative in 2017 On diet and exercise  . Recurrent UTI 10/19/2014    Past Surgical History:  Procedure Laterality Date  . ABDOMINAL HYSTERECTOMY  1975  . URETEROSCOPY    . URETEROSCOPY WITH HOLMIUM LASER LITHOTRIPSY      There were no vitals filed for this visit.  Subjective Assessment - 11/21/19 1448    Subjective  Doing pretty good. The R lateral hip soreness is better. 3/10 currently.    Pertinent History  LE weakness, unsteadiness. Symptoms began in the last few months. Gradual onset. Legs feeling shaky and wobbly, especially in the thighs. UE do not feel wobbly. Has not had the problem before. No pain or discomfort. No back pain. No falls in the last 6 months (has fear of falling). Pt currently lives in the [facility] [place]. Does not use an AD. Has not yet had PT before. Exercises about 35 min a day (stationary bike, elliptical, and the Nustep)    Patient Stated Goals  Improve strength and balance.    Currently in Pain?  Yes    Pain Score  3                                PT Education - 11/21/19 1453    Education Details  ther-ex    Person(s) Educated  Patient    Methods  Explanation;Demonstration;Tactile cues;Verbal cues    Comprehension  Returned demonstration;Verbalized understanding      Objective  No latex band allergies  Blood pressure is controlledper pt    L Pelvic drop during R LE stance phase with unsteadiness dizziness when turning to the R in supine   Usually feels more off balance early in the morning when getting out of bed. Feels a little"off  kilter". Gets out at the R side of the bed.  (dizziness with supine R rotation.  [name] (-) bilaterally  MedbridgeAccess Code: [id number]    Therapeutic exercise  R anterior hip joint soreness reproduced with seated R SKTC  Seated R hip extension isometrics 10x5 seconds for 3 sets  No R hip soreness afterwards  SLS with B UE assist to promote glute med muscle strength and LE balance  R 10x5 seconds for 2 sets  L 10x5 seconds for 2 sets  Standing alternating toe taps onto 4  inch step with riser  R 10x2  L 10x2  Standing with one foot in front of the other (not tandem)  R foot in front 30 seconds x 2   L foot in front 30 seconds x 2    Improved exercise technique, movement at target joints, use of target muscles after mod verbal, visual, tactile cues.   Response to treatment Good muscle use felt with exercises.   Clinical Impression/pt response:  Better able to maintain balance in with one foot in front of the other. Continued working on glute med strengthening to help decrease lateral lean during single leg stance phases of gait and decrease unsteadiness. Possible R hip impingement source of hip soreness. No R hip soreness after glute max strengthening. Pt tolerated session well without aggravation of symptoms. Pt will benefit from continued skilled physical therapy services to improve strength, balance, function, and decrease fall risk.          PT Short Term Goals - 10/22/19 1854      PT SHORT TERM GOAL #1   Title  Pt will be independent wiht her HEP to improve strength, balance, and function.    Time  3    Period  Weeks    Status  New    Target Date  11/14/19        PT Long Term Goals - 10/22/19 1855      PT LONG TERM GOAL #1   Title  Patient will improve bilateral hip abduction and extension strength to improve steadiness during gait and decrease fall risk.    Time  8    Period  Weeks    Status  New    Target Date  12/19/19       PT LONG TERM GOAL #2   Title  Patient will improve her Berg Balance score to 48/56 as a demonstration of improved balance.    Baseline  42/56 (10/22/2019)    Time  8    Period  Weeks    Status  New    Target Date  12/19/19            Plan - 11/21/19 1454    Clinical Impression Statement  Better able to maintain balance in with one foot in front of the other. Continued working on glute med strengthening to help decrease lateral lean during single leg stance phases of gait and decrease unsteadiness. Possible R hip impingement source of hip soreness. No R hip soreness after glute max strengthening. Pt tolerated session well without aggravation of symptoms. Pt will benefit from continued skilled physical therapy services to improve strength, balance, function, and decrease fall risk.    Personal Factors and Comorbidities  Age;Comorbidity 3+;Past/Current Experience;Time since onset of injury/illness/exacerbation;Fitness    Comorbidities  HTN, OA, osteoporosis    Examination-Activity Limitations  Carry;Locomotion Level    Stability/Clinical Decision Making  Stable/Uncomplicated    Rehab Potential  Fair    PT Frequency  2x / week    PT Duration  8 weeks    PT Treatment/Interventions  Neuromuscular re-education;Therapeutic exercise;Therapeutic activities;Functional mobility training;Balance training;Patient/family education;Manual techniques;Dry needling;Vestibular;Aquatic Therapy;Canalith Repostioning;Electrical Stimulation;Iontophoresis 4mg /ml Dexamethasone;Gait training;Stair training    PT Next Visit Plan  glute med, max, strunk strengthening, lumbopelvic control. Manual techniques, modalities PRN    Consulted and Agree with Plan of Care  Patient       Patient will benefit from skilled therapeutic intervention in order to improve the following deficits and impairments:  Postural dysfunction, Improper body mechanics,  Decreased strength, Decreased balance  Visit Diagnosis: Muscle  weakness (generalized)  Unsteadiness on feet     Problem List Patient Active Problem List   Diagnosis Date Noted  . Leg weakness 10/13/2019  . Headache 10/10/2019  . Breast tenderness 10/10/2019  . Inversion of right nipple 10/10/2019  . Acid reflux 09/08/2019  . Breast cancer screening 06/08/2019  . Cracked lip 05/25/2019  . SOBOE (shortness of breath on exertion) 05/20/2019  . Nocturia 03/21/2019  . Personal history of kidney stones 03/21/2019  . Depression, major, single episode, complete remission ([place]) 07/18/2017  . Insomnia secondary to depression with anxiety 02/25/2017  . Adjustment reaction with anxiety and depression 02/26/2016  . Encounter for counseling 12/01/2015  . Microhematuria 03/10/2015  . OSA (obstructive sleep apnea) 01/13/2015  . Nephrolithiasis 12/30/2014  . Recurrent UTI 10/19/2014  . Hyperlipidemia 02/19/2013  . Essential (primary) hypertension 02/19/2013  . Diffuse cystic mastopathy of breast 02/19/2013  . Basal cell carcinoma of skin 02/19/2013  . Hypothyroidism 02/28/2012  . Cyst of ovary 02/19/2009  . Nontoxic multinodular goiter 03/06/2003  . Prediabetes 02/20/2003  . Osteoporosis 02/20/2003  . Osteoarthritis 02/20/2003  . Impaired glucose tolerance 02/20/2003  . Asymptomatic menopausal state 02/20/1984    Joneen Boers PT, DPT   11/21/2019, 5:27 PM  [facility] [address], [place], [address] Phone: [phone]   Fax:  [phone]  Name: Jennifer Bernard MRN: [id number] Date of Birth: [birth year]

## 2019-11-25 ENCOUNTER — Other Ambulatory Visit: Payer: Self-pay

## 2019-11-25 ENCOUNTER — Ambulatory Visit
Admission: RE | Admit: 2019-11-25 | Discharge: 2019-11-25 | Disposition: A | Discharge status: Home / Self Care | Payer: Medicare HMO | Source: Ambulatory Visit | Attending: Internal Medicine | Admitting: Internal Medicine

## 2019-11-25 DIAGNOSIS — R519 Headache, unspecified: Secondary | ICD-10-CM | POA: Diagnosis not present

## 2019-11-25 MED ORDER — GADOBUTROL 1 MMOL/ML IV SOLN
6.0000 mL | Freq: Once | INTRAVENOUS | Status: AC | PRN
  Administered 2019-11-25: 6 mL via INTRAVENOUS

## 2019-11-26 ENCOUNTER — Ambulatory Visit: Payer: Medicare HMO

## 2019-11-26 DIAGNOSIS — M6281 Muscle weakness (generalized): Secondary | ICD-10-CM

## 2019-11-26 DIAGNOSIS — R2681 Unsteadiness on feet: Secondary | ICD-10-CM | POA: Diagnosis not present

## 2019-11-26 NOTE — Therapy (Signed)
[facility] [address] [place], [address] Phone: [phone]   Fax:  [phone]  Physical Therapy Treatment  Patient Details  Name: Jennifer Bernard MRN: [id number] Date of Birth: May 21, 1927 Referring Provider (PT): Jennifer Pheasant, MD   Encounter Date: 11/26/2019  PT End of Session - 11/26/19 1349    Visit Number  9    Number of Visits  17    Date for PT Re-Evaluation  12/19/19    Authorization Type  9    Authorization Time Period  of 10 progress report    PT Start Time  1349    PT Stop Time  1430    PT Time Calculation (min)  41 min    Activity Tolerance  Patient tolerated treatment well    Behavior During Therapy  [facility] for tasks assessed/performed       Past Medical History:  Diagnosis Date  . Asymptomatic menopausal state 02/20/1984   Overview:  Naturally occuring and asymptomatic Vaginal atrophy present on topical estrogen Overview:  Overview:  Naturally occuring and asymptomatic Vaginal atrophy present on topical estrogen  . Basal cell carcinoma of skin 02/19/2013   Overview:  Basal cell on her nasolabial fold 2014 S/p Mohs by Dr. Lacinda Axon Followed by local dermatologist regularly Overview:  Overview:  Followed by Dr. Lacinda Axon  . Cyst of ovary 02/19/2009   Overview:  Ovarian cyst on right stable 2004-2010 Cyst ? larger on CT 2012 Stable on follow up US Followed by Dr. Edwinna Areola Overview:  Overview:  Ovarian cyst on right stable 2004-2010 Cyst ? larger on CT 2012 Stable on follow up US Followed by Dr. Edwinna Areola  . Depression, major, single episode, complete remission ([place]) 07/18/2017  . Diffuse cystic mastopathy of breast 02/19/2013   Overview:  Mother with breast cancer later in life Two breast biopsies both with normal pathology Annual mammograms recommended, last 2016 Followed at the [facility]:  Relevant Hx: Course: Daily Update: Today's Plan: Overview:  Overview:   Mother with breast cancer later in life Two breast biopsies both with normal pathology Annual mammograms recommended, last 75  . Essential (primary) hypertension 02/19/2013   Overview:  Goal blood pressure < 135/85 On atenolol Overview:  Overview:  Goal blood pressure < 135/85 On atenolol  . Hyperlipidemia 02/19/2013   Overview:  Goal LDL < 130, last value 92 in 2017 On Pravachol Overview:  Overview:  Goal LDL < 130, last value 82 in 2012 On Pravachol  . Hypothyroidism 02/28/2012   Overview:  started on T4  TFT normal 2017 Followed by Dr. Almon Hercules Overview:  Overview:  started on T4  TFT normal 2014 Followed by Dr. Almon Hercules  . Impaired glucose tolerance 02/20/2003   Overview:  Overview:  Glucose goal < 100, last value 94 in 2014 HgbA1c goal < 6.0, last value 5.3 in 2014 Urine microalbumin negative in 2014 On diet and exercise  . Nontoxic multinodular goiter 03/06/2003   Overview:  Asymptomatic nodular thyroid Stable [place] 2004-2014 Followed by Dr. Almon Hercules Overview:  Overview:  Asymptomatic nodular thyroid Stable [place] 2004-2014 Followed by Dr. Almon Hercules  . OSA on CPAP 01/13/2015   Overview:  Sleep study sent to medical records:   Sleep efficiency 22%, latency 24 min, # awakenings 11, res desat N=10, index 17.8  Apnea 1 for 16 sec, hypopnea 16 for 18 sec, total sleep time only 90 min so index = 11 She did not  sleep much and the index puts her in mild category Repeat study a few days later when she slept 187 min her AHI = 4.6 which is normal, average O2 sat 94% I don't think   . Osteoarthritis 02/20/2003   Overview:  Mild bilateral hands Right CMC Right hip Left knee s/p meniscus tear All symptomatically stable Overview:  Overview:  Mild bilateral hands Right CMC Right hip Left knee s/p meniscus tear All symptomatically stable  . Osteoporosis 02/20/2003   Overview:  Bone density 2004 LS -3.32 and RH-1.62 Bone density 2008 LS -3.14 and RH -1.22 Bone density 2009 LS -3.17 and RH -1.21 Bone density 2010 LS -3.14 and RH  -1.42  Bone density 2014 LS -2.7 (L3 -3.1) and RH -1.5 Bone density 2017 LS -2.3 and RH -1.4 (neck -1.7) Vitamin D level 38 in 2012 On Boniva 2004 to 2010, restarted 2015 Repeat bone density in 2020 and stop Boniva  Last Assessment & Pl  . Prediabetes 02/20/2003   Overview:  Glucose goal < 100, last value 91 in 2017 HgbA1c goal < 6.0, last value 5.6 in 2017 Urine microalbumin negative in 2017 On diet and exercise  . Recurrent UTI 10/19/2014    Past Surgical History:  Procedure Laterality Date  . ABDOMINAL HYSTERECTOMY  1975  . URETEROSCOPY    . URETEROSCOPY WITH HOLMIUM LASER LITHOTRIPSY      There were no vitals filed for this visit.  Subjective Assessment - 11/26/19 1350    Subjective  No pain or soreness today.    Pertinent History  LE weakness, unsteadiness. Symptoms began in the last few months. Gradual onset. Legs feeling shaky and wobbly, especially in the thighs. UE do not feel wobbly. Has not had the problem before. No pain or discomfort. No back pain. No falls in the last 6 months (has fear of falling). Pt currently lives in the [facility] at [place]. Does not use an AD. Has not yet had PT before. Exercises about 35 min a day (stationary bike, elliptical, and the Nustep)    Patient Stated Goals  Improve strength and balance.    Currently in Pain?  No/denies                               PT Education - 11/26/19 1406    Education Details  ther-ex    Person(s) Educated  Patient    Methods  Explanation;Demonstration;Tactile cues;Verbal cues    Comprehension  Returned demonstration;Verbalized understanding      Objective  No latex band allergies  Blood pressure is controlledper pt    L Pelvic drop during R LE stance phase with unsteadiness dizziness when turning to the R in supine   Usually feels more off balance early in the morning when getting out of bed. Feels a little"off kilter". Gets out at the R side of the bed.   (dizziness with supine R rotation.  [name] (-) bilaterally  MedbridgeAccess Code: [id number]    Therapeutic exercise  Seated R hip extension isometrics 10x5 seconds for 3 sets             Seated clamshell red band, hips less than 90 degrees flexion   10x3  Seated hip adductor ball and glute max squeeze 10x5 seconds    Gait on mulch outside garden with incline 32 ft down x 2, 32 ft up x 2 CGA. No LOB   SLS with B UE assist to promote  glute med muscle strength and LE balance             R 10x5 seconds for 2 sets             L 10x5 seconds for 2 sets  L glute med muscle weakness observed compared to R   Standing with one foot in front of the other (not tandem)             R foot in front 30 seconds x 3              L foot in front 30 seconds x 3   Min A at times to reach position but pt able to achieve position CGA only the majority of the time.   Standing alternating toe taps onto 4 inch step with riser             R 10x2             L 10x2    Improved exercise technique, movement at target joints, use of target muscles after mod verbal, visual, tactile cues.   Response to treatment Good muscle use felt with exercises. Pt tolerated session well without complain of pain.   Clinical Impression/pt response:  Able to ambulate up and down inclined on mulch on garden with CGA and no LOB. Improving ability maintain her center of gravity in a more narrow base of support. Good muscle use felt with exercises. Pt tolerated session well without complain of pain. Pt will benefit from continued skilled physical therapy services to improve strength, balance, function, and decrease fall risk.           PT Short Term Goals - 10/22/19 1854      PT SHORT TERM GOAL #1   Title  Pt will be independent wiht her HEP to improve strength, balance, and function.    Time  3    Period  Weeks    Status  New    Target Date  11/14/19        PT Long  Term Goals - 10/22/19 1855      PT LONG TERM GOAL #1   Title  Patient will improve bilateral hip abduction and extension strength to improve steadiness during gait and decrease fall risk.    Time  8    Period  Weeks    Status  New    Target Date  12/19/19      PT LONG TERM GOAL #2   Title  Patient will improve her Berg Balance score to 48/56 as a demonstration of improved balance.    Baseline  42/56 (10/22/2019)    Time  8    Period  Weeks    Status  New    Target Date  12/19/19            Plan - 11/26/19 1406    Clinical Impression Statement  Able to ambulate up and down inclined on mulch on garden with CGA and no LOB. Improving ability maintain her center of gravity in a more narrow base of support. Good muscle use felt with exercises. Pt tolerated session well without complain of pain. Pt will benefit from continued skilled physical therapy services to improve strength, balance, function, and decrease fall risk.    Personal Factors and Comorbidities  Age;Comorbidity 3+;Past/Current Experience;Time since onset of injury/illness/exacerbation;Fitness    Comorbidities  HTN, OA, osteoporosis    Examination-Activity Limitations  Carry;Locomotion Level    Stability/Clinical Decision Making  Stable/Uncomplicated    Rehab Potential  Fair    PT Frequency  2x / week    PT Duration  8 weeks    PT Treatment/Interventions  Neuromuscular re-education;Therapeutic exercise;Therapeutic activities;Functional mobility training;Balance training;Patient/family education;Manual techniques;Dry needling;Vestibular;Aquatic Therapy;Canalith Repostioning;Electrical Stimulation;Iontophoresis 4mg /ml Dexamethasone;Gait training;Stair training    PT Next Visit Plan  glute med, max, strunk strengthening, lumbopelvic control. Manual techniques, modalities PRN    Consulted and Agree with Plan of Care  Patient       Patient will benefit from skilled therapeutic intervention in order to improve the following  deficits and impairments:  Postural dysfunction, Improper body mechanics, Decreased strength, Decreased balance  Visit Diagnosis: Unsteadiness on feet  Muscle weakness (generalized)     Problem List Patient Active Problem List   Diagnosis Date Noted  . Leg weakness 10/13/2019  . Headache 10/10/2019  . Breast tenderness 10/10/2019  . Inversion of right nipple 10/10/2019  . Acid reflux 09/08/2019  . Breast cancer screening 06/08/2019  . Cracked lip 05/25/2019  . SOBOE (shortness of breath on exertion) 05/20/2019  . Nocturia 03/21/2019  . Personal history of kidney stones 03/21/2019  . Depression, major, single episode, complete remission ([place]) 07/18/2017  . Insomnia secondary to depression with anxiety 02/25/2017  . Adjustment reaction with anxiety and depression 02/26/2016  . Encounter for counseling 12/01/2015  . Microhematuria 03/10/2015  . OSA (obstructive sleep apnea) 01/13/2015  . Nephrolithiasis 12/30/2014  . Recurrent UTI 10/19/2014  . Hyperlipidemia 02/19/2013  . Essential (primary) hypertension 02/19/2013  . Diffuse cystic mastopathy of breast 02/19/2013  . Basal cell carcinoma of skin 02/19/2013  . Hypothyroidism 02/28/2012  . Cyst of ovary 02/19/2009  . Nontoxic multinodular goiter 03/06/2003  . Prediabetes 02/20/2003  . Osteoporosis 02/20/2003  . Osteoarthritis 02/20/2003  . Impaired glucose tolerance 02/20/2003  . Asymptomatic menopausal state 02/20/1984    Joneen Boers PT, DPT   11/26/2019, 6:19 PM  [facility] [address] [place], [address] Phone: [phone]   Fax:  [phone]  Name: Jennifer Bernard MRN: [id number] Date of Birth: 09/28/1926

## 2019-11-27 ENCOUNTER — Other Ambulatory Visit: Payer: Self-pay | Admitting: Internal Medicine

## 2019-11-27 DIAGNOSIS — R519 Headache, unspecified: Secondary | ICD-10-CM

## 2019-11-27 NOTE — Progress Notes (Signed)
Order placed for referral to neurology 

## 2019-11-28 ENCOUNTER — Other Ambulatory Visit: Payer: Self-pay

## 2019-11-28 ENCOUNTER — Ambulatory Visit: Payer: Medicare HMO

## 2019-11-28 DIAGNOSIS — M6281 Muscle weakness (generalized): Secondary | ICD-10-CM | POA: Diagnosis not present

## 2019-11-28 DIAGNOSIS — R2681 Unsteadiness on feet: Secondary | ICD-10-CM | POA: Diagnosis not present

## 2019-11-28 NOTE — Therapy (Signed)
[facility] [address], [place], [address] Phone: [phone]   Fax:  [phone]  Physical Therapy Treatment And Progress Report (10/22/2019 - 11/28/2019)  Patient Details  Name: Jennifer Bernard MRN: [id number] Date of Birth: February 15, 1927 Referring Provider (PT): Einar Pheasant, MD   Encounter Date: 11/28/2019  PT End of Session - 11/28/19 1405    Visit Number  10    Number of Visits  17    Date for PT Re-Evaluation  12/19/19    Authorization Type  10    Authorization Time Period  of 10 progress report    PT Start Time  1405    PT Stop Time  1445    PT Time Calculation (min)  40 min    Activity Tolerance  Patient tolerated treatment well    Behavior During Therapy  [facility] for tasks assessed/performed       Past Medical History:  Diagnosis Date  . Asymptomatic menopausal state 02/20/1984   Overview:  Naturally occuring and asymptomatic Vaginal atrophy present on topical estrogen Overview:  Overview:  Naturally occuring and asymptomatic Vaginal atrophy present on topical estrogen  . Basal cell carcinoma of skin 02/19/2013   Overview:  Basal cell on her nasolabial fold 2014 S/p Mohs by Dr. Lacinda Axon Followed by local dermatologist regularly Overview:  Overview:  Followed by Dr. Lacinda Axon  . Cyst of ovary 02/19/2009   Overview:  Ovarian cyst on right stable 2004-2010 Cyst ? larger on CT 2012 Stable on follow up US Followed by Dr. Edwinna Areola Overview:  Overview:  Ovarian cyst on right stable 2004-2010 Cyst ? larger on CT 2012 Stable on follow up US Followed by Dr. Edwinna Areola  . Depression, major, single episode, complete remission ([place]) 07/18/2017  . Diffuse cystic mastopathy of breast 02/19/2013   Overview:  Mother with breast cancer later in life Two breast biopsies both with normal pathology Annual mammograms recommended, last 2016 Followed at the [facility]:  Relevant Hx: Course:  Daily Update: Today's Plan: Overview:  Overview:  Mother with breast cancer later in life Two breast biopsies both with normal pathology Annual mammograms recommended, last 55  . Essential (primary) hypertension 02/19/2013   Overview:  Goal blood pressure < 135/85 On atenolol Overview:  Overview:  Goal blood pressure < 135/85 On atenolol  . Hyperlipidemia 02/19/2013   Overview:  Goal LDL < 130, last value 92 in 2017 On Pravachol Overview:  Overview:  Goal LDL < 130, last value 82 in 2012 On Pravachol  . Hypothyroidism 02/28/2012   Overview:  started on T4  TFT normal 2017 Followed by Dr. Almon Hercules Overview:  Overview:  started on T4  TFT normal 2014 Followed by Dr. Almon Hercules  . Impaired glucose tolerance 02/20/2003   Overview:  Overview:  Glucose goal < 100, last value 94 in 2014 HgbA1c goal < 6.0, last value 5.3 in 2014 Urine microalbumin negative in 2014 On diet and exercise  . Nontoxic multinodular goiter 03/06/2003   Overview:  Asymptomatic nodular thyroid Stable [place] 2004-2014 Followed by Dr. Almon Hercules Overview:  Overview:  Asymptomatic nodular thyroid Stable [place] 2004-2014 Followed by Dr. Almon Hercules  . OSA on CPAP 01/13/2015   Overview:  Sleep study sent to medical records:   Sleep efficiency 22%, latency 24 min, # awakenings 11, res desat N=10, index 17.8  Apnea 1 for 16 sec, hypopnea 16 for 18 sec, total sleep time only 90 min so  index = 11 She did not sleep much and the index puts her in mild category Repeat study a few days later when she slept 187 min her AHI = 4.6 which is normal, average O2 sat 94% I don't think   . Osteoarthritis 02/20/2003   Overview:  Mild bilateral hands Right CMC Right hip Left knee s/p meniscus tear All symptomatically stable Overview:  Overview:  Mild bilateral hands Right CMC Right hip Left knee s/p meniscus tear All symptomatically stable  . Osteoporosis 02/20/2003   Overview:  Bone density 2004 LS -3.32 and RH-1.62 Bone density 2008 LS -3.14 and RH -1.22 Bone density 2009 LS -3.17  and RH -1.21 Bone density 2010 LS -3.14 and RH -1.42  Bone density 2014 LS -2.7 (L3 -3.1) and RH -1.5 Bone density 2017 LS -2.3 and RH -1.4 (neck -1.7) Vitamin D level 38 in 2012 On Boniva 2004 to 2010, restarted 2015 Repeat bone density in 2020 and stop Boniva  Last Assessment & Pl  . Prediabetes 02/20/2003   Overview:  Glucose goal < 100, last value 91 in 2017 HgbA1c goal < 6.0, last value 5.6 in 2017 Urine microalbumin negative in 2017 On diet and exercise  . Recurrent UTI 10/19/2014    Past Surgical History:  Procedure Laterality Date  . ABDOMINAL HYSTERECTOMY  1975  . URETEROSCOPY    . URETEROSCOPY WITH HOLMIUM LASER LITHOTRIPSY      There were no vitals filed for this visit.  Subjective Assessment - 11/28/19 1407    Subjective  Doing ok. Steadiness feels about the same.    Pertinent History  LE weakness, unsteadiness. Symptoms began in the last few months. Gradual onset. Legs feeling shaky and wobbly, especially in the thighs. UE do not feel wobbly. Has not had the problem before. No pain or discomfort. No back pain. No falls in the last 6 months (has fear of falling). Pt currently lives in the [facility] at [place]. Does not use an AD. Has not yet had PT before. Exercises about 35 min a day (stationary bike, elliptical, and the Nustep)    Patient Stated Goals  Improve strength and balance.    Currently in Pain?  No/denies    Pain Score  0-No pain         [facility] PT Assessment - 11/28/19 1408      Observation/Other Assessments   Focus on Therapeutic Outcomes (FOTO)   Upper leg FOTO: 107   Pt needed PT tech assist to answer FOTO questions     Strength   Right Hip Extension  4+/5   seated manually resisted   Right Hip ABduction  4-/5    Left Hip Extension  4/5   seated manually resisted   Left Hip ABduction  3+/5      Berg Balance Test   Sit to Stand  Able to stand without using hands and stabilize independently    Standing Unsupported  Able to stand safely 2 minutes    Sitting  with Back Unsupported but Feet Supported on Floor or Stool  Able to sit safely and securely 2 minutes    Stand to Sit  Sits safely with minimal use of hands    Transfers  Able to transfer safely, minor use of hands    Standing Unsupported with Eyes Closed  Able to stand 10 seconds safely    Standing Unsupported with Feet Together  Able to place feet together independently and stand 1 minute safely    From Standing, Reach  Forward with Outstretched Arm  Can reach forward >12 cm safely (5")    From Standing Position, Pick up Object from [place] to pick up shoe safely and easily    From Standing Position, Turn to Look Behind Over each Shoulder  Looks behind from both sides and weight shifts well    Turn 360 Degrees  Able to turn 360 degrees safely but slowly    Standing Unsupported, Alternately Place Feet on Step/Stool  Able to stand independently and complete 8 steps >20 seconds    Standing Unsupported, One Foot in Front  Able to plae foot ahead of the other independently and hold 30 seconds    Standing on One Leg  Tries to lift leg/unable to hold 3 seconds but remains standing independently   R LE 4 seconds   Total Score  48    Berg comment:  < 46/56 suggests increased risk for falls and need for AD                           PT Education - 11/28/19 1409    Education Details  ther-ex    Person(s) Educated  Patient    Methods  Explanation;Demonstration;Tactile cues;Verbal cues    Comprehension  Returned demonstration;Verbalized understanding      Objective  No latex band allergies  Blood pressure is controlledper pt    L Pelvic drop during R LE stance phase with unsteadiness dizziness when turning to the R in supine   Usually feels more off balance early in the morning when getting out of bed. Feels a little"off kilter". Gets out at the R side of the bed.  (dizziness with supine R rotation.  [name] (-)  bilaterally  MedbridgeAccess Code: [email or web address]    Therapeutic exercise   Directed patient with sit <> stand throughout session Stand pivot transfer chair <> low mat table Static standing shoulder width apart, then with eyes closed, then with eyes open feet together, tandem stance with feet shoulder width apart Picking up an object (1 lb dumbell) from the floor,  Turning 360 degrees to the R and to the L 1x Looking behind to the R and to the L,  Standing forward reach,   Standing alternate toe taps 4 x each LE onto first regular step  Seated manually resisted hip extension, S/L hip abduction 1x each LE for each way  S/L hip abduction  L 5x3 AAROM  R 5x3   Seated clamshell isometrics hips less than 90 degrees flexion              10x3 with 5 second holds   Improved exercise technique, movement at target joints, use of target muscles after mod verbal, visual, tactile cues.   Response to treatment Pt tolerated session well without aggravation of symptoms.   Clinical Impression/pt response:  Pt demonstrates overall improved hip extension and abduction strength as well as improved balance with pt scoring 48/56 in her Berg Balance test today. Decreased FOTO score from 69 to 53 however compared to initial evaluation suggesting decreased function. Pt however needed assistance answering the FOTO questions both times and different people assisted her. Pt will benefit from continued skilled physical therapy services to improve strength, balance, function, and decrease fall risk.    PT Short Term Goals - 11/28/19 1540      PT SHORT TERM GOAL #1   Title  Pt will be independent wiht her HEP to improve strength,  balance, and function.    Time  3    Period  Weeks    Status  On-going    Target Date  11/14/19        PT Long Term Goals - 11/28/19 1540      PT LONG TERM GOAL #1   Title  Patient will improve bilateral hip abduction and extension strength to improve  steadiness during gait and decrease fall risk.    Time  8    Period  Weeks    Status  Partially Met    Target Date  12/19/19      PT LONG TERM GOAL #2   Title  Patient will improve her Berg Balance score to 48/56 as a demonstration of improved balance.    Baseline  42/56 (10/22/2019); 48/56 (11/28/2019)    Time  8    Period  Weeks    Status  Achieved    Target Date  12/19/19            Plan - 11/28/19 1540    Clinical Impression Statement  Pt demonstrates overall improved hip extension and abduction strength as well as improved balance with pt scoring 48/56 in her Berg Balance test today. Decreased FOTO score from 69 to 53 however compared to initial evaluation suggesting decreased function. Pt however needed assistance answering the FOTO questions both times and different people assisted her. Pt will benefit from continued skilled physical therapy services to improve strength, balance, function, and decrease fall risk.    Personal Factors and Comorbidities  Age;Comorbidity 3+;Past/Current Experience;Time since onset of injury/illness/exacerbation;Fitness    Comorbidities  HTN, OA, osteoporosis    Examination-Activity Limitations  Carry;Locomotion Level    Stability/Clinical Decision Making  Stable/Uncomplicated    Clinical Decision Making  Low    Rehab Potential  Fair    PT Frequency  2x / week    PT Duration  8 weeks    PT Treatment/Interventions  Neuromuscular re-education;Therapeutic exercise;Therapeutic activities;Functional mobility training;Balance training;Patient/family education;Manual techniques;Dry needling;Vestibular;Aquatic Therapy;Canalith Repostioning;Electrical Stimulation;Iontophoresis 45m/ml Dexamethasone;Gait training;Stair training    PT Next Visit Plan  glute med, max, strunk strengthening, lumbopelvic control. Manual techniques, modalities PRN    Consulted and Agree with Plan of Care  Patient       Patient will benefit from skilled therapeutic intervention in  order to improve the following deficits and impairments:  Postural dysfunction, Improper body mechanics, Decreased strength, Decreased balance  Visit Diagnosis: Unsteadiness on feet  Muscle weakness (generalized)     Problem List Patient Active Problem List   Diagnosis Date Noted  . Leg weakness 10/13/2019  . Headache 10/10/2019  . Breast tenderness 10/10/2019  . Inversion of right nipple 10/10/2019  . Acid reflux 09/08/2019  . Breast cancer screening 06/08/2019  . Cracked lip 05/25/2019  . SOBOE (shortness of breath on exertion) 05/20/2019  . Nocturia 03/21/2019  . Personal history of kidney stones 03/21/2019  . Depression, major, single episode, complete remission [place] 07/18/2017  . Insomnia secondary to depression with anxiety 02/25/2017  . Adjustment reaction with anxiety and depression 02/26/2016  . Encounter for counseling 12/01/2015  . Microhematuria 03/10/2015  . OSA (obstructive sleep apnea) 01/13/2015  . Nephrolithiasis 12/30/2014  . Recurrent UTI 10/19/2014  . Hyperlipidemia 02/19/2013  . Essential (primary) hypertension 02/19/2013  . Diffuse cystic mastopathy of breast 02/19/2013  . Basal cell carcinoma of skin 02/19/2013  . Hypothyroidism 02/28/2012  . Cyst of ovary 02/19/2009  . Nontoxic multinodular goiter 03/06/2003  . Prediabetes 02/20/2003  .  Osteoporosis 02/20/2003  . Osteoarthritis 02/20/2003  . Impaired glucose tolerance 02/20/2003  . Asymptomatic menopausal state 02/20/1984     Thank you for your referral.   Joneen Boers PT, DPT   11/28/2019, 3:48 PM  [facility] [address] [place], [address] Phone: [phone]   Fax:  [phone]  Name: BRENDA SAMANO MRN: [id number] Date of Birth: August 27, 1926

## 2019-12-03 ENCOUNTER — Other Ambulatory Visit: Payer: Self-pay

## 2019-12-03 ENCOUNTER — Ambulatory Visit: Payer: Medicare HMO

## 2019-12-03 DIAGNOSIS — R2681 Unsteadiness on feet: Secondary | ICD-10-CM

## 2019-12-03 DIAGNOSIS — M6281 Muscle weakness (generalized): Secondary | ICD-10-CM | POA: Diagnosis not present

## 2019-12-03 NOTE — Patient Instructions (Signed)
Access Code: [email or web address] URL: [email or web address] Date: 12/03/2019 Prepared by: Joneen Boers  Exercises Standing Hip Abduction with Counter Support - 1 x daily - 7 x weekly - 10 reps - 3 sets Seated Flexion Stretch - 1 x daily - 7 x weekly - 1 sets - 10 reps - 5 seconds hold Supine Posterior Pelvic Tilt - 1 x daily - 7 x weekly - 3 sets - 10 reps - 5 seconds hold Hooklying Clamshell with Resistance - 1 x daily - 7 x weekly - 2-3 sets - 10 reps

## 2019-12-03 NOTE — Therapy (Signed)
[facility] [address], [place], [address] Phone: [phone]   Fax:  [phone]  Physical Therapy Treatment  Patient Details  Name: Jennifer Bernard MRN: [id number] Date of Birth: Jan 09, 1927 Referring Provider (PT): Einar Pheasant, MD   Encounter Date: 12/03/2019  PT End of Session - 12/03/19 1346    Visit Number  11    Number of Visits  17    Date for PT Re-Evaluation  12/19/19    Authorization Type  1    Authorization Time Period  of 10 progress report    PT Start Time  1346    PT Stop Time  1427    PT Time Calculation (min)  41 min    Activity Tolerance  Patient tolerated treatment well    Behavior During Therapy  [facility] for tasks assessed/performed       Past Medical History:  Diagnosis Date  . Asymptomatic menopausal state 02/20/1984   Overview:  Naturally occuring and asymptomatic Vaginal atrophy present on topical estrogen Overview:  Overview:  Naturally occuring and asymptomatic Vaginal atrophy present on topical estrogen  . Basal cell carcinoma of skin 02/19/2013   Overview:  Basal cell on her nasolabial fold 2014 S/p Mohs by Dr. Lacinda Axon Followed by local dermatologist regularly Overview:  Overview:  Followed by Dr. Lacinda Axon  . Cyst of ovary 02/19/2009   Overview:  Ovarian cyst on right stable 2004-2010 Cyst ? larger on CT 2012 Stable on follow up US Followed by Dr. Edwinna Areola Overview:  Overview:  Ovarian cyst on right stable 2004-2010 Cyst ? larger on CT 2012 Stable on follow up US Followed by Dr. Edwinna Areola  . Depression, major, single episode, complete remission ([place]) 07/18/2017  . Diffuse cystic mastopathy of breast 02/19/2013   Overview:  Mother with breast cancer later in life Two breast biopsies both with normal pathology Annual mammograms recommended, last 2016 Followed at the [facility]:  Relevant Hx: Course: Daily Update: Today's Plan: Overview:  Overview:   Mother with breast cancer later in life Two breast biopsies both with normal pathology Annual mammograms recommended, last 41  . Essential (primary) hypertension 02/19/2013   Overview:  Goal blood pressure < 135/85 On atenolol Overview:  Overview:  Goal blood pressure < 135/85 On atenolol  . Hyperlipidemia 02/19/2013   Overview:  Goal LDL < 130, last value 92 in 2017 On Pravachol Overview:  Overview:  Goal LDL < 130, last value 82 in 2012 On Pravachol  . Hypothyroidism 02/28/2012   Overview:  started on T4  TFT normal 2017 Followed by Dr. Almon Hercules Overview:  Overview:  started on T4  TFT normal 2014 Followed by Dr. Almon Hercules  . Impaired glucose tolerance 02/20/2003   Overview:  Overview:  Glucose goal < 100, last value 94 in 2014 HgbA1c goal < 6.0, last value 5.3 in 2014 Urine microalbumin negative in 2014 On diet and exercise  . Nontoxic multinodular goiter 03/06/2003   Overview:  Asymptomatic nodular thyroid Stable [place] 2004-2014 Followed by Dr. Almon Hercules Overview:  Overview:  Asymptomatic nodular thyroid Stable [place] 2004-2014 Followed by Dr. Almon Hercules  . OSA on CPAP 01/13/2015   Overview:  Sleep study sent to medical records:   Sleep efficiency 22%, latency 24 min, # awakenings 11, res desat N=10, index 17.8  Apnea 1 for 16 sec, hypopnea 16 for 18 sec, total sleep time only 90 min so index = 11 She did not  sleep much and the index puts her in mild category Repeat study a few days later when she slept 187 min her AHI = 4.6 which is normal, average O2 sat 94% I don't think   . Osteoarthritis 02/20/2003   Overview:  Mild bilateral hands Right CMC Right hip Left knee s/p meniscus tear All symptomatically stable Overview:  Overview:  Mild bilateral hands Right CMC Right hip Left knee s/p meniscus tear All symptomatically stable  . Osteoporosis 02/20/2003   Overview:  Bone density 2004 LS -3.32 and RH-1.62 Bone density 2008 LS -3.14 and RH -1.22 Bone density 2009 LS -3.17 and RH -1.21 Bone density 2010 LS -3.14 and RH  -1.42  Bone density 2014 LS -2.7 (L3 -3.1) and RH -1.5 Bone density 2017 LS -2.3 and RH -1.4 (neck -1.7) Vitamin D level 38 in 2012 On Boniva 2004 to 2010, restarted 2015 Repeat bone density in 2020 and stop Boniva  Last Assessment & Pl  . Prediabetes 02/20/2003   Overview:  Glucose goal < 100, last value 91 in 2017 HgbA1c goal < 6.0, last value 5.6 in 2017 Urine microalbumin negative in 2017 On diet and exercise  . Recurrent UTI 10/19/2014    Past Surgical History:  Procedure Laterality Date  . ABDOMINAL HYSTERECTOMY  1975  . URETEROSCOPY    . URETEROSCOPY WITH HOLMIUM LASER LITHOTRIPSY      There were no vitals filed for this visit.  Subjective Assessment - 12/03/19 1348    Subjective  Doing ok, no pain or discomfort.    Pertinent History  LE weakness, unsteadiness. Symptoms began in the last few months. Gradual onset. Legs feeling shaky and wobbly, especially in the thighs. UE do not feel wobbly. Has not had the problem before. No pain or discomfort. No back pain. No falls in the last 6 months (has fear of falling). Pt currently lives in the [facility] at [place]. Does not use an AD. Has not yet had PT before. Exercises about 35 min a day (stationary bike, elliptical, and the Nustep)    Patient Stated Goals  Improve strength and balance.    Currently in Pain?  No/denies    Pain Score  0-No pain                               PT Education - 12/03/19 1402    Education Details  ther-ex, HEP    Person(s) Educated  Patient    Methods  Explanation;Demonstration;Tactile cues;Verbal cues;Handout    Comprehension  Returned demonstration;Verbalized understanding      Objective  No latex band allergies  Blood pressure is controlledper pt    L Pelvic drop during R LE stance phase with unsteadiness dizziness when turning to the R in supine   Usually feels more off balance early in the morning when getting out of bed. Feels a little"off kilter". Gets out at  the R side of the bed.  (dizziness with supine R rotation.  [name] (-) bilaterally  MedbridgeAccess Code: [email or web address]    Therapeutic exercise  S/L hip abduction   L with PT assist 10x3  R 10x3  Supine posterior pelvic tilt 10x3 with 5 seconds   Supine clamshell red band 5x2  Then yellow band 10x2  Reviewed today's HEP addition. Pt demonstrated and verbalized understanding. Handout provided.    Standing B shoulder extension yellow band standing on Air Ex pad 10x, then 5x to promote trunk strength and  LE stability/balance. CGA   Forward step up onto Air Ex pad with CGA to min A from PT, no UE assist   R 5x3  L 5x3  Difficulty with L LE secondary to glute med weakness. Improved ability to perform with less PT assist during second and third sets.     Improved exercise technique, movement at target joints, use of target muscles after mod verbal, visual, tactile cues.   Response to treatment Good muscle use felt with exercises. Pt tolerated session well without complain of pain.   Clinical Impression/pt response: Continued working on glute med and trunk strengthening to decrease pelvic drop and lateral lean and improve steadiness during gait. Worked on obstacle negotiation such as stepping up onto uneven surface without UE assist. Difficulty with L LE at first secondary to glute weakness but improved ability to perform more steadily with increased repetition and practice. Pt tolerated session well without aggravation of symptoms. Pt will benefit from continued skilled physical therapy services to improve strength, balance, and function.             PT Short Term Goals - 11/28/19 1540      PT SHORT TERM GOAL #1   Title  Pt will be independent wiht her HEP to improve strength, balance, and function.    Time  3    Period  Weeks    Status  On-going    Target Date  11/14/19        PT Long Term Goals - 11/28/19 1540      PT LONG TERM GOAL  #1   Title  Patient will improve bilateral hip abduction and extension strength to improve steadiness during gait and decrease fall risk.    Time  8    Period  Weeks    Status  Partially Met    Target Date  12/19/19      PT LONG TERM GOAL #2   Title  Patient will improve her Berg Balance score to 48/56 as a demonstration of improved balance.    Baseline  42/56 (10/22/2019); 48/56 (11/28/2019)    Time  8    Period  Weeks    Status  Achieved    Target Date  12/19/19            Plan - 12/03/19 1537    Clinical Impression Statement  Continued working on glute med and trunk strengthening to decrease pelvic drop and lateral lean and improve steadiness during gait. Worked on obstacle negotiation such as stepping up onto uneven surface without UE assist. Difficulty with L LE at first secondary to glute weakness but improved ability to perform more steadily with increased repetition and practice. Pt tolerated session well without aggravation of symptoms. Pt will benefit from continued skilled physical therapy services to improve strength, balance, and function.    Personal Factors and Comorbidities  Age;Comorbidity 3+;Past/Current Experience;Time since onset of injury/illness/exacerbation;Fitness    Comorbidities  HTN, OA, osteoporosis    Examination-Activity Limitations  Carry;Locomotion Level    Stability/Clinical Decision Making  Stable/Uncomplicated    Rehab Potential  Fair    PT Frequency  2x / week    PT Duration  8 weeks    PT Treatment/Interventions  Neuromuscular re-education;Therapeutic exercise;Therapeutic activities;Functional mobility training;Balance training;Patient/family education;Manual techniques;Dry needling;Vestibular;Aquatic Therapy;Canalith Repostioning;Electrical Stimulation;Iontophoresis 25m/ml Dexamethasone;Gait training;Stair training    PT Next Visit Plan  glute med, max, strunk strengthening, lumbopelvic control. Manual techniques, modalities PRN    Consulted and  Agree with Plan of Care  Patient       Patient will benefit from skilled therapeutic intervention in order to improve the following deficits and impairments:  Postural dysfunction, Improper body mechanics, Decreased strength, Decreased balance  Visit Diagnosis: Unsteadiness on feet  Muscle weakness (generalized)     Problem List Patient Active Problem List   Diagnosis Date Noted  . Leg weakness 10/13/2019  . Headache 10/10/2019  . Breast tenderness 10/10/2019  . Inversion of right nipple 10/10/2019  . Acid reflux 09/08/2019  . Breast cancer screening 06/08/2019  . Cracked lip 05/25/2019  . SOBOE (shortness of breath on exertion) 05/20/2019  . Nocturia 03/21/2019  . Personal history of kidney stones 03/21/2019  . Depression, major, single episode, complete remission ([place]) 07/18/2017  . Insomnia secondary to depression with anxiety 02/25/2017  . Adjustment reaction with anxiety and depression 02/26/2016  . Encounter for counseling 12/01/2015  . Microhematuria 03/10/2015  . OSA (obstructive sleep apnea) 01/13/2015  . Nephrolithiasis 12/30/2014  . Recurrent UTI 10/19/2014  . Hyperlipidemia 02/19/2013  . Essential (primary) hypertension 02/19/2013  . Diffuse cystic mastopathy of breast 02/19/2013  . Basal cell carcinoma of skin 02/19/2013  . Hypothyroidism 02/28/2012  . Cyst of ovary 02/19/2009  . Nontoxic multinodular goiter 03/06/2003  . Prediabetes 02/20/2003  . Osteoporosis 02/20/2003  . Osteoarthritis 02/20/2003  . Impaired glucose tolerance 02/20/2003  . Asymptomatic menopausal state 02/20/1984    Joneen Boers PT, DPT   12/03/2019, 3:46 PM  [facility] [address] [place], [address] Phone: [phone]   Fax:  [phone]  Name: Jennifer Bernard MRN: [id number] Date of Birth: 1927-05-15

## 2019-12-05 ENCOUNTER — Ambulatory Visit: Payer: Medicare HMO

## 2019-12-05 ENCOUNTER — Other Ambulatory Visit: Payer: Self-pay

## 2019-12-05 DIAGNOSIS — R2681 Unsteadiness on feet: Secondary | ICD-10-CM

## 2019-12-05 DIAGNOSIS — M6281 Muscle weakness (generalized): Secondary | ICD-10-CM

## 2019-12-05 NOTE — Therapy (Signed)
[facility] [address], [place], [address] Phone: [phone]   Fax:  [phone]  Physical Therapy Treatment  Patient Details  Name: Jennifer Bernard MRN: [id number] Date of Birth: July 02, 1927 Referring Provider (PT): Einar Pheasant, MD   Encounter Date: 12/05/2019  PT End of Session - 12/05/19 1404    Visit Number  12    Number of Visits  17    Date for PT Re-Evaluation  12/19/19    Authorization Type  2    Authorization Time Period  of 10 progress report    PT Start Time  1404    PT Stop Time  1447    PT Time Calculation (min)  43 min    Activity Tolerance  Patient tolerated treatment well    Behavior During Therapy  [facility] for tasks assessed/performed       Past Medical History:  Diagnosis Date  . Asymptomatic menopausal state 02/20/1984   Overview:  Naturally occuring and asymptomatic Vaginal atrophy present on topical estrogen Overview:  Overview:  Naturally occuring and asymptomatic Vaginal atrophy present on topical estrogen  . Basal cell carcinoma of skin 02/19/2013   Overview:  Basal cell on her nasolabial fold 2014 S/p Mohs by Dr. Lacinda Axon Followed by local dermatologist regularly Overview:  Overview:  Followed by Dr. Lacinda Axon  . Cyst of ovary 02/19/2009   Overview:  Ovarian cyst on right stable 2004-2010 Cyst ? larger on CT 2012 Stable on follow up US Followed by Dr. Edwinna Areola Overview:  Overview:  Ovarian cyst on right stable 2004-2010 Cyst ? larger on CT 2012 Stable on follow up US Followed by Dr. Edwinna Areola  . Depression, major, single episode, complete remission ([place]) 07/18/2017  . Diffuse cystic mastopathy of breast 02/19/2013   Overview:  Mother with breast cancer later in life Two breast biopsies both with normal pathology Annual mammograms recommended, last 2016 Followed at the [facility]:  Relevant Hx: Course: Daily Update: Today's Plan: Overview:  Overview:   Mother with breast cancer later in life Two breast biopsies both with normal pathology Annual mammograms recommended, last 36  . Essential (primary) hypertension 02/19/2013   Overview:  Goal blood pressure < 135/85 On atenolol Overview:  Overview:  Goal blood pressure < 135/85 On atenolol  . Hyperlipidemia 02/19/2013   Overview:  Goal LDL < 130, last value 92 in 2017 On Pravachol Overview:  Overview:  Goal LDL < 130, last value 82 in 2012 On Pravachol  . Hypothyroidism 02/28/2012   Overview:  started on T4  TFT normal 2017 Followed by Dr. Almon Hercules Overview:  Overview:  started on T4  TFT normal 2014 Followed by Dr. Almon Hercules  . Impaired glucose tolerance 02/20/2003   Overview:  Overview:  Glucose goal < 100, last value 94 in 2014 HgbA1c goal < 6.0, last value 5.3 in 2014 Urine microalbumin negative in 2014 On diet and exercise  . Nontoxic multinodular goiter 03/06/2003   Overview:  Asymptomatic nodular thyroid Stable [place] 2004-2014 Followed by Dr. Almon Hercules Overview:  Overview:  Asymptomatic nodular thyroid Stable [place] 2004-2014 Followed by Dr. Almon Hercules  . OSA on CPAP 01/13/2015   Overview:  Sleep study sent to medical records:   Sleep efficiency 22%, latency 24 min, # awakenings 11, res desat N=10, index 17.8  Apnea 1 for 16 sec, hypopnea 16 for 18 sec, total sleep time only 90 min so index = 11 She did not  sleep much and the index puts her in mild category Repeat study a few days later when she slept 187 min her AHI = 4.6 which is normal, average O2 sat 94% I don't think   . Osteoarthritis 02/20/2003   Overview:  Mild bilateral hands Right CMC Right hip Left knee s/p meniscus tear All symptomatically stable Overview:  Overview:  Mild bilateral hands Right CMC Right hip Left knee s/p meniscus tear All symptomatically stable  . Osteoporosis 02/20/2003   Overview:  Bone density 2004 LS -3.32 and RH-1.62 Bone density 2008 LS -3.14 and RH -1.22 Bone density 2009 LS -3.17 and RH -1.21 Bone density 2010 LS -3.14 and RH  -1.42  Bone density 2014 LS -2.7 (L3 -3.1) and RH -1.5 Bone density 2017 LS -2.3 and RH -1.4 (neck -1.7) Vitamin D level 38 in 2012 On Boniva 2004 to 2010, restarted 2015 Repeat bone density in 2020 and stop Boniva  Last Assessment & Pl  . Prediabetes 02/20/2003   Overview:  Glucose goal < 100, last value 91 in 2017 HgbA1c goal < 6.0, last value 5.6 in 2017 Urine microalbumin negative in 2017 On diet and exercise  . Recurrent UTI 10/19/2014    Past Surgical History:  Procedure Laterality Date  . ABDOMINAL HYSTERECTOMY  1975  . URETEROSCOPY    . URETEROSCOPY WITH HOLMIUM LASER LITHOTRIPSY      There were no vitals filed for this visit.  Subjective Assessment - 12/05/19 1405    Subjective  Doing pretty good. No pain currently.    Pertinent History  LE weakness, unsteadiness. Symptoms began in the last few months. Gradual onset. Legs feeling shaky and wobbly, especially in the thighs. UE do not feel wobbly. Has not had the problem before. No pain or discomfort. No back pain. No falls in the last 6 months (has fear of falling). Pt currently lives in the [facility] at [place]. Does not use an AD. Has not yet had PT before. Exercises about 35 min a day (stationary bike, elliptical, and the Nustep)    Patient Stated Goals  Improve strength and balance.    Currently in Pain?  No/denies    Pain Score  0-No pain                               PT Education - 12/05/19 1416    Education Details  ther-ex    Person(s) Educated  Patient    Methods  Explanation;Demonstration;Tactile cues;Verbal cues    Comprehension  Returned demonstration;Verbalized understanding      Objective  No latex band allergies  Blood pressure is controlledper pt    L Pelvic drop during R LE stance phase with unsteadiness dizziness when turning to the R in supine   Usually feels more off balance early in the morning when getting out of bed. Feels a little"off kilter". Gets out at the R  side of the bed.  (dizziness with supine R rotation.  [name] (-) bilaterally  MedbridgeAccess Code: [email or web address]    Therapeutic exercise  S/L clamshell   L 10x  R 10x  S/L hip abduction              L with PT assist (during first set of 10) 10x3. Stronger today observed compared to previous visit             R 10x3  Supine posterior pelvic tilt 10x3 with 5 seconds   Mat  table to chair transfer 1x. Dizziness when standing up  Seated B ankle pumps to promote musculovenous pump and blood flow 30x  Sitting in upright posture  Manual trunk perturbation by PT 1 minute x 2 to promote core strength  Stepping over 4 mini hurdles CGA 4x, alternating LE. No LOB   Improved steadiness.   SLS  R multiple times. Able to maintain standing balance 11 seconds  L multiple times. Able to maintain standing balance up to 5 seconds    Pt assist and cues to place and maintain center of gravity over base of support.   Standing tandem stance, feet about 10-12 inches apart  R foot in front 30 seconds x 2  L foot in front 30 seconds x 2   Then tandem stance, feet only 3-4 inches apart   R foot in front 30 seconds   L foot in front 30 seconds    Cues to maintain center of gravity between base of support. Able to perform herself without LOB. CGA   Improved exercise technique, movement at target joints, use of target muscles after mod verbal, visual, tactile cues.     Response to treatment Good muscle use felt with exercises.Pt tolerated session well without complain of pain.  Clinical Impression/pt response: Improving ability to negotiate obstacles as well as place and maintain her center of gravity over her base of support with decreasing width without LOB. Continued working on improving glute med and trunk strength to improve steadiness during single leg stance phase of gait and while negotiating obstacles. Pt tolerated session well without aggravation of  symptoms. Pt will benefit from continued skilled physical therapy services to improve strength, balance and function.       PT Short Term Goals - 11/28/19 1540      PT SHORT TERM GOAL #1   Title  Pt will be independent wiht her HEP to improve strength, balance, and function.    Time  3    Period  Weeks    Status  On-going    Target Date  11/14/19        PT Long Term Goals - 11/28/19 1540      PT LONG TERM GOAL #1   Title  Patient will improve bilateral hip abduction and extension strength to improve steadiness during gait and decrease fall risk.    Time  8    Period  Weeks    Status  Partially Met    Target Date  12/19/19      PT LONG TERM GOAL #2   Title  Patient will improve her Berg Balance score to 48/56 as a demonstration of improved balance.    Baseline  42/56 (10/22/2019); 48/56 (11/28/2019)    Time  8    Period  Weeks    Status  Achieved    Target Date  12/19/19            Plan - 12/05/19 1418    Clinical Impression Statement  Improving ability to negotiate obstacles as well as place and maintain her center of gravity over her base of support with decreasing width without LOB. Continued working on improving glute med and trunk strength to improve steadiness during single leg stance phase of gait and while negotiating obstacles. Pt tolerated session well without aggravation of symptoms. Pt will benefit from continued skilled physical therapy services to improve strength, balance and function.    Personal Factors and Comorbidities  Age;Comorbidity 3+;Past/Current Experience;Time since onset of injury/illness/exacerbation;Fitness  Comorbidities  HTN, OA, osteoporosis    Examination-Activity Limitations  Carry;Locomotion Level    Stability/Clinical Decision Making  Stable/Uncomplicated    Rehab Potential  Fair    PT Frequency  2x / week    PT Duration  8 weeks    PT Treatment/Interventions  Neuromuscular re-education;Therapeutic exercise;Therapeutic  activities;Functional mobility training;Balance training;Patient/family education;Manual techniques;Dry needling;Vestibular;Aquatic Therapy;Canalith Repostioning;Electrical Stimulation;Iontophoresis 5m/ml Dexamethasone;Gait training;Stair training    PT Next Visit Plan  glute med, max, strunk strengthening, lumbopelvic control. Manual techniques, modalities PRN    Consulted and Agree with Plan of Care  Patient       Patient will benefit from skilled therapeutic intervention in order to improve the following deficits and impairments:  Postural dysfunction, Improper body mechanics, Decreased strength, Decreased balance  Visit Diagnosis: Unsteadiness on feet  Muscle weakness (generalized)     Problem List Patient Active Problem List   Diagnosis Date Noted  . Leg weakness 10/13/2019  . Headache 10/10/2019  . Breast tenderness 10/10/2019  . Inversion of right nipple 10/10/2019  . Acid reflux 09/08/2019  . Breast cancer screening 06/08/2019  . Cracked lip 05/25/2019  . SOBOE (shortness of breath on exertion) 05/20/2019  . Nocturia 03/21/2019  . Personal history of kidney stones 03/21/2019  . Depression, major, single episode, complete remission ([place] 07/18/2017  . Insomnia secondary to depression with anxiety 02/25/2017  . Adjustment reaction with anxiety and depression 02/26/2016  . Encounter for counseling 12/01/2015  . Microhematuria 03/10/2015  . OSA (obstructive sleep apnea) 01/13/2015  . Nephrolithiasis 12/30/2014  . Recurrent UTI 10/19/2014  . Hyperlipidemia 02/19/2013  . Essential (primary) hypertension 02/19/2013  . Diffuse cystic mastopathy of breast 02/19/2013  . Basal cell carcinoma of skin 02/19/2013  . Hypothyroidism 02/28/2012  . Cyst of ovary 02/19/2009  . Nontoxic multinodular goiter 03/06/2003  . Prediabetes 02/20/2003  . Osteoporosis 02/20/2003  . Osteoarthritis 02/20/2003  . Impaired glucose tolerance 02/20/2003  . Asymptomatic menopausal state  02/20/1984    MJoneen BoersPT, DPT   12/05/2019, 3:01 PM  [facility] [address] [place] [address]Phone: [phone]  Fax:  [phone] Name: Jennifer VELOSOMRN: [id number] of Birth: [birth year]

## 2019-12-10 ENCOUNTER — Other Ambulatory Visit: Payer: Self-pay

## 2019-12-10 ENCOUNTER — Ambulatory Visit: Payer: Medicare HMO

## 2019-12-10 DIAGNOSIS — M6281 Muscle weakness (generalized): Secondary | ICD-10-CM | POA: Diagnosis not present

## 2019-12-10 DIAGNOSIS — R2681 Unsteadiness on feet: Secondary | ICD-10-CM

## 2019-12-10 NOTE — Therapy (Signed)
[facility] [address], [place], [address] Phone: [phone]   Fax:  [phone]  Physical Therapy Treatment  Patient Details  Name: Jennifer Bernard MRN: [id number] Date of Birth: [birth year] Referring Provider (PT): Einar Pheasant, MD   Encounter Date: 12/10/2019  PT End of Session - 12/10/19 1350    Visit Number  13    Number of Visits  17    Date for PT Re-Evaluation  12/19/19    Authorization Type  3    Authorization Time Period  of 10 progress report    PT Start Time  1351    PT Stop Time  1432    PT Time Calculation (min)  41 min    Activity Tolerance  Patient tolerated treatment well    Behavior During Therapy  [facility] for tasks assessed/performed       Past Medical History:  Diagnosis Date  . Asymptomatic menopausal state 02/20/1984   Overview:  Naturally occuring and asymptomatic Vaginal atrophy present on topical estrogen Overview:  Overview:  Naturally occuring and asymptomatic Vaginal atrophy present on topical estrogen  . Basal cell carcinoma of skin 02/19/2013   Overview:  Basal cell on her nasolabial fold 2014 S/p Mohs by Dr. Lacinda Axon Followed by local dermatologist regularly Overview:  Overview:  Followed by Dr. Lacinda Axon  . Cyst of ovary 02/19/2009   Overview:  Ovarian cyst on right stable 2004-2010 Cyst ? larger on CT 2012 Stable on follow up US Followed by Dr. Edwinna Areola Overview:  Overview:  Ovarian cyst on right stable 2004-2010 Cyst ? larger on CT 2012 Stable on follow up US Followed by Dr. Edwinna Areola  . Depression, major, single episode, complete remission ([place]) 07/18/2017  . Diffuse cystic mastopathy of breast 02/19/2013   Overview:  Mother with breast cancer later in life Two breast biopsies both with normal pathology Annual mammograms recommended, last 2016 Followed at the [place]:  Relevant Hx: Course: Daily Update: Today's Plan: Overview:  Overview:   Mother with breast cancer later in life Two breast biopsies both with normal pathology Annual mammograms recommended, last 21  . Essential (primary) hypertension 02/19/2013   Overview:  Goal blood pressure < 135/85 On atenolol Overview:  Overview:  Goal blood pressure < 135/85 On atenolol  . Hyperlipidemia 02/19/2013   Overview:  Goal LDL < 130, last value 92 in 2017 On Pravachol Overview:  Overview:  Goal LDL < 130, last value 82 in 2012 On Pravachol  . Hypothyroidism 02/28/2012   Overview:  started on T4  TFT normal 2017 Followed by Dr. Almon Hercules Overview:  Overview:  started on T4  TFT normal 2014 Followed by Dr. Almon Hercules  . Impaired glucose tolerance 02/20/2003   Overview:  Overview:  Glucose goal < 100, last value 94 in 2014 HgbA1c goal < 6.0, last value 5.3 in 2014 Urine microalbumin negative in 2014 On diet and exercise  . Nontoxic multinodular goiter 03/06/2003   Overview:  Asymptomatic nodular thyroid Stable [place] 2004-2014 Followed by Dr. Almon Hercules Overview:  Overview:  Asymptomatic nodular thyroid Stable [place] 2004-2014 Followed by Dr. Almon Hercules  . OSA on CPAP 01/13/2015   Overview:  Sleep study sent to medical records:   Sleep efficiency 22%, latency 24 min, # awakenings 11, res desat N=10, index 17.8  Apnea 1 for 16 sec, hypopnea 16 for 18 sec, total sleep time only 90 min so index = 11 She did not  sleep much and the index puts her in mild category Repeat study a few days later when she slept 187 min her AHI = 4.6 which is normal, average O2 sat 94% I don't think   . Osteoarthritis 02/20/2003   Overview:  Mild bilateral hands Right CMC Right hip Left knee s/p meniscus tear All symptomatically stable Overview:  Overview:  Mild bilateral hands Right CMC Right hip Left knee s/p meniscus tear All symptomatically stable  . Osteoporosis 02/20/2003   Overview:  Bone density 2004 LS -3.32 and RH-1.62 Bone density 2008 LS -3.14 and RH -1.22 Bone density 2009 LS -3.17 and RH -1.21 Bone density 2010 LS -3.14 and RH  -1.42  Bone density 2014 LS -2.7 (L3 -3.1) and RH -1.5 Bone density 2017 LS -2.3 and RH -1.4 (neck -1.7) Vitamin D level 38 in 2012 On Boniva 2004 to 2010, restarted 2015 Repeat bone density in 2020 and stop Boniva  Last Assessment & Pl  . Prediabetes 02/20/2003   Overview:  Glucose goal < 100, last value 91 in 2017 HgbA1c goal < 6.0, last value 5.6 in 2017 Urine microalbumin negative in 2017 On diet and exercise  . Recurrent UTI 10/19/2014    Past Surgical History:  Procedure Laterality Date  . ABDOMINAL HYSTERECTOMY  1975  . URETEROSCOPY    . URETEROSCOPY WITH HOLMIUM LASER LITHOTRIPSY      There were no vitals filed for this visit.  Subjective Assessment - 12/10/19 1353    Subjective  Has been running a lot of errands all morning and feels tired. Feels bilateral lateral hip (TFL muscle area) soreness, 3/10 currently when walking.  Walking is about the same.  Some days, her balance confidence is good, some days its not so good.    Pertinent History  LE weakness, unsteadiness. Symptoms began in the last few months. Gradual onset. Legs feeling shaky and wobbly, especially in the thighs. UE do not feel wobbly. Has not had the problem before. No pain or discomfort. No back pain. No falls in the last 6 months (has fear of falling). Pt currently lives in the [place] at [place]. Does not use an AD. Has not yet had PT before. Exercises about 35 min a day (stationary bike, elliptical, and the Nustep)    Patient Stated Goals  Improve strength and balance.    Currently in Pain?  Yes    Pain Score  3                                PT Education - 12/10/19 1923    Education Details  ther-ex    Person(s) Educated  Patient    Methods  Explanation;Demonstration;Tactile cues;Verbal cues    Comprehension  Returned demonstration;Verbalized understanding      Objective  No latex band allergies  Blood pressure is controlledper pt    L Pelvic drop during R LE stance  phase with unsteadiness dizziness when turning to the R in supine   Usually feels more off balance early in the morning when getting out of bed. Feels a little"off kilter". Gets out at the R side of the bed.  (dizziness with supine R rotation.  [name] (-) bilaterally  MedbridgeAccess Code: [email or web address]    Therapeutic exercise  S/L clamshell              L 10x2             R 10x2  Reviewed and given as part of her HEP. Pt demonstrated and verbalized understanding.    S/L hip abduction  L with PT assist (during first set of 10) 10x3. R 10x3    Supine posterior pelvic tilt 10x3 with 5 seconds  Stepping over 4 mini hurdles CGA 8x, alternating LE.  LOB at times today. Improved balance after cues to place and maintain her COG over her BOS (foot)   SLS             R multiple times.               L multiple times.               Pt assist and cues to place and maintain center of gravity over base of support for L LE. Able to maintain SLS balance on R LE at least 9 seconds today      Improved exercise technique, movement at target joints, use of target muscles after mod verbal, visual, tactile cues.     Response to treatment Good muscle use felt with exercises.Pt tolerated session well without aggravation of symptoms  Clinical Impression/pt response: Continued working on glute med and trunk muscle strengthening to promote ability to ambulate and negotiate obstacles with less trunk lean. Difficulty stepping over obstacles today with LOB at times but improved with cues to place and maintain her center of gravity over her base of support (foot). Pt tolerated session well without aggravation of symptoms. Pt will benefit from continued skilled physical therapy services to improve strength, balance, and function.     PT Short Term Goals - 11/28/19 1540      PT SHORT TERM GOAL #1   Title  Pt will be independent wiht her  HEP to improve strength, balance, and function.    Time  3    Period  Weeks    Status  On-going    Target Date  11/14/19        PT Long Term Goals - 11/28/19 1540      PT LONG TERM GOAL #1   Title  Patient will improve bilateral hip abduction and extension strength to improve steadiness during gait and decrease fall risk.    Time  8    Period  Weeks    Status  Partially Met    Target Date  12/19/19      PT LONG TERM GOAL #2   Title  Patient will improve her Berg Balance score to 48/56 as a demonstration of improved balance.    Baseline  42/56 (10/22/2019); 48/56 (11/28/2019)    Time  8    Period  Weeks    Status  Achieved    Target Date  12/19/19            Plan - 12/10/19 1923    Clinical Impression Statement  Continued working on glute med and trunk muscle strengthening to promote ability to ambulate and negotiate obstacles with less trunk lean. Difficulty stepping over obstacles today with LOB at times but improved with cues to place and maintain her center of gravity over her base of support (foot). Pt tolerated session well without aggravation of symptoms. Pt will benefit from continued skilled physical therapy services to improve strength, balance, and function.    Personal Factors and Comorbidities  Age;Comorbidity 3+;Past/Current Experience;Time since onset of injury/illness/exacerbation;Fitness    Comorbidities  HTN, OA, osteoporosis    Examination-Activity Limitations  Carry;Locomotion Level    Stability/Clinical Decision Making  Stable/Uncomplicated  Rehab Potential  Fair    PT Frequency  2x / week    PT Duration  8 weeks    PT Treatment/Interventions  Neuromuscular re-education;Therapeutic exercise;Therapeutic activities;Functional mobility training;Balance training;Patient/family education;Manual techniques;Dry needling;Vestibular;Aquatic Therapy;Canalith Repostioning;Electrical Stimulation;Iontophoresis 79m/ml Dexamethasone;Gait training;Stair training    PT  Next Visit Plan  glute med, max, strunk strengthening, lumbopelvic control. Manual techniques, modalities PRN    Consulted and Agree with Plan of Care  Patient       Patient will benefit from skilled therapeutic intervention in order to improve the following deficits and impairments:  Postural dysfunction, Improper body mechanics, Decreased strength, Decreased balance  Visit Diagnosis: Unsteadiness on feet  Muscle weakness (generalized)     Problem List Patient Active Problem List   Diagnosis Date Noted  . Leg weakness 10/13/2019  . Headache 10/10/2019  . Breast tenderness 10/10/2019  . Inversion of right nipple 10/10/2019  . Acid reflux 09/08/2019  . Breast cancer screening 06/08/2019  . Cracked lip 05/25/2019  . SOBOE (shortness of breath on exertion) 05/20/2019  . Nocturia 03/21/2019  . Personal history of kidney stones 03/21/2019  . Depression, major, single episode, complete remission ([place] 07/18/2017  . Insomnia secondary to depression with anxiety 02/25/2017  . Adjustment reaction with anxiety and depression 02/26/2016  . Encounter for counseling 12/01/2015  . Microhematuria 03/10/2015  . OSA (obstructive sleep apnea) 01/13/2015  . Nephrolithiasis 12/30/2014  . Recurrent UTI 10/19/2014  . Hyperlipidemia 02/19/2013  . Essential (primary) hypertension 02/19/2013  . Diffuse cystic mastopathy of breast 02/19/2013  . Basal cell carcinoma of skin 02/19/2013  . Hypothyroidism 02/28/2012  . Cyst of ovary 02/19/2009  . Nontoxic multinodular goiter 03/06/2003  . Prediabetes 02/20/2003  . Osteoporosis 02/20/2003  . Osteoarthritis 02/20/2003  . Impaired glucose tolerance 02/20/2003  . Asymptomatic menopausal state 02/20/1984    MJoneen BoersPT, DPT   12/10/2019, 7:31 PM  [place] [facility] [address] [place] [address]Phone: [phone]  Fax:  [phone] Name: ECHANIECE BARBATOMRN:  [id number] of Birth: 825-Jul-1928

## 2019-12-12 ENCOUNTER — Other Ambulatory Visit: Payer: Self-pay

## 2019-12-12 ENCOUNTER — Ambulatory Visit: Payer: Medicare HMO

## 2019-12-12 DIAGNOSIS — M6281 Muscle weakness (generalized): Secondary | ICD-10-CM | POA: Diagnosis not present

## 2019-12-12 DIAGNOSIS — R2681 Unsteadiness on feet: Secondary | ICD-10-CM | POA: Diagnosis not present

## 2019-12-12 NOTE — Therapy (Signed)
[facility] [address], [place], [address] Phone: [phone]   Fax:  [phone]  Physical Therapy Treatment  Patient Details  Name: Jennifer Bernard MRN: [id number] Date of Birth: 1927-02-09 Referring Provider (PT): Einar Pheasant, MD   Encounter Date: 12/12/2019  PT End of Session - 12/12/19 1405    Visit Number  14    Number of Visits  17    Date for PT Re-Evaluation  12/19/19    Authorization Type  4    Authorization Time Period  of 10 progress report    PT Start Time  1400    PT Stop Time  1445    PT Time Calculation (min)  45 min    Activity Tolerance  Patient tolerated treatment well    Behavior During Therapy  [facility] for tasks assessed/performed       Past Medical History:  Diagnosis Date  . Asymptomatic menopausal state 02/20/1984   Overview:  Naturally occuring and asymptomatic Vaginal atrophy present on topical estrogen Overview:  Overview:  Naturally occuring and asymptomatic Vaginal atrophy present on topical estrogen  . Basal cell carcinoma of skin 02/19/2013   Overview:  Basal cell on her nasolabial fold 2014 S/p Mohs by Dr. Lacinda Axon Followed by local dermatologist regularly Overview:  Overview:  Followed by Dr. Lacinda Axon  . Cyst of ovary 02/19/2009   Overview:  Ovarian cyst on right stable 2004-2010 Cyst ? larger on CT 2012 Stable on follow up US Followed by Dr. Edwinna Areola Overview:  Overview:  Ovarian cyst on right stable 2004-2010 Cyst ? larger on CT 2012 Stable on follow up US Followed by Dr. Edwinna Areola  . Depression, major, single episode, complete remission ([place]) 07/18/2017  . Diffuse cystic mastopathy of breast 02/19/2013   Overview:  Mother with breast cancer later in life Two breast biopsies both with normal pathology Annual mammograms recommended, last 2016 Followed at the [place]:  Relevant Hx: Course: Daily Update: Today's Plan: Overview:  Overview:   Mother with breast cancer later in life Two breast biopsies both with normal pathology Annual mammograms recommended, last 60  . Essential (primary) hypertension 02/19/2013   Overview:  Goal blood pressure < 135/85 On atenolol Overview:  Overview:  Goal blood pressure < 135/85 On atenolol  . Hyperlipidemia 02/19/2013   Overview:  Goal LDL < 130, last value 92 in 2017 On Pravachol Overview:  Overview:  Goal LDL < 130, last value 82 in 2012 On Pravachol  . Hypothyroidism 02/28/2012   Overview:  started on T4  TFT normal 2017 Followed by Dr. Almon Hercules Overview:  Overview:  started on T4  TFT normal 2014 Followed by Dr. Almon Hercules  . Impaired glucose tolerance 02/20/2003   Overview:  Overview:  Glucose goal < 100, last value 94 in 2014 HgbA1c goal < 6.0, last value 5.3 in 2014 Urine microalbumin negative in 2014 On diet and exercise  . Nontoxic multinodular goiter 03/06/2003   Overview:  Asymptomatic nodular thyroid Stable [place] 2004-2014 Followed by Dr. Almon Hercules Overview:  Overview:  Asymptomatic nodular thyroid Stable [place] 2004-2014 Followed by Dr. Almon Hercules  . OSA on CPAP 01/13/2015   Overview:  Sleep study sent to medical records:   Sleep efficiency 22%, latency 24 min, # awakenings 11, res desat N=10, index 17.8  Apnea 1 for 16 sec, hypopnea 16 for 18 sec, total sleep time only 90 min so index = 11 She did not  sleep much and the index puts her in mild category Repeat study a few days later when she slept 187 min her AHI = 4.6 which is normal, average O2 sat 94% I don't think   . Osteoarthritis 02/20/2003   Overview:  Mild bilateral hands Right CMC Right hip Left knee s/p meniscus tear All symptomatically stable Overview:  Overview:  Mild bilateral hands Right CMC Right hip Left knee s/p meniscus tear All symptomatically stable  . Osteoporosis 02/20/2003   Overview:  Bone density 2004 LS -3.32 and RH-1.62 Bone density 2008 LS -3.14 and RH -1.22 Bone density 2009 LS -3.17 and RH -1.21 Bone density 2010 LS -3.14 and RH  -1.42  Bone density 2014 LS -2.7 (L3 -3.1) and RH -1.5 Bone density 2017 LS -2.3 and RH -1.4 (neck -1.7) Vitamin D level 38 in 2012 On Boniva 2004 to 2010, restarted 2015 Repeat bone density in 2020 and stop Boniva  Last Assessment & Pl  . Prediabetes 02/20/2003   Overview:  Glucose goal < 100, last value 91 in 2017 HgbA1c goal < 6.0, last value 5.6 in 2017 Urine microalbumin negative in 2017 On diet and exercise  . Recurrent UTI 10/19/2014    Past Surgical History:  Procedure Laterality Date  . ABDOMINAL HYSTERECTOMY  1975  . URETEROSCOPY    . URETEROSCOPY WITH HOLMIUM LASER LITHOTRIPSY      There were no vitals filed for this visit.  Subjective Assessment - 12/12/19 1402    Subjective  Patient reported that she's been "piddling" today, nothing exciting. Pt reported more muscle soreness thant pain after last PT session.    Pertinent History  LE weakness, unsteadiness. Symptoms began in the last few months. Gradual onset. Legs feeling shaky and wobbly, especially in the thighs. UE do not feel wobbly. Has not had the problem before. No pain or discomfort. No back pain. No falls in the last 6 months (has fear of falling). Pt currently lives in the [place] at [place]. Does not use an AD. Has not yet had PT before. Exercises about 35 min a day (stationary bike, elliptical, and the Nustep)    Patient Stated Goals  Improve strength and balance.    Currently in Pain?  No/denies         Objective    No latex band allergies    Blood pressure is controlled per pt   L Pelvic drop during R LE stance phase with unsteadiness dizziness when turning to the R in supine    Usually feels more off balance early in the morning when getting out of bed. Feels a little "off kilter". Gets out at the R side of the bed.              (dizziness with supine R rotation. ) [name] (-) bilaterally    Medbridge Access Code: [email or web address]     Therapeutic exercise   S/L clamshell              L 10x2              R 10x2   Bridges 2x10 with cueing to keep pelvis level, and to squeeze glutes   S/L hip abduction              L with tactile cues 10x2             R with tactile cues10x2  TFL stretch in obers position on LLE 3x30sec hold  Piriformis stretch in supine x30sec ea side  with PT assist   Supine posterior pelvic tilt 10x3 with 5 seconds   Stepping over 4 mini hurdles CGA 8x, alternating LE.  LOB at times today. Improved balance after cues to place and maintain her COG over her BOS (foot)  Stepping over 4 mini hurdles, laterally 4x, multiple LOB, min-modA to correct. Cueing for technique.   SLS             R multiple times. (3-5reps)               L multiple times. (3-5 reps)               Pt assist and cues to place and maintain center of gravity over base of support for L LE. Cued to practice at home with 1-2 finger support   Improved exercise technique, movement at target joints, use of target muscles after mod verbal, visual, tactile cues.        Pt response/clinical impression:  The patient was able to perform exercises without aggravation of pain, stated TFL stretch "felt good". Session focused on promoting glute strength, improve stability. Pt challenged by single limb activities; needed min-modA to correct LOBs without dynamic balance activities. The patient would benefit from further skilled PT intervention to continue to progress towards goals.     PT Education - 12/12/19 1405    Education Details  therex    Person(s) Educated  Patient    Methods  Explanation;Demonstration;Tactile cues;Verbal cues    Comprehension  Verbalized understanding;Returned demonstration;Verbal cues required       PT Short Term Goals - 11/28/19 1540      PT SHORT TERM GOAL #1   Title  Pt will be independent wiht her HEP to improve strength, balance, and function.    Time  3    Period  Weeks    Status  On-going    Target Date  11/14/19        PT Long Term Goals - 11/28/19 1540       PT LONG TERM GOAL #1   Title  Patient will improve bilateral hip abduction and extension strength to improve steadiness during gait and decrease fall risk.    Time  8    Period  Weeks    Status  Partially Met    Target Date  12/19/19      PT LONG TERM GOAL #2   Title  Patient will improve her Berg Balance score to 48/56 as a demonstration of improved balance.    Baseline  42/56 (10/22/2019); 48/56 (11/28/2019)    Time  8    Period  Weeks    Status  Achieved    Target Date  12/19/19            Plan - 12/12/19 1532    Clinical Impression Statement  The patient was able to perform exercises without aggrevation of pain, stated TFL stretch "felt good". Session focused on promoting glute strength, improve stability. Pt challenged by single limb activities; needed min-modA to correct LOBs without dynamic balance activities. The patient would benefit from further skilled PT intervention to continue to progress towards goals.    Personal Factors and Comorbidities  Age;Comorbidity 3+;Past/Current Experience;Time since onset of injury/illness/exacerbation;Fitness    Comorbidities  HTN, OA, osteoporosis    Examination-Activity Limitations  Carry;Locomotion Level    Stability/Clinical Decision Making  Stable/Uncomplicated    Rehab Potential  Fair    PT Frequency  2x / week    PT Duration  8 weeks    PT Treatment/Interventions  Neuromuscular re-education;Therapeutic exercise;Therapeutic activities;Functional mobility training;Balance training;Patient/family education;Manual techniques;Dry needling;Vestibular;Aquatic Therapy;Canalith Repostioning;Electrical Stimulation;Iontophoresis 41m/ml Dexamethasone;Gait training;Stair training    PT Next Visit Plan  glute med, max, strunk strengthening, lumbopelvic control. Manual techniques, modalities PRN    Consulted and Agree with Plan of Care  Patient       Patient will benefit from skilled therapeutic intervention in order to improve the following  deficits and impairments:  Postural dysfunction, Improper body mechanics, Decreased strength, Decreased balance  Visit Diagnosis: Unsteadiness on feet  Muscle weakness (generalized)     Problem List Patient Active Problem List   Diagnosis Date Noted  . Leg weakness 10/13/2019  . Headache 10/10/2019  . Breast tenderness 10/10/2019  . Inversion of right nipple 10/10/2019  . Acid reflux 09/08/2019  . Breast cancer screening 06/08/2019  . Cracked lip 05/25/2019  . SOBOE (shortness of breath on exertion) 05/20/2019  . Nocturia 03/21/2019  . Personal history of kidney stones 03/21/2019  . Depression, major, single episode, complete remission ([place] 07/18/2017  . Insomnia secondary to depression with anxiety 02/25/2017  . Adjustment reaction with anxiety and depression 02/26/2016  . Encounter for counseling 12/01/2015  . Microhematuria 03/10/2015  . OSA (obstructive sleep apnea) 01/13/2015  . Nephrolithiasis 12/30/2014  . Recurrent UTI 10/19/2014  . Hyperlipidemia 02/19/2013  . Essential (primary) hypertension 02/19/2013  . Diffuse cystic mastopathy of breast 02/19/2013  . Basal cell carcinoma of skin 02/19/2013  . Hypothyroidism 02/28/2012  . Cyst of ovary 02/19/2009  . Nontoxic multinodular goiter 03/06/2003  . Prediabetes 02/20/2003  . Osteoporosis 02/20/2003  . Osteoarthritis 02/20/2003  . Impaired glucose tolerance 02/20/2003  . Asymptomatic menopausal state 02/20/1984    DLieutenant DiegoPT, DPT 3:37 PM,12/12/19   [facility] [address] [place] [address]Phone: [phone]  Fax:  [phone] Name: Jennifer PIERATTMRN: [id number] of Birth: [birth year]

## 2019-12-16 ENCOUNTER — Ambulatory Visit: Payer: Medicare HMO

## 2019-12-18 ENCOUNTER — Other Ambulatory Visit: Payer: Self-pay

## 2019-12-18 ENCOUNTER — Ambulatory Visit: Payer: Medicare HMO | Attending: Internal Medicine

## 2019-12-18 DIAGNOSIS — R2681 Unsteadiness on feet: Secondary | ICD-10-CM | POA: Insufficient documentation

## 2019-12-18 DIAGNOSIS — M6281 Muscle weakness (generalized): Secondary | ICD-10-CM | POA: Diagnosis not present

## 2019-12-18 NOTE — Therapy (Signed)
[facility] [address], [place], [address] Phone: [phone]   Fax:  [phone]  Physical Therapy Treatment  Patient Details  Name: Jennifer Bernard MRN: [id number] Date of Birth: May 18, 1927 Referring Provider (PT): Einar Pheasant, MD   Encounter Date: 12/18/2019  PT End of Session - 12/18/19 0849    Visit Number  15    Number of Visits  29    Date for PT Re-Evaluation  01/30/20    Authorization Type  5    Authorization Time Period  of 10 progress report    PT Start Time  0850    PT Stop Time  0930    PT Time Calculation (min)  40 min    Activity Tolerance  Patient tolerated treatment well    Behavior During Therapy  [facility] for tasks assessed/performed       Past Medical History:  Diagnosis Date  . Asymptomatic menopausal state 02/20/1984   Overview:  Naturally occuring and asymptomatic Vaginal atrophy present on topical estrogen Overview:  Overview:  Naturally occuring and asymptomatic Vaginal atrophy present on topical estrogen  . Basal cell carcinoma of skin 02/19/2013   Overview:  Basal cell on her nasolabial fold 2014 S/p Mohs by Dr. Lacinda Axon Followed by local dermatologist regularly Overview:  Overview:  Followed by Dr. Lacinda Axon  . Cyst of ovary 02/19/2009   Overview:  Ovarian cyst on right stable 2004-2010 Cyst ? larger on CT 2012 Stable on follow up US Followed by Dr. Edwinna Areola Overview:  Overview:  Ovarian cyst on right stable 2004-2010 Cyst ? larger on CT 2012 Stable on follow up US Followed by Dr. Edwinna Areola  . Depression, major, single episode, complete remission ([place]) 07/18/2017  . Diffuse cystic mastopathy of breast 02/19/2013   Overview:  Mother with breast cancer later in life Two breast biopsies both with normal pathology Annual mammograms recommended, last 2016 Followed at the [facility]:  Relevant Hx: Course: Daily Update: Today's Plan: Overview:  Overview:   Mother with breast cancer later in life Two breast biopsies both with normal pathology Annual mammograms recommended, last 58  . Essential (primary) hypertension 02/19/2013   Overview:  Goal blood pressure < 135/85 On atenolol Overview:  Overview:  Goal blood pressure < 135/85 On atenolol  . Hyperlipidemia 02/19/2013   Overview:  Goal LDL < 130, last value 92 in 2017 On Pravachol Overview:  Overview:  Goal LDL < 130, last value 82 in 2012 On Pravachol  . Hypothyroidism 02/28/2012   Overview:  started on T4  TFT normal 2017 Followed by Dr. Almon Hercules Overview:  Overview:  started on T4  TFT normal 2014 Followed by Dr. Almon Hercules  . Impaired glucose tolerance 02/20/2003   Overview:  Overview:  Glucose goal < 100, last value 94 in 2014 HgbA1c goal < 6.0, last value 5.3 in 2014 Urine microalbumin negative in 2014 On diet and exercise  . Nontoxic multinodular goiter 03/06/2003   Overview:  Asymptomatic nodular thyroid Stable [place] 2004-2014 Followed by Dr. Almon Hercules Overview:  Overview:  Asymptomatic nodular thyroid Stable [place] 2004-2014 Followed by Dr. Almon Hercules  . OSA on CPAP 01/13/2015   Overview:  Sleep study sent to medical records:   Sleep efficiency 22%, latency 24 min, # awakenings 11, res desat N=10, index 17.8  Apnea 1 for 16 sec, hypopnea 16 for 18 sec, total sleep time only 90 min so index = 11 She did not  sleep much and the index puts her in mild category Repeat study a few days later when she slept 187 min her AHI = 4.6 which is normal, average O2 sat 94% I don't think   . Osteoarthritis 02/20/2003   Overview:  Mild bilateral hands Right CMC Right hip Left knee s/p meniscus tear All symptomatically stable Overview:  Overview:  Mild bilateral hands Right CMC Right hip Left knee s/p meniscus tear All symptomatically stable  . Osteoporosis 02/20/2003   Overview:  Bone density 2004 LS -3.32 and RH-1.62 Bone density 2008 LS -3.14 and RH -1.22 Bone density 2009 LS -3.17 and RH -1.21 Bone density 2010 LS -3.14 and RH  -1.42  Bone density 2014 LS -2.7 (L3 -3.1) and RH -1.5 Bone density 2017 LS -2.3 and RH -1.4 (neck -1.7) Vitamin D level 38 in 2012 On Boniva 2004 to 2010, restarted 2015 Repeat bone density in 2020 and stop Boniva  Last Assessment & Pl  . Prediabetes 02/20/2003   Overview:  Glucose goal < 100, last value 91 in 2017 HgbA1c goal < 6.0, last value 5.6 in 2017 Urine microalbumin negative in 2017 On diet and exercise  . Recurrent UTI 10/19/2014    Past Surgical History:  Procedure Laterality Date  . ABDOMINAL HYSTERECTOMY  1975  . URETEROSCOPY    . URETEROSCOPY WITH HOLMIUM LASER LITHOTRIPSY      There were no vitals filed for this visit.  Subjective Assessment - 12/18/19 0851    Subjective  No pain currently, just feels muscle soreness R lateral thigh. Balance feels ok unless she stands on one foot. Frequency of unsteadiness when she first gets up in the morning is less.    Pertinent History  LE weakness, unsteadiness. Symptoms began in the last few months. Gradual onset. Legs feeling shaky and wobbly, especially in the thighs. UE do not feel wobbly. Has not had the problem before. No pain or discomfort. No back pain. No falls in the last 6 months (has fear of falling). Pt currently lives in the [place] at [place]. Does not use an AD. Has not yet had PT before. Exercises about 35 min a day (stationary bike, elliptical, and the Nustep)    Patient Stated Goals  Improve strength and balance.    Currently in Pain?  No/denies    Pain Score  0-No pain         [facility] PT Assessment - 12/18/19 0853      Strength   Right Hip Extension  4+/5   seated manually resisted   Right Hip ABduction  4/5    Left Hip Extension  4/5   seated manually resisted   Left Hip ABduction  3+/5      Dynamic Gait Index   Level Surface  Mild Impairment   Deviates to the R slightly   Change in Gait Speed  Mild Impairment    Gait with Horizontal Head Turns  Moderate Impairment    Gait with Vertical Head Turns   Moderate Impairment    Gait and Pivot Turn  Mild Impairment    Step Over Obstacle  Severe Impairment    Step Around Obstacles  Moderate Impairment    Steps  Mild Impairment    Total Score  11    DGI comment:  < 19 suggests increased fall risk                           PT Education - 12/18/19  [place]    Education Details  ther-ex    Person(s) Educated  Patient    Methods  Explanation;Demonstration;Tactile cues;Verbal cues    Comprehension  Returned demonstration;Verbalized understanding       Objective  No latex band allergies  Blood pressure is controlledper pt  L Pelvic drop during R LE stance phase with unsteadiness dizziness when turning to the R in supine   Usually feels more off balance early in the morning when getting out of bed. Feels a little"off kilter". Gets out at the R side of the bed.  (dizziness with supine R rotation. ) [name] (-) bilaterally  MedbridgeAccess Code: [email or web address]   Therapeutic exercise  Seated manually resisted hip extension, S/L hip abduction 1-2x each LE   Reviewed progress/current status with hip strength with pt. Improved strength except R hip abduction  S/L hip abduction  L with tactile cues10x2 R with tactile cues10x2  S/L clamshell  L 10x2 R 10x2   Directed patient with gait with normal gait speed, with changes in speed, 180 degree pivot turn, with R and L cervical rotation position, with cervical flexion and extension position, stepping around obstacles, stepping over an obstacle, ascending and descending 4 regular steps with UE assist    Reviewed plan of care continue 2x/week for 6 weeks   SLS R multiple times. (3-5reps)  L multiple times. (3-5 reps)   Improved exercise technique, movement at target joints, use of target muscles after mod verbal, visual, tactile cues.       Clinical  Impression/pt response:  Pt demonstrates improved overall hip strength, and Berg balance score suggesting decreased fall risk and decreased need for AD. Pt however demonstrates low DGI score suggesting increased need for balance training especially with gait to decrease fall risk and would benefit from continued skilled physical therapy services to continue to improve strength, balance, function, and decrease fall risk.        PT Short Term Goals - 11/28/19 1540      PT SHORT TERM GOAL #1   Title  Pt will be independent wiht her HEP to improve strength, balance, and function.    Time  3    Period  Weeks    Status  On-going    Target Date  11/14/19        PT Long Term Goals - 12/18/19 0929      PT LONG TERM GOAL #1   Title  Patient will improve bilateral hip abduction and extension strength to improve steadiness during gait and decrease fall risk.    Time  6    Period  Weeks    Status  Partially Met    Target Date  01/30/20      PT LONG TERM GOAL #2   Title  Patient will improve her Berg Balance score to 48/56 as a demonstration of improved balance.    Baseline  42/56 (10/22/2019); 48/56 (11/28/2019)    Time  8    Period  Weeks    Status  Achieved    Target Date  12/19/19      PT LONG TERM GOAL #3   Title  Pt will improve DGI score to at least 19 points as a demonstration of improved balance with gait.    Baseline  DGI 11 (12/18/2019)    Time  6    Period  Weeks    Status  New    Target Date  01/30/20  Plan - 12/18/19 1826    Clinical Impression Statement  Pt demonstrates improved overall hip strength, and Berg balance score suggesting decreased fall risk and decreased need for AD. Pt however demonstrates low DGI score suggesting increased need for balance training especially with gait to decrease fall risk and would benefit from continued skilled physical therapy services to continue to improve strength, balance, function, and decrease fall risk.     Personal Factors and Comorbidities  Age;Comorbidity 3+;Past/Current Experience;Time since onset of injury/illness/exacerbation;Fitness    Comorbidities  HTN, OA, osteoporosis    Examination-Activity Limitations  Carry;Locomotion Level    Stability/Clinical Decision Making  Stable/Uncomplicated    Clinical Decision Making  Low    Rehab Potential  Fair    PT Frequency  2x / week    PT Duration  6 weeks    PT Treatment/Interventions  Neuromuscular re-education;Therapeutic exercise;Therapeutic activities;Functional mobility training;Balance training;Patient/family education;Manual techniques;Dry needling;Vestibular;Aquatic Therapy;Canalith Repostioning;Electrical Stimulation;Iontophoresis 80m/ml Dexamethasone;Gait training;Stair training    PT Next Visit Plan  glute med, max, strunk strengthening, lumbopelvic control. Manual techniques, modalities PRN    Consulted and Agree with Plan of Care  Patient       Patient will benefit from skilled therapeutic intervention in order to improve the following deficits and impairments:  Postural dysfunction, Improper body mechanics, Decreased strength, Decreased balance  Visit Diagnosis: Unsteadiness on feet - Plan: PT plan of care cert/re-cert  Muscle weakness (generalized) - Plan: PT plan of care cert/re-cert     Problem List Patient Active Problem List   Diagnosis Date Noted  . Leg weakness 10/13/2019  . Headache 10/10/2019  . Breast tenderness 10/10/2019  . Inversion of right nipple 10/10/2019  . Acid reflux 09/08/2019  . Breast cancer screening 06/08/2019  . Cracked lip 05/25/2019  . SOBOE (shortness of breath on exertion) 05/20/2019  . Nocturia 03/21/2019  . Personal history of kidney stones 03/21/2019  . Depression, major, [place], [place] 07/18/2017  . Insomnia secondary to depression with anxiety 02/25/2017  . Adjustment reaction with anxiety and depression 02/26/2016  . Encounter for counseling 12/01/2015  .  Microhematuria 03/10/2015  . OSA (obstructive sleep apnea) 01/13/2015  . Nephrolithiasis 12/30/2014  . Recurrent UTI 10/19/2014  . Hyperlipidemia 02/19/2013  . Essential (primary) hypertension 02/19/2013  . Diffuse cystic mastopathy of breast 02/19/2013  . Basal cell carcinoma of skin 02/19/2013  . Hypothyroidism 02/28/2012  . Cyst of ovary 02/19/2009  . Nontoxic multinodular goiter 03/06/2003  . Prediabetes 02/20/2003  . Osteoporosis 02/20/2003  . Osteoarthritis 02/20/2003  . Impaired glucose tolerance 02/20/2003  . Asymptomatic menopausal state 02/20/1984    MJoneen BoersPT, DPT   12/18/2019, 6:37 PM  [facility] [address] [place] [id number]: [phone]  Fax:  [phone] Name: Jennifer DILLOWMRN: [id number] of Birth: [birth year]

## 2019-12-23 ENCOUNTER — Ambulatory Visit: Payer: Medicare HMO

## 2019-12-23 ENCOUNTER — Other Ambulatory Visit: Payer: Self-pay

## 2019-12-23 DIAGNOSIS — R2681 Unsteadiness on feet: Secondary | ICD-10-CM

## 2019-12-23 DIAGNOSIS — M6281 Muscle weakness (generalized): Secondary | ICD-10-CM

## 2019-12-23 NOTE — Therapy (Signed)
[facility] [address], [place], [address] Phone: [phone]   Fax:  [phone]  Physical Therapy Treatment  Patient Details  Name: Jennifer Bernard MRN: [id number] Date of Birth: Feb 19, 1927 Referring Provider (PT): Einar Pheasant, MD   Encounter Date: 12/23/2019  PT End of Session - 12/23/19 1126    Visit Number  16    Number of Visits  29    Date for PT Re-Evaluation  01/30/20    Authorization Type  6    Authorization Time Period  of 10 progress report    PT Start Time  1126    PT Stop Time  1157    PT Time Calculation (min)  31 min    Activity Tolerance  Patient tolerated treatment well    Behavior During Therapy  [facility] for tasks assessed/performed       Past Medical History:  Diagnosis Date  . Asymptomatic menopausal state 02/20/1984   Overview:  Naturally occuring and asymptomatic Vaginal atrophy present on topical estrogen Overview:  Overview:  Naturally occuring and asymptomatic Vaginal atrophy present on topical estrogen  . Basal cell carcinoma of skin 02/19/2013   Overview:  Basal cell on her nasolabial fold 2014 S/p Mohs by Dr. Lacinda Axon Followed by local dermatologist regularly Overview:  Overview:  Followed by Dr. Lacinda Axon  . Cyst of ovary 02/19/2009   Overview:  Ovarian cyst on right stable 2004-2010 Cyst ? larger on CT 2012 Stable on follow up US Followed by Dr. Edwinna Areola Overview:  Overview:  Ovarian cyst on right stable 2004-2010 Cyst ? larger on CT 2012 Stable on follow up US Followed by Dr. Edwinna Areola  . Depression, major, single episode, complete remission ([place]) 07/18/2017  . Diffuse cystic mastopathy of breast 02/19/2013   Overview:  Mother with breast cancer later in life Two breast biopsies both with normal pathology Annual mammograms recommended, last 2016 Followed at the [facility]:  Relevant Hx: Course: Daily Update: Today's Plan: Overview:  Overview:   Mother with breast cancer later in life Two breast biopsies both with normal pathology Annual mammograms recommended, last 42  . Essential (primary) hypertension 02/19/2013   Overview:  Goal blood pressure < 135/85 On atenolol Overview:  Overview:  Goal blood pressure < 135/85 On atenolol  . Hyperlipidemia 02/19/2013   Overview:  Goal LDL < 130, last value 92 in 2017 On Pravachol Overview:  Overview:  Goal LDL < 130, last value 82 in 2012 On Pravachol  . Hypothyroidism 02/28/2012   Overview:  started on T4  TFT normal 2017 Followed by Dr. Almon Hercules Overview:  Overview:  started on T4  TFT normal 2014 Followed by Dr. Almon Hercules  . Impaired glucose tolerance 02/20/2003   Overview:  Overview:  Glucose goal < 100, last value 94 in 2014 HgbA1c goal < 6.0, last value 5.3 in 2014 Urine microalbumin negative in 2014 On diet and exercise  . Nontoxic multinodular goiter 03/06/2003   Overview:  Asymptomatic nodular thyroid Stable [place] 2004-2014 Followed by Dr. Almon Hercules Overview:  Overview:  Asymptomatic nodular thyroid Stable [place] 2004-2014 Followed by Dr. Almon Hercules  . OSA on CPAP 01/13/2015   Overview:  Sleep study sent to medical records:   Sleep efficiency 22%, latency 24 min, # awakenings 11, res desat N=10, index 17.8  Apnea 1 for 16 sec, hypopnea 16 for 18 sec, total sleep time only 90 min so index = 11 She did not  sleep much and the index puts her in mild category Repeat study a few days later when she slept 187 min her AHI = 4.6 which is normal, average O2 sat 94% I don't think   . Osteoarthritis 02/20/2003   Overview:  Mild bilateral hands Right CMC Right hip Left knee s/p meniscus tear All symptomatically stable Overview:  Overview:  Mild bilateral hands Right CMC Right hip Left knee s/p meniscus tear All symptomatically stable  . Osteoporosis 02/20/2003   Overview:  Bone density 2004 LS -3.32 and RH-1.62 Bone density 2008 LS -3.14 and RH -1.22 Bone density 2009 LS -3.17 and RH -1.21 Bone density 2010 LS -3.14 and RH  -1.42  Bone density 2014 LS -2.7 (L3 -3.1) and RH -1.5 Bone density 2017 LS -2.3 and RH -1.4 (neck -1.7) Vitamin D level 38 in 2012 On Boniva 2004 to 2010, restarted 2015 Repeat bone density in 2020 and stop Boniva  Last Assessment & Pl  . Prediabetes 02/20/2003   Overview:  Glucose goal < 100, last value 91 in 2017 HgbA1c goal < 6.0, last value 5.6 in 2017 Urine microalbumin negative in 2017 On diet and exercise  . Recurrent UTI 10/19/2014    Past Surgical History:  Procedure Laterality Date  . ABDOMINAL HYSTERECTOMY  1975  . URETEROSCOPY    . URETEROSCOPY WITH HOLMIUM LASER LITHOTRIPSY      There were no vitals filed for this visit.  Subjective Assessment - 12/23/19 1128    Subjective  No pain currently.    Pertinent History  LE weakness, unsteadiness. Symptoms began in the last few months. Gradual onset. Legs feeling shaky and wobbly, especially in the thighs. UE do not feel wobbly. Has not had the problem before. No pain or discomfort. No back pain. No falls in the last 6 months (has fear of falling). Pt currently lives in the [place] at [place]. Does not use an AD. Has not yet had PT before. Exercises about 35 min a day (stationary bike, elliptical, and the Nustep)    Patient Stated Goals  Improve strength and balance.    Currently in Pain?  No/denies                               PT Education - 12/23/19 1130    Education Details  ther-ex    Person(s) Educated  Patient    Methods  Explanation;Demonstration;Tactile cues;Verbal cues    Comprehension  Returned demonstration;Verbalized understanding        Objective  No latex band allergies  Blood pressure is controlledper pt  L Pelvic drop during R LE stance phase with unsteadiness dizziness when turning to the R in supine   Usually feels more off balance early in the morning when getting out of bed. Feels a little"off kilter". Gets out at the R side of the bed.  (dizziness with  supine R rotation.) [name] (-) bilaterally  MedbridgeAccess Code: [email or web address]   Therapeutic exercise  S/L hip abduction  L with PT assist 10x3 Rwith tactile cues10x3  S/L clamshell  L 10x2 R 10x2  Gait with R and L head turns 32 ft x 4. Slight decrease in gait deviations observed afte rmanual therapy  seated chin tucks with scapular retraction 10x5 seconds       Improved exercise technique, movement at target joints, use of target muscles after mod verbal, visual, tactile cues.    Manual therapy Seated STM L and  R rhomboid muscles and R UT muscles to decrease tension    Pt response/clinical impression:   Worked on decreasing neck stiffness and improving ability to turn her head to help promote gait stability in turning her head when walking. Continued working on hip abduction strength to decrease lateral lean during stance phase of gait. Pt tolerated session well without aggravation of symptoms. Pt will benefit from continued skilled physical therapy services to improve strength, balance, and function.      PT Short Term Goals - 11/28/19 1540      PT SHORT TERM GOAL #1   Title  Pt will be independent wiht her HEP to improve strength, balance, and function.    Time  3    Period  Weeks    Status  On-going    Target Date  11/14/19        PT Long Term Goals - 12/18/19 0929      PT LONG TERM GOAL #1   Title  Patient will improve bilateral hip abduction and extension strength to improve steadiness during gait and decrease fall risk.    Time  6    Period  Weeks    Status  Partially Met    Target Date  01/30/20      PT LONG TERM GOAL #2   Title  Patient will improve her Berg Balance score to 48/56 as a demonstration of improved balance.    Baseline  42/56 (10/22/2019); 48/56 (11/28/2019)    Time  8    Period  Weeks    Status  Achieved    Target Date  12/19/19      PT LONG TERM GOAL #3   Title  Pt  will improve DGI score to at least 19 points as a demonstration of improved balance with gait.    Baseline  DGI 11 (12/18/2019)    Time  6    Period  Weeks    Status  New    Target Date  01/30/20            Plan - 12/23/19 1624    Clinical Impression Statement  Worked on decreasing neck stiffness and improving ability to turn her head to help promote gait stability in turning her head when walking. Continued working on hip abduction strength to decrease lateral lean during stance phase of gait. Pt tolerated session well without aggravation of symptoms. Pt will benefit from continued skilled physical therapy services to improve strength, balance, and function.    Personal Factors and Comorbidities  Age;Comorbidity 3+;Past/Current Experience;Time since onset of injury/illness/exacerbation;Fitness    Comorbidities  HTN, OA, osteoporosis    Examination-Activity Limitations  Carry;Locomotion Level    Stability/Clinical Decision Making  Stable/Uncomplicated    Rehab Potential  Fair    PT Frequency  2x / week    PT Duration  6 weeks    PT Treatment/Interventions  Neuromuscular re-education;Therapeutic exercise;Therapeutic activities;Functional mobility training;Balance training;Patient/family education;Manual techniques;Dry needling;Vestibular;Aquatic Therapy;Canalith Repostioning;Electrical Stimulation;Iontophoresis 26m/ml Dexamethasone;Gait training;Stair training    PT Next Visit Plan  glute med, max, strunk strengthening, lumbopelvic control. Manual techniques, modalities PRN    Consulted and Agree with Plan of Care  Patient       Patient will benefit from skilled therapeutic intervention in order to improve the following deficits and impairments:  Postural dysfunction, Improper body mechanics, Decreased strength, Decreased balance  Visit Diagnosis: Unsteadiness on feet  Muscle weakness (generalized)     Problem List Patient Active Problem List   Diagnosis Date  Noted  . Leg  weakness 10/13/2019  . Headache 10/10/2019  . Breast tenderness 10/10/2019  . Inversion of right nipple 10/10/2019  . Acid reflux 09/08/2019  . Breast cancer screening 06/08/2019  . Cracked lip 05/25/2019  . SOBOE (shortness of breath on exertion) 05/20/2019  . Nocturia 03/21/2019  . Personal history of kidney stones 03/21/2019  . Depression, major, single episode, complete remission ([place]) 07/18/2017  . Insomnia secondary to depression with anxiety 02/25/2017  . Adjustment reaction with anxiety and depression 02/26/2016  . Encounter for counseling 12/01/2015  . Microhematuria 03/10/2015  . OSA (obstructive sleep apnea) 01/13/2015  . Nephrolithiasis 12/30/2014  . Recurrent UTI 10/19/2014  . Hyperlipidemia 02/19/2013  . Essential (primary) hypertension 02/19/2013  . Diffuse cystic mastopathy of breast 02/19/2013  . Basal cell carcinoma of skin 02/19/2013  . Hypothyroidism 02/28/2012  . Cyst of ovary 02/19/2009  . Nontoxic multinodular goiter 03/06/2003  . Prediabetes 02/20/2003  . Osteoporosis 02/20/2003  . Osteoarthritis 02/20/2003  . Impaired glucose tolerance 02/20/2003  . Asymptomatic menopausal state 02/20/1984    Joneen Boers PT, DPT   12/23/2019, 4:29 PM  [place] [facility] [address], [place], [address] Phone: [phone]   Fax:  [phone]  Name: ADALEE KATHAN MRN: [id number] Date of Birth: 31-Jan-1927

## 2019-12-25 ENCOUNTER — Other Ambulatory Visit: Payer: Self-pay

## 2019-12-25 ENCOUNTER — Ambulatory Visit: Payer: Medicare HMO

## 2019-12-25 DIAGNOSIS — M6281 Muscle weakness (generalized): Secondary | ICD-10-CM | POA: Diagnosis not present

## 2019-12-25 DIAGNOSIS — R2681 Unsteadiness on feet: Secondary | ICD-10-CM

## 2019-12-25 NOTE — Therapy (Signed)
[facility] [address] [place], [address] Phone: [phone]   Fax:  [phone]  Physical Therapy Treatment  Patient Details  Name: Jennifer Bernard MRN: [id number] Date of Birth: 1926-08-25 Referring Provider (PT): Einar Pheasant, MD   Encounter Date: 12/25/2019  PT End of Session - 12/25/19 1741    Visit Number  17    Number of Visits  29    Date for PT Re-Evaluation  01/30/20    Authorization Type  7    Authorization Time Period  of 10 progress report    PT Start Time  1741    PT Stop Time  1827    PT Time Calculation (min)  46 min    Activity Tolerance  Patient tolerated treatment well    Behavior During Therapy  [facility] for tasks assessed/performed       Past Medical History:  Diagnosis Date  . Asymptomatic menopausal state 02/20/1984   Overview:  Naturally occuring and asymptomatic Vaginal atrophy present on topical estrogen Overview:  Overview:  Naturally occuring and asymptomatic Vaginal atrophy present on topical estrogen  . Basal cell carcinoma of skin 02/19/2013   Overview:  Basal cell on her nasolabial fold 2014 S/p Mohs by Dr. Lacinda Axon Followed by local dermatologist regularly Overview:  Overview:  Followed by Dr. Lacinda Axon  . Cyst of ovary 02/19/2009   Overview:  Ovarian cyst on right stable 2004-2010 Cyst ? larger on CT 2012 Stable on follow up US Followed by Dr. Edwinna Areola Overview:  Overview:  Ovarian cyst on right stable 2004-2010 Cyst ? larger on CT 2012 Stable on follow up US Followed by Dr. Edwinna Areola  . Depression, major, single episode, complete remission ([place]) 07/18/2017  . Diffuse cystic mastopathy of breast 02/19/2013   Overview:  Mother with breast cancer later in life Two breast biopsies both with normal pathology Annual mammograms recommended, last 2016 Followed at the [facility]:  Relevant Hx: Course: Daily Update: Today's Plan: Overview:  Overview:   Mother with breast cancer later in life Two breast biopsies both with normal pathology Annual mammograms recommended, last 44  . Essential (primary) hypertension 02/19/2013   Overview:  Goal blood pressure < 135/85 On atenolol Overview:  Overview:  Goal blood pressure < 135/85 On atenolol  . Hyperlipidemia 02/19/2013   Overview:  Goal LDL < 130, last value 92 in 2017 On Pravachol Overview:  Overview:  Goal LDL < 130, last value 82 in 2012 On Pravachol  . Hypothyroidism 02/28/2012   Overview:  started on T4  TFT normal 2017 Followed by Dr. Almon Hercules Overview:  Overview:  started on T4  TFT normal 2014 Followed by Dr. Almon Hercules  . Impaired glucose tolerance 02/20/2003   Overview:  Overview:  Glucose goal < 100, last value 94 in 2014 HgbA1c goal < 6.0, last value 5.3 in 2014 Urine microalbumin negative in 2014 On diet and exercise  . Nontoxic multinodular goiter 03/06/2003   Overview:  Asymptomatic nodular thyroid Stable [place] 2004-2014 Followed by Dr. Almon Hercules Overview:  Overview:  Asymptomatic nodular thyroid Stable [place] 2004-2014 Followed by Dr. Almon Hercules  . OSA on CPAP 01/13/2015   Overview:  Sleep study sent to medical records:   Sleep efficiency 22%, latency 24 min, # awakenings 11, res desat N=10, index 17.8  Apnea 1 for 16 sec, hypopnea 16 for 18 sec, total sleep time only 90 min so index = 11 She did not  sleep much and the index puts her in mild category Repeat study a few days later when she slept 187 min her AHI = 4.6 which is normal, average O2 sat 94% I don't think   . Osteoarthritis 02/20/2003   Overview:  Mild bilateral hands Right CMC Right hip Left knee s/p meniscus tear All symptomatically stable Overview:  Overview:  Mild bilateral hands Right CMC Right hip Left knee s/p meniscus tear All symptomatically stable  . Osteoporosis 02/20/2003   Overview:  Bone density 2004 LS -3.32 and RH-1.62 Bone density 2008 LS -3.14 and RH -1.22 Bone density 2009 LS -3.17 and RH -1.21 Bone density 2010 LS -3.14 and RH  -1.42  Bone density 2014 LS -2.7 (L3 -3.1) and RH -1.5 Bone density 2017 LS -2.3 and RH -1.4 (neck -1.7) Vitamin D level 38 in 2012 On Boniva 2004 to 2010, restarted 2015 Repeat bone density in 2020 and stop Boniva  Last Assessment & Pl  . Prediabetes 02/20/2003   Overview:  Glucose goal < 100, last value 91 in 2017 HgbA1c goal < 6.0, last value 5.6 in 2017 Urine microalbumin negative in 2017 On diet and exercise  . Recurrent UTI 10/19/2014    Past Surgical History:  Procedure Laterality Date  . ABDOMINAL HYSTERECTOMY  1975  . URETEROSCOPY    . URETEROSCOPY WITH HOLMIUM LASER LITHOTRIPSY      There were no vitals filed for this visit.  Subjective Assessment - 12/25/19 1742    Subjective  Doing pretty good, kind of getting tired. No pain currently. Walking is about the same.    Pertinent History  LE weakness, unsteadiness. Symptoms began in the last few months. Gradual onset. Legs feeling shaky and wobbly, especially in the thighs. UE do not feel wobbly. Has not had the problem before. No pain or discomfort. No back pain. No falls in the last 6 months (has fear of falling). Pt currently lives in the [facility] [place]. Does not use an AD. Has not yet had PT before. Exercises about 35 min a day (stationary bike, elliptical, and the Nustep)    Patient Stated Goals  Improve strength and balance.    Currently in Pain?  No/denies                                PT Education - 12/25/19 1812    Education Details  ther-ex    Person(s) Educated  Patient    Methods  Explanation;Demonstration;Tactile cues;Verbal cues    Comprehension  Returned demonstration;Verbalized understanding      Objective  No latex band allergies  Blood pressure is controlledper pt  L Pelvic drop during R LE stance phase with unsteadiness dizziness when turning to the R in supine   Usually feels more off balance early in the morning when getting out of bed. Feels a little"off kilter".  Gets out at the R side of the bed.  (dizziness with supine R rotation.) Dix Hallpike (-) bilaterally  MedbridgeAccess Code: [email or web address]    Manual therapy Seated STM L and R rhomboid muscles and R UT muscles to decrease tension   Therapeutic exercise  Gait with R and L head turns 32 ft x 2 for 2 sets  Slight gait deviations in which lateral lean may play a factor. Overall slightly better observed compared to previous session.   seated chin tucks with scapular retraction 10x5 seconds for 2 sets  S/L hip abduction  L with PT assist 10x3. Less PT assist needed compared to previous sessions.  Rwith tactile cues10x3  S/L clamshell  L 10x3 R 10x3  Sitting with upright posture  PT manual trunk perturbation 30 seconds x 5 to promote trunk strength and decrease lateral lean during gait.    Stepping over 4 mini hurdles 8x CGA to min A Cues for glute activation and increasing base of support. Pt tendency to decrease step width and increase unsteadiness when stepping over with R LE secondary to L lateral lean compensation from L glute med weakness    Improved exercise technique, movement at target joints, use of target muscles after mod verbal, visual, tactile cues.     Pt response/clinical impression:   Slight improved steadiness with gait with head turns observed today. Difficulty stepping over obstacle with R LE secondary to L lateral lean compensation from L glute med weakness causing unsteadiness after pt steps over (decreased step width, leading to decrease base of support). Continued working on improving glute med strength to help decrease lateral lean during gait and improve steadiness. Pt tolerated session well without aggravation of symptoms. Pt will benefit from continued skilled physical therapy services to improve strength, balance, and function.         PT Short Term Goals - 11/28/19 1540       PT SHORT TERM GOAL #1   Title  Pt will be independent wiht her HEP to improve strength, balance, and function.    Time  3    Period  Weeks    Status  On-going    Target Date  11/14/19        PT Long Term Goals - 12/18/19 0929      PT LONG TERM GOAL #1   Title  Patient will improve bilateral hip abduction and extension strength to improve steadiness during gait and decrease fall risk.    Time  6    Period  Weeks    Status  Partially Met    Target Date  01/30/20      PT LONG TERM GOAL #2   Title  Patient will improve her Berg Balance score to 48/56 as a demonstration of improved balance.    Baseline  42/56 (10/22/2019); 48/56 (11/28/2019)    Time  8    Period  Weeks    Status  Achieved    Target Date  12/19/19      PT LONG TERM GOAL #3   Title  Pt will improve DGI score to at least 19 points as a demonstration of improved balance with gait.    Baseline  DGI 11 (12/18/2019)    Time  6    Period  Weeks    Status  New    Target Date  01/30/20            Plan - 12/25/19 1813    Clinical Impression Statement  Slight improved steadiness with gait with head turns observed today. Difficulty stepping over obstacle with R LE secondary to L lateral lean compensation from L glute med weakness causing unsteadiness after pt steps over (decreased step width, leading to decrease base of support). Continued working on improving glute med strength to help decrease lateral lean during gait and improve steadiness. Pt tolerated session well without aggravation of symptoms. Pt will benefit from continued skilled physical therapy services to improve strength, balance, and function.    Personal Factors and Comorbidities  Age;Comorbidity 3+;Past/Current Experience;Time since onset of injury/illness/exacerbation;Fitness  Comorbidities  HTN, OA, osteoporosis    Examination-Activity Limitations  Carry;Locomotion Level    Stability/Clinical Decision Making  Stable/Uncomplicated    Clinical Decision  Making  Low    Rehab Potential  Fair    PT Frequency  2x / week    PT Duration  6 weeks    PT Treatment/Interventions  Neuromuscular re-education;Therapeutic exercise;Therapeutic activities;Functional mobility training;Balance training;Patient/family education;Manual techniques;Dry needling;Vestibular;Aquatic Therapy;Canalith Repostioning;Electrical Stimulation;Iontophoresis 69m/ml Dexamethasone;Gait training;Stair training    PT Next Visit Plan  glute med, max, strunk strengthening, lumbopelvic control. Manual techniques, modalities PRN    Consulted and Agree with Plan of Care  Patient       Patient will benefit from skilled therapeutic intervention in order to improve the following deficits and impairments:  Postural dysfunction, Improper body mechanics, Decreased strength, Decreased balance  Visit Diagnosis: Unsteadiness on feet  Muscle weakness (generalized)     Problem List Patient Active Problem List   Diagnosis Date Noted  . Leg weakness 10/13/2019  . Headache 10/10/2019  . Breast tenderness 10/10/2019  . Inversion of right nipple 10/10/2019  . Acid reflux 09/08/2019  . Breast cancer screening 06/08/2019  . Cracked lip 05/25/2019  . SOBOE (shortness of breath on exertion) 05/20/2019  . Nocturia 03/21/2019  . Personal history of kidney stones 03/21/2019  . Depression[place] major[place] 07/18[place]  . Insomnia secondary to depression with anxiety 02/25/2017  . Adjustment reaction with anxiety and depression 02/26/2016  . Encounter for counseling 12/01/2015  . Microhematuria 03/10/2015  . OSA (obstructive sleep apnea) 01/13/2015  . Nephrolithiasis 12/30/2014  . Recurrent UTI 10/19/2014  . Hyperlipidemia 02/19/2013  . Essential (primary) hypertension 02/19/2013  . Diffuse cystic mastopathy of breast 02/19/2013  . Basal cell carcinoma of skin 02/19/2013  . Hypothyroidism 02/28/2012  . Cyst of ovary 02/19/2009  . Nontoxic multinodular  goiter 03/06/2003  . Prediabetes 02/20/2003  . Osteoporosis 02/20/2003  . Osteoarthritis 02/20/2003  . Impaired glucose tolerance 02/20/2003  . Asymptomatic menopausal state 02/20/1984    MJoneen BoersPT, DPT   12/25/2019, 6:39 PM  [facility] [address] [place] [address]Phone: [phone]  Fax:  [phone] Name: ECOLLENE MASSIMINOMRN: [id number] of Birth: 807-Mar-1928

## 2019-12-31 ENCOUNTER — Other Ambulatory Visit: Payer: Self-pay

## 2019-12-31 ENCOUNTER — Ambulatory Visit: Payer: Medicare HMO

## 2019-12-31 DIAGNOSIS — M6281 Muscle weakness (generalized): Secondary | ICD-10-CM | POA: Diagnosis not present

## 2019-12-31 DIAGNOSIS — R2681 Unsteadiness on feet: Secondary | ICD-10-CM | POA: Diagnosis not present

## 2019-12-31 NOTE — Therapy (Signed)
[facility] [address] [place], [place], [address] Phone: [phone]   Fax:  [phone]  Physical Therapy Treatment  Patient Details  Name: Jennifer Bernard MRN: [id number] Date of Birth: 01/24/1927 Referring Provider (PT): Charlene Scott, MD   Encounter Date: 12/31/2019  PT End of Session - 12/31/19 1404    Visit Number  18    Number of Visits  29    Date for PT Re-Evaluation  01/30/20    Authorization Type  8    Authorization Time Period  of 10 progress report    PT Start Time  1405    PT Stop Time  1445    PT Time Calculation (min)  40 min    Activity Tolerance  Patient tolerated treatment well    Behavior During Therapy  WFL for tasks assessed/performed       Past Medical History:  Diagnosis Date  . Asymptomatic menopausal state 02/20/1984   Overview:  Naturally occuring and asymptomatic Vaginal atrophy present on topical estrogen Overview:  Overview:  Naturally occuring and asymptomatic Vaginal atrophy present on topical estrogen  . Basal cell carcinoma of skin 02/19/2013   Overview:  Basal cell on her nasolabial fold 2014 S/p Mohs by Dr. Cook Followed by local dermatologist regularly Overview:  Overview:  Followed by Dr. Cook  . Cyst of ovary 02/19/2009   Overview:  Ovarian cyst on right stable 2004-2010 Cyst ? larger on CT 2012 Stable on follow up US Followed by Dr. Suzanne Miller Overview:  Overview:  Ovarian cyst on right stable 2004-2010 Cyst ? larger on CT 2012 Stable on follow up US Followed by Dr. Suzanne Miller  . Depression, major, single episode, complete remission ([facility]) 07/18/2017  . Diffuse cystic mastopathy of breast 02/19/2013   Overview:  Mother with breast cancer later in life Two breast biopsies both with normal pathology Annual mammograms recommended, last 2016 Followed at the [facility]  Last Assessment & Plan:  Relevant Hx: Course: Daily Update: Today's Plan: Overview:  Overview:   Mother with breast cancer later in life Two breast biopsies both with normal pathology Annual mammograms recommended, last 201  . Essential (primary) hypertension 02/19/2013   Overview:  Goal blood pressure < 135/85 On atenolol Overview:  Overview:  Goal blood pressure < 135/85 On atenolol  . Hyperlipidemia 02/19/2013   Overview:  Goal LDL < 130, last value 92 in 2017 On Pravachol Overview:  Overview:  Goal LDL < 130, last value 82 in 2012 On Pravachol  . Hypothyroidism 02/28/2012   Overview:  started on T4  TFT normal 2017 Followed by Dr. Jelessof Overview:  Overview:  started on T4  TFT normal 2014 Followed by Dr. Jelessof  . Impaired glucose tolerance 02/20/2003   Overview:  Overview:  Glucose goal < 100, last value 94 in 2014 HgbA1c goal < 6.0, last value 5.3 in 2014 Urine microalbumin negative in 2014 On diet and exercise  . Nontoxic multinodular goiter 03/06/2003   Overview:  Asymptomatic nodular thyroid Stable US 2004-2014 Followed by Dr. Jelessof Overview:  Overview:  Asymptomatic nodular thyroid Stable US 2004-2014 Followed by Dr. Jelessof  . OSA on CPAP 01/13/2015   Overview:  Sleep study sent to medical records:   Sleep efficiency 22%, latency 24 min, # awakenings 11, res desat N=10, index 17.8  Apnea 1 for 16 sec, hypopnea 16 for 18 sec, total sleep time only 90 min so index = 11 She did not   sleep much and the index puts her in mild category Repeat study a few days later when she slept 187 min her AHI = 4.6 which is normal, average O2 sat 94% I don't think   . Osteoarthritis 02/20/2003   Overview:  Mild bilateral hands Right CMC Right hip Left knee s/p meniscus tear All symptomatically stable Overview:  Overview:  Mild bilateral hands Right CMC Right hip Left knee s/p meniscus tear All symptomatically stable  . Osteoporosis 02/20/2003   Overview:  Bone density 2004 LS -3.32 and RH-1.62 Bone density 2008 LS -3.14 and RH -1.22 Bone density 2009 LS -3.17 and RH -1.21 Bone density 2010 LS -3.14 and RH  -1.42  Bone density 2014 LS -2.7 (L3 -3.1) and RH -1.5 Bone density 2017 LS -2.3 and RH -1.4 (neck -1.7) Vitamin D level 38 in 2012 On Boniva 2004 to 2010, restarted 2015 Repeat bone density in 2020 and stop Boniva  Last Assessment & Pl  . Prediabetes 02/20/2003   Overview:  Glucose goal < 100, last value 91 in 2017 HgbA1c goal < 6.0, last value 5.6 in 2017 Urine microalbumin negative in 2017 On diet and exercise  . Recurrent UTI 10/19/2014    Past Surgical History:  Procedure Laterality Date  . ABDOMINAL HYSTERECTOMY  1975  . URETEROSCOPY    . URETEROSCOPY WITH HOLMIUM LASER LITHOTRIPSY      There were no vitals filed for this visit.  Subjective Assessment - 12/31/19 1405    Subjective  Doing pretty good. Kind of stiff in her neck today.    Pertinent History  LE weakness, unsteadiness. Symptoms began in the last few months. Gradual onset. Legs feeling shaky and wobbly, especially in the thighs. UE do not feel wobbly. Has not had the problem before. No pain or discomfort. No back pain. No falls in the last 6 months (has fear of falling). Pt currently lives in the [facility] [place]. Does not use an AD. Has not yet had PT before. Exercises about 35 min a day (stationary bike, elliptical, and the Nustep)    Patient Stated Goals  Improve strength and balance.    Currently in Pain?  No/denies    Pain Score  0-No pain                                PT Education - 12/31/19 1420    Education Details  ther-ex    Person(s) Educated  Patient    Methods  Explanation;Demonstration;Tactile cues;Verbal cues    Comprehension  Returned demonstration;Verbalized understanding       Objective  No latex band allergies  Blood pressure is controlledper pt  L Pelvic drop during R LE stance phase with unsteadiness dizziness when turning to the R in supine   Usually feels more off balance early in the morning when getting out of bed. Feels a little"off kilter". Gets out  at the R side of the bed.  (dizziness with supine R rotation.) Dix Hallpike (-) bilaterally  MedbridgeAccess Code: [email or web address]    Manual therapy Seated STM L and R rhomboid muscles and R UT muscles to decrease tension Seated STM B cervical paraspinal muscles   Decreased neck stiffness reported   Therapeutic exercise   S/L hip abduction  L withPT assist10x3.  Rwith tactile cues10x3  S/L clamshell  L 10x3 R 10x3  Stepping over 4 mini hurdles 8x CGA to min A Some unsteadiness with stepping over   with R LE secondary to L glute med weakness  SLS with B UE light touch assist to promote glute med strength and balance  L 10x with 5-10 second holds for 2 sets  Cues and min A to place and maintain her center of gravity over her foot (base of support)  Gait with head turns 32 ft 4x  More steady compared to previous sessions.    Sitting with upright posture             PT manual trunk perturbation 30 seconds x 3 to promote trunk strength and decrease lateral lean during gait.      Improved exercise technique, movement at target joints, use of target muscles after mod verbal, visual, tactile cues.    Pt response/clinical impression: Slight improved steadiness with gait with head turns observed today. Continued working on decreasing cervical stiffness and improving glute med strength to promote steadiness with gait such as when stepping over obstacles and turning her head when ambulating. Pt tolerated session well without aggravation of symptoms. Pt will benefit from continued skilled physical therapy services to improve balance, strength, and function.    PT Short Term Goals - 11/28/19 1540      PT SHORT TERM GOAL #1   Title  Pt will be independent wiht her HEP to improve strength, balance, and function.    Time  3    Period  Weeks    Status  On-going    Target Date  11/14/19        PT  Long Term Goals - 12/18/19 0929      PT LONG TERM GOAL #1   Title  Patient will improve bilateral hip abduction and extension strength to improve steadiness during gait and decrease fall risk.    Time  6    Period  Weeks    Status  Partially Met    Target Date  01/30/20      PT LONG TERM GOAL #2   Title  Patient will improve her Berg Balance score to 48/56 as a demonstration of improved balance.    Baseline  42/56 (10/22/2019); 48/56 (11/28/2019)    Time  8    Period  Weeks    Status  Achieved    Target Date  12/19/19      PT LONG TERM GOAL #3   Title  Pt will improve DGI score to at least 19 points as a demonstration of improved balance with gait.    Baseline  DGI 11 (12/18/2019)    Time  6    Period  Weeks    Status  New    Target Date  01/30/20            Plan - 12/31/19 1420    Clinical Impression Statement  Slight improved steadiness with gait with head turns observed today. Continued working on decreasing cervical stiffness and improving glute med strength to promote steadiness with gait such as when stepping over obstacles and turning her head when ambulating. Pt tolerated session well without aggravation of symptoms. Pt will benefit from continued skilled physical therapy services to improve balance, strength, and function.    Personal Factors and Comorbidities  Age;Comorbidity 3+;Past/Current Experience;Time since onset of injury/illness/exacerbation;Fitness    Comorbidities  HTN, OA, osteoporosis    Examination-Activity Limitations  Carry;Locomotion Level    Stability/Clinical Decision Making  Stable/Uncomplicated    Rehab Potential  Fair    PT Frequency  2x / week    PT Duration  6 weeks    PT Treatment/Interventions  Neuromuscular re-education;Therapeutic exercise;Therapeutic activities;Functional mobility training;Balance training;Patient/family education;Manual techniques;Dry needling;Vestibular;Aquatic Therapy;Canalith Repostioning;Electrical  Stimulation;Iontophoresis 4mg/ml Dexamethasone;Gait training;Stair training    PT Next Visit Plan  glute med, max, strunk strengthening, lumbopelvic control. Manual techniques, modalities PRN    Consulted and Agree with Plan of Care  Patient       Patient will benefit from skilled therapeutic intervention in order to improve the following deficits and impairments:  Postural dysfunction, Improper body mechanics, Decreased strength, Decreased balance  Visit Diagnosis: Unsteadiness on feet  Muscle weakness (generalized)     Problem List Patient Active Problem List   Diagnosis Date Noted  . Leg weakness 10/13/2019  . Headache 10/10/2019  . Breast tenderness 10/10/2019  . Inversion of right nipple 10/10/2019  . Acid reflux 09/08/2019  . Breast cancer screening 06/08/2019  . Cracked lip 05/25/2019  . SOBOE (shortness of breath on exertion) 05/20/2019  . Nocturia 03/21/2019  . Personal history of kidney stones 03/21/2019  . Depression, major, single episode, complete remission ([facility]) 07/18/2017  . Insomnia secondary to depression with anxiety 02/25/2017  . Adjustment reaction with anxiety and depression 02/26/2016  . Encounter for counseling 12/01/2015  . Microhematuria 03/10/2015  . OSA (obstructive sleep apnea) 01/13/2015  . Nephrolithiasis 12/30/2014  . Recurrent UTI 10/19/2014  . Hyperlipidemia 02/19/2013  . Essential (primary) hypertension 02/19/2013  . Diffuse cystic mastopathy of breast 02/19/2013  . Basal cell carcinoma of skin 02/19/2013  . Hypothyroidism 02/28/2012  . Cyst of ovary 02/19/2009  . Nontoxic multinodular goiter 03/06/2003  . Prediabetes 02/20/2003  . Osteoporosis 02/20/2003  . Osteoarthritis 02/20/2003  . Impaired glucose tolerance 02/20/2003  . Asymptomatic menopausal state 02/20/1984    Miguel Laygo PT, DPT   12/31/2019, 4:08 PM  [facility] [address] [place], [place],  [address] Phone: [phone]   Fax:  [phone]  Name: Jennifer Bernard MRN: [id number] Date of Birth: 04/13/1927   

## 2020-01-07 DIAGNOSIS — M7918 Myalgia, other site: Secondary | ICD-10-CM | POA: Diagnosis not present

## 2020-01-07 DIAGNOSIS — G43119 Migraine with aura, intractable, without status migrainosus: Secondary | ICD-10-CM | POA: Diagnosis not present

## 2020-01-07 DIAGNOSIS — E559 Vitamin D deficiency, unspecified: Secondary | ICD-10-CM | POA: Diagnosis not present

## 2020-01-07 DIAGNOSIS — E531 Pyridoxine deficiency: Secondary | ICD-10-CM | POA: Diagnosis not present

## 2020-01-07 DIAGNOSIS — E519 Thiamine deficiency, unspecified: Secondary | ICD-10-CM | POA: Diagnosis not present

## 2020-01-07 DIAGNOSIS — M5481 Occipital neuralgia: Secondary | ICD-10-CM | POA: Diagnosis not present

## 2020-01-07 DIAGNOSIS — E538 Deficiency of other specified B group vitamins: Secondary | ICD-10-CM | POA: Diagnosis not present

## 2020-01-07 DIAGNOSIS — R4189 Other symptoms and signs involving cognitive functions and awareness: Secondary | ICD-10-CM | POA: Diagnosis not present

## 2020-01-07 DIAGNOSIS — R2689 Other abnormalities of gait and mobility: Secondary | ICD-10-CM | POA: Diagnosis not present

## 2020-01-15 ENCOUNTER — Other Ambulatory Visit: Payer: Self-pay

## 2020-01-15 ENCOUNTER — Ambulatory Visit: Payer: Medicare HMO | Attending: Internal Medicine

## 2020-01-15 DIAGNOSIS — M6281 Muscle weakness (generalized): Secondary | ICD-10-CM | POA: Diagnosis not present

## 2020-01-15 DIAGNOSIS — R2681 Unsteadiness on feet: Secondary | ICD-10-CM | POA: Diagnosis not present

## 2020-01-15 NOTE — Therapy (Signed)
[facility] [address], [place], [address] Phone: [phone]   Fax:  [phone]  Physical Therapy Treatment  Patient Details  Name: Jennifer Bernard MRN: [id number] Date of Birth: 1926-12-18 Referring Provider (PT): Einar Pheasant, MD   Encounter Date: 01/15/2020  PT End of Session - 01/15/20 1350    Visit Number  19    Number of Visits  29    Date for PT Re-Evaluation  01/30/20    Authorization Type  9    Authorization Time Period  of 10 progress report    PT Start Time  1348    PT Stop Time  1431    PT Time Calculation (min)  43 min    Activity Tolerance  Patient tolerated treatment well    Behavior During Therapy  [facility] for tasks assessed/performed       Past Medical History:  Diagnosis Date  . Asymptomatic menopausal state 02/20/1984   Overview:  Naturally occuring and asymptomatic Vaginal atrophy present on topical estrogen Overview:  Overview:  Naturally occuring and asymptomatic Vaginal atrophy present on topical estrogen  . Basal cell carcinoma of skin 02/19/2013   Overview:  Basal cell on her nasolabial fold 2014 S/p Mohs by Dr. Lacinda Axon Followed by local dermatologist regularly Overview:  Overview:  Followed by Dr. Lacinda Axon  . Cyst of ovary 02/19/2009   Overview:  Ovarian cyst on right stable 2004-2010 Cyst ? larger on CT 2012 Stable on follow up US Followed by Dr. Edwinna Areola Overview:  Overview:  Ovarian cyst on right stable 2004-2010 Cyst ? larger on CT 2012 Stable on follow up US Followed by Dr. Edwinna Areola  . Depression, major, single episode, complete remission ([place]) 07/18/2017  . Diffuse cystic mastopathy of breast 02/19/2013   Overview:  Mother with breast cancer later in life Two breast biopsies both with normal pathology Annual mammograms recommended, last 2016 Followed at the [facility]:  Relevant Hx: Course: Daily Update: Today's Plan: Overview:  Overview:   Mother with breast cancer later in life Two breast biopsies both with normal pathology Annual mammograms recommended, last 86  . Essential (primary) hypertension 02/19/2013   Overview:  Goal blood pressure < 135/85 On atenolol Overview:  Overview:  Goal blood pressure < 135/85 On atenolol  . Hyperlipidemia 02/19/2013   Overview:  Goal LDL < 130, last value 92 in 2017 On Pravachol Overview:  Overview:  Goal LDL < 130, last value 82 in 2012 On Pravachol  . Hypothyroidism 02/28/2012   Overview:  started on T4  TFT normal 2017 Followed by Dr. Almon Hercules Overview:  Overview:  started on T4  TFT normal 2014 Followed by Dr. Almon Hercules  . Impaired glucose tolerance 02/20/2003   Overview:  Overview:  Glucose goal < 100, last value 94 in 2014 HgbA1c goal < 6.0, last value 5.3 in 2014 Urine microalbumin negative in 2014 On diet and exercise  . Nontoxic multinodular goiter 03/06/2003   Overview:  Asymptomatic nodular thyroid Stable [place] 2004-2014 Followed by Dr. Almon Hercules Overview:  Overview:  Asymptomatic nodular thyroid Stable [place] 2004-2014 Followed by Dr. Almon Hercules  . OSA on CPAP 01/13/2015   Overview:  Sleep study sent to medical records:   Sleep efficiency 22%, latency 24 min, # awakenings 11, res desat N=10, index 17.8  Apnea 1 for 16 sec, hypopnea 16 for 18 sec, total sleep time only 90 min so index = 11 She did not  sleep much and the index puts her in mild category Repeat study a few days later when she slept 187 min her AHI = 4.6 which is normal, average O2 sat 94% I don't think   . Osteoarthritis 02/20/2003   Overview:  Mild bilateral hands Right CMC Right hip Left knee s/p meniscus tear All symptomatically stable Overview:  Overview:  Mild bilateral hands Right CMC Right hip Left knee s/p meniscus tear All symptomatically stable  . Osteoporosis 02/20/2003   Overview:  Bone density 2004 LS -3.32 and RH-1.62 Bone density 2008 LS -3.14 and RH -1.22 Bone density 2009 LS -3.17 and RH -1.21 Bone density 2010 LS -3.14 and RH  -1.42  Bone density 2014 LS -2.7 (L3 -3.1) and RH -1.5 Bone density 2017 LS -2.3 and RH -1.4 (neck -1.7) Vitamin D level 38 in 2012 On Boniva 2004 to 2010, restarted 2015 Repeat bone density in 2020 and stop Boniva  Last Assessment & Pl  . Prediabetes 02/20/2003   Overview:  Glucose goal < 100, last value 91 in 2017 HgbA1c goal < 6.0, last value 5.6 in 2017 Urine microalbumin negative in 2017 On diet and exercise  . Recurrent UTI 10/19/2014    Past Surgical History:  Procedure Laterality Date  . ABDOMINAL HYSTERECTOMY  1975  . URETEROSCOPY    . URETEROSCOPY WITH HOLMIUM LASER LITHOTRIPSY      There were no vitals filed for this visit.  Subjective Assessment - 01/15/20 1351    Subjective  Doing good. Just has a stiff neck, other than that, pretty good.    Pertinent History  LE weakness, unsteadiness. Symptoms began in the last few months. Gradual onset. Legs feeling shaky and wobbly, especially in the thighs. UE do not feel wobbly. Has not had the problem before. No pain or discomfort. No back pain. No falls in the last 6 months (has fear of falling). Pt currently lives in the [facility] [place]. Does not use an AD. Has not yet had PT before. Exercises about 35 min a day (stationary bike, elliptical, and the Nustep)    Patient Stated Goals  Improve strength and balance.    Currently in Pain?  No/denies    Pain Score  --   stiff neck                               PT Education - 01/15/20 1638    Education Details  ther-ex    Person(s) Educated  Patient    Methods  Explanation;Demonstration;Tactile cues;Verbal cues    Comprehension  Returned demonstration;Verbalized understanding       Objective  No latex band allergies  Blood pressure is controlledper pt  L Pelvic drop during R LE stance phase with unsteadiness dizziness when turning to the R in supine   Usually feels more off balance early in the morning when getting out of bed. Feels a  little"off kilter". Gets out at the R side of the bed.  (dizziness with supine R rotation.) [name] (-) bilaterally  MedbridgeAccess Code: [email or web address]    Manual therapy Caudal pressure to R distal scalene area with R and L cervical rotation 10x3  Decreased neck stiffness reported     Seated STM B cervical paraspinal muscles   Decreased neck stiffness reported   Therapeutic exercise   S/L hip abduction  L withPT assist10x3.  Rwith tactile cues10x3  S/L clamshell  L 10x3 R 10x3  Supine posterior  pelvic tilt 10x3 with 5 second holds   SLS without UE assist to promote glute med strength and balance             L 5x with 10 second holds for 2 sets             Cues and min A to place and maintain her center of gravity over her foot (base of support)  Better able to maintain balance on R LE compared to L.    Chin tucks 10x5 seconds    Improved exercise technique, movement at target joints, use of target muscles after mod verbal, visual, tactile cues.    Pt response/clinical impression: Continued working on improving glute and trunk strength to promote ability to step over obstacles with less difficulty. Continued working on decreasing neck stiffness to improve ability to ambulate more steadily when looking around. Pt tolerated session well without aggravation of symptoms. Pt will benefit from continued skilled physical therapy services to improve strength and function.      PT Short Term Goals - 11/28/19 1540      PT SHORT TERM GOAL #1   Title  Pt will be independent wiht her HEP to improve strength, balance, and function.    Time  3    Period  Weeks    Status  On-going    Target Date  11/14/19        PT Long Term Goals - 12/18/19 0929      PT LONG TERM GOAL #1   Title  Patient will improve bilateral hip abduction and extension strength to improve steadiness during  gait and decrease fall risk.    Time  6    Period  Weeks    Status  Partially Met    Target Date  01/30/20      PT LONG TERM GOAL #2   Title  Patient will improve her Berg Balance score to 48/56 as a demonstration of improved balance.    Baseline  42/56 (10/22/2019); 48/56 (11/28/2019)    Time  8    Period  Weeks    Status  Achieved    Target Date  12/19/19      PT LONG TERM GOAL #3   Title  Pt will improve DGI score to at least 19 points as a demonstration of improved balance with gait.    Baseline  DGI 11 (12/18/2019)    Time  6    Period  Weeks    Status  New    Target Date  01/30/20            Plan - 01/15/20 1638    Clinical Impression Statement  Continued working on improving glute and trunk strength to promote ability to step over obstacles with less difficulty. Continued working on decreasing neck stiffness to improve ability to ambulate more steadily when looking around. Pt tolerated session well without aggravation of symptoms. Pt will benefit from continued skilled physical therapy services to improve strength and function.    Personal Factors and Comorbidities  Age;Comorbidity 3+;Past/Current Experience;Time since onset of injury/illness/exacerbation;Fitness    Comorbidities  HTN, OA, osteoporosis    Examination-Activity Limitations  Carry;Locomotion Level    Stability/Clinical Decision Making  Stable/Uncomplicated    Rehab Potential  Fair    PT Frequency  2x / week    PT Duration  6 weeks    PT Treatment/Interventions  Neuromuscular re-education;Therapeutic exercise;Therapeutic activities;Functional mobility training;Balance training;Patient/family education;Manual techniques;Dry needling;Vestibular;Aquatic Therapy;Canalith Repostioning;Electrical Stimulation;Iontophoresis 46m/ml Dexamethasone;Gait training;Stair  training    PT Next Visit Plan  glute med, max, strunk strengthening, lumbopelvic control. Manual techniques, modalities PRN    Consulted and Agree with Plan  of Care  Patient       Patient will benefit from skilled therapeutic intervention in order to improve the following deficits and impairments:  Postural dysfunction, Improper body mechanics, Decreased strength, Decreased balance  Visit Diagnosis: Unsteadiness on feet  Muscle weakness (generalized)     Problem List Patient Active Problem List   Diagnosis Date Noted  . Leg weakness 10/13/2019  . Headache 10/10/2019  . Breast tenderness 10/10/2019  . Inversion of right nipple 10/10/2019  . Acid reflux 09/08/2019  . Breast cancer screening 06/08/2019  . Cracked lip 05/25/2019  . SOBOE (shortness of breath on exertion) 05/20/2019  . Nocturia 03/21/2019  . Personal history of kidney stones 03/21/2019  . Depression, major, single episode, complete remission ([place]) 07/18/2017  . Insomnia secondary to depression with anxiety 02/25/2017  . Adjustment reaction with anxiety and depression 02/26/2016  . Encounter for counseling 12/01/2015  . Microhematuria 03/10/2015  . OSA (obstructive sleep apnea) 01/13/2015  . Nephrolithiasis 12/30/2014  . Recurrent UTI 10/19/2014  . Hyperlipidemia 02/19/2013  . Essential (primary) hypertension 02/19/2013  . Diffuse cystic mastopathy of breast 02/19/2013  . Basal cell carcinoma of skin 02/19/2013  . Hypothyroidism 02/28/2012  . Cyst of ovary 02/19/2009  . Nontoxic multinodular goiter 03/06/2003  . Prediabetes 02/20/2003  . Osteoporosis 02/20/2003  . Osteoarthritis 02/20/2003  . Impaired glucose tolerance 02/20/2003  . Asymptomatic menopausal state 02/20/1984   Joneen Boers PT, DPT   01/15/2020, 4:45 PM  [facility] [address], [place], [address] Phone: [phone]   Fax:  [phone]  Name: Jennifer Bernard MRN: [id number] Date of Birth: 1927/04/09

## 2020-01-20 ENCOUNTER — Other Ambulatory Visit: Payer: Self-pay

## 2020-01-20 ENCOUNTER — Ambulatory Visit: Payer: Medicare HMO

## 2020-01-20 DIAGNOSIS — M6281 Muscle weakness (generalized): Secondary | ICD-10-CM | POA: Diagnosis not present

## 2020-01-20 DIAGNOSIS — R2681 Unsteadiness on feet: Secondary | ICD-10-CM

## 2020-01-20 NOTE — Therapy (Signed)
[facility] [address] , [place], [address] Phone: [phone]   Fax:  [phone]  Physical Therapy Treatment And Progress Report (12/03/2019 - 01/20/2020)  Patient Details  Name: Jennifer Bernard MRN: [id number] Date of Birth: 10/05/1926 Referring Provider (PT): Charlene Scott, MD   Encounter Date: 01/20/2020  PT End of Session - 01/20/20 1512    Visit Number  20    Number of Visits  35    Date for PT Re-Evaluation  02/13/20    Authorization Type  10    Authorization Time Period  of 10 progress report    PT Start Time  1512    PT Stop Time  1559    PT Time Calculation (min)  47 min    Activity Tolerance  Patient tolerated treatment well    Behavior During Therapy  WFL for tasks assessed/performed       Past Medical History:  Diagnosis Date  . Asymptomatic menopausal state 02/20/1984   Overview:  Naturally occuring and asymptomatic Vaginal atrophy present on topical estrogen Overview:  Overview:  Naturally occuring and asymptomatic Vaginal atrophy present on topical estrogen  . Basal cell carcinoma of skin 02/19/2013   Overview:  Basal cell on her nasolabial fold 2014 S/p Mohs by Dr. Cook Followed by local dermatologist regularly Overview:  Overview:  Followed by Dr. Cook  . Cyst of ovary 02/19/2009   Overview:  Ovarian cyst on right stable 2004-2010 Cyst ? larger on CT 2012 Stable on follow up US Followed by Dr. Suzanne Miller Overview:  Overview:  Ovarian cyst on right stable 2004-2010 Cyst ? larger on CT 2012 Stable on follow up US Followed by Dr. Suzanne Miller  . Depression, major, single episode, complete remission (HCC) 07/18/2017  . Diffuse cystic mastopathy of breast 02/19/2013   Overview:  Mother with breast cancer later in life Two breast biopsies both with normal pathology Annual mammograms recommended, last 2016 Followed at the [facility]  Last Assessment & Plan:  Relevant Hx: Course: Daily  Update: Today's Plan: Overview:  Overview:  Mother with breast cancer later in life Two breast biopsies both with normal pathology Annual mammograms recommended, last 201  . Essential (primary) hypertension 02/19/2013   Overview:  Goal blood pressure < 135/85 On atenolol Overview:  Overview:  Goal blood pressure < 135/85 On atenolol  . Hyperlipidemia 02/19/2013   Overview:  Goal LDL < 130, last value 92 in 2017 On Pravachol Overview:  Overview:  Goal LDL < 130, last value 82 in 2012 On Pravachol  . Hypothyroidism 02/28/2012   Overview:  started on T4  TFT normal 2017 Followed by Dr. Jelessof Overview:  Overview:  started on T4  TFT normal 2014 Followed by Dr. Jelessof  . Impaired glucose tolerance 02/20/2003   Overview:  Overview:  Glucose goal < 100, last value 94 in 2014 HgbA1c goal < 6.0, last value 5.3 in 2014 Urine microalbumin negative in 2014 On diet and exercise  . Nontoxic multinodular goiter 03/06/2003   Overview:  Asymptomatic nodular thyroid Stable US 2004-2014 Followed by Dr. Jelessof Overview:  Overview:  Asymptomatic nodular thyroid Stable US 2004-2014 Followed by Dr. Jelessof  . OSA on CPAP 01/13/2015   Overview:  Sleep study sent to medical records:   Sleep efficiency 22%, latency 24 min, # awakenings 11, res desat N=10, index 17.8  Apnea 1 for 16 sec, hypopnea 16 for 18 sec, total sleep time only 90 min so   index = 11 She did not sleep much and the index puts her in mild category Repeat study a few days later when she slept 187 min her AHI = 4.6 which is normal, average O2 sat 94% I don't think   . Osteoarthritis 02/20/2003   Overview:  Mild bilateral hands Right CMC Right hip Left knee s/p meniscus tear All symptomatically stable Overview:  Overview:  Mild bilateral hands Right CMC Right hip Left knee s/p meniscus tear All symptomatically stable  . Osteoporosis 02/20/2003   Overview:  Bone density 2004 LS -3.32 and RH-1.62 Bone density 2008 LS -3.14 and RH -1.22 Bone density 2009 LS -3.17 and RH  -1.21 Bone density 2010 LS -3.14 and RH -1.42  Bone density 2014 LS -2.7 (L3 -3.1) and RH -1.5 Bone density 2017 LS -2.3 and RH -1.4 (neck -1.7) Vitamin D level 38 in 2012 On Boniva 2004 to 2010, restarted 2015 Repeat bone density in 2020 and stop Boniva  Last Assessment & Pl  . Prediabetes 02/20/2003   Overview:  Glucose goal < 100, last value 91 in 2017 HgbA1c goal < 6.0, last value 5.6 in 2017 Urine microalbumin negative in 2017 On diet and exercise  . Recurrent UTI 10/19/2014    Past Surgical History:  Procedure Laterality Date  . ABDOMINAL HYSTERECTOMY  1975  . URETEROSCOPY    . URETEROSCOPY WITH HOLMIUM LASER LITHOTRIPSY      There were no vitals filed for this visit.  Subjective Assessment - 01/20/20 1514    Subjective  No pain or discomfort. Still a little stiff in her neck. Balance, strength, and walking is the same pt thinks.    Pertinent History  LE weakness, unsteadiness. Symptoms began in the last few months. Gradual onset. Legs feeling shaky and wobbly, especially in the thighs. UE do not feel wobbly. Has not had the problem before. No pain or discomfort. No back pain. No falls in the last 6 months (has fear of falling). Pt currently lives in the [place] [facility] [place]. Does not use an AD. Has not yet had PT before. Exercises about 35 min a day (stationary bike, elliptical, and the Nustep)    Patient Stated Goals  Improve strength and balance.    Currently in Pain?  No/denies    Pain Score  0-No pain         [facility] PT Assessment - 01/20/20 1456      Strength   Right Hip Extension  5/5   seated manually resisted   Right Hip ABduction  4/5    Left Hip Extension  4+/5   seated manually resisted   Left Hip ABduction  3+/5      Dynamic Gait Index   Level Surface  Mild Impairment    Change in Gait Speed  Mild Impairment    Gait with Horizontal Head Turns  Mild Impairment    Gait with Vertical Head Turns  Mild Impairment    Gait and Pivot Turn  Normal    Step Over Obstacle   Severe Impairment    Step Around Obstacles  Mild Impairment    Steps  Mild Impairment    Total Score  15    DGI comment:  < 19 suggests increased fall risk                            PT Education - 01/20/20 1556    Education Details  ther-ex    Person(s)   Educated  Patient    Methods  Explanation;Demonstration;Tactile cues;Verbal cues    Comprehension  Returned demonstration;Verbalized understanding      Objective  No latex band allergies  Blood pressure is controlledper pt  L Pelvic drop during R LE stance phase with unsteadiness dizziness when turning to the R in supine   Usually feels more off balance early in the morning when getting out of bed. Feels a little"off kilter". Gets out at the R side of the bed.  (dizziness with supine R rotation.) Dix Hallpike (-) bilaterally  MedbridgeAccess Code: [email or web address]     Therapeutic exercise   Seated manually resisted hip extension, S/L hip abduction 1-2x each way for each LE  Improved B glute max strength    S/L hip abduction  L withPT assist10x3.  Rwith tactile cues10x3  Directed patient with gait with normal gait speed, with changes in speed, 180 degree pivot turn, with R and L cervical rotation position, with cervical flexion and extension position, stepping around obstacles, stepping over an obstacle, ascending and descending 4 regular steps with UE assist   Stepping over a shoe box multiple times (at least 4x) . LOB x 1 with Min A to recover.   Stepping over 6 mini hurdles 4x CGA with alternating LE   Improved overall balance and obstacle clearance observed. LOB 1x to the L with min A assist.    SLS without UE assistto promote glute med strength and balance CGA to min PT assist L 5x with 10 second holds for 2 sets  Cues and min A to place and maintain her center of gravity over her foot (base of support)               Improved ability to do so compared to previous visits.    Reviewed progress/current status with PT towards goals.    Improved exercise technique, movement at target joints, use of target muscles after min to mod verbal, visual, tactile cues.    Pt response/clinical impression: PT demonstrates overall improved balance with improved DGI score by 4 poiints since last progress report as well as overall improved bilateral hip strength (except L glute med) since initial evaluation. Pt making progress with PT towards goals. Pt still demonstrates L glute med weakness which makes negotiating obstacles difficult. Pt however better able to step over and clear obstacles such as a shoe box and mini hurdles overall with better weight shift of her center of gravity forward onto her foot that is in front. Pt tolerated session well without aggravation of symptoms. Pt will benefit from continued skilled physical therapy services to improve strength, balance, function, and decrease fall risk.        PT Short Term Goals - 11/28/19 1540      PT SHORT TERM GOAL #1   Title  Pt will be independent wiht her HEP to improve strength, balance, and function.    Time  3    Period  Weeks    Status  On-going    Target Date  11/14/19        PT Long Term Goals - 01/20/20 1714      PT LONG TERM GOAL #1   Title  Patient will improve bilateral hip abduction and extension strength to improve steadiness during gait and decrease fall risk.    Time  6    Period  Weeks    Status  Partially Met    Target Date  02/13/20      PT   LONG TERM GOAL #2   Title  Patient will improve her Berg Balance score to 48/56 as a demonstration of improved balance.    Baseline  42/56 (10/22/2019); 48/56 (11/28/2019)    Time  8    Period  Weeks    Status  Achieved      PT LONG TERM GOAL #3   Title  Pt will improve DGI score to at least 19 points as a demonstration of improved balance with gait.    Baseline  DGI 11  (12/18/2019); 15 (01/20/2020)    Time  3    Period  Weeks    Status  Partially Met    Target Date  02/13/20            Plan - 01/20/20 1455    Clinical Impression Statement  PT demonstrates overall improved balance with improved DGI score by 4 poiints since last progress report as well as overall improved bilateral hip strength (except L glute med) since initial evaluation. Pt making progress with PT towards goals. Pt still demonstrates L glute med weakness which makes negotiating obstacles difficult. Pt however better able to step over and clear obstacles such as a shoe box and mini hurdles overall with better weight shift of her center of gravity forward onto her foot that is in front. Pt tolerated session well without aggravation of symptoms. Pt will benefit from continued skilled physical therapy services to improve strength, balance, function, and decrease fall risk.    Personal Factors and Comorbidities  Age;Comorbidity 3+;Past/Current Experience;Time since onset of injury/illness/exacerbation;Fitness    Comorbidities  HTN, OA, osteoporosis    Examination-Activity Limitations  Carry;Locomotion Level    Stability/Clinical Decision Making  Stable/Uncomplicated    Clinical Decision Making  Low    Rehab Potential  Fair    PT Frequency  2x / week    PT Duration  3 weeks    PT Treatment/Interventions  Neuromuscular re-education;Therapeutic exercise;Therapeutic activities;Functional mobility training;Balance training;Patient/family education;Manual techniques;Dry needling;Vestibular;Aquatic Therapy;Canalith Repostioning;Electrical Stimulation;Iontophoresis 4mg/ml Dexamethasone;Gait training;Stair training    PT Next Visit Plan  glute med, max, strunk strengthening, lumbopelvic control. Manual techniques, modalities PRN    Consulted and Agree with Plan of Care  Patient       Patient will benefit from skilled therapeutic intervention in order to improve the following deficits and impairments:   Postural dysfunction, Improper body mechanics, Decreased strength, Decreased balance  Visit Diagnosis: Unsteadiness on feet - Plan: PT plan of care cert/re-cert  Muscle weakness (generalized) - Plan: PT plan of care cert/re-cert     Problem List Patient Active Problem List   Diagnosis Date Noted  . Leg weakness 10/13/2019  . Headache 10/10/2019  . Breast tenderness 10/10/2019  . Inversion of right nipple 10/10/2019  . Acid reflux 09/08/2019  . Breast cancer screening 06/08/2019  . Cracked lip 05/25/2019  . SOBOE (shortness of breath on exertion) 05/20/2019  . Nocturia 03/21/2019  . Personal history of kidney stones 03/21/2019  . Depression, major, single episode, complete remission (HCC) 07/18/2017  . Insomnia secondary to depression with anxiety 02/25/2017  . Adjustment reaction with anxiety and depression 02/26/2016  . Encounter for counseling 12/01/2015  . Microhematuria 03/10/2015  . OSA (obstructive sleep apnea) 01/13/2015  . Nephrolithiasis 12/30/2014  . Recurrent UTI 10/19/2014  . Hyperlipidemia 02/19/2013  . Essential (primary) hypertension 02/19/2013  . Diffuse cystic mastopathy of breast 02/19/2013  . Basal cell carcinoma of skin 02/19/2013  . Hypothyroidism 02/28/2012  . Cyst of ovary 02/19/2009  .   Nontoxic multinodular goiter 03/06/2003  . Prediabetes 02/20/2003  . Osteoporosis 02/20/2003  . Osteoarthritis 02/20/2003  . Impaired glucose tolerance 02/20/2003  . Asymptomatic menopausal state 02/20/1984   Thank you for your referral.  Joneen Boers PT, DPT   01/20/2020, 5:29 PM  [facility] [address] [place], [address] Phone: [phone]   Fax:  [phone]  Name: ABRIANNA SIDMAN MRN: [id number] Date of Birth: [birth year]

## 2020-01-22 ENCOUNTER — Telehealth: Payer: Self-pay | Admitting: Internal Medicine

## 2020-01-22 ENCOUNTER — Ambulatory Visit: Payer: Medicare HMO

## 2020-01-22 DIAGNOSIS — R2689 Other abnormalities of gait and mobility: Secondary | ICD-10-CM

## 2020-01-22 DIAGNOSIS — E531 Pyridoxine deficiency: Secondary | ICD-10-CM

## 2020-01-22 DIAGNOSIS — G43119 Migraine with aura, intractable, without status migrainosus: Secondary | ICD-10-CM

## 2020-01-22 DIAGNOSIS — E519 Thiamine deficiency, unspecified: Secondary | ICD-10-CM

## 2020-01-22 DIAGNOSIS — E538 Deficiency of other specified B group vitamins: Secondary | ICD-10-CM

## 2020-01-22 NOTE — Telephone Encounter (Signed)
Pt called and would like a call back about her lab appt for 6/15

## 2020-01-23 NOTE — Telephone Encounter (Signed)
LMTCB

## 2020-01-23 NOTE — Telephone Encounter (Signed)
Patient called backed and wanted to know if Dr. Nicki Reaper received the lab orders that Dr. Jonathon Jordan wanted done also. Patient also wanted to do a urine sample. She is getting up in the night time more then once to go to the bathroom.

## 2020-01-24 NOTE — Telephone Encounter (Signed)
Called Neurology. Office was closed. Have not received lab orders. I will call them Monday. Just need the ok from you to order labs requesting to have sent to Dr Manuella Ghazi since you will not be in office next week.

## 2020-01-25 NOTE — Telephone Encounter (Signed)
Ok to order labs. Per neurology note, states was faxed.  Will need to contact them prior to pt coming in for lab.  Ok to order labs needed.

## 2020-01-27 ENCOUNTER — Other Ambulatory Visit: Payer: Self-pay

## 2020-01-27 ENCOUNTER — Other Ambulatory Visit: Payer: Self-pay | Admitting: Internal Medicine

## 2020-01-27 ENCOUNTER — Ambulatory Visit: Payer: Medicare HMO

## 2020-01-27 DIAGNOSIS — M6281 Muscle weakness (generalized): Secondary | ICD-10-CM

## 2020-01-27 DIAGNOSIS — R2681 Unsteadiness on feet: Secondary | ICD-10-CM

## 2020-01-27 DIAGNOSIS — G43809 Other migraine, not intractable, without status migrainosus: Secondary | ICD-10-CM

## 2020-01-27 NOTE — Therapy (Signed)
[facility] [address], [place], [address] Phone: [phone]   Fax:  [phone]  Physical Therapy Treatment  Patient Details  Name: Jennifer Bernard MRN: [id number] Date of Birth: [birth year] Referring Provider (PT): Jennifer Pheasant, MD   Encounter Date: 01/27/2020   PT End of Session - 01/27/20 1518    Visit Number 21    Number of Visits 35    Date for PT Re-Evaluation 02/13/20    Authorization Type 1    Authorization Time Period of 10 progress report    PT Start Time 1518    PT Stop Time 1558    PT Time Calculation (min) 40 min    Activity Tolerance Patient tolerated treatment well    Behavior During Therapy [facility] for tasks assessed/performed           Past Medical History:  Diagnosis Date  . Asymptomatic menopausal state 02/20/1984   Overview:  Naturally occuring and asymptomatic Vaginal atrophy present on topical estrogen Overview:  Overview:  Naturally occuring and asymptomatic Vaginal atrophy present on topical estrogen  . Basal cell carcinoma of skin 02/19/2013   Overview:  Basal cell on her nasolabial fold 2014 S/p Mohs by Dr. Lacinda Bernard Followed by local dermatologist regularly Overview:  Overview:  Followed by Dr. Lacinda Bernard  . Cyst of ovary 02/19/2009   Overview:  Ovarian cyst on right stable 2004-2010 Cyst ? larger on CT 2012 Stable on follow up US Followed by Dr. Edwinna Bernard Overview:  Overview:  Ovarian cyst on right stable 2004-2010 Cyst ? larger on CT 2012 Stable on follow up US Followed by Dr. Edwinna Bernard  . Depression, major, single episode, complete remission ([place]) 07/18/2017  . Diffuse cystic mastopathy of breast 02/19/2013   Overview:  Mother with breast cancer later in life Two breast biopsies both with normal pathology Annual mammograms recommended, last 2016 Followed at the [facility]:  Relevant Hx: Course: Daily Update: Today's Plan: Overview:  Overview:   Mother with breast cancer later in life Two breast biopsies both with normal pathology Annual mammograms recommended, last 4  . Essential (primary) hypertension 02/19/2013   Overview:  Goal blood pressure < 135/85 On atenolol Overview:  Overview:  Goal blood pressure < 135/85 On atenolol  . Hyperlipidemia 02/19/2013   Overview:  Goal LDL < 130, last value 92 in 2017 On Pravachol Overview:  Overview:  Goal LDL < 130, last value 82 in 2012 On Pravachol  . Hypothyroidism 02/28/2012   Overview:  started on T4  TFT normal 2017 Followed by Dr. Almon Bernard Overview:  Overview:  started on T4  TFT normal 2014 Followed by Dr. Almon Bernard  . Impaired glucose tolerance 02/20/2003   Overview:  Overview:  Glucose goal < 100, last value 94 in 2014 HgbA1c goal < 6.0, last value 5.3 in 2014 Urine microalbumin negative in 2014 On diet and exercise  . Nontoxic multinodular goiter 03/06/2003   Overview:  Asymptomatic nodular thyroid Stable [place] 2004-2014 Followed by Dr. Almon Bernard Overview:  Overview:  Asymptomatic nodular thyroid Stable [place] 2004-2014 Followed by Dr. Almon Bernard  . OSA on CPAP 01/13/2015   Overview:  Sleep study sent to medical records:   Sleep efficiency 22%, latency 24 min, # awakenings 11, res desat N=10, index 17.8  Apnea 1 for 16 sec, hypopnea 16 for 18 sec, total sleep time only 90 min so index = 11 She did not sleep much and the index  puts her in mild category Repeat study a few days later when she slept 187 min her AHI = 4.6 which is normal, average O2 sat 94% I don't think   . Osteoarthritis 02/20/2003   Overview:  Mild bilateral hands Right CMC Right hip Left knee s/p meniscus tear All symptomatically stable Overview:  Overview:  Mild bilateral hands Right CMC Right hip Left knee s/p meniscus tear All symptomatically stable  . Osteoporosis 02/20/2003   Overview:  Bone density 2004 LS -3.32 and RH-1.62 Bone density 2008 LS -3.14 and RH -1.22 Bone density 2009 LS -3.17 and RH -1.21 Bone density 2010 LS -3.14 and RH  -1.42  Bone density 2014 LS -2.7 (L3 -3.1) and RH -1.5 Bone density 2017 LS -2.3 and RH -1.4 (neck -1.7) Vitamin D level 38 in 2012 On Boniva 2004 to 2010, restarted 2015 Repeat bone density in 2020 and stop Boniva  Last Assessment & Pl  . Prediabetes 02/20/2003   Overview:  Glucose goal < 100, last value 91 in 2017 HgbA1c goal < 6.0, last value 5.6 in 2017 Urine microalbumin negative in 2017 On diet and exercise  . Recurrent UTI 10/19/2014    Past Surgical History:  Procedure Laterality Date  . ABDOMINAL HYSTERECTOMY  1975  . URETEROSCOPY    . URETEROSCOPY WITH HOLMIUM LASER LITHOTRIPSY      There were no vitals filed for this visit.   Subjective Assessment - 01/27/20 1519    Subjective Just came back from [place], her sister in law just passed away. Jennifer Bernard has a stiff neck on the R side.    Pertinent History LE weakness, unsteadiness. Symptoms began in the last few months. Gradual onset. Legs feeling shaky and wobbly, especially in the thighs. UE do not feel wobbly. Has not had the problem before. No pain or discomfort. No back pain. No falls in the last 6 months (has fear of falling). Pt currently lives in the [place] [facility] [place]. Does not use an AD. Has not yet had PT before. Exercises about 35 min a day (stationary bike, elliptical, and the Nustep)    Patient Stated Goals Improve strength and balance.    Currently in Pain? No/denies    Pain Score 0-No pain    Pain Orientation Right    Pain Descriptors / Indicators Tightness                                     PT Education - 01/27/20 1550    Education Details ther-ex    Person(s) Educated Patient    Methods Explanation;Demonstration;Tactile cues;Verbal cues    Comprehension Returned demonstration;Verbalized understanding              Objective  No latex band allergies  Blood pressure is controlledper pt  L Pelvic drop during R LE stance phase with unsteadiness dizziness when turning to the R  in supine   Usually feels more off balance early in the morning when getting out of bed. Feels a little"off kilter". Gets out at the R side of the bed.  (dizziness with supine R rotation.) Jennifer Bernard (-) bilaterally  MedbridgeAccess Code: [email or web address]   Manual therapy Caudal pressure to R distal scalene area with R and L cervical rotation 10x3             Decreased neck stiffness reported   seated STM R upper trap muscle to decrease tension   Seated  STM B cervical paraspinal muscles   Decreased neck stiffnessreported     Therapeutic exercise  Side stepping 32 ft to the R and 32 ft to the L 2x to promote glute med muscle strengthening  Forward step up onto 4 inch step and back down with contralateral LE CGA to min A  R 10x2  L 10x2  Difficulty stepping back down with R LE secondary to L glute med weakness.   Improved ability to perform for L LE during 2nd set   S/L hip abduction  L withPT assist10x3.  Rwith tactile cues10x3  Standing alternating toe taps onto cones CGA to min A  R 10x  L 10x    Improved exercise technique, movement at target joints, use of target muscles after min to mod verbal, visual, tactile cues.    Pt response/clinical impression:  Decreased neck stiffness with treatment to decrease R upper trap, cervical paraspinal, and R scalene muscle tension. Performed to promote ability to ambulate more steadily while looking around. Continued working on improving glute strength to promote single leg balance strength to promote ability to clear obstacles safely. Pt tolerated session well without aggravation of symptoms. Pt will benefit from continued skilled physical therapy services to improve strength, balance, function, and decrease fall risk.        PT Short Term Goals - 11/28/19 1540      PT SHORT TERM GOAL #1   Title Pt will be independent wiht her HEP to improve strength, balance,  and function.    Time 3    Period Weeks    Status On-going    Target Date 11/14/19             PT Long Term Goals - 01/20/20 1714      PT LONG TERM GOAL #1   Title Patient will improve bilateral hip abduction and extension strength to improve steadiness during gait and decrease fall risk.    Time 6    Period Weeks    Status Partially Met    Target Date 02/13/20      PT LONG TERM GOAL #2   Title Patient will improve her Berg Balance score to 48/56 as a demonstration of improved balance.    Baseline 42/56 (10/22/2019); 48/56 (11/28/2019)    Time 8    Period Weeks    Status Achieved      PT LONG TERM GOAL #3   Title Pt will improve DGI score to at least 19 points as a demonstration of improved balance with gait.    Baseline DGI 11 (12/18/2019); 15 (01/20/2020)    Time 3    Period Weeks    Status Partially Met    Target Date 02/13/20                 Plan - 01/27/20 1517    Clinical Impression Statement Decreased neck stiffness with treatment to decrease R upper trap, cervical paraspinal, and R scalene muscle tension. Performed to promote ability to ambulate more steadily while looking around. Continued working on improving glute strength to promote single leg balance strength to promote ability to clear obstacles safely. Pt tolerated session well without aggravation of symptoms. Pt will benefit from continued skilled physical therapy services to improve strength, balance, function, and decrease fall risk.    Personal Factors and Comorbidities Age;Comorbidity 3+;Past/Current Experience;Time since onset of injury/illness/exacerbation;Fitness    Comorbidities HTN, OA, osteoporosis    Examination-Activity Limitations Carry;Locomotion Level    Stability/Clinical Decision Making Stable/Uncomplicated  Rehab Potential Fair    PT Frequency 2x / week    PT Duration 3 weeks    PT Treatment/Interventions Neuromuscular re-education;Therapeutic exercise;Therapeutic activities;Functional  mobility training;Balance training;Patient/family education;Manual techniques;Dry needling;Vestibular;Aquatic Therapy;Canalith Repostioning;Electrical Stimulation;Iontophoresis 11m/ml Dexamethasone;Gait training;Stair training    PT Next Visit Plan glute med, max, strunk strengthening, lumbopelvic control. Manual techniques, modalities PRN    Consulted and Agree with Plan of Care Patient           Patient will benefit from skilled therapeutic intervention in order to improve the following deficits and impairments:  Postural dysfunction, Improper body mechanics, Decreased strength, Decreased balance  Visit Diagnosis: Unsteadiness on feet  Muscle weakness (generalized)     Problem List Patient Active Problem List   Diagnosis Date Noted  . Leg weakness 10/13/2019  . Headache 10/10/2019  . Breast tenderness 10/10/2019  . Inversion of right nipple 10/10/2019  . Acid reflux 09/08/2019  . Breast cancer screening 06/08/2019  . Cracked lip 05/25/2019  . SOBOE (shortness of breath on exertion) 05/20/2019  . Nocturia 03/21/2019  . Personal history of kidney stones 03/21/2019  . Depression, major, [place], [place] 07/18[place]2017  . Insomnia secondary to depression with anxiety 02/25/2017  . Adjustment reaction with anxiety and depression 02/26/2016  . Encounter for counseling 12/01/2015  . Microhematuria 03/10/2015  . OSA (obstructive sleep apnea) 01/13/2015  . Nephrolithiasis 12/30/2014  . Recurrent UTI 10/19/2014  . Hyperlipidemia 02/19/2013  . Essential (primary) hypertension 02/19/2013  . Diffuse cystic mastopathy of breast 02/19/2013  . Basal cell carcinoma of skin 02/19/2013  . Hypothyroidism 02/28/2012  . Cyst of ovary 02/19/2009  . Nontoxic multinodular goiter 03/06/2003  . Prediabetes 02/20/2003  . Osteoporosis 02/20/2003  . Osteoarthritis 02/20/2003  . Impaired glucose tolerance 02/20/2003  . Asymptomatic menopausal state 02/20/1984    MJoneen BoersPT, DPT   01/27/2020, 4:11 PM  [facility] [address] [place] [address]Phone: [phone]  Fax:  [phone] Name: Jennifer BARLETTMRN: [id number] of Birth: 8May 28, 1928

## 2020-01-27 NOTE — Telephone Encounter (Signed)
Labs received from Dr Trena Platt office. Ordered future for patient to have done here at her appt on 6/15.

## 2020-01-27 NOTE — Telephone Encounter (Signed)
Called neurology to have labs faxed over so I can order to have done at our office.

## 2020-01-27 NOTE — Addendum Note (Signed)
Addended by: Lars Masson on: 01/27/2020 04:36 PM   Modules accepted: Orders

## 2020-01-27 NOTE — Progress Notes (Signed)
Order placed for serum protein electrophoresis

## 2020-01-28 ENCOUNTER — Telehealth: Payer: Self-pay | Admitting: Internal Medicine

## 2020-01-28 ENCOUNTER — Other Ambulatory Visit: Payer: Self-pay

## 2020-01-28 ENCOUNTER — Other Ambulatory Visit (INDEPENDENT_AMBULATORY_CARE_PROVIDER_SITE_OTHER): Payer: Medicare HMO

## 2020-01-28 DIAGNOSIS — E785 Hyperlipidemia, unspecified: Secondary | ICD-10-CM

## 2020-01-28 DIAGNOSIS — E538 Deficiency of other specified B group vitamins: Secondary | ICD-10-CM | POA: Diagnosis not present

## 2020-01-28 DIAGNOSIS — E519 Thiamine deficiency, unspecified: Secondary | ICD-10-CM

## 2020-01-28 DIAGNOSIS — G43119 Migraine with aura, intractable, without status migrainosus: Secondary | ICD-10-CM

## 2020-01-28 DIAGNOSIS — R2689 Other abnormalities of gait and mobility: Secondary | ICD-10-CM

## 2020-01-28 DIAGNOSIS — G43809 Other migraine, not intractable, without status migrainosus: Secondary | ICD-10-CM

## 2020-01-28 DIAGNOSIS — I10 Essential (primary) hypertension: Secondary | ICD-10-CM

## 2020-01-28 DIAGNOSIS — E531 Pyridoxine deficiency: Secondary | ICD-10-CM | POA: Diagnosis not present

## 2020-01-28 DIAGNOSIS — R300 Dysuria: Secondary | ICD-10-CM

## 2020-01-28 LAB — LIPID PANEL
Cholesterol: 189 mg/dL (ref 0–200)
HDL: 68.3 mg/dL (ref >39.00)
LDL Cholesterol: 90 mg/dL (ref 0–99)
NonHDL: 120.22
Total CHOL/HDL Ratio: 3
Triglycerides: 149 mg/dL (ref 0.0–149.0)
VLDL: 29.8 mg/dL (ref 0.0–40.0)

## 2020-01-28 LAB — URINALYSIS, ROUTINE W REFLEX MICROSCOPIC
Bilirubin Urine: NEGATIVE
Hgb urine dipstick: NEGATIVE
Ketones, ur: NEGATIVE
Nitrite: NEGATIVE
Specific Gravity, Urine: 1.02 (ref 1.000–1.030)
Total Protein, Urine: NEGATIVE
Urine Glucose: NEGATIVE
Urobilinogen, UA: 0.2 (ref 0.0–1.0)
pH: 6.5 (ref 5.0–8.0)

## 2020-01-28 LAB — HEPATIC FUNCTION PANEL
ALT: 14 U/L (ref 0–35)
AST: 21 U/L (ref 0–37)
Albumin: 4 g/dL (ref 3.5–5.2)
Alkaline Phosphatase: 50 U/L (ref 39–117)
Bilirubin, Direct: 0.2 mg/dL (ref 0.0–0.3)
Total Bilirubin: 0.8 mg/dL (ref 0.2–1.2)
Total Protein: 6.5 g/dL (ref 6.0–8.3)

## 2020-01-28 LAB — BASIC METABOLIC PANEL
BUN: 13 mg/dL (ref 6–23)
CO2: 27 mEq/L (ref 19–32)
Calcium: 9.1 mg/dL (ref 8.4–10.5)
Chloride: 106 mEq/L (ref 96–112)
Creatinine, Ser: 0.74 mg/dL (ref 0.40–1.20)
GFR: 73.59 mL/min (ref >60.00)
Glucose, Bld: 97 mg/dL (ref 70–99)
Potassium: 4.1 mEq/L (ref 3.5–5.1)
Sodium: 140 mEq/L (ref 135–145)

## 2020-01-28 LAB — VITAMIN B12: Vitamin B-12: 307 pg/mL (ref 211–911)

## 2020-01-28 LAB — FOLATE: Folate: >24.8 ng/mL (ref >5.9)

## 2020-01-28 LAB — HEMOGLOBIN A1C: Hgb A1c MFr Bld: 6.3 % (ref 4.6–6.5)

## 2020-01-28 LAB — SEDIMENTATION RATE: Sed Rate: 16 mm/hr (ref 0–30)

## 2020-01-28 MED ORDER — PRAVASTATIN SODIUM 40 MG PO TABS: 40.0000 mg | ORAL_TABLET | Freq: Every evening | ORAL | 1 refills | Status: DC

## 2020-01-28 MED ORDER — ESCITALOPRAM OXALATE 10 MG PO TABS: 10.0000 mg | ORAL_TABLET | Freq: Every day | ORAL | 1 refills | Status: DC

## 2020-01-28 NOTE — Telephone Encounter (Signed)
LaToya sent orders to be cosigned.  Received orders for urine and culture with diagnosis of dysuria.  Is pt having problems or acute symptoms?  Need to call pt and confirm doing ok and let her know that I am not in office this week - if something acute going on.  Just want to make sure she is ok.

## 2020-01-28 NOTE — Telephone Encounter (Signed)
Rx refilled.

## 2020-01-28 NOTE — Telephone Encounter (Signed)
Patient called and stated she is not having any acute symptoms just trying to make sure she doesn't have a UTI because she has been having to get up 2 or 3 times a night. Will call her and follow up once results received.

## 2020-01-28 NOTE — Telephone Encounter (Signed)
[employer] faxed over prescription refill on 01/20/20. Patient needs the following refill pravastatin (PRAVACHOL) 40 MG tablet and escitalopram (LEXAPRO) 10 MG tablet. [employer] will fax request over again.

## 2020-01-29 ENCOUNTER — Other Ambulatory Visit: Payer: Self-pay

## 2020-01-29 ENCOUNTER — Ambulatory Visit: Payer: Medicare HMO

## 2020-01-29 DIAGNOSIS — M6281 Muscle weakness (generalized): Secondary | ICD-10-CM | POA: Diagnosis not present

## 2020-01-29 DIAGNOSIS — R2681 Unsteadiness on feet: Secondary | ICD-10-CM | POA: Diagnosis not present

## 2020-01-29 LAB — URINE CULTURE
MICRO NUMBER:: 10592865
SPECIMEN QUALITY:: ADEQUATE

## 2020-01-29 NOTE — Therapy (Signed)
[facility] [address], [place], [address] Phone: [phone]   Fax:  [phone]  Physical Therapy Treatment  Patient Details  Name: Jennifer Bernard MRN: [id number] Date of Birth: 1927/05/11 Referring Provider (PT): Einar Pheasant, MD   Encounter Date: 01/29/2020   PT End of Session - 01/29/20 1527    Visit Number 22    Number of Visits 35    Date for PT Re-Evaluation 02/13/20    Authorization Type 2    Authorization Time Period of 10 progress report    PT Start Time 1527   pt arrived late   PT Stop Time 1559    PT Time Calculation (min) 32 min    Activity Tolerance Patient tolerated treatment well    Behavior During Therapy [facility] for tasks assessed/performed           Past Medical History:  Diagnosis Date  . Asymptomatic menopausal state 02/20/1984   Overview:  Naturally occuring and asymptomatic Vaginal atrophy present on topical estrogen Overview:  Overview:  Naturally occuring and asymptomatic Vaginal atrophy present on topical estrogen  . Basal cell carcinoma of skin 02/19/2013   Overview:  Basal cell on her nasolabial fold 2014 S/p Mohs by Dr. Lacinda Axon Followed by local dermatologist regularly Overview:  Overview:  Followed by Dr. Lacinda Axon  . Cyst of ovary 02/19/2009   Overview:  Ovarian cyst on right stable 2004-2010 Cyst ? larger on CT 2012 Stable on follow up US Followed by Dr. Edwinna Areola Overview:  Overview:  Ovarian cyst on right stable 2004-2010 Cyst ? larger on CT 2012 Stable on follow up US Followed by Dr. Edwinna Areola  . Depression, major, single episode, complete remission ([place]) 07/18/2017  . Diffuse cystic mastopathy of breast 02/19/2013   Overview:  Mother with breast cancer later in life Two breast biopsies both with normal pathology Annual mammograms recommended, last 2016 Followed at the [facility]:  Relevant Hx: Course: Daily Update: Today's Plan: Overview:   Overview:  Mother with breast cancer later in life Two breast biopsies both with normal pathology Annual mammograms recommended, last 46  . Essential (primary) hypertension 02/19/2013   Overview:  Goal blood pressure < 135/85 On atenolol Overview:  Overview:  Goal blood pressure < 135/85 On atenolol  . Hyperlipidemia 02/19/2013   Overview:  Goal LDL < 130, last value 92 in 2017 On Pravachol Overview:  Overview:  Goal LDL < 130, last value 82 in 2012 On Pravachol  . Hypothyroidism 02/28/2012   Overview:  started on T4  TFT normal 2017 Followed by Dr. Almon Hercules Overview:  Overview:  started on T4  TFT normal 2014 Followed by Dr. Almon Hercules  . Impaired glucose tolerance 02/20/2003   Overview:  Overview:  Glucose goal < 100, last value 94 in 2014 HgbA1c goal < 6.0, last value 5.3 in 2014 Urine microalbumin negative in 2014 On diet and exercise  . Nontoxic multinodular goiter 03/06/2003   Overview:  Asymptomatic nodular thyroid Stable [place] 2004-2014 Followed by Dr. Almon Hercules Overview:  Overview:  Asymptomatic nodular thyroid Stable [place] 2004-2014 Followed by Dr. Almon Hercules  . OSA on CPAP 01/13/2015   Overview:  Sleep study sent to medical records:   Sleep efficiency 22%, latency 24 min, # awakenings 11, res desat N=10, index 17.8  Apnea 1 for 16 sec, hypopnea 16 for 18 sec, total sleep time only 90 min so index = 11 She did not sleep  much and the index puts her in mild category Repeat study a few days later when she slept 187 min her AHI = 4.6 which is normal, average O2 sat 94% I don't think   . Osteoarthritis 02/20/2003   Overview:  Mild bilateral hands Right CMC Right hip Left knee s/p meniscus tear All symptomatically stable Overview:  Overview:  Mild bilateral hands Right CMC Right hip Left knee s/p meniscus tear All symptomatically stable  . Osteoporosis 02/20/2003   Overview:  Bone density 2004 LS -3.32 and RH-1.62 Bone density 2008 LS -3.14 and RH -1.22 Bone density 2009 LS -3.17 and RH -1.21 Bone density 2010 LS  -3.14 and RH -1.42  Bone density 2014 LS -2.7 (L3 -3.1) and RH -1.5 Bone density 2017 LS -2.3 and RH -1.4 (neck -1.7) Vitamin D level 38 in 2012 On Boniva 2004 to 2010, restarted 2015 Repeat bone density in 2020 and stop Boniva  Last Assessment & Pl  . Prediabetes 02/20/2003   Overview:  Glucose goal < 100, last value 91 in 2017 HgbA1c goal < 6.0, last value 5.6 in 2017 Urine microalbumin negative in 2017 On diet and exercise  . Recurrent UTI 10/19/2014    Past Surgical History:  Procedure Laterality Date  . ABDOMINAL HYSTERECTOMY  1975  . URETEROSCOPY    . URETEROSCOPY WITH HOLMIUM LASER LITHOTRIPSY      There were no vitals filed for this visit.   Subjective Assessment - 01/29/20 1527    Subjective No pain, just a little crick in her neck but not bad. The manual therapy for the neck helps.    Pertinent History LE weakness, unsteadiness. Symptoms began in the last few months. Gradual onset. Legs feeling shaky and wobbly, especially in the thighs. UE do not feel wobbly. Has not had the problem before. No pain or discomfort. No back pain. No falls in the last 6 months (has fear of falling). Pt currently lives in the [place] [facility] [place]. Does not use an AD. Has not yet had PT before. Exercises about 35 min a day (stationary bike, elliptical, and the Nustep)    Patient Stated Goals Improve strength and balance.    Currently in Pain? Yes    Pain Score 1     Pain Location Neck                                     PT Education - 01/29/20 1833    Education Details ther-ex    Person(s) Educated Patient    Methods Explanation;Demonstration;Tactile cues;Verbal cues    Comprehension Returned demonstration;Verbalized understanding          Objective  No latex band allergies  Blood pressure is controlledper pt  L Pelvic drop during R LE stance phase with unsteadiness dizziness when turning to the R in supine   Usually feels more off balance early in the  morning when getting out of bed. Feels a little"off kilter". Gets out at the R side of the bed.  (dizziness with supine R rotation.) Dix Hallpike (-) bilaterally  MedbridgeAccess Code: [email or web address]     Therapeutic exercise  Side stepping 32 ft to the R and 32 ft to the L 2x to promote glute med muscle strengthening  Tandem walk 10 ft forward 10 ft backward  CGA to min A 2x  Gait throughout gym with changes in gait speed 75 ft  Gait with head turns  R and L 32 ft x 6. Mild gait deviations only.   Stepping over mini hurdle then shoebox, then mini hurdle 6x  Cues for increased step length and foot clearance to step over obstacle.  Difficult for pt but improves with cues and when pt takes her time.    Then stepping over with the R LE 6x. Cues to not perform L latearl lean compensation with throws her off balance. More able to perform steadily after cues.   Improved exercise technique, movement at target joints, use of target muscles after mod verbal, visual, tactile cues.        Pt response/clinical impression:  Pt arrived late so session adjusted accordingly. Difficulty with L LE single leg balance with tendency to perform excessive L lateral lean due to L glute med weakness which causes pt to lose balance when stepping over obstacles. Improved ability to negotiate obstacles when cued to decrease L lateral lean when stepping over with R LE. Pt tolerated session well without aggravation of symptoms. Pt will benefit from continued skilled physical therapy services to improve strength balance, decrease fall risk.       PT Short Term Goals - 11/28/19 1540      PT SHORT TERM GOAL #1   Title Pt will be independent wiht her HEP to improve strength, balance, and function.    Time 3    Period Weeks    Status On-going    Target Date 11/14/19             PT Long Term Goals - 01/20/20 1714      PT LONG TERM GOAL #1   Title Patient will improve  bilateral hip abduction and extension strength to improve steadiness during gait and decrease fall risk.    Time 6    Period Weeks    Status Partially Met    Target Date 02/13/20      PT LONG TERM GOAL #2   Title Patient will improve her Berg Balance score to 48/56 as a demonstration of improved balance.    Baseline 42/56 (10/22/2019); 48/56 (11/28/2019)    Time 8    Period Weeks    Status Achieved      PT LONG TERM GOAL #3   Title Pt will improve DGI score to at least 19 points as a demonstration of improved balance with gait.    Baseline DGI 11 (12/18/2019); 15 (01/20/2020)    Time 3    Period Weeks    Status Partially Met    Target Date 02/13/20                 Plan - 01/29/20 1834    Clinical Impression Statement Pt arrived late so session adjusted accordingly. Difficulty with L LE single leg balance with tendency to perform excessive L lateral lean due to L glute med weakness which causes pt to lose balance when stepping over obstacles. Improved ability to negotiate obstacles when cued to decrease L lateral lean when stepping over with R LE. Pt tolerated session well without aggravation of symptoms. Pt will benefit from continued skilled physical therapy services to improve strength balance, decrease fall risk.    Personal Factors and Comorbidities Age;Comorbidity 3+;Past/Current Experience;Time since onset of injury/illness/exacerbation;Fitness    Comorbidities HTN, OA, osteoporosis    Examination-Activity Limitations Carry;Locomotion Level    Stability/Clinical Decision Making Stable/Uncomplicated    Rehab Potential Fair    PT Frequency 2x / week    PT Duration 3 weeks  PT Treatment/Interventions Neuromuscular re-education;Therapeutic exercise;Therapeutic activities;Functional mobility training;Balance training;Patient/family education;Manual techniques;Dry needling;Vestibular;Aquatic Therapy;Canalith Repostioning;Electrical Stimulation;Iontophoresis 37m/ml Dexamethasone;Gait  training;Stair training    PT Next Visit Plan glute med, max, strunk strengthening, lumbopelvic control. Manual techniques, modalities PRN    Consulted and Agree with Plan of Care Patient           Patient will benefit from skilled therapeutic intervention in order to improve the following deficits and impairments:  Postural dysfunction, Improper body mechanics, Decreased strength, Decreased balance  Visit Diagnosis: Muscle weakness (generalized)  Unsteadiness on feet     Problem List Patient Active Problem List   Diagnosis Date Noted  . Leg weakness 10/13/2019  . Headache 10/10/2019  . Breast tenderness 10/10/2019  . Inversion of right nipple 10/10/2019  . Acid reflux 09/08/2019  . Breast cancer screening 06/08/2019  . Cracked lip 05/25/2019  . SOBOE (shortness of breath on exertion) 05/20/2019  . Nocturia 03/21/2019  . Personal history of kidney stones 03/21/2019  . Depression, major, single episode, complete remission [place] 07/18/2017  . Insomnia secondary to depression with anxiety 02/25/2017  . Adjustment reaction with anxiety and depression 02/26/2016  . Encounter for counseling 12/01/2015  . Microhematuria 03/10/2015  . OSA (obstructive sleep apnea) 01/13/2015  . Nephrolithiasis 12/30/2014  . Recurrent UTI 10/19/2014  . Hyperlipidemia 02/19/2013  . Essential (primary) hypertension 02/19/2013  . Diffuse cystic mastopathy of breast 02/19/2013  . Basal cell carcinoma of skin 02/19/2013  . Hypothyroidism 02/28/2012  . Cyst of ovary 02/19/2009  . Nontoxic multinodular goiter 03/06/2003  . Prediabetes 02/20/2003  . Osteoporosis 02/20/2003  . Osteoarthritis 02/20/2003  . Impaired glucose tolerance 02/20/2003  . Asymptomatic menopausal state 02/20/1984    MJoneen BoersPT, DPT   01/29/2020, 6:38 PM  [facility] [address] [place] [address]Phone: [phone]  Fax:   [phone] Name: Jennifer LENDERMRN: [id number] of Birth: 805/04/1927

## 2020-01-30 ENCOUNTER — Ambulatory Visit: Payer: Medicare HMO | Admitting: Internal Medicine

## 2020-02-03 ENCOUNTER — Ambulatory Visit: Payer: Medicare HMO

## 2020-02-03 ENCOUNTER — Other Ambulatory Visit: Payer: Self-pay

## 2020-02-03 DIAGNOSIS — R2681 Unsteadiness on feet: Secondary | ICD-10-CM

## 2020-02-03 DIAGNOSIS — M6281 Muscle weakness (generalized): Secondary | ICD-10-CM | POA: Diagnosis not present

## 2020-02-03 LAB — PROTEIN ELECTROPHORESIS, SERUM
Albumin ELP: 3.7 g/dL — ABNORMAL LOW (ref 3.8–4.8)
Alpha 1: 0.2 g/dL (ref 0.2–0.3)
Alpha 2: 0.8 g/dL (ref 0.5–0.9)
Beta 2: 0.3 g/dL (ref 0.2–0.5)
Beta Globulin: 0.4 g/dL (ref 0.4–0.6)
Gamma Globulin: 0.7 g/dL — ABNORMAL LOW (ref 0.8–1.7)
Total Protein: 6.1 g/dL (ref 6.1–8.1)

## 2020-02-03 LAB — VITAMIN B6: Vitamin B6: 40.2 ng/mL — ABNORMAL HIGH (ref 2.1–21.7)

## 2020-02-03 LAB — VITAMIN B1: Vitamin B1 (Thiamine): 15 nmol/L (ref 8–30)

## 2020-02-03 NOTE — Patient Instructions (Signed)
Access Code: [email or web address] URL: [email or web address] Date: 02/03/2020 Prepared by: Joneen Boers  Exercises Standing Hip Abduction with Counter Support - 1 x daily - 7 x weekly - 10 reps - 3 sets Seated Flexion Stretch - 1 x daily - 7 x weekly - 1 sets - 10 reps - 5 seconds hold Supine Posterior Pelvic Tilt - 1 x daily - 7 x weekly - 3 sets - 10 reps - 5 seconds hold Hooklying Clamshell with Resistance - 1 x daily - 7 x weekly - 2-3 sets - 10 reps Clamshell - 1 x daily - 7 x weekly - 3 sets - 10 reps Seated Cervical Retraction - 1 x daily - 7 x weekly - 3 sets - 10 reps - 5 seconds hold Sidelying Hip Abduction - 1 x daily - 7 x weekly - 3 sets - 10 reps Standing Single Leg Stance with Counter Support - 1 x daily - 7 x weekly - 3 sets - 10 reps - 5 seconds hold

## 2020-02-03 NOTE — Therapy (Signed)
[facility] [address] [place], [address] Phone: [phone]   Fax:  [phone]  Physical Therapy Treatment  Patient Details  Name: Jennifer Bernard MRN: [id number] Date of Birth: 30-Jan-1927 Referring Provider (PT): Einar Pheasant, MD   Encounter Date: 02/03/2020   PT End of Session - 02/03/20 1520    Visit Number 23    Number of Visits 35    Date for PT Re-Evaluation 02/13/20    Authorization Type 3    Authorization Time Period of 10 progress report    PT Start Time 1519    PT Stop Time 1558    PT Time Calculation (min) 39 min    Activity Tolerance Patient tolerated treatment well    Behavior During Therapy [facility] for tasks assessed/performed           Past Medical History:  Diagnosis Date  . Asymptomatic menopausal state 02/20/1984   Overview:  Naturally occuring and asymptomatic Vaginal atrophy present on topical estrogen Overview:  Overview:  Naturally occuring and asymptomatic Vaginal atrophy present on topical estrogen  . Basal cell carcinoma of skin 02/19/2013   Overview:  Basal cell on her nasolabial fold 2014 S/p Mohs by Dr. Lacinda Axon Followed by local dermatologist regularly Overview:  Overview:  Followed by Dr. Lacinda Axon  . Cyst of ovary 02/19/2009   Overview:  Ovarian cyst on right stable 2004-2010 Cyst ? larger on CT 2012 Stable on follow up US Followed by Dr. Edwinna Areola Overview:  Overview:  Ovarian cyst on right stable 2004-2010 Cyst ? larger on CT 2012 Stable on follow up US Followed by Dr. Edwinna Areola  . Depression, major, single episode, complete remission ([place]) 07/18/2017  . Diffuse cystic mastopathy of breast 02/19/2013   Overview:  Mother with breast cancer later in life Two breast biopsies both with normal pathology Annual mammograms recommended, last 2016 Followed at the [place]:  Relevant Hx: Course: Daily Update: Today's Plan: Overview:  Overview:   Mother with breast cancer later in life Two breast biopsies both with normal pathology Annual mammograms recommended, last 65  . Essential (primary) hypertension 02/19/2013   Overview:  Goal blood pressure < 135/85 On atenolol Overview:  Overview:  Goal blood pressure < 135/85 On atenolol  . Hyperlipidemia 02/19/2013   Overview:  Goal LDL < 130, last value 92 in 2017 On Pravachol Overview:  Overview:  Goal LDL < 130, last value 82 in 2012 On Pravachol  . Hypothyroidism 02/28/2012   Overview:  started on T4  TFT normal 2017 Followed by Dr. Almon Hercules Overview:  Overview:  started on T4  TFT normal 2014 Followed by Dr. Almon Hercules  . Impaired glucose tolerance 02/20/2003   Overview:  Overview:  Glucose goal < 100, last value 94 in 2014 HgbA1c goal < 6.0, last value 5.3 in 2014 Urine microalbumin negative in 2014 On diet and exercise  . Nontoxic multinodular goiter 03/06/2003   Overview:  Asymptomatic nodular thyroid Stable [place] 2004-2014 Followed by Dr. Almon Hercules Overview:  Overview:  Asymptomatic nodular thyroid Stable [place] 2004-2014 Followed by Dr. Almon Hercules  . OSA on CPAP 01/13/2015   Overview:  Sleep study sent to medical records:   Sleep efficiency 22%, latency 24 min, # awakenings 11, res desat N=10, index 17.8  Apnea 1 for 16 sec, hypopnea 16 for 18 sec, total sleep time only 90 min so index = 11 She did not sleep much and the index  puts her in mild category Repeat study a few days later when she slept 187 min her AHI = 4.6 which is normal, average O2 sat 94% I don't think   . Osteoarthritis 02/20/2003   Overview:  Mild bilateral hands Right CMC Right hip Left knee s/p meniscus tear All symptomatically stable Overview:  Overview:  Mild bilateral hands Right CMC Right hip Left knee s/p meniscus tear All symptomatically stable  . Osteoporosis 02/20/2003   Overview:  Bone density 2004 LS -3.32 and RH-1.62 Bone density 2008 LS -3.14 and RH -1.22 Bone density 2009 LS -3.17 and RH -1.21 Bone density 2010 LS -3.14 and RH  -1.42  Bone density 2014 LS -2.7 (L3 -3.1) and RH -1.5 Bone density 2017 LS -2.3 and RH -1.4 (neck -1.7) Vitamin D level 38 in 2012 On Boniva 2004 to 2010, restarted 2015 Repeat bone density in 2020 and stop Boniva  Last Assessment & Pl  . Prediabetes 02/20/2003   Overview:  Glucose goal < 100, last value 91 in 2017 HgbA1c goal < 6.0, last value 5.6 in 2017 Urine microalbumin negative in 2017 On diet and exercise  . Recurrent UTI 10/19/2014    Past Surgical History:  Procedure Laterality Date  . ABDOMINAL HYSTERECTOMY  1975  . URETEROSCOPY    . URETEROSCOPY WITH HOLMIUM LASER LITHOTRIPSY      There were no vitals filed for this visit.   Subjective Assessment - 02/03/20 1521    Subjective No pain or discomfort currently    Pertinent History LE weakness, unsteadiness. Symptoms began in the last few months. Gradual onset. Legs feeling shaky and wobbly, especially in the thighs. UE do not feel wobbly. Has not had the problem before. No pain or discomfort. No back pain. No falls in the last 6 months (has fear of falling). Pt currently lives in the [place] [facility] [place]. Does not use an AD. Has not yet had PT before. Exercises about 35 min a day (stationary bike, elliptical, and the Nustep)    Patient Stated Goals Improve strength and balance.    Currently in Pain? No/denies                                     PT Education - 02/03/20 1524    Education Details ther-ex, HEP    Person(s) Educated Patient    Methods Explanation;Demonstration;Tactile cues;Verbal cues;Handout    Comprehension Returned demonstration;Verbalized understanding              Objective  No latex band allergies  Blood pressure is controlledper pt  L Pelvic drop during R LE stance phase with unsteadiness dizziness when turning to the R in supine   Usually feels more off balance early in the morning when getting out of bed. Feels a little"off kilter". Gets out at the R side of the  bed.  (dizziness with supine R rotation.) Dix Hallpike (-) bilaterally  MedbridgeAccess Code: [email or web address]     Therapeutic exercise  S/L hip abduction   R 10x3  L 10x3  SLS with light touch assist   L 10x5 seconds for 3 sets   R 10x5 seconds for 3 sets   Stepping over 4 mini hurdles 6x. Unsteady at times  Stepping over a shoe box    R 10x  L 10x   LOB 2x (1x each LE) with pt using table for assist. Overall able to step over without LOB   the rest of the time    Side stepping 32 ft to the R and 32 ft to the L 2x to promote glute med muscle strengthening  Standing tandem stand balance CGA to min A with light touch assist as needed  R foot in front 30 seconds x 2  L foot in front 30 seconds x 2  Improved exercise technique, movement at target joints, use of target muscles after mod verbal, visual, tactile cues.        Pt response/clinical impression: Pt tolerated session well without aggravation of symptoms. Continued working on glute med strengthening to promote single leg balance when stepping over obstacles. Pt will benefit from continued skilled physical therapy services to improve strength, balance and function.        PT Short Term Goals - 11/28/19 1540      PT SHORT TERM GOAL #1   Title Pt will be independent wiht her HEP to improve strength, balance, and function.    Time 3    Period Weeks    Status On-going    Target Date 11/14/19             PT Long Term Goals - 01/20/20 1714      PT LONG TERM GOAL #1   Title Patient will improve bilateral hip abduction and extension strength to improve steadiness during gait and decrease fall risk.    Time 6    Period Weeks    Status Partially Met    Target Date 02/13/20      PT LONG TERM GOAL #2   Title Patient will improve her Berg Balance score to 48/56 as a demonstration of improved balance.    Baseline 42/56 (10/22/2019); 48/56 (11/28/2019)    Time 8    Period Weeks    Status  Achieved      PT LONG TERM GOAL #3   Title Pt will improve DGI score to at least 19 points as a demonstration of improved balance with gait.    Baseline DGI 11 (12/18/2019); 15 (01/20/2020)    Time 3    Period Weeks    Status Partially Met    Target Date 02/13/20                 Plan - 02/03/20 1525    Clinical Impression Statement Pt tolerated session well without aggravation of symptoms. Continued working on glute med strengthening to promote single leg balance when stepping over obstacles. Pt will benefit from continued skilled physical therapy services to improve strength, balance and function.    Personal Factors and Comorbidities Age;Comorbidity 3+;Past/Current Experience;Time since onset of injury/illness/exacerbation;Fitness    Comorbidities HTN, OA, osteoporosis    Examination-Activity Limitations Carry;Locomotion Level    Stability/Clinical Decision Making Stable/Uncomplicated    Rehab Potential Fair    PT Frequency 2x / week    PT Duration 3 weeks    PT Treatment/Interventions Neuromuscular re-education;Therapeutic exercise;Therapeutic activities;Functional mobility training;Balance training;Patient/family education;Manual techniques;Dry needling;Vestibular;Aquatic Therapy;Canalith Repostioning;Electrical Stimulation;Iontophoresis 84m/ml Dexamethasone;Gait training;Stair training    PT Next Visit Plan glute med, max, strunk strengthening, lumbopelvic control. Manual techniques, modalities PRN    Consulted and Agree with Plan of Care Patient           Patient will benefit from skilled therapeutic intervention in order to improve the following deficits and impairments:  Postural dysfunction, Improper body mechanics, Decreased strength, Decreased balance  Visit Diagnosis: Muscle weakness (generalized)  Unsteadiness on feet     Problem List Patient Active Problem  List   Diagnosis Date Noted  . Leg weakness 10/13/2019  . Headache 10/10/2019  . Breast tenderness  10/10/2019  . Inversion of right nipple 10/10/2019  . Acid reflux 09/08/2019  . Breast cancer screening 06/08/2019  . Cracked lip 05/25/2019  . SOBOE (shortness of breath on exertion) 05/20/2019  . Nocturia 03/21/2019  . Personal history of kidney stones 03/21/2019  . Depression, major, single episode, complete remission ([facility]) 07/18/2017  . Insomnia secondary to depression with anxiety 02/25/2017  . Adjustment reaction with anxiety and depression 02/26/2016  . Encounter for counseling 12/01/2015  . Microhematuria 03/10/2015  . OSA (obstructive sleep apnea) 01/13/2015  . Nephrolithiasis 12/30/2014  . Recurrent UTI 10/19/2014  . Hyperlipidemia 02/19/2013  . Essential (primary) hypertension 02/19/2013  . Diffuse cystic mastopathy of breast 02/19/2013  . Basal cell carcinoma of skin 02/19/2013  . Hypothyroidism 02/28/2012  . Cyst of ovary 02/19/2009  . Nontoxic multinodular goiter 03/06/2003  . Prediabetes 02/20/2003  . Osteoporosis 02/20/2003  . Osteoarthritis 02/20/2003  . Impaired glucose tolerance 02/20/2003  . Asymptomatic menopausal state 02/20/1984    Miguel Laygo PT, DPT   02/03/2020, 7:26 PM  [facility] [address] [place], [place], [address] Phone: [phone]   Fax:  [phone]  Name: Jennifer Bernard MRN: [id number] Date of Birth: 07/04/1927   

## 2020-02-04 LAB — IFE, PE AND FLC, SERUM
Albumin SerPl Elph-Mcnc: 3.6 g/dL (ref 2.9–4.4)
Albumin/Glob SerPl: 1.3 (ref 0.7–1.7)
Alpha 1: 0.3 g/dL (ref 0.0–0.4)
Alpha2 Glob SerPl Elph-Mcnc: 0.8 g/dL (ref 0.4–1.0)
B-Globulin SerPl Elph-Mcnc: 1.2 g/dL (ref 0.7–1.3)
Gamma Glob SerPl Elph-Mcnc: 0.7 g/dL (ref 0.4–1.8)
Globulin, Total: 2.9 g/dL (ref 2.2–3.9)
Ig Kappa Free Light Chain: 19 mg/L (ref 3.3–19.4)
Ig Lambda Free Light Chain: 14.8 mg/L (ref 5.7–26.3)
IgA/Immunoglobulin A, Serum: 334 mg/dL (ref 64–422)
IgG (Immunoglobin G), Serum: 653 mg/dL (ref 586–1602)
IgM (Immunoglobulin M), Srm: 106 mg/dL (ref 26–217)
KAPPA/LAMBDA RATIO: 1.28 (ref 0.26–1.65)
Total Protein: 6.5 g/dL (ref 6.0–8.5)

## 2020-02-05 ENCOUNTER — Ambulatory Visit: Payer: Medicare HMO

## 2020-02-05 ENCOUNTER — Other Ambulatory Visit: Payer: Self-pay

## 2020-02-05 DIAGNOSIS — R2681 Unsteadiness on feet: Secondary | ICD-10-CM

## 2020-02-05 DIAGNOSIS — M6281 Muscle weakness (generalized): Secondary | ICD-10-CM | POA: Diagnosis not present

## 2020-02-05 NOTE — Therapy (Signed)
[facility] [address] [place], [address] Phone: [phone]   Fax:  [phone]  Physical Therapy Treatment  Patient Details  Name: Jennifer Bernard MRN: [id number] Date of Birth: Jun 20, 1927 Referring Provider (PT): Einar Pheasant, MD   Encounter Date: 02/05/2020   PT End of Session - 02/05/20 1352    Visit Number 24    Number of Visits 35    Date for PT Re-Evaluation 02/13/20    Authorization Type 4    Authorization Time Period of 10 progress report    PT Start Time 1352   pt arrived late   PT Stop Time 1431    PT Time Calculation (min) 39 min    Activity Tolerance Patient tolerated treatment well    Behavior During Therapy [facility] for tasks assessed/performed           Past Medical History:  Diagnosis Date  . Asymptomatic menopausal state 02/20/1984   Overview:  Naturally occuring and asymptomatic Vaginal atrophy present on topical estrogen Overview:  Overview:  Naturally occuring and asymptomatic Vaginal atrophy present on topical estrogen  . Basal cell carcinoma of skin 02/19/2013   Overview:  Basal cell on her nasolabial fold 2014 S/p Mohs by Dr. Lacinda Axon Followed by local dermatologist regularly Overview:  Overview:  Followed by Dr. Lacinda Axon  . Cyst of ovary 02/19/2009   Overview:  Ovarian cyst on right stable 2004-2010 Cyst ? larger on CT 2012 Stable on follow up US Followed by Dr. Edwinna Areola Overview:  Overview:  Ovarian cyst on right stable 2004-2010 Cyst ? larger on CT 2012 Stable on follow up US Followed by Dr. Edwinna Areola  . Depression, major, single episode, complete remission ([place]) 07/18/2017  . Diffuse cystic mastopathy of breast 02/19/2013   Overview:  Mother with breast cancer later in life Two breast biopsies both with normal pathology Annual mammograms recommended, last 2016 Followed at the [facility]:  Relevant Hx: Course: Daily Update: Today's Plan: Overview:   Overview:  Mother with breast cancer later in life Two breast biopsies both with normal pathology Annual mammograms recommended, last 40  . Essential (primary) hypertension 02/19/2013   Overview:  Goal blood pressure < 135/85 On atenolol Overview:  Overview:  Goal blood pressure < 135/85 On atenolol  . Hyperlipidemia 02/19/2013   Overview:  Goal LDL < 130, last value 92 in 2017 On Pravachol Overview:  Overview:  Goal LDL < 130, last value 82 in 2012 On Pravachol  . Hypothyroidism 02/28/2012   Overview:  started on T4  TFT normal 2017 Followed by Dr. Almon Hercules Overview:  Overview:  started on T4  TFT normal 2014 Followed by Dr. Almon Hercules  . Impaired glucose tolerance 02/20/2003   Overview:  Overview:  Glucose goal < 100, last value 94 in 2014 HgbA1c goal < 6.0, last value 5.3 in 2014 Urine microalbumin negative in 2014 On diet and exercise  . Nontoxic multinodular goiter 03/06/2003   Overview:  Asymptomatic nodular thyroid Stable [place] 2004-2014 Followed by Dr. Almon Hercules Overview:  Overview:  Asymptomatic nodular thyroid Stable [place] 2004-2014 Followed by Dr. Almon Hercules  . OSA on CPAP 01/13/2015   Overview:  Sleep study sent to medical records:   Sleep efficiency 22%, latency 24 min, # awakenings 11, res desat N=10, index 17.8  Apnea 1 for 16 sec, hypopnea 16 for 18 sec, total sleep time only 90 min so index = 11 She did not sleep  much and the index puts her in mild category Repeat study a few days later when she slept 187 min her AHI = 4.6 which is normal, average O2 sat 94% I don't think   . Osteoarthritis 02/20/2003   Overview:  Mild bilateral hands Right CMC Right hip Left knee s/p meniscus tear All symptomatically stable Overview:  Overview:  Mild bilateral hands Right CMC Right hip Left knee s/p meniscus tear All symptomatically stable  . Osteoporosis 02/20/2003   Overview:  Bone density 2004 LS -3.32 and RH-1.62 Bone density 2008 LS -3.14 and RH -1.22 Bone density 2009 LS -3.17 and RH -1.21 Bone density 2010 LS  -3.14 and RH -1.42  Bone density 2014 LS -2.7 (L3 -3.1) and RH -1.5 Bone density 2017 LS -2.3 and RH -1.4 (neck -1.7) Vitamin D level 38 in 2012 On Boniva 2004 to 2010, restarted 2015 Repeat bone density in 2020 and stop Boniva  Last Assessment & Pl  . Prediabetes 02/20/2003   Overview:  Glucose goal < 100, last value 91 in 2017 HgbA1c goal < 6.0, last value 5.6 in 2017 Urine microalbumin negative in 2017 On diet and exercise  . Recurrent UTI 10/19/2014    Past Surgical History:  Procedure Laterality Date  . ABDOMINAL HYSTERECTOMY  1975  . URETEROSCOPY    . URETEROSCOPY WITH HOLMIUM LASER LITHOTRIPSY      There were no vitals filed for this visit.   Subjective Assessment - 02/05/20 1354    Subjective Has a little soreness R lateral hip in standing, 3-4/10 when walking. Legs still feel wobbly, no particular time.    Pertinent History LE weakness, unsteadiness. Symptoms began in the last few months. Gradual onset. Legs feeling shaky and wobbly, especially in the thighs. UE do not feel wobbly. Has not had the problem before. No pain or discomfort. No back pain. No falls in the last 6 months (has fear of falling). Pt currently lives in the [place] at [place]. Does not use an AD. Has not yet had PT before. Exercises about 35 min a day (stationary bike, elliptical, and the Nustep)    Patient Stated Goals Improve strength and balance.    Currently in Pain? Yes    Pain Score 4     Pain Location Hip    Pain Orientation Right                                     PT Education - 02/05/20 1357    Education Details ther-ex    Person(s) Educated Patient    Methods Explanation;Demonstration;Tactile cues;Verbal cues    Comprehension Verbalized understanding;Returned demonstration             Objective  No latex band allergies  Blood pressure is controlledper pt  L Pelvic drop during R LE stance phase with unsteadiness dizziness when turning to the R in  supine   Usually feels more off balance early in the morning when getting out of bed. Feels a little"off kilter". Gets out at the R side of the bed.  (dizziness with supine R rotation.) Dix Hallpike (-) bilaterally  MedbridgeAccess Code: [email or web address]     Therapeutic exercise  Seated physioball rolls  Forward 10x5 seconds for 2 sets  To the L 10x5 seconds for 2 sets   standign L trunk side bend to decrease R Low back pressure  10x5 seconds  Seated R hip extension isometrics 10x5  seconds for 3 sets  Seated trunk flexion isometrics with PT manual resistance 10x5 seconds for 3 sets   SLS with light touch assist          L 10x10 seconds          R 10x10 seconds   Forward step up onto and over Air Ex Pad 8x each LE  Then 6x R LE  Static standing balance on Air ex pad eyes open 10 seconds   Then eyes closed, feet shoulder width apart 10 seconds x 5   Check hip IR on L next visit if appropriate    Improved exercise technique, movement at target joints, use of target muscles after mod verbal, visual, tactile cues.       Pt response/clinical impression: Pt demonstrates R lateral and backward lean during R LE stance phase of gait. Worked on improving glute med and trunk strength to decrease pressure to R low back as well as movements to decrease pressure. Also worked on single leg balance as well as balance on unven surface to help decrease fall risk. No back and lateral hip pain after session. Pt will benefit from continued skilled physical therapy services to decrease pain, improve strength, balance, function, and decrease fall risk.     PT Short Term Goals - 11/28/19 1540      PT SHORT TERM GOAL #1   Title Pt will be independent wiht her HEP to improve strength, balance, and function.    Time 3    Period Weeks    Status On-going    Target Date 11/14/19             PT Long Term Goals - 01/20/20 1714      PT LONG TERM GOAL #1    Title Patient will improve bilateral hip abduction and extension strength to improve steadiness during gait and decrease fall risk.    Time 6    Period Weeks    Status Partially Met    Target Date 02/13/20      PT LONG TERM GOAL #2   Title Patient will improve her Berg Balance score to 48/56 as a demonstration of improved balance.    Baseline 42/56 (10/22/2019); 48/56 (11/28/2019)    Time 8    Period Weeks    Status Achieved      PT LONG TERM GOAL #3   Title Pt will improve DGI score to at least 19 points as a demonstration of improved balance with gait.    Baseline DGI 11 (12/18/2019); 15 (01/20/2020)    Time 3    Period Weeks    Status Partially Met    Target Date 02/13/20                 Plan - 02/05/20 1352    Clinical Impression Statement Pt demonstrates R lateral and backward lean during R LE stance phase of gait. Worked on improving glute med and trunk strength to decrease pressure to R low back as well as movements to decrease pressure. Also worked on single leg balance as well as balance on unven surface to help decrease fall risk. No back and lateral hip pain after session. Pt will benefit from continued skilled physical therapy services to decrease pain, improve strength, balance, function, and decrease fall risk.    Personal Factors and Comorbidities Age;Comorbidity 3+;Past/Current Experience;Time since onset of injury/illness/exacerbation;Fitness    Comorbidities HTN, OA, osteoporosis    Examination-Activity Limitations Carry;Locomotion Level    Stability/Clinical Decision  Making Stable/Uncomplicated    Rehab Potential Fair    PT Frequency 2x / week    PT Duration 3 weeks    PT Treatment/Interventions Neuromuscular re-education;Therapeutic exercise;Therapeutic activities;Functional mobility training;Balance training;Patient/family education;Manual techniques;Dry needling;Vestibular;Aquatic Therapy;Canalith Repostioning;Electrical Stimulation;Iontophoresis 86m/ml  Dexamethasone;Gait training;Stair training    PT Next Visit Plan glute med, max, strunk strengthening, lumbopelvic control. Manual techniques, modalities PRN    Consulted and Agree with Plan of Care Patient           Patient will benefit from skilled therapeutic intervention in order to improve the following deficits and impairments:  Postural dysfunction, Improper body mechanics, Decreased strength, Decreased balance  Visit Diagnosis: Muscle weakness (generalized)  Unsteadiness on feet     Problem List Patient Active Problem List   Diagnosis Date Noted  . Leg weakness 10/13/2019  . Headache 10/10/2019  . Breast tenderness 10/10/2019  . Inversion of right nipple 10/10/2019  . Acid reflux 09/08/2019  . Breast cancer screening 06/08/2019  . Cracked lip 05/25/2019  . SOBOE (shortness of breath on exertion) 05/20/2019  . Nocturia 03/21/2019  . Personal history of kidney stones 03/21/2019  . Depression, major, single episode, complete remission ([place] 07/18/2017  . Insomnia secondary to depression with anxiety 02/25/2017  . Adjustment reaction with anxiety and depression 02/26/2016  . Encounter for counseling 12/01/2015  . Microhematuria 03/10/2015  . OSA (obstructive sleep apnea) 01/13/2015  . Nephrolithiasis 12/30/2014  . Recurrent UTI 10/19/2014  . Hyperlipidemia 02/19/2013  . Essential (primary) hypertension 02/19/2013  . Diffuse cystic mastopathy of breast 02/19/2013  . Basal cell carcinoma of skin 02/19/2013  . Hypothyroidism 02/28/2012  . Cyst of ovary 02/19/2009  . Nontoxic multinodular goiter 03/06/2003  . Prediabetes 02/20/2003  . Osteoporosis 02/20/2003  . Osteoarthritis 02/20/2003  . Impaired glucose tolerance 02/20/2003  . Asymptomatic menopausal state 02/20/1984    MJoneen BoersPT, DPT  02/05/2020, 10:09 PM  [facility] [address] [place] [address]Phone: [phone]  Fax:   [phone] Name: Jennifer DALOIAMRN: [id number] of Birth: 804-28-1928

## 2020-02-10 ENCOUNTER — Encounter: Payer: Self-pay | Admitting: Internal Medicine

## 2020-02-10 ENCOUNTER — Other Ambulatory Visit: Payer: Self-pay

## 2020-02-10 ENCOUNTER — Ambulatory Visit (INDEPENDENT_AMBULATORY_CARE_PROVIDER_SITE_OTHER): Payer: Medicare HMO | Admitting: Internal Medicine

## 2020-02-10 DIAGNOSIS — R29898 Other symptoms and signs involving the musculoskeletal system: Secondary | ICD-10-CM

## 2020-02-10 DIAGNOSIS — F325 Major depressive disorder, single episode, in full remission: Secondary | ICD-10-CM

## 2020-02-10 DIAGNOSIS — E785 Hyperlipidemia, unspecified: Secondary | ICD-10-CM | POA: Diagnosis not present

## 2020-02-10 DIAGNOSIS — R21 Rash and other nonspecific skin eruption: Secondary | ICD-10-CM | POA: Diagnosis not present

## 2020-02-10 DIAGNOSIS — R519 Headache, unspecified: Secondary | ICD-10-CM

## 2020-02-10 DIAGNOSIS — E039 Hypothyroidism, unspecified: Secondary | ICD-10-CM | POA: Diagnosis not present

## 2020-02-10 DIAGNOSIS — I10 Essential (primary) hypertension: Secondary | ICD-10-CM

## 2020-02-10 MED ORDER — AMLODIPINE BESYLATE 2.5 MG PO TABS
2.5000 mg | ORAL_TABLET | Freq: Every day | ORAL | 2 refills | Status: DC
Start: 2020-02-10 — End: 2020-03-11

## 2020-02-10 MED ORDER — NYSTATIN 100000 UNIT/GM EX CREA: 1.0000 "application " | TOPICAL_CREAM | Freq: Two times a day (BID) | CUTANEOUS | 0 refills | Status: DC

## 2020-02-10 NOTE — Progress Notes (Signed)
Patient ID: Jennifer Bernard, female   DOB: September 05, 1926, 86 y.o.   MRN: [id number]   Subjective:    Patient ID: Jennifer Bernard, female    DOB: 06-Sep-1926, 86 y.o.   MRN: [id number]  HPI This visit occurred during the SARS-CoV-2 public health emergency.  Safety protocols were in place, including screening questions prior to the visit, additional usage of staff PPE, and extensive cleaning of exam room while observing appropriate contact time as indicated for disinfecting solutions.  Patient here for a scheduled follow up.  She reports she is doing relatively well. Saw dermatology recently.  Had lesion removed - adjacent to her eyebrow.  Healing well.  Has noticed some redness under her breast.  Discussed possible yeast.  No chest pain or sob reported.  No acid reflux or abdominal pain reported.  Tries to stay active.  Seeing PT for weakness and unsteadiness.  Seeing neurology for headaches.  Recommended neck exercises and magnesium.  Also recommended oral B12.  Handling stress.    Past Medical History:  Diagnosis Date  . Asymptomatic menopausal state 02/20/1984   Overview:  Naturally occuring and asymptomatic Vaginal atrophy present on topical estrogen Overview:  Overview:  Naturally occuring and asymptomatic Vaginal atrophy present on topical estrogen  . Basal cell carcinoma of skin 02/19/2013   Overview:  Basal cell on her nasolabial fold 2014 S/p Mohs by Dr. Lacinda Axon Followed by local dermatologist regularly Overview:  Overview:  Followed by Dr. Lacinda Axon  . Cyst of ovary 02/19/2009   Overview:  Ovarian cyst on right stable 2004-2010 Cyst ? larger on CT 2012 Stable on follow up US Followed by Dr. Edwinna Areola Overview:  Overview:  Ovarian cyst on right stable 2004-2010 Cyst ? larger on CT 2012 Stable on follow up US Followed by Dr. Edwinna Areola  . Depression, major, single episode, complete remission ([place]) 07/18/2017  . Diffuse cystic mastopathy of breast 02/19/2013   Overview:  Mother with breast cancer  later in life Two breast biopsies both with normal pathology Annual mammograms recommended, last 2016 Followed at the [facility]:  Relevant Hx: Course: Daily Update: Today's Plan: Overview:  Overview:  Mother with breast cancer later in life Two breast biopsies both with normal pathology Annual mammograms recommended, last 51  . Essential (primary) hypertension 02/19/2013   Overview:  Goal blood pressure < 135/85 On atenolol Overview:  Overview:  Goal blood pressure < 135/85 On atenolol  . Hyperlipidemia 02/19/2013   Overview:  Goal LDL < 130, last value 92 in 2017 On Pravachol Overview:  Overview:  Goal LDL < 130, last value 82 in 2012 On Pravachol  . Hypothyroidism 02/28/2012   Overview:  started on T4  TFT normal 2017 Followed by Dr. Almon Hercules Overview:  Overview:  started on T4  TFT normal 2014 Followed by Dr. Almon Hercules  . Impaired glucose tolerance 02/20/2003   Overview:  Overview:  Glucose goal < 100, last value 94 in 2014 HgbA1c goal < 6.0, last value 5.3 in 2014 Urine microalbumin negative in 2014 On diet and exercise  . Nontoxic multinodular goiter 03/06/2003   Overview:  Asymptomatic nodular thyroid Stable [place] 2004-2014 Followed by Dr. Almon Hercules Overview:  Overview:  Asymptomatic nodular thyroid Stable [place] 2004-2014 Followed by Dr. Almon Hercules  . OSA on CPAP 01/13/2015   Overview:  Sleep study sent to medical records:   Sleep efficiency 22%, latency 24 min, # awakenings 11, res desat N=10, index 17.8  Apnea 1 for  16 sec, hypopnea 16 for 18 sec, total sleep time only 90 min so index = 11 She did not sleep much and the index puts her in mild category Repeat study a few days later when she slept 187 min her AHI = 4.6 which is normal, average O2 sat 94% I don't think   . Osteoarthritis 02/20/2003   Overview:  Mild bilateral hands Right CMC Right hip Left knee s/p meniscus tear All symptomatically stable Overview:  Overview:  Mild bilateral hands Right CMC Right hip Left knee s/p  meniscus tear All symptomatically stable  . Osteoporosis 02/20/2003   Overview:  Bone density 2004 LS -3.32 and RH-1.62 Bone density 2008 LS -3.14 and RH -1.22 Bone density 2009 LS -3.17 and RH -1.21 Bone density 2010 LS -3.14 and RH -1.42  Bone density 2014 LS -2.7 (L3 -3.1) and RH -1.5 Bone density 2017 LS -2.3 and RH -1.4 (neck -1.7) Vitamin D level 38 in 2012 On Boniva 2004 to 2010, restarted 2015 Repeat bone density in 2020 and stop Boniva  Last Assessment & Pl  . Prediabetes 02/20/2003   Overview:  Glucose goal < 100, last value 91 in 2017 HgbA1c goal < 6.0, last value 5.6 in 2017 Urine microalbumin negative in 2017 On diet and exercise  . Recurrent UTI 10/19/2014   Past Surgical History:  Procedure Laterality Date  . ABDOMINAL HYSTERECTOMY  1975  . URETEROSCOPY    . URETEROSCOPY WITH HOLMIUM LASER LITHOTRIPSY     Family History  Problem Relation Age of Onset  . Breast cancer Mother   . Skin cancer Father    Social History   Socioeconomic History  . Marital status: Widowed    Spouse name: Not on file  . Number of children: Not on file  . Years of education: Not on file  . Highest education level: Not on file  Occupational History  . Not on file  Tobacco Use  . Smoking status: Never Smoker  . Smokeless tobacco: Never Used  Vaping Use  . Vaping Use: Never used  Substance and Sexual Activity  . Alcohol use: No  . Drug use: No  . Sexual activity: Not Currently    Birth control/protection: Post-menopausal  Other Topics Concern  . Not on file  Social History Narrative  . Not on file   Social Determinants of Health   Financial Resource Strain:   . Difficulty of Paying Living Expenses:   Food Insecurity:   . Worried About Charity fundraiser in the Last Year:   . Arboriculturist in the Last Year:   Transportation Needs:   . Film/video editor (Medical):   Marland Kitchen Lack of Transportation (Non-Medical):   Physical Activity:   . Days of Exercise per Week:   . Minutes of  Exercise per Session:   Stress:   . Feeling of Stress :   Social Connections:   . Frequency of Communication with Friends and Family:   . Frequency of Social Gatherings with Friends and Family:   . Attends Religious Services:   . Active Member of Clubs or Organizations:   . Attends Archivist Meetings:   Marland Kitchen Marital Status:     Outpatient Encounter Medications as of 02/10/2020  Medication Sig  . amLODipine (NORVASC) 2.5 MG tablet Take 1 tablet (2.5 mg total) by mouth daily.  Marland Kitchen escitalopram (LEXAPRO) 10 MG tablet Take 1 tablet (10 mg total) by mouth daily.  Marland Kitchen levothyroxine (SYNTHROID, LEVOTHROID) 75 MCG tablet   .  mometasone (ELOCON) 0.1 % cream   . Multiple Vitamin (MULTIVITAMIN) capsule Take by mouth.  . nystatin cream (MYCOSTATIN) Apply 1 application topically 2 (two) times daily.  . pravastatin (PRAVACHOL) 40 MG tablet Take 1 tablet (40 mg total) by mouth every evening.  . trospium (SANCTURA) 20 MG tablet Take 1 tablet (20 mg total) by mouth at bedtime. (Patient not taking: Reported on 10/22/2019)   No facility-administered encounter medications on file as of 02/10/2020.    Review of Systems  Constitutional: Negative for appetite change and unexpected weight change.  HENT: Negative for congestion and sinus pressure.   Respiratory: Negative for cough, chest tightness and shortness of breath.   Cardiovascular: Negative for chest pain, palpitations and leg swelling.  Gastrointestinal: Negative for abdominal pain, diarrhea, nausea and vomiting.  Genitourinary: Negative for difficulty urinating and dysuria.  Musculoskeletal: Negative for joint swelling and myalgias.  Skin: Negative for color change and rash.  Neurological: Positive for headaches. Negative for dizziness and light-headedness.  Psychiatric/Behavioral: Negative for agitation and dysphoric mood.       Objective:    Physical Exam Vitals reviewed.  Constitutional:      General: She is not in acute distress.     Appearance: Normal appearance.  HENT:     Head: Normocephalic and atraumatic.     Right Ear: External ear normal.     Left Ear: External ear normal.  Eyes:     General: No scleral icterus.       Right eye: No discharge.        Left eye: No discharge.     Conjunctiva/sclera: Conjunctivae normal.  Neck:     Thyroid: No thyromegaly.  Cardiovascular:     Rate and Rhythm: Normal rate and regular rhythm.  Pulmonary:     Effort: No respiratory distress.     Breath sounds: Normal breath sounds. No wheezing.  Abdominal:     General: Bowel sounds are normal.     Palpations: Abdomen is soft.     Tenderness: There is no abdominal tenderness.  Musculoskeletal:        General: No swelling or tenderness.     Cervical back: Neck supple. No tenderness.  Lymphadenopathy:     Cervical: No cervical adenopathy.  Skin:    Findings: No erythema or rash.  Neurological:     Mental Status: She is alert.  Psychiatric:        Mood and Affect: Mood normal.        Behavior: Behavior normal.     BP 138/78   Pulse 89   Temp 98.2 F (36.8 C)   Resp 16   Ht 5\' 1"  (1.549 m)   Wt 145 lb (65.8 kg)   SpO2 96%   BMI 27.40 kg/m  Wt Readings from Last 3 Encounters:  02/10/20 145 lb (65.8 kg)  10/30/19 143 lb (64.9 kg)  10/21/19 145 lb (65.8 kg)     Lab Results  Component Value Date   WBC 7.3 10/10/2019   HGB 15.2 (H) 10/10/2019   HCT 45.8 10/10/2019   PLT 232.0 10/10/2019   GLUCOSE 97 01/28/2020   CHOL 189 01/28/2020   TRIG 149.0 01/28/2020   HDL 68.30 01/28/2020   LDLCALC 90 01/28/2020   ALT 14 01/28/2020   AST 21 01/28/2020   NA 140 01/28/2020   K 4.1 01/28/2020   CL 106 01/28/2020   CREATININE 0.74 01/28/2020   BUN 13 01/28/2020   CO2 27 01/28/2020   TSH 1.83  10/10/2019   HGBA1C 6.3 01/28/2020    MR Brain W Wo Contrast  Result Date: 11/25/2019 CLINICAL DATA:  Acute headache. EXAM: MRI HEAD WITHOUT AND WITH CONTRAST TECHNIQUE: Multiplanar, multiecho pulse sequences of the  brain and surrounding structures were obtained without and with intravenous contrast. CONTRAST:  75mL GADAVIST GADOBUTROL 1 MMOL/ML IV SOLN COMPARISON:  None. FINDINGS: Brain: Generalized atrophy. Negative for acute infarct. Moderate chronic microvascular ischemic changes in the white matter and pons. Negative for hemorrhage or mass. Normal enhancement postcontrast infusion. Vascular: Normal arterial flow voids. Skull and upper cervical spine: Negative Sinuses/Orbits: Mild mucosal edema paranasal sinuses. Right mastoid effusion. Bilateral cataract extraction Other: None IMPRESSION: No acute abnormality Atrophy and moderate chronic microvascular ischemic change. Electronically Signed   By: Franchot Gallo M.D.   On: 11/25/2019 11:10       Assessment & Plan:   Problem List Items Addressed This Visit    Depression, major, single episode, complete remission ([place])    Continue lexapro.  Stable.        Essential (primary) hypertension    Blood pressure under reasonable control.  On amlodipine.  Follow pressures.  Follow metabolic panel.       Relevant Medications   amLODipine (NORVASC) 2.5 MG tablet   Headache    Seeing neurology.  Recommended magnesium and neck exercises.  Follow.  Overall appears to be better.        Relevant Medications   amLODipine (NORVASC) 2.5 MG tablet   Hyperlipidemia    On pravastatin.  Low cholesterol diet and exercise.  Follow lipid panel and liver function tests.        Relevant Medications   amLODipine (NORVASC) 2.5 MG tablet   Hypothyroidism    On thyroid replacement.  Follow tsh.        Leg weakness    Has been receiving physical therapy.  Follow.        Rash    Appears to be c/w yeast infection.  Treat with nystatin cream.  Follow.            Einar Pheasant, MD

## 2020-02-13 ENCOUNTER — Telehealth: Payer: Self-pay | Admitting: Internal Medicine

## 2020-02-13 NOTE — Telephone Encounter (Signed)
Pt had a couple questions about the amLODipine (NORVASC) 2.5 MG tablet

## 2020-02-14 ENCOUNTER — Other Ambulatory Visit: Payer: Self-pay

## 2020-02-14 ENCOUNTER — Other Ambulatory Visit
Admission: RE | Admit: 2020-02-14 | Discharge: 2020-02-14 | Disposition: A | Discharge status: Home / Self Care | Payer: Medicare HMO | Source: Ambulatory Visit | Attending: Pulmonary Disease | Admitting: Pulmonary Disease

## 2020-02-14 DIAGNOSIS — Z01812 Encounter for preprocedural laboratory examination: Secondary | ICD-10-CM | POA: Diagnosis not present

## 2020-02-14 DIAGNOSIS — Z20822 Contact with and (suspected) exposure to covid-19: Secondary | ICD-10-CM | POA: Diagnosis not present

## 2020-02-14 LAB — SARS CORONAVIRUS 2 (TAT 6-24 HRS): SARS Coronavirus 2: NEGATIVE

## 2020-02-14 NOTE — Telephone Encounter (Signed)
Pt called and need to ask a few questions and would like a call back today. She needs to ask before taking new prescription.

## 2020-02-14 NOTE — Telephone Encounter (Signed)
Pt calling to clarify if she is ok to take amlodipine with other meds. Advised needs to continue to take levothyroxine alone in the AM. Otherwise, ok. Patient also wanted to know the name of the fiber she was mixing in her drink. Per Dr Nicki Reaper, advised to use citrucel.

## 2020-02-17 DIAGNOSIS — R21 Rash and other nonspecific skin eruption: Secondary | ICD-10-CM | POA: Insufficient documentation

## 2020-02-17 NOTE — Assessment & Plan Note (Signed)
On thyroid replacement.  Follow tsh.  

## 2020-02-17 NOTE — Assessment & Plan Note (Signed)
On pravastatin.  Low cholesterol diet and exercise.  Follow lipid panel and liver function tests.   

## 2020-02-17 NOTE — Assessment & Plan Note (Signed)
Has been receiving physical therapy.  Follow.

## 2020-02-17 NOTE — Assessment & Plan Note (Signed)
Appears to be c/w yeast infection.  Treat with nystatin cream.  Follow.

## 2020-02-17 NOTE — Assessment & Plan Note (Signed)
Continue lexapro.  Stable.  

## 2020-02-17 NOTE — Assessment & Plan Note (Signed)
Seeing neurology.  Recommended magnesium and neck exercises.  Follow.  Overall appears to be better.

## 2020-02-17 NOTE — Assessment & Plan Note (Signed)
Blood pressure under reasonable control.  On amlodipine.  Follow pressures.  Follow metabolic panel.

## 2020-02-18 ENCOUNTER — Other Ambulatory Visit: Payer: Self-pay | Admitting: Respiratory Therapy

## 2020-02-18 ENCOUNTER — Other Ambulatory Visit: Payer: Self-pay

## 2020-02-18 ENCOUNTER — Ambulatory Visit: Payer: Medicare HMO | Attending: Pulmonary Disease

## 2020-02-18 DIAGNOSIS — R0602 Shortness of breath: Secondary | ICD-10-CM

## 2020-02-18 MED ORDER — ALBUTEROL SULFATE (2.5 MG/3ML) 0.083% IN NEBU
2.5000 mg | INHALATION_SOLUTION | Freq: Once | RESPIRATORY_TRACT | Status: AC
  Administered 2020-02-18: 2.5 mg via RESPIRATORY_TRACT
  Filled 2020-02-18: qty 3

## 2020-02-19 ENCOUNTER — Other Ambulatory Visit: Payer: Medicare HMO

## 2020-02-19 DIAGNOSIS — Z961 Presence of intraocular lens: Secondary | ICD-10-CM | POA: Diagnosis not present

## 2020-02-19 DIAGNOSIS — H5005 Alternating esotropia: Secondary | ICD-10-CM | POA: Diagnosis not present

## 2020-02-20 ENCOUNTER — Ambulatory Visit: Payer: Medicare HMO

## 2020-02-25 ENCOUNTER — Other Ambulatory Visit: Payer: Self-pay

## 2020-02-25 ENCOUNTER — Encounter: Payer: Self-pay | Admitting: Pulmonary Disease

## 2020-02-25 ENCOUNTER — Ambulatory Visit (INDEPENDENT_AMBULATORY_CARE_PROVIDER_SITE_OTHER): Payer: Medicare HMO | Admitting: Pulmonary Disease

## 2020-02-25 VITALS — BP 122/74 | HR 96 | Temp 97.9°F | Ht 63.0 in | Wt 144.6 lb

## 2020-02-25 DIAGNOSIS — L03011 Cellulitis of right finger: Secondary | ICD-10-CM | POA: Diagnosis not present

## 2020-02-25 DIAGNOSIS — R0602 Shortness of breath: Secondary | ICD-10-CM | POA: Diagnosis not present

## 2020-02-25 DIAGNOSIS — R5383 Other fatigue: Secondary | ICD-10-CM

## 2020-02-25 NOTE — Patient Instructions (Signed)
What we discussed today:   Your lung function is good  Your fatigue is not related to lung disease  Continue your exercise program  Consider increasing the protein in your diet  What you have in your right hand index finger is a paronychia you may have Dr. Nicki Reaper check that out for you  We will see you here as needed.  No follow-up appointment needed

## 2020-02-25 NOTE — Progress Notes (Signed)
Subjective:    Patient ID: Jennifer Bernard, female    DOB: 03-Aug-1927, 86 y.o.   MRN: [id number]  HPI Patient is a very pleasant 86 year old lifelong never smoker who presents for follow-up on the issue of fatigue.  Initially this was described as dyspnea however the patient does state that it is mostly fatigue.  Early on she had had issues with a cough productive of clear sputum however this was related to an upper respiratory type process which cleared on its own.  She has not had the symptoms at all for several months now.  She has not had any fevers, chills or sweats.  She continues to have low energy but no dyspnea per se is noted.  No chest pain.  She has had previous cardiac evaluation by Dr. Serafina Royals in November 2020 with no major significant abnormality for age.  Has not had any sleep disturbance no lower extremity edema no PND or orthopnea.  No calf tenderness.  Had PFTs 18 February 2020.  Some portions of the test are uninterpretable due to the patient's inability to perform the maneuvers instructed.  However despite some uninterpretable numbers her spirometry is normal.  Volumes are noninterpretable due to the patient's inability to perform maneuvers however note is made that slow vital capacity is normal.  Her flow volume loop shows her difficulty in performing the maneuvers.  Diffusion capacity numbers are noninterpretable due to difficulty of the patient performing the maneuver.  Review of Systems A 10 point review of systems was performed and it is as noted above otherwise negative.  Allergies  Allergen Reactions  . Penicillins Hives    Other reaction(s): Unknown  . Ceftin [Cefuroxime Axetil] Diarrhea    GI distress  . Ramipril     Other reaction(s): Cough Other reaction(s): Cough    Current Meds  Medication Sig  . amLODipine (NORVASC) 2.5 MG tablet Take 1 tablet (2.5 mg total) by mouth daily.  Marland Kitchen escitalopram (LEXAPRO) 10 MG tablet Take 1 tablet (10 mg total) by mouth  daily.  Marland Kitchen levothyroxine (SYNTHROID, LEVOTHROID) 75 MCG tablet   . mometasone (ELOCON) 0.1 % cream   . Multiple Vitamin (MULTIVITAMIN) capsule Take by mouth.  . nystatin cream (MYCOSTATIN) Apply 1 application topically 2 (two) times daily.  . pravastatin (PRAVACHOL) 40 MG tablet Take 1 tablet (40 mg total) by mouth every evening.  . [DISCONTINUED] trospium (SANCTURA) 20 MG tablet Take 1 tablet (20 mg total) by mouth at bedtime.   Immunization History  Administered Date(s) Administered  . Fluad Quad(high Dose 65+) 05/20/2019  . Influenza-Unspecified 05/28/2015, 05/15/2016, 05/09/2017  . PFIZER SARS-COV-2 Vaccination 08/21/2019, 09/11/2019  . Pneumococcal Conjugate-13 04/16/2014  . Pneumococcal Polysaccharide-23 08/15/2009  . Tdap 08/15/2010  . Zoster 02/27/2010  . Zoster Recombinat (Shingrix) 02/28/2019       Objective:   Physical Exam BP 122/74 (BP Location: Left Arm, Cuff Size: Normal)   Pulse 96   Temp 97.9 F (36.6 C) (Temporal)   Ht 5\' 3"  (1.6 m)   Wt 144 lb 9.6 oz (65.6 kg)   SpO2 98%   BMI 25.61 kg/m   GENERAL: Well-developed well-nourished woman who looks younger than her stated 86 years of age. She is fully ambulatory. HEAD: Normocephalic, atraumatic.  EYES: Pupils equal, round, reactive to light.  No scleral icterus.  MOUTH: Nose/mouth/throat not examined due to masking requirements for COVID 19. NECK: Supple. No thyromegaly. No nodules. No JVD.  No tracheal deviation. PULMONARY: Lungs clear to auscultation bilaterally.  No adventitious sounds. CARDIOVASCULAR: S1 and S2.  Tachycardic rate and regular rhythm.  Soft grade 1/6 systolic ejection murmur at the left sternal border.  No gallops or rubs noted. GASTROINTESTINAL: Nondistended. MUSCULOSKELETAL: No joint deformity, no clubbing, no edema.  NEUROLOGIC: Awake alert, fluent speech, fully ambulatory, no gait disturbance. SKIN: Intact,warm,dry.  Limited exam shows no rashes.  She does have an area of developing  paronychia on the index finger of her right hand. PSYCH: Mood and behavior normal     Assessment & Plan:     ICD-10-CM   1. SOB (shortness of breath)  R06.02    Per patient this is actually mostly fatigue Spirometry is normal   2. Other fatigue  R53.83    No evidence of pulmonary disease Letter metabolic/cardiac causes  3. Paronychia of finger of right hand  L03.011    Patient to have this evaluated by primary physician   Discussion:  Patient has no evidence of pulmonary disease causing her symptoms of fatigue.  Recommend evaluation of potential metabolic/cardiac etiologies.  Alternatively this could be related to age-related sarcopenia and deconditioning.  Recommend increase protein intake and conditioning program suited to her age.  Discussed with the patient.  We will see her in follow-up on an as-needed basis.  Renold Don, MD [facility]   *This note was dictated using voice recognition software/Dragon.  Despite best efforts to proofread, errors can occur which can change the meaning.  Any change was purely unintentional.

## 2020-02-26 ENCOUNTER — Ambulatory Visit: Payer: Medicare HMO | Attending: Internal Medicine

## 2020-02-26 ENCOUNTER — Other Ambulatory Visit: Payer: Self-pay

## 2020-02-26 DIAGNOSIS — R2681 Unsteadiness on feet: Secondary | ICD-10-CM | POA: Diagnosis not present

## 2020-02-26 DIAGNOSIS — M6281 Muscle weakness (generalized): Secondary | ICD-10-CM | POA: Diagnosis not present

## 2020-02-26 NOTE — Therapy (Signed)
[facility] [address] [place], [address] Phone: [phone]   Fax:  [phone]  Physical Therapy Treatment  Patient Details  Name: Jennifer Bernard MRN: [id number] Date of Birth: 29-Mar-1927 Referring Provider (PT): Einar Pheasant, MD   Encounter Date: 02/26/2020   PT End of Session - 02/26/20 1036    Visit Number 25    Number of Visits 35    Date for PT Re-Evaluation 04/09/20    Authorization Type 5    Authorization Time Period of 10 progress report    PT Start Time 1036    PT Stop Time 1116    PT Time Calculation (min) 40 min    Activity Tolerance Patient tolerated treatment well    Behavior During Therapy [facility] for tasks assessed/performed           Past Medical History:  Diagnosis Date  . Asymptomatic menopausal state 02/20/1984   Overview:  Naturally occuring and asymptomatic Vaginal atrophy present on topical estrogen Overview:  Overview:  Naturally occuring and asymptomatic Vaginal atrophy present on topical estrogen  . Basal cell carcinoma of skin 02/19/2013   Overview:  Basal cell on her nasolabial fold 2014 S/p Mohs by Dr. Lacinda Axon Followed by local dermatologist regularly Overview:  Overview:  Followed by Dr. Lacinda Axon  . Cyst of ovary 02/19/2009   Overview:  Ovarian cyst on right stable 2004-2010 Cyst ? larger on CT 2012 Stable on follow up US Followed by Dr. Edwinna Areola Overview:  Overview:  Ovarian cyst on right stable 2004-2010 Cyst ? larger on CT 2012 Stable on follow up US Followed by Dr. Edwinna Areola  . Depression, major, single episode, complete remission ([place]) 07/18/2017  . Diffuse cystic mastopathy of breast 02/19/2013   Overview:  Mother with breast cancer later in life Two breast biopsies both with normal pathology Annual mammograms recommended, last 2016 Followed at the [facility]:  Relevant Hx: Course: Daily Update: Today's Plan: Overview:  Overview:   Mother with breast cancer later in life Two breast biopsies both with normal pathology Annual mammograms recommended, last 5  . Essential (primary) hypertension 02/19/2013   Overview:  Goal blood pressure < 135/85 On atenolol Overview:  Overview:  Goal blood pressure < 135/85 On atenolol  . Hyperlipidemia 02/19/2013   Overview:  Goal LDL < 130, last value 92 in 2017 On Pravachol Overview:  Overview:  Goal LDL < 130, last value 82 in 2012 On Pravachol  . Hypothyroidism 02/28/2012   Overview:  started on T4  TFT normal 2017 Followed by Dr. Almon Hercules Overview:  Overview:  started on T4  TFT normal 2014 Followed by Dr. Almon Hercules  . Impaired glucose tolerance 02/20/2003   Overview:  Overview:  Glucose goal < 100, last value 94 in 2014 HgbA1c goal < 6.0, last value 5.3 in 2014 Urine microalbumin negative in 2014 On diet and exercise  . Nontoxic multinodular goiter 03/06/2003   Overview:  Asymptomatic nodular thyroid Stable [place] 2004-2014 Followed by Dr. Almon Hercules Overview:  Overview:  Asymptomatic nodular thyroid Stable [place] 2004-2014 Followed by Dr. Almon Hercules  . OSA on CPAP 01/13/2015   Overview:  Sleep study sent to medical records:   Sleep efficiency 22%, latency 24 min, # awakenings 11, res desat N=10, index 17.8  Apnea 1 for 16 sec, hypopnea 16 for 18 sec, total sleep time only 90 min so index = 11 She did not sleep much and the index  puts her in mild category Repeat study a few days later when she slept 187 min her AHI = 4.6 which is normal, average O2 sat 94% I don't think   . Osteoarthritis 02/20/2003   Overview:  Mild bilateral hands Right CMC Right hip Left knee s/p meniscus tear All symptomatically stable Overview:  Overview:  Mild bilateral hands Right CMC Right hip Left knee s/p meniscus tear All symptomatically stable  . Osteoporosis 02/20/2003   Overview:  Bone density 2004 LS -3.32 and RH-1.62 Bone density 2008 LS -3.14 and RH -1.22 Bone density 2009 LS -3.17 and RH -1.21 Bone density 2010 LS -3.14 and RH  -1.42  Bone density 2014 LS -2.7 (L3 -3.1) and RH -1.5 Bone density 2017 LS -2.3 and RH -1.4 (neck -1.7) Vitamin D level 38 in 2012 On Boniva 2004 to 2010, restarted 2015 Repeat bone density in 2020 and stop Boniva  Last Assessment & Pl  . Prediabetes 02/20/2003   Overview:  Glucose goal < 100, last value 91 in 2017 HgbA1c goal < 6.0, last value 5.6 in 2017 Urine microalbumin negative in 2017 On diet and exercise  . Recurrent UTI 10/19/2014    Past Surgical History:  Procedure Laterality Date  . ABDOMINAL HYSTERECTOMY  1975  . URETEROSCOPY    . URETEROSCOPY WITH HOLMIUM LASER LITHOTRIPSY      There were no vitals filed for this visit.   Subjective Assessment - 02/26/20 1037    Subjective Doing pretty good. Just a little sore R lateral hip. 1-2/10 R lateral hip soreness.  Every now and then her legs are a little shaky. Shakiness is not much, about the same. Legs feel wobbly sometimes, does not notice it at any particular time.    Pertinent History LE weakness, unsteadiness. Symptoms began in the last few months. Gradual onset. Legs feeling shaky and wobbly, especially in the thighs. UE do not feel wobbly. Has not had the problem before. No pain or discomfort. No back pain. No falls in the last 6 months (has fear of falling). Pt currently lives in the [place] [facility] [place]. Does not use an AD. Has not yet had PT before. Exercises about 35 min a day (stationary bike, elliptical, and the Nustep)    Patient Stated Goals Improve strength and balance.    Currently in Pain? Yes    Pain Score 2     Pain Location Hip    Pain Orientation Right;Lateral    Pain Descriptors / Indicators Sore              [facility] PT Assessment - 02/26/20 1041      Strength   Right Hip Extension 5/5   seated manually resisted   Right Hip ABduction 4+/5    Left Hip Extension 5/5   seated manually resisted   Left Hip ABduction 4-/5      Dynamic Gait Index   Level Surface Mild Impairment    Change in Gait Speed Normal     Gait with Horizontal Head Turns Normal    Gait with Vertical Head Turns Mild Impairment    Gait and Pivot Turn Normal    Step Over Obstacle Mild Impairment    Step Around Obstacles Normal    Steps Mild Impairment    Total Score 20    DGI comment: < 19 suggests increased fall risk  PT Education - 02/26/20 1102    Education Details ther-ex, progress/current status towards goals    Person(s) Educated Patient    Methods Explanation;Demonstration;Tactile cues;Verbal cues    Comprehension Returned demonstration;Verbalized understanding          Objective  No latex band allergies  Blood pressure is controlledper pt  L Pelvic drop during R LE stance phase with unsteadiness dizziness when turning to the R in supine   Usually feels more off balance early in the morning when getting out of bed. Feels a little"off kilter". Gets out at the R side of the bed.  (dizziness with supine R rotation.) Dix Hallpike (-) bilaterally  MedbridgeAccess Code: [email or web address]     Therapeutic exercise   Directed patient with gait with normal gait speed, with changes in speed, 180 degree pivot turn, with R and L cervical rotation position, with cervical flexion and extension position, stepping around obstacles, stepping over an obstacle, ascending and descending 4 regular steps with UE assist   Seated manually resisted hip extension, S/L hip abduction 1-2x each way for each LE    Reviewed progress with strength and balance with pt   Reviewed plan of care: Do HEP for the next 5-6 weeks and return for a follow up visit to assess ability to maintain progress (week of 04/02/2020)     Improved exercise technique, movement at target joints, use of target muscles after mod verbal, visual, tactile cues.       Pt response/clinical impression: Pt demonstrates improved bilateral hip extension and abduction  strength as well as improved Berg and DGI scores suggesting improved balance since initial evaluation. Pt has made very good progress with PT towards goals. Pt to continue with her HEP for the next 5-6 weeks and return for a follow up appointment to assess ability to maintain progress. Pt will benefit from continued skilled physical therapy services to continued to improve strength, balance and function.      PT Short Term Goals - 11/28/19 1540      PT SHORT TERM GOAL #1   Title Pt will be independent wiht her HEP to improve strength, balance, and function.    Time 3    Period Weeks    Status On-going    Target Date 11/14/19             PT Long Term Goals - 02/26/20 1054      PT LONG TERM GOAL #1   Title Patient will improve bilateral hip abduction and extension strength to improve steadiness during gait and decrease fall risk.    Time 6    Period Weeks    Status Achieved    Target Date 02/13/20      PT LONG TERM GOAL #2   Title Patient will improve her Berg Balance score to 48/56 as a demonstration of improved balance.    Baseline 42/56 (10/22/2019); 48/56 (11/28/2019)    Time 8    Period Weeks    Status Achieved      PT LONG TERM GOAL #3   Title Pt will improve DGI score to at least 19 points as a demonstration of improved balance with gait.    Baseline DGI 11 (12/18/2019); 15 (01/20/2020); 20 (02/26/2020)    Time 3    Period Weeks    Status Achieved    Target Date 02/13/20      PT LONG TERM GOAL #4   Title Patient will be able to maintain her progress with PT towards  goals with her HEP in a month.    Time 5    Period Weeks    Status New    Target Date 04/09/20                 Plan - 02/26/20 1234    Clinical Impression Statement Pt demonstrates improved bilateral hip extension and abduction strength as well as improved Berg and DGI scores suggesting improved balance since initial evaluation. Pt has made very good progress with PT towards goals. Pt to continue  with her HEP for the next 5-6 weeks and return for a follow up appointment to assess ability to maintain progress. Pt will benefit from continued skilled physical therapy services to continued to improve strength, balance and function.    Personal Factors and Comorbidities Age;Comorbidity 3+;Past/Current Experience;Time since onset of injury/illness/exacerbation;Fitness    Comorbidities HTN, OA, osteoporosis    Examination-Activity Limitations Carry;Locomotion Level    Stability/Clinical Decision Making Stable/Uncomplicated    Clinical Decision Making Low    Rehab Potential Fair    PT Frequency 1x / week    PT Duration 6 weeks    PT Treatment/Interventions Neuromuscular re-education;Therapeutic exercise;Therapeutic activities;Functional mobility training;Balance training;Patient/family education;Manual techniques;Dry needling;Vestibular;Aquatic Therapy;Canalith Repostioning;Electrical Stimulation;Iontophoresis 4mg /ml Dexamethasone;Gait training;Stair training    PT Next Visit Plan glute med, max, strunk strengthening, lumbopelvic control. Manual techniques, modalities PRN    Consulted and Agree with Plan of Care Patient           Patient will benefit from skilled therapeutic intervention in order to improve the following deficits and impairments:  Postural dysfunction, Improper body mechanics, Decreased strength, Decreased balance  Visit Diagnosis: Unsteadiness on feet - Plan: PT plan of care cert/re-cert  Muscle weakness (generalized) - Plan: PT plan of care cert/re-cert     Problem List Patient Active Problem List   Diagnosis Date Noted  . Rash 02/17/2020  . Leg weakness 10/13/2019  . Headache 10/10/2019  . Breast tenderness 10/10/2019  . Inversion of right nipple 10/10/2019  . Acid reflux 09/08/2019  . Breast cancer screening 06/08/2019  . Cracked lip 05/25/2019  . SOBOE (shortness of breath on exertion) 05/20/2019  . Nocturia 03/21/2019  . Personal history of kidney stones  03/21/2019  . Depression, major, single episode, complete remission ([place]) 07/18/2017  . Insomnia secondary to depression with anxiety 02/25/2017  . Adjustment reaction with anxiety and depression 02/26/2016  . Encounter for counseling 12/01/2015  . Microhematuria 03/10/2015  . OSA (obstructive sleep apnea) 01/13/2015  . Nephrolithiasis 12/30/2014  . Recurrent UTI 10/19/2014  . Hyperlipidemia 02/19/2013  . Essential (primary) hypertension 02/19/2013  . Diffuse cystic mastopathy of breast 02/19/2013  . Basal cell carcinoma of skin 02/19/2013  . Hypothyroidism 02/28/2012  . Cyst of ovary 02/19/2009  . Nontoxic multinodular goiter 03/06/2003  . Prediabetes 02/20/2003  . Osteoporosis 02/20/2003  . Osteoarthritis 02/20/2003  . Impaired glucose tolerance 02/20/2003  . Asymptomatic menopausal state 02/20/1984   Joneen Boers PT, DPT   02/26/2020, 12:44 PM  [facility] [address], [place], [address] Phone: [phone]   Fax:  [phone]  Name: KATRINIA STRAKER MRN: [id number] Date of Birth: 1927/06/22

## 2020-02-27 ENCOUNTER — Telehealth: Payer: Self-pay | Admitting: Internal Medicine

## 2020-02-27 NOTE — Telephone Encounter (Signed)
Pt called in wanted to know if it was ok to drink ensure, issues she need to discuss with Dr.Scott are : breathing, sore on finger Dr.Scott prescribed her a cream want to know if she needs to continue using it. And last bladder she went to doctor for her bladder and he gave her medication and she stopped taking now having same problem before taking it wanted to know if she should start back taking it.

## 2020-03-05 DIAGNOSIS — E042 Nontoxic multinodular goiter: Secondary | ICD-10-CM | POA: Diagnosis not present

## 2020-03-05 DIAGNOSIS — E039 Hypothyroidism, unspecified: Secondary | ICD-10-CM | POA: Diagnosis not present

## 2020-03-06 NOTE — Telephone Encounter (Signed)
Called pt and let her know that it is ok for her to drink ensure if she would like. Asked patient if she was having any other issues. Patient stated she was doing good. She has a follow up with Dr Nicki Reaper on 8/18. Advised patient to call back if she needs anything before then.

## 2020-03-11 ENCOUNTER — Telehealth: Payer: Self-pay | Admitting: Internal Medicine

## 2020-03-11 DIAGNOSIS — N83209 Unspecified ovarian cyst, unspecified side: Secondary | ICD-10-CM | POA: Diagnosis not present

## 2020-03-11 DIAGNOSIS — Z780 Asymptomatic menopausal state: Secondary | ICD-10-CM | POA: Diagnosis not present

## 2020-03-11 DIAGNOSIS — M810 Age-related osteoporosis without current pathological fracture: Secondary | ICD-10-CM | POA: Diagnosis not present

## 2020-03-11 DIAGNOSIS — N6019 Diffuse cystic mastopathy of unspecified breast: Secondary | ICD-10-CM | POA: Diagnosis not present

## 2020-03-11 DIAGNOSIS — Z0000 Encounter for general adult medical examination without abnormal findings: Secondary | ICD-10-CM | POA: Diagnosis not present

## 2020-03-11 DIAGNOSIS — E042 Nontoxic multinodular goiter: Secondary | ICD-10-CM | POA: Diagnosis not present

## 2020-03-11 DIAGNOSIS — I10 Essential (primary) hypertension: Secondary | ICD-10-CM | POA: Diagnosis not present

## 2020-03-11 DIAGNOSIS — E039 Hypothyroidism, unspecified: Secondary | ICD-10-CM | POA: Diagnosis not present

## 2020-03-11 DIAGNOSIS — R7303 Prediabetes: Secondary | ICD-10-CM | POA: Diagnosis not present

## 2020-03-11 DIAGNOSIS — E7800 Pure hypercholesterolemia, unspecified: Secondary | ICD-10-CM | POA: Diagnosis not present

## 2020-03-11 MED ORDER — AMLODIPINE BESYLATE 2.5 MG PO TABS: 2.5000 mg | ORAL_TABLET | Freq: Every day | ORAL | 1 refills | Status: DC

## 2020-03-11 NOTE — Telephone Encounter (Signed)
Pt needs a refill sent to [facility]

## 2020-03-11 NOTE — Telephone Encounter (Signed)
Pt needs a Refill of amLODipine (NORVASC) 2.5 MG tablet sent to [facility]

## 2020-03-12 ENCOUNTER — Ambulatory Visit (INDEPENDENT_AMBULATORY_CARE_PROVIDER_SITE_OTHER): Payer: Medicare HMO | Admitting: Dermatology

## 2020-03-12 ENCOUNTER — Other Ambulatory Visit: Payer: Self-pay

## 2020-03-12 DIAGNOSIS — L988 Other specified disorders of the skin and subcutaneous tissue: Secondary | ICD-10-CM

## 2020-03-12 DIAGNOSIS — L03012 Cellulitis of left finger: Secondary | ICD-10-CM

## 2020-03-12 DIAGNOSIS — L570 Actinic keratosis: Secondary | ICD-10-CM

## 2020-03-12 DIAGNOSIS — L820 Inflamed seborrheic keratosis: Secondary | ICD-10-CM

## 2020-03-12 NOTE — Patient Instructions (Signed)

## 2020-03-12 NOTE — Progress Notes (Signed)
Follow-Up Visit   Subjective  Jennifer Bernard is a 86 y.o. female who presents for the following: Other (Spots on face that seem scaly) and Other (Paronychia of left index finger - was given an antibiotic and she just tool one pill and it kept her up all night). She also would like consider Botox treatment today.  The following portions of the chart were reviewed this encounter and updated as appropriate:  Tobacco  Allergies  Meds  Problems  Med Hx  Surg Hx  Fam Hx     Review of Systems:  No other skin or systemic complaints except as noted in HPI or Assessment and Plan.  Objective  Well appearing patient in no apparent distress; mood and affect are within normal limits.  A focused examination was performed including face. Relevant physical exam findings are noted in the Assessment and Plan.  Objective  Left Eyebrow: Erythematous thin papules/macules with gritty scale.   Objective  Left Forehead: Erythematous keratotic or waxy stuck-on papule or plaque.   Objective  Left Distal 2nd Finger: Pinkness and edema  Objective  Frown complex, forehead, brow lift: Rhytides and volume loss.   Images           Assessment & Plan  AK (actinic keratosis) Left Eyebrow  Destruction of lesion - Left Eyebrow Complexity: simple   Destruction method: cryotherapy   Informed consent: discussed and consent obtained   Timeout:  patient name, date of birth, surgical site, and procedure verified Lesion destroyed using liquid nitrogen: Yes   Region frozen until ice ball extended beyond lesion: Yes   Outcome: patient tolerated procedure well with no complications   Post-procedure details: wound care instructions given    Inflamed seborrheic keratosis Left Forehead  Destruction of lesion - Left Forehead Complexity: simple   Destruction method: cryotherapy   Informed consent: discussed and consent obtained   Timeout:  patient name, date of birth, surgical site, and  procedure verified Lesion destroyed using liquid nitrogen: Yes   Region frozen until ice ball extended beyond lesion: Yes   Outcome: patient tolerated procedure well with no complications   Post-procedure details: wound care instructions given    Paronychia of finger of left hand Left Distal 2nd Finger Zilxi foam bid - sample given  Elastosis of skin Frown complex, forehead, brow lift  Frown Complex - 25 units Brow lift - 2.5 units each side Forehead - 7.5 units total  Botox Injection - Frown complex, forehead, brow lift Location: See attached image  Informed consent: Discussed risks (infection, pain, bleeding, bruising, swelling, allergic reaction, paralysis of nearby muscles, eyelid droop, double vision, neck weakness, difficulty breathing, headache, undesirable cosmetic result, and need for additional treatment) and benefits of the procedure, as well as the alternatives.  Informed consent was obtained.  Preparation: The area was cleansed with alcohol.  Procedure Details:  Botox was injected into the dermis with a 30-gauge needle. Pressure applied to any bleeding. Ice packs offered for swelling.  Lot Number:  [id number] Expiration:  06/2022  Total Units Injected:  37.5  Plan: Patient was instructed to remain upright for 4 hours. Patient was instructed to avoid massaging the face and avoid vigorous exercise for the rest of the day. Tylenol may be used for headache.  Allow 2 weeks before returning to clinic for additional dosing as needed. Patient will call for any problems.   Return in about 2 months (around 05/13/2020) for follow up and 3-4 months for Botox.  I, Ashok Cordia,  CMA, am acting as scribe for Sarina Ser, MD .  Documentation: I have reviewed the above documentation for accuracy and completeness, and I agree with the above.  Sarina Ser, MD

## 2020-03-15 ENCOUNTER — Encounter: Payer: Self-pay | Admitting: Dermatology

## 2020-03-25 ENCOUNTER — Ambulatory Visit (INDEPENDENT_AMBULATORY_CARE_PROVIDER_SITE_OTHER): Payer: Medicare HMO

## 2020-03-25 VITALS — Ht 63.0 in | Wt 144.0 lb

## 2020-03-25 DIAGNOSIS — Z0000 Encounter for general adult medical examination without abnormal findings: Secondary | ICD-10-CM

## 2020-03-25 NOTE — Progress Notes (Addendum)
Subjective:   Jennifer Bernard is a 86 y.o. female who presents for an Initial Medicare Annual Wellness Visit.  Review of Systems    No ROS.  Medicare Wellness Virtual Visit.   Cardiac Risk Factors include: advanced age (>2men, >49 women)     Objective:    Today's Vitals   03/25/20 1406  Weight: 144 lb (65.3 kg)  Height: 5\' 3"  (1.6 m)   Body mass index is 25.51 kg/m.  Advanced Directives 03/25/2020 10/22/2019  Does Patient Have a Medical Advance Directive? Yes Yes  Type of Paramedic of [place];Living will Living will;Healthcare Power of [place];Out of facility DNR (pink MOST or yellow form)  Does patient want to make changes to medical advance directive? No - Patient declined No - Patient declined  Copy of [place] in Chart? No - copy requested -    Current Medications (verified) Outpatient Encounter Medications as of 03/25/2020  Medication Sig  . amLODipine (NORVASC) 2.5 MG tablet Take 1 tablet (2.5 mg total) by mouth daily.  Marland Kitchen escitalopram (LEXAPRO) 10 MG tablet Take 1 tablet (10 mg total) by mouth daily.  Marland Kitchen levothyroxine (SYNTHROID, LEVOTHROID) 75 MCG tablet   . mometasone (ELOCON) 0.1 % cream   . Multiple Vitamin (MULTIVITAMIN) capsule Take by mouth.  . nystatin cream (MYCOSTATIN) Apply 1 application topically 2 (two) times daily.  . pravastatin (PRAVACHOL) 40 MG tablet Take 1 tablet (40 mg total) by mouth every evening.   No facility-administered encounter medications on file as of 03/25/2020.    Allergies (verified) Penicillins, Ceftin [cefuroxime axetil], and Ramipril   History: Past Medical History:  Diagnosis Date  . Asymptomatic menopausal state 02/20/1984   Overview:  Naturally occuring and asymptomatic Vaginal atrophy present on topical estrogen Overview:  Overview:  Naturally occuring and asymptomatic Vaginal atrophy present on topical estrogen  . Basal cell carcinoma of skin 02/19/2013   Overview:  Basal cell  on her nasolabial fold 2014 S/p Mohs by Dr. Lacinda Axon Followed by local dermatologist regularly Overview:  Overview:  Followed by Dr. Lacinda Axon  . Cyst of ovary 02/19/2009   Overview:  Ovarian cyst on right stable 2004-2010 Cyst ? larger on CT 2012 Stable on follow up US Followed by Dr. Edwinna Areola Overview:  Overview:  Ovarian cyst on right stable 2004-2010 Cyst ? larger on CT 2012 Stable on follow up US Followed by Dr. Edwinna Areola  . Depression, major, single episode, complete remission ([place]) 07/18/2017  . Diffuse cystic mastopathy of breast 02/19/2013   Overview:  Mother with breast cancer later in life Two breast biopsies both with normal pathology Annual mammograms recommended, last 2016 Followed at the [facility]:  Relevant Hx: Course: Daily Update: Today's Plan: Overview:  Overview:  Mother with breast cancer later in life Two breast biopsies both with normal pathology Annual mammograms recommended, last 54  . Essential (primary) hypertension 02/19/2013   Overview:  Goal blood pressure < 135/85 On atenolol Overview:  Overview:  Goal blood pressure < 135/85 On atenolol  . Hyperlipidemia 02/19/2013   Overview:  Goal LDL < 130, last value 92 in 2017 On Pravachol Overview:  Overview:  Goal LDL < 130, last value 82 in 2012 On Pravachol  . Hypothyroidism 02/28/2012   Overview:  started on T4  TFT normal 2017 Followed by Dr. Almon Hercules Overview:  Overview:  started on T4  TFT normal 2014 Followed by Dr. Almon Hercules  . Impaired glucose tolerance 02/20/2003  Overview:  Overview:  Glucose goal < 100, last value 94 in 2014 HgbA1c goal < 6.0, last value 5.3 in 2014 Urine microalbumin negative in 2014 On diet and exercise  . Nontoxic multinodular goiter 03/06/2003   Overview:  Asymptomatic nodular thyroid Stable [place] 2004-2014 Followed by Dr. Almon Hercules Overview:  Overview:  Asymptomatic nodular thyroid Stable [place] 2004-2014 Followed by Dr. Almon Hercules  . OSA on CPAP 01/13/2015   Overview:  Sleep  study sent to medical records:   Sleep efficiency 22%, latency 24 min, # awakenings 11, res desat N=10, index 17.8  Apnea 1 for 16 sec, hypopnea 16 for 18 sec, total sleep time only 90 min so index = 11 She did not sleep much and the index puts her in mild category Repeat study a few days later when she slept 187 min her AHI = 4.6 which is normal, average O2 sat 94% I don't think   . Osteoarthritis 02/20/2003   Overview:  Mild bilateral hands Right CMC Right hip Left knee s/p meniscus tear All symptomatically stable Overview:  Overview:  Mild bilateral hands Right CMC Right hip Left knee s/p meniscus tear All symptomatically stable  . Osteoporosis 02/20/2003   Overview:  Bone density 2004 LS -3.32 and RH-1.62 Bone density 2008 LS -3.14 and RH -1.22 Bone density 2009 LS -3.17 and RH -1.21 Bone density 2010 LS -3.14 and RH -1.42  Bone density 2014 LS -2.7 (L3 -3.1) and RH -1.5 Bone density 2017 LS -2.3 and RH -1.4 (neck -1.7) Vitamin D level 38 in 2012 On Boniva 2004 to 2010, restarted 2015 Repeat bone density in 2020 and stop Boniva  Last Assessment & Pl  . Prediabetes 02/20/2003   Overview:  Glucose goal < 100, last value 91 in 2017 HgbA1c goal < 6.0, last value 5.6 in 2017 Urine microalbumin negative in 2017 On diet and exercise  . Recurrent UTI 10/19/2014  . Squamous cell carcinoma of skin 03/14/2019   Left mid brow. SCCis, hypertrophic. EDC   Past Surgical History:  Procedure Laterality Date  . ABDOMINAL HYSTERECTOMY  1975  . URETEROSCOPY    . URETEROSCOPY WITH HOLMIUM LASER LITHOTRIPSY     Family History  Problem Relation Age of Onset  . Breast cancer Mother   . Skin cancer Father    Social History   Socioeconomic History  . Marital status: Widowed    Spouse name: Not on file  . Number of children: Not on file  . Years of education: Not on file  . Highest education level: Not on file  Occupational History  . Not on file  Tobacco Use  . Smoking status: Never Smoker  . Smokeless tobacco:  Never Used  Vaping Use  . Vaping Use: Never used  Substance and Sexual Activity  . Alcohol use: No  . Drug use: No  . Sexual activity: Not Currently    Birth control/protection: Post-menopausal  Other Topics Concern  . Not on file  Social History Narrative  . Not on file   Social Determinants of Health   Financial Resource Strain: Low Risk   . Difficulty of Paying Living Expenses: Not hard at all  Food Insecurity: No Food Insecurity  . Worried About Charity fundraiser in the Last Year: Never true  . Ran Out of Food in the Last Year: Never true  Transportation Needs: No Transportation Needs  . Lack of Transportation (Medical): No  . Lack of Transportation (Non-Medical): No  Physical Activity: Sufficiently Active  .  Days of Exercise per Week: 5 days  . Minutes of Exercise per Session: 30 min  Stress: No Stress Concern Present  . Feeling of Stress : Not at all  Social Connections: Unknown  . Frequency of Communication with Friends and Family: More than three times a week  . Frequency of Social Gatherings with Friends and Family: More than three times a week  . Attends Religious Services: Not on file  . Active Member of Clubs or Organizations: Not on file  . Attends Archivist Meetings: Not on file  . Marital Status: Widowed    Tobacco Counseling Counseling given: Not Answered   Clinical Intake:  Pre-visit preparation completed: Yes        Diabetes: No  How often do you need to have someone help you when you read instructions, pamphlets, or other written materials from your doctor or pharmacy?: 1 - Never  Interpreter Needed?: No      Activities of Daily Living In your present state of health, do you have any difficulty performing the following activities: 03/25/2020  Hearing? Y  Comment Hearing aids  Vision? N  Difficulty concentrating or making decisions? N  Walking or climbing stairs? N  Dressing or bathing? N  Doing errands, shopping? Y    Comment She does not Physiological scientist and eating ? Y  Comment Staff assists with meal prep. Self feeds.  Using the Toilet? N  In the past six months, have you accidently leaked urine? N  Do you have problems with loss of bowel control? N  Managing your Medications? N  Managing your Finances? N  Housekeeping or managing your Housekeeping? Y  Comment Staff assist  Some recent data might be hidden    Patient Care Team: Einar Pheasant, MD as PCP - General (Internal Medicine)  Indicate any recent Medical Services you may have received from other than [facility] providers in the past year (date may be approximate).     Assessment:   This is a routine wellness examination for Jennifer Bernard.  I connected with Jennifer Bernard today by telephone and verified that I am speaking with the correct person using two identifiers. Location patient: home Location provider: work Persons participating in the virtual visit: patient, Marine scientist.    I discussed the limitations, risks, security and privacy concerns of performing an evaluation and management service by telephone and the availability of in person appointments. The patient expressed understanding and verbally consented to this telephonic visit.    Interactive audio and video telecommunications were attempted between this provider and patient, however failed, due to patient having technical difficulties OR patient did not have access to video capability.  We continued and completed visit with audio only.  Some vital signs may be absent or patient reported.   Hearing/Vision screen  Hearing Screening   125Hz  250Hz  500Hz  1000Hz  2000Hz  3000Hz  4000Hz  6000Hz  8000Hz   Right ear:           Left ear:           Comments: Hearing aids  Vision Screening Comments: Followed by [facility] Wears corrective lenses Cataract extraction, bilateral Visual acuity not assessed, virtual visit.  They have seen their ophthalmologist in the last 12 months.    Dietary  issues and exercise activities discussed: Current Exercise Habits: Home exercise routine, Type of exercise: strength training/weights;stretching;calisthenics, Time (Minutes): 30, Frequency (Times/Week): 5, Weekly Exercise (Minutes/Week): 150, Intensity: Mild  Healthy diet Good water intake  Goals    . Maintain Healthy  Lifestyle     Stay active  Stay hydrated Healthy diet      Depression Screen PHQ 2/9 Scores 03/25/2020 02/10/2020 10/10/2019  PHQ - 2 Score 0 0 0  PHQ- 9 Score 0 0 0    Fall Risk Fall Risk  03/25/2020 09/04/2019  Falls in the past year? 0 1  Number falls in past yr: 0 0  Injury with Fall? - 0  Follow up Falls evaluation completed Falls evaluation completed   Handrails in use when using stairs? Yes  Home free of loose throw rugs in walkways, pet beds, electrical cords, etc? Yes  Adequate lighting in your home to reduce risk of falls? Yes   ASSISTIVE DEVICES UTILIZED TO PREVENT FALLS:  Life alert? Yes  Use of a cane, walker or w/c? No  Grab bars in the bathroom? Yes  Shower chair or bench in shower? No  Elevated toilet seat or a handicapped toilet? No    TIMED UP AND GO:  Was the test performed? No . Virtual visit.    Cognitive Function:  Patient is alert and oriented x3.   She enjoys reading and completing crossword puzzles. 6CIT Screen 03/25/2020  What Year? 0 points  What month? 0 points  What time? 0 points  Months in reverse 0 points  Repeat phrase 0 points    Immunizations Immunization History  Administered Date(s) Administered  . Fluad Quad(high Dose 65+) 05/20/2019  . Influenza-Unspecified 05/28/2015, 05/15/2016, 05/09/2017  . PFIZER SARS-COV-2 Vaccination 08/21/2019, 09/11/2019  . Pneumococcal Conjugate-13 04/16/2014  . Pneumococcal Polysaccharide-23 08/15/2009  . Tdap 08/15/2010  . Zoster 02/27/2010  . Zoster Recombinat (Shingrix) 02/28/2019    Health Maintenance Health Maintenance  Topic Date Due  . INFLUENZA VACCINE  06/25/2020  (Originally 03/15/2020)  . TETANUS/TDAP  08/15/2020  . DEXA SCAN  Completed  . COVID-19 Vaccine  Completed  . PNA vac Low Risk Adult  Completed    Dental Screening: Recommended annual dental exams for proper oral hygiene. Visits every 6-12 months.   Community Resource Referral / Chronic Care Management: CRR required this visit?  No   CCM required this visit?  No      Plan:   Keep all routine maintenance appointments.   Follow up 04/01/20 @ 11:00  I have personally reviewed and noted the following in the patient's chart:   . Medical and social history . Use of alcohol, tobacco or illicit drugs  . Current medications and supplements . Functional ability and status . Nutritional status . Physical activity . Advanced directives . List of other physicians . Hospitalizations, surgeries, and ER visits in previous 12 months . Vitals . Screenings to include cognitive, depression, and falls . Referrals and appointments  In addition, I have reviewed and discussed with patient certain preventive protocols, quality metrics, and best practice recommendations. A written personalized care plan for preventive services as well as general preventive health recommendations were provided to patient via mail.     Varney Biles, LPN   1/60/1093    Reviewed above information.  Agree with assessment and plan.    Dr Nicki Reaper

## 2020-03-25 NOTE — Patient Instructions (Addendum)
Jennifer Bernard , Thank you for taking time to come for your Medicare Wellness Visit. I appreciate your ongoing commitment to your health goals. Please review the following plan we discussed and let me know if I can assist you in the future.   These are the goals we discussed: Goals    . Maintain Healthy Lifestyle     Stay active  Stay hydrated Healthy diet       This is a list of the screening recommended for you and due dates:  Health Maintenance  Topic Date Due  . Flu Shot  06/25/2020*  . Tetanus Vaccine  08/15/2020  . DEXA scan (bone density measurement)  Completed  . COVID-19 Vaccine  Completed  . Pneumonia vaccines  Completed  *Topic was postponed. The date shown is not the original due date.    Immunizations Immunization History  Administered Date(s) Administered  . Fluad Quad(high Dose 65+) 05/20/2019  . Influenza-Unspecified 05/28/2015, 05/15/2016, 05/09/2017  . PFIZER SARS-COV-2 Vaccination 08/21/2019, 09/11/2019  . Pneumococcal Conjugate-13 04/16/2014  . Pneumococcal Polysaccharide-23 08/15/2009  . Tdap 08/15/2010  . Zoster 02/27/2010  . Zoster Recombinat (Shingrix) 02/28/2019    Advanced directives: End of life planning; Advance aging; Advanced directives discussed.  Copy of current HCPOA/Living Will requested.    Conditions/risks identified: none new  Follow up in one year for your annual wellness visit    Preventive Care 84 Years and Older, Female Preventive care refers to lifestyle choices and visits with your health care provider that can promote health and wellness. What does preventive care include?  A yearly physical exam. This is also called an annual well check.  Dental exams once or twice a year.  Routine eye exams. Ask your health care provider how often you should have your eyes checked.  Personal lifestyle choices, including:  Daily care of your teeth and gums.  Regular physical activity.  Eating a healthy diet.  Avoiding tobacco and  drug use.  Limiting alcohol use.  Practicing safe sex.  Taking low-dose aspirin every day.  Taking vitamin and mineral supplements as recommended by your health care provider. What happens during an annual well check? The services and screenings done by your health care provider during your annual well check will depend on your age, overall health, lifestyle risk factors, and family history of disease. Counseling  Your health care provider may ask you questions about your:  Alcohol use.  Tobacco use.  Drug use.  Emotional well-being.  Home and relationship well-being.  Sexual activity.  Eating habits.  History of falls.  Memory and ability to understand (cognition).  Work and work Statistician.  Reproductive health. Screening  You may have the following tests or measurements:  Height, weight, and BMI.  Blood pressure.  Lipid and cholesterol levels. These may be checked every 5 years, or more frequently if you are over 45 years old.  Skin check.  Lung cancer screening. You may have this screening every year starting at age 84 if you have a 30-pack-year history of smoking and currently smoke or have quit within the past 15 years.  Fecal occult blood test (FOBT) of the stool. You may have this test every year starting at age 35.  Flexible sigmoidoscopy or colonoscopy. You may have a sigmoidoscopy every 5 years or a colonoscopy every 10 years starting at age 51.  Hepatitis C blood test.  Hepatitis B blood test.  Sexually transmitted disease (STD) testing.  Diabetes screening. This is done by checking your blood  sugar (glucose) after you have not eaten for a while (fasting). You may have this done every 1-3 years.  Bone density scan. This is done to screen for osteoporosis. You may have this done starting at age 73.  Mammogram. This may be done every 1-2 years. Talk to your health care provider about how often you should have regular mammograms. Talk with your  health care provider about your test results, treatment options, and if necessary, the need for more tests. Vaccines  Your health care provider may recommend certain vaccines, such as:  Influenza vaccine. This is recommended every year.  Tetanus, diphtheria, and acellular pertussis (Tdap, Td) vaccine. You may need a Td booster every 10 years.  Zoster vaccine. You may need this after age 62.  Pneumococcal 13-valent conjugate (PCV13) vaccine. One dose is recommended after age 50.  Pneumococcal polysaccharide (PPSV23) vaccine. One dose is recommended after age 79. Talk to your health care provider about which screenings and vaccines you need and how often you need them. This information is not intended to replace advice given to you by your health care provider. Make sure you discuss any questions you have with your health care provider. Document Released: 08/28/2015 Document Revised: 04/20/2016 Document Reviewed: 06/02/2015 Elsevier Interactive Patient Education  2017 [employer] Prevention in the Home Falls can cause injuries. They can happen to people of all ages. There are many things you can do to make your home safe and to help prevent falls. What can I do on the outside of my home?  Regularly fix the edges of walkways and driveways and fix any cracks.  Remove anything that might make you trip as you walk through a door, such as a raised step or threshold.  Trim any bushes or trees on the path to your home.  Use bright outdoor lighting.  Clear any walking paths of anything that might make someone trip, such as rocks or tools.  Regularly check to see if handrails are loose or broken. Make sure that both sides of any steps have handrails.  Any raised decks and porches should have guardrails on the edges.  Have any leaves, snow, or ice cleared regularly.  Use sand or salt on walking paths during winter.  Clean up any spills in your garage right away. This includes oil  or grease spills. What can I do in the bathroom?  Use night lights.  Install grab bars by the toilet and in the tub and shower. Do not use towel bars as grab bars.  Use non-skid mats or decals in the tub or shower.  If you need to sit down in the shower, use a plastic, non-slip stool.  Keep the floor dry. Clean up any water that spills on the floor as soon as it happens.  Remove soap buildup in the tub or shower regularly.  Attach bath mats securely with double-sided non-slip rug tape.  Do not have throw rugs and other things on the floor that can make you trip. What can I do in the bedroom?  Use night lights.  Make sure that you have a light by your bed that is easy to reach.  Do not use any sheets or blankets that are too big for your bed. They should not hang down onto the floor.  Have a firm chair that has side arms. You can use this for support while you get dressed.  Do not have throw rugs and other things on the floor that can  make you trip. What can I do in the kitchen?  Clean up any spills right away.  Avoid walking on wet floors.  Keep items that you use a lot in easy-to-reach places.  If you need to reach something above you, use a strong step stool that has a grab bar.  Keep electrical cords out of the way.  Do not use floor polish or wax that makes floors slippery. If you must use wax, use non-skid floor wax.  Do not have throw rugs and other things on the floor that can make you trip. What can I do with my stairs?  Do not leave any items on the stairs.  Make sure that there are handrails on both sides of the stairs and use them. Fix handrails that are broken or loose. Make sure that handrails are as long as the stairways.  Check any carpeting to make sure that it is firmly attached to the stairs. Fix any carpet that is loose or worn.  Avoid having throw rugs at the top or bottom of the stairs. If you do have throw rugs, attach them to the floor with  carpet tape.  Make sure that you have a light switch at the top of the stairs and the bottom of the stairs. If you do not have them, ask someone to add them for you. What else can I do to help prevent falls?  Wear shoes that:  Do not have high heels.  Have rubber bottoms.  Are comfortable and fit you well.  Are closed at the toe. Do not wear sandals.  If you use a stepladder:  Make sure that it is fully opened. Do not climb a closed stepladder.  Make sure that both sides of the stepladder are locked into place.  Ask someone to hold it for you, if possible.  Clearly mark and make sure that you can see:  Any grab bars or handrails.  First and last steps.  Where the edge of each step is.  Use tools that help you move around (mobility aids) if they are needed. These include:  Canes.  Walkers.  Scooters.  Crutches.  Turn on the lights when you go into a dark area. Replace any light bulbs as soon as they burn out.  Set up your furniture so you have a clear path. Avoid moving your furniture around.  If any of your floors are uneven, fix them.  If there are any pets around you, be aware of where they are.  Review your medicines with your doctor. Some medicines can make you feel dizzy. This can increase your chance of falling. Ask your doctor what other things that you can do to help prevent falls. This information is not intended to replace advice given to you by your health care provider. Make sure you discuss any questions you have with your health care provider. Document Released: 05/28/2009 Document Revised: 01/07/2016 Document Reviewed: 09/05/2014 Elsevier Interactive Patient Education  2017 [employer].

## 2020-03-26 ENCOUNTER — Ambulatory Visit (INDEPENDENT_AMBULATORY_CARE_PROVIDER_SITE_OTHER): Payer: Medicare HMO | Admitting: Urology

## 2020-03-26 ENCOUNTER — Encounter: Payer: Self-pay | Admitting: Urology

## 2020-03-26 ENCOUNTER — Other Ambulatory Visit: Payer: Self-pay

## 2020-03-26 VITALS — BP 137/81 | HR 97 | Temp 98.1°F | Ht 63.0 in | Wt 144.0 lb

## 2020-03-26 DIAGNOSIS — R351 Nocturia: Secondary | ICD-10-CM

## 2020-03-26 DIAGNOSIS — Z87442 Personal history of urinary calculi: Secondary | ICD-10-CM | POA: Diagnosis not present

## 2020-03-26 DIAGNOSIS — N390 Urinary tract infection, site not specified: Secondary | ICD-10-CM

## 2020-03-26 NOTE — Progress Notes (Signed)
03/26/2020 9:21 AM   Jennifer Bernard November 01, 1926 [id number]  Referring provider: Einar Pheasant, [address] [place],  [place] [address]  Chief Complaint  Patient presents with  . Nocturia    Urologic history: 1.  Recurrent UTI  2.  History of stone disease -Ureteroscopic removal of a 7 mm left renal calculus 03/2015  HPI: 86 y.o. female presents for annual follow-up.   Since visit last year denies recurrent UTI  Complains of nocturia 1-2 at last years visit and was given trial of trospium at bedtime  Did not think this improved her symptoms  Still has stable nocturia at 1-2  Denies dysuria, gross hematuria  Denies flank, abdominal or pelvic pain   PMH: Past Medical History:  Diagnosis Date  . Asymptomatic menopausal state 02/20/1984   Overview:  Naturally occuring and asymptomatic Vaginal atrophy present on topical estrogen Overview:  Overview:  Naturally occuring and asymptomatic Vaginal atrophy present on topical estrogen  . Basal cell carcinoma of skin 02/19/2013   Overview:  Basal cell on her nasolabial fold 2014 S/p Mohs by Dr. Lacinda Axon Followed by local dermatologist regularly Overview:  Overview:  Followed by Dr. Lacinda Axon  . Cyst of ovary 02/19/2009   Overview:  Ovarian cyst on right stable 2004-2010 Cyst ? larger on CT 2012 Stable on follow up US Followed by Dr. Edwinna Areola Overview:  Overview:  Ovarian cyst on right stable 2004-2010 Cyst ? larger on CT 2012 Stable on follow up US Followed by Dr. Edwinna Areola  . Depression, major, single episode, complete remission ([place]) 07/18/2017  . Diffuse cystic mastopathy of breast 02/19/2013   Overview:  Mother with breast cancer later in life Two breast biopsies both with normal pathology Annual mammograms recommended, last 2016 Followed at the [facility]:  Relevant Hx: Course: Daily Update: Today's Plan: Overview:  Overview:  Mother with breast cancer later in life  Two breast biopsies both with normal pathology Annual mammograms recommended, last 50  . Essential (primary) hypertension 02/19/2013   Overview:  Goal blood pressure < 135/85 On atenolol Overview:  Overview:  Goal blood pressure < 135/85 On atenolol  . Hyperlipidemia 02/19/2013   Overview:  Goal LDL < 130, last value 92 in 2017 On Pravachol Overview:  Overview:  Goal LDL < 130, last value 82 in 2012 On Pravachol  . Hypothyroidism 02/28/2012   Overview:  started on T4  TFT normal 2017 Followed by Dr. Almon Hercules Overview:  Overview:  started on T4  TFT normal 2014 Followed by Dr. Almon Hercules  . Impaired glucose tolerance 02/20/2003   Overview:  Overview:  Glucose goal < 100, last value 94 in 2014 HgbA1c goal < 6.0, last value 5.3 in 2014 Urine microalbumin negative in 2014 On diet and exercise  . Nontoxic multinodular goiter 03/06/2003   Overview:  Asymptomatic nodular thyroid Stable [place] 2004-2014 Followed by Dr. Almon Hercules Overview:  Overview:  Asymptomatic nodular thyroid Stable [place] 2004-2014 Followed by Dr. Almon Hercules  . OSA on CPAP 01/13/2015   Overview:  Sleep study sent to medical records:   Sleep efficiency 22%, latency 24 min, # awakenings 11, res desat N=10, index 17.8  Apnea 1 for 16 sec, hypopnea 16 for 18 sec, total sleep time only 90 min so index = 11 She did not sleep much and the index puts her in mild category Repeat study a few days later when she slept 187 min her AHI = 4.6 which is normal, average  O2 sat 94% I don't think   . Osteoarthritis 02/20/2003   Overview:  Mild bilateral hands Right CMC Right hip Left knee s/p meniscus tear All symptomatically stable Overview:  Overview:  Mild bilateral hands Right CMC Right hip Left knee s/p meniscus tear All symptomatically stable  . Osteoporosis 02/20/2003   Overview:  Bone density 2004 LS -3.32 and RH-1.62 Bone density 2008 LS -3.14 and RH -1.22 Bone density 2009 LS -3.17 and RH -1.21 Bone density 2010 LS -3.14 and RH -1.42  Bone density 2014 LS -2.7 (L3 -3.1)  and RH -1.5 Bone density 2017 LS -2.3 and RH -1.4 (neck -1.7) Vitamin D level 38 in 2012 On Boniva 2004 to 2010, restarted 2015 Repeat bone density in 2020 and stop Boniva  Last Assessment & Pl  . Prediabetes 02/20/2003   Overview:  Glucose goal < 100, last value 91 in 2017 HgbA1c goal < 6.0, last value 5.6 in 2017 Urine microalbumin negative in 2017 On diet and exercise  . Recurrent UTI 10/19/2014  . Squamous cell carcinoma of skin 03/14/2019   Left mid brow. SCCis, hypertrophic. [facility]    Surgical History: Past Surgical History:  Procedure Laterality Date  . ABDOMINAL HYSTERECTOMY  1975  . URETEROSCOPY    . URETEROSCOPY WITH HOLMIUM LASER LITHOTRIPSY      Home Medications:  Allergies as of 03/26/2020      Reactions   Penicillins Hives   Other reaction(s): Unknown   Ceftin [cefuroxime Axetil] Diarrhea   GI distress   Ramipril    Other reaction(s): Cough Other reaction(s): Cough      Medication List       Accurate as of March 26, 2020  9:21 AM. If you have any questions, ask your nurse or doctor.        STOP taking these medications   mometasone 0.1 % cream Commonly known as: ELOCON Stopped by: Abbie Sons, MD   nystatin cream Commonly known as: MYCOSTATIN Stopped by: Abbie Sons, MD     TAKE these medications   amLODipine 2.5 MG tablet Commonly known as: NORVASC Take 1 tablet (2.5 mg total) by mouth daily.   escitalopram 10 MG tablet Commonly known as: LEXAPRO Take 1 tablet (10 mg total) by mouth daily.   levothyroxine 75 MCG tablet Commonly known as: SYNTHROID   multivitamin capsule Take by mouth.   pravastatin 40 MG tablet Commonly known as: PRAVACHOL Take 1 tablet (40 mg total) by mouth every evening.       Allergies:  Allergies  Allergen Reactions  . Penicillins Hives    Other reaction(s): Unknown  . Ceftin [Cefuroxime Axetil] Diarrhea    GI distress  . Ramipril     Other reaction(s): Cough Other reaction(s): Cough     Family  History: Family History  Problem Relation Age of Onset  . Breast cancer Mother   . Skin cancer Father     Social History:  reports that she has never smoked. She has never used smokeless tobacco. She reports that she does not drink alcohol and does not use drugs.   Physical Exam: BP 137/81   Pulse 97   Temp 98.1 F (36.7 C)   Ht 5\' 3"  (1.6 m)   Wt 144 lb (65.3 kg)   BMI 25.51 kg/m   Constitutional:  Alert and oriented, No acute distress. HEENT: [place] AT, moist mucus membranes.  Trachea midline, no masses. Cardiovascular: No clubbing, cyanosis, or edema. Respiratory: Normal respiratory effort, no increased work  of breathing.    Assessment & Plan:    1. Nocturia  Minimal nocturia 1-2  Would not recommend DDAVP in her age group  She still has trospium from prior Rx and would like to try again  No history of sleep apnea/snoring  2. Recurrent UTI  It has been more than 1 year since her last UTI  3.  History renal calculi  Asymptomatic   Abbie Sons, MD  [facility] [address], [place], [place] [address] [phone]

## 2020-04-01 ENCOUNTER — Telehealth (INDEPENDENT_AMBULATORY_CARE_PROVIDER_SITE_OTHER): Payer: Medicare HMO | Admitting: Internal Medicine

## 2020-04-01 ENCOUNTER — Other Ambulatory Visit: Payer: Self-pay

## 2020-04-01 DIAGNOSIS — R0981 Nasal congestion: Secondary | ICD-10-CM | POA: Diagnosis not present

## 2020-04-01 DIAGNOSIS — E039 Hypothyroidism, unspecified: Secondary | ICD-10-CM | POA: Diagnosis not present

## 2020-04-01 DIAGNOSIS — I10 Essential (primary) hypertension: Secondary | ICD-10-CM

## 2020-04-01 DIAGNOSIS — Z20822 Contact with and (suspected) exposure to covid-19: Secondary | ICD-10-CM | POA: Diagnosis not present

## 2020-04-01 DIAGNOSIS — E785 Hyperlipidemia, unspecified: Secondary | ICD-10-CM

## 2020-04-01 NOTE — Progress Notes (Signed)
Patient ID: Jennifer Bernard, female   DOB: 15-Sep-1926, 86 y.o.   MRN: [id number]   Virtual Visit via telephone Note  This visit type was conducted due to national recommendations for restrictions regarding the COVID-19 pandemic (e.g. social distancing).  This format is felt to be most appropriate for this patient at this time.  All issues noted in this document were discussed and addressed.  No physical exam was performed (except for noted visual exam findings with Video Visits).   I connected with Jennifer Bernard by telephone and verified that I am speaking with the correct person using two identifiers. Location patient: home Location provider: work Persons participating in the telephone visit: patient, provider  The limitations, risks, security and privacy concerns of performing an evaluation and management service by telephone and the availability of in person appointments have been discussed.  It has also been discussed with the patient that there may be a patient responsible charge related to this service. The patient expressed understanding and agreed to proceed.   Reason for visit: scheduled follow up.   HPI: She was scheduled for a regular f/u, but has noticed increased nasal congestion and cough.  States symptoms started Friday 8/13/21or Saturday 03/28/20.  Reports noticing decreased energy.  No documented fever.  Has not felt feverish.  Increased nasal congestion.  Previous sore throat.  This is better.  Eating, but reports decreased appetite.  No vomiting.  Minimal nausea.  No bowel change.  No increased sob.  Does reports some cough.  Her son is staying with her.  Prior to her getting sick, he had a sore throat.  She has had her covid vaccines.     ROS: See pertinent positives and negatives per HPI.  Past Medical History:  Diagnosis Date  . Asymptomatic menopausal state 02/20/1984   Overview:  Naturally occuring and asymptomatic Vaginal atrophy present on topical estrogen  Overview:  Overview:  Naturally occuring and asymptomatic Vaginal atrophy present on topical estrogen  . Basal cell carcinoma of skin 02/19/2013   Overview:  Basal cell on her nasolabial fold 2014 S/p Mohs by Dr. Lacinda Axon Followed by local dermatologist regularly Overview:  Overview:  Followed by Dr. Lacinda Axon  . Cyst of ovary 02/19/2009   Overview:  Ovarian cyst on right stable 2004-2010 Cyst ? larger on CT 2012 Stable on follow up US Followed by Dr. Edwinna Areola Overview:  Overview:  Ovarian cyst on right stable 2004-2010 Cyst ? larger on CT 2012 Stable on follow up US Followed by Dr. Edwinna Areola  . Depression, major, single episode, complete remission ([place]) 07/18/2017  . Diffuse cystic mastopathy of breast 02/19/2013   Overview:  Mother with breast cancer later in life Two breast biopsies both with normal pathology Annual mammograms recommended, last 2016 Followed at the [facility]:  Relevant Hx: Course: Daily Update: Today's Plan: Overview:  Overview:  Mother with breast cancer later in life Two breast biopsies both with normal pathology Annual mammograms recommended, last 32  . Essential (primary) hypertension 02/19/2013   Overview:  Goal blood pressure < 135/85 On atenolol Overview:  Overview:  Goal blood pressure < 135/85 On atenolol  . Hyperlipidemia 02/19/2013   Overview:  Goal LDL < 130, last value 92 in 2017 On Pravachol Overview:  Overview:  Goal LDL < 130, last value 82 in 2012 On Pravachol  . Hypothyroidism 02/28/2012   Overview:  started on T4  TFT normal 2017 Followed by Dr. Almon Hercules Overview:  Overview:  started on T4  TFT normal 2014 Followed by Dr. Almon Hercules  . Impaired glucose tolerance 02/20/2003   Overview:  Overview:  Glucose goal < 100, last value 94 in 2014 HgbA1c goal < 6.0, last value 5.3 in 2014 Urine microalbumin negative in 2014 On diet and exercise  . Nontoxic multinodular goiter 03/06/2003   Overview:  Asymptomatic nodular thyroid Stable [place] 2004-2014  Followed by Dr. Almon Hercules Overview:  Overview:  Asymptomatic nodular thyroid Stable [place] 2004-2014 Followed by Dr. Almon Hercules  . OSA on CPAP 01/13/2015   Overview:  Sleep study sent to medical records:   Sleep efficiency 22%, latency 24 min, # awakenings 11, res desat N=10, index 17.8  Apnea 1 for 16 sec, hypopnea 16 for 18 sec, total sleep time only 90 min so index = 11 She did not sleep much and the index puts her in mild category Repeat study a few days later when she slept 187 min her AHI = 4.6 which is normal, average O2 sat 94% I don't think   . Osteoarthritis 02/20/2003   Overview:  Mild bilateral hands Right CMC Right hip Left knee s/p meniscus tear All symptomatically stable Overview:  Overview:  Mild bilateral hands Right CMC Right hip Left knee s/p meniscus tear All symptomatically stable  . Osteoporosis 02/20/2003   Overview:  Bone density 2004 LS -3.32 and RH-1.62 Bone density 2008 LS -3.14 and RH -1.22 Bone density 2009 LS -3.17 and RH -1.21 Bone density 2010 LS -3.14 and RH -1.42  Bone density 2014 LS -2.7 (L3 -3.1) and RH -1.5 Bone density 2017 LS -2.3 and RH -1.4 (neck -1.7) Vitamin D level 38 in 2012 On Boniva 2004 to 2010, restarted 2015 Repeat bone density in 2020 and stop Boniva  Last Assessment & Pl  . Prediabetes 02/20/2003   Overview:  Glucose goal < 100, last value 91 in 2017 HgbA1c goal < 6.0, last value 5.6 in 2017 Urine microalbumin negative in 2017 On diet and exercise  . Recurrent UTI 10/19/2014  . Squamous cell carcinoma of skin 03/14/2019   Left mid brow. SCCis, hypertrophic. [facility]    Past Surgical History:  Procedure Laterality Date  . ABDOMINAL HYSTERECTOMY  1975  . URETEROSCOPY    . URETEROSCOPY WITH HOLMIUM LASER LITHOTRIPSY      Family History  Problem Relation Age of Onset  . Breast cancer Mother   . Skin cancer Father     SOCIAL HX: reviewed.    Current Outpatient Medications:  .  amLODipine (NORVASC) 2.5 MG tablet, Take 1 tablet (2.5 mg total) by mouth daily.,  Disp: 90 tablet, Rfl: 1 .  escitalopram (LEXAPRO) 10 MG tablet, Take 1 tablet (10 mg total) by mouth daily., Disp: 90 tablet, Rfl: 1 .  levothyroxine (SYNTHROID, LEVOTHROID) 75 MCG tablet, , Disp: , Rfl:  .  Multiple Vitamin (MULTIVITAMIN) capsule, Take by mouth., Disp: , Rfl:  .  pravastatin (PRAVACHOL) 40 MG tablet, Take 1 tablet (40 mg total) by mouth every evening., Disp: 90 tablet, Rfl: 1  EXAM:  GENERAL: alert. Sounds to be in no acute distress.  Answering questions appropriately.   PSYCH/NEURO: pleasant and cooperative, no obvious depression or anxiety, speech and thought processing grossly intact  ASSESSMENT AND PLAN:  Discussed the following assessment and plan:  Nasal congestion Increased nasal congestion and cough as outlined.  No increased sob or chest pain.  Decreased energy and decreased appetite, but she is eating and drinking. Discussed staying hydrated.  Nasacort nasal spray  and saline nasal spray as directed.  Robitussin DM as directed.  Discussed the need for continued quarantine.  Advised son to be tested.  Scheduled for covid test today.  Call with update.    Hypothyroidism On thyroid replacement.  Follow tsh.   Hyperlipidemia On pravastatin.  Low cholesterol diet and exercise.  Follow lipid panel and liver function tests.    Essential (primary) hypertension On amlodipine.  Follow pressures.  Follow metabolic panel.      I discussed the assessment and treatment plan with the patient. The patient was provided an opportunity to ask questions and all were answered. The patient agreed with the plan and demonstrated an understanding of the instructions.   The patient was advised to call back or seek an in-person evaluation if the symptoms worsen or if the condition fails to improve as anticipated.  I provided 23 minutes of non-face-to-face time during this encounter.   Einar Pheasant, MD

## 2020-04-02 ENCOUNTER — Encounter: Payer: Self-pay | Admitting: Internal Medicine

## 2020-04-02 ENCOUNTER — Telehealth: Payer: Self-pay | Admitting: Internal Medicine

## 2020-04-02 DIAGNOSIS — R0981 Nasal congestion: Secondary | ICD-10-CM | POA: Insufficient documentation

## 2020-04-02 LAB — NOVEL CORONAVIRUS, NAA: SARS-CoV-2, NAA: NOT DETECTED

## 2020-04-02 LAB — SARS-COV-2, NAA 2 DAY TAT

## 2020-04-02 NOTE — Assessment & Plan Note (Signed)
On pravastatin.  Low cholesterol diet and exercise.  Follow lipid panel and liver function tests.   

## 2020-04-02 NOTE — Assessment & Plan Note (Signed)
Increased nasal congestion and cough as outlined.  No increased sob or chest pain.  Decreased energy and decreased appetite, but she is eating and drinking. Discussed staying hydrated.  Nasacort nasal spray and saline nasal spray as directed.  Robitussin DM as directed.  Discussed the need for continued quarantine.  Advised son to be tested.  Scheduled for covid test today.  Call with update.

## 2020-04-02 NOTE — Assessment & Plan Note (Signed)
On amlodipine.  Follow pressures.  Follow metabolic panel.  

## 2020-04-02 NOTE — Telephone Encounter (Signed)
Negative COVID results given. Patient results "NOT Detected." Caller expressed understanding. ° °

## 2020-04-02 NOTE — Assessment & Plan Note (Signed)
On thyroid replacement.  Follow tsh.  

## 2020-04-07 ENCOUNTER — Ambulatory Visit: Payer: Medicare HMO

## 2020-04-10 ENCOUNTER — Other Ambulatory Visit: Payer: Self-pay

## 2020-04-10 ENCOUNTER — Encounter: Payer: Self-pay | Admitting: Internal Medicine

## 2020-04-10 ENCOUNTER — Telehealth (INDEPENDENT_AMBULATORY_CARE_PROVIDER_SITE_OTHER): Payer: Medicare HMO | Admitting: Internal Medicine

## 2020-04-10 ENCOUNTER — Ambulatory Visit
Admission: RE | Admit: 2020-04-10 | Discharge: 2020-04-10 | Disposition: A | Discharge status: Home / Self Care | Payer: Medicare HMO | Attending: Internal Medicine | Admitting: Internal Medicine

## 2020-04-10 ENCOUNTER — Ambulatory Visit
Admission: RE | Admit: 2020-04-10 | Discharge: 2020-04-10 | Disposition: A | Discharge status: Home / Self Care | Payer: Medicare HMO | Source: Ambulatory Visit | Attending: Internal Medicine | Admitting: Internal Medicine

## 2020-04-10 VITALS — BP 149/84 | HR 88 | Temp 98.9°F | Ht 63.0 in | Wt 144.0 lb

## 2020-04-10 DIAGNOSIS — R05 Cough: Secondary | ICD-10-CM | POA: Insufficient documentation

## 2020-04-10 DIAGNOSIS — J90 Pleural effusion, not elsewhere classified: Secondary | ICD-10-CM | POA: Diagnosis not present

## 2020-04-10 DIAGNOSIS — R053 Chronic cough: Secondary | ICD-10-CM

## 2020-04-10 DIAGNOSIS — E039 Hypothyroidism, unspecified: Secondary | ICD-10-CM | POA: Diagnosis not present

## 2020-04-10 DIAGNOSIS — I10 Essential (primary) hypertension: Secondary | ICD-10-CM | POA: Diagnosis not present

## 2020-04-10 DIAGNOSIS — R0981 Nasal congestion: Secondary | ICD-10-CM

## 2020-04-10 DIAGNOSIS — R059 Cough, unspecified: Secondary | ICD-10-CM

## 2020-04-10 DIAGNOSIS — E785 Hyperlipidemia, unspecified: Secondary | ICD-10-CM | POA: Diagnosis not present

## 2020-04-10 NOTE — Progress Notes (Signed)
Patient ID: Jennifer Bernard, female   DOB: 1927-01-28, 86 y.o.   MRN: [id number]   Virtual Visit via telephone Note  This visit type was conducted due to national recommendations for restrictions regarding the COVID-19 pandemic (e.g. social distancing).  This format is felt to be most appropriate for this patient at this time.  All issues noted in this document were discussed and addressed.  No physical exam was performed (except for noted visual exam findings with Video Visits).   I connected with Jennifer Bernard by telephone and verified that I am speaking with the correct person using two identifiers. Location patient: home Location provider: work Persons participating in the telephone visit: patient, provider  The limitations, risks, security and privacy concerns of performing an evaluation and management service by telephone and the availability of in person appointments have been discussed.  It has also been discussed with the patient that there may be a patient responsible charge related to this service. The patient expressed understanding and agreed to proceed.   Reason for visit: work in appt.    HPI: Per last note, she started having symptoms 03/26/20 - 03/28/20 - increased nasal congestion and cough.  Some decreased appetite.  Previous sore throat.  Better now.  Treated with nasacort nasal spray, saline nasal spray and robitusin DM.  She reports decreased cough.  Not as productive.  No chest tightness or sob.  No vomiting.  Minimal diarrhea.  No fever.  Some minimal cough.  Is feeling better.  Negative covid test.    ROS: See pertinent positives and negatives per HPI.   Past Medical History:  Diagnosis Date  . Asymptomatic menopausal state 02/20/1984   Overview:  Naturally occuring and asymptomatic Vaginal atrophy present on topical estrogen Overview:  Overview:  Naturally occuring and asymptomatic Vaginal atrophy present on topical estrogen  . Basal cell carcinoma of skin  02/19/2013   Overview:  Basal cell on her nasolabial fold 2014 S/p Mohs by Dr. Lacinda Axon Followed by local dermatologist regularly Overview:  Overview:  Followed by Dr. Lacinda Axon  . Cyst of ovary 02/19/2009   Overview:  Ovarian cyst on right stable 2004-2010 Cyst ? larger on CT 2012 Stable on follow up US Followed by Dr. Edwinna Areola Overview:  Overview:  Ovarian cyst on right stable 2004-2010 Cyst ? larger on CT 2012 Stable on follow up US Followed by Dr. Edwinna Areola  . Depression, major, single episode, complete remission ([place]) 07/18/2017  . Diffuse cystic mastopathy of breast 02/19/2013   Overview:  Mother with breast cancer later in life Two breast biopsies both with normal pathology Annual mammograms recommended, last 2016 Followed at the [facility]:  Relevant Hx: Course: Daily Update: Today's Plan: Overview:  Overview:  Mother with breast cancer later in life Two breast biopsies both with normal pathology Annual mammograms recommended, last 33  . Essential (primary) hypertension 02/19/2013   Overview:  Goal blood pressure < 135/85 On atenolol Overview:  Overview:  Goal blood pressure < 135/85 On atenolol  . Hyperlipidemia 02/19/2013   Overview:  Goal LDL < 130, last value 92 in 2017 On Pravachol Overview:  Overview:  Goal LDL < 130, last value 82 in 2012 On Pravachol  . Hypothyroidism 02/28/2012   Overview:  started on T4  TFT normal 2017 Followed by Dr. Almon Hercules Overview:  Overview:  started on T4  TFT normal 2014 Followed by Dr. Almon Hercules  . Impaired glucose tolerance 02/20/2003   Overview:  Overview:  Glucose goal < 100, last value 94 in 2014 HgbA1c goal < 6.0, last value 5.3 in 2014 Urine microalbumin negative in 2014 On diet and exercise  . Nontoxic multinodular goiter 03/06/2003   Overview:  Asymptomatic nodular thyroid Stable [place] 2004-2014 Followed by Dr. Almon Hercules Overview:  Overview:  Asymptomatic nodular thyroid Stable [place] 2004-2014 Followed by Dr. Almon Hercules  . OSA on CPAP  01/13/2015   Overview:  Sleep study sent to medical records:   Sleep efficiency 22%, latency 24 min, # awakenings 11, res desat N=10, index 17.8  Apnea 1 for 16 sec, hypopnea 16 for 18 sec, total sleep time only 90 min so index = 11 She did not sleep much and the index puts her in mild category Repeat study a few days later when she slept 187 min her AHI = 4.6 which is normal, average O2 sat 94% I don't think   . Osteoarthritis 02/20/2003   Overview:  Mild bilateral hands Right CMC Right hip Left knee s/p meniscus tear All symptomatically stable Overview:  Overview:  Mild bilateral hands Right CMC Right hip Left knee s/p meniscus tear All symptomatically stable  . Osteoporosis 02/20/2003   Overview:  Bone density 2004 LS -3.32 and RH-1.62 Bone density 2008 LS -3.14 and RH -1.22 Bone density 2009 LS -3.17 and RH -1.21 Bone density 2010 LS -3.14 and RH -1.42  Bone density 2014 LS -2.7 (L3 -3.1) and RH -1.5 Bone density 2017 LS -2.3 and RH -1.4 (neck -1.7) Vitamin D level 38 in 2012 On Boniva 2004 to 2010, restarted 2015 Repeat bone density in 2020 and stop Boniva  Last Assessment & Pl  . Prediabetes 02/20/2003   Overview:  Glucose goal < 100, last value 91 in 2017 HgbA1c goal < 6.0, last value 5.6 in 2017 Urine microalbumin negative in 2017 On diet and exercise  . Recurrent UTI 10/19/2014  . Squamous cell carcinoma of skin 03/14/2019   Left mid brow. SCCis, hypertrophic. [facility]    Past Surgical History:  Procedure Laterality Date  . ABDOMINAL HYSTERECTOMY  1975  . URETEROSCOPY    . URETEROSCOPY WITH HOLMIUM LASER LITHOTRIPSY      Family History  Problem Relation Age of Onset  . Breast cancer Mother   . Skin cancer Father     SOCIAL HX: reviewed.    Current Outpatient Medications:  .  amLODipine (NORVASC) 2.5 MG tablet, Take 1 tablet (2.5 mg total) by mouth daily., Disp: 90 tablet, Rfl: 1 .  escitalopram (LEXAPRO) 10 MG tablet, Take 1 tablet (10 mg total) by mouth daily., Disp: 90 tablet, Rfl: 1 .   levothyroxine (SYNTHROID, LEVOTHROID) 75 MCG tablet, , Disp: , Rfl:  .  Multiple Vitamin (MULTIVITAMIN) capsule, Take by mouth., Disp: , Rfl:  .  pravastatin (PRAVACHOL) 40 MG tablet, Take 1 tablet (40 mg total) by mouth every evening., Disp: 90 tablet, Rfl: 1  EXAM:  GENERAL: alert.  Sounds to be in no acute distress.  Answering questions appropriately.    PSYCH/NEURO: pleasant and cooperative, no obvious depression or anxiety, speech and thought processing grossly intact  ASSESSMENT AND PLAN:  Discussed the following assessment and plan:  Nasal congestion Decreased cough.  Not as productive.  No chest tightness or increased sob.  Previous nasal congestion.  nasacort nasal spray and saline nasal spray as outlined.  Check cxr.  Has improved.  Follow.    Hypothyroidism On thyroid replacement.  Follow tsh.   Hyperlipidemia On pravastatin.  Low cholesterol diet  and exercise.  Follow lipid panel and liver function tests.    Essential (primary) hypertension Blood pressures have been doing well on amlodipine.  Follow pressures.  Follow metabolic panel.   Cough Minimal cough.  Has improved. Not as productive.  Treat drainage as outlined.  No sob.  Check cxr.  Follow. covid test negative.     Orders Placed This Encounter  Procedures  . DG Chest 2 View    Standing Status:   Future    Number of Occurrences:   1    Standing Expiration Date:   04/10/2021    Order Specific Question:   Reason for Exam (SYMPTOM  OR DIAGNOSIS REQUIRED)    Answer:   persisent cough and congesiton.    Order Specific Question:   Preferred imaging location?    Answer:   [facility]    Order Specific Question:   Radiology Contrast Protocol - do NOT remove file path    Answer:   [email or web address]    I discussed the assessment and treatment plan with the patient. The patient was provided an opportunity to ask questions and all were answered. The patient  agreed with the plan and demonstrated an understanding of the instructions.   The patient was advised to call back or seek an in-person evaluation if the symptoms worsen or if the condition fails to improve as anticipated.  I provided 23 minutes of non-face-to-face time during this encounter.   Einar Pheasant, MD

## 2020-04-19 ENCOUNTER — Encounter: Payer: Self-pay | Admitting: Internal Medicine

## 2020-04-19 DIAGNOSIS — R059 Cough, unspecified: Secondary | ICD-10-CM | POA: Insufficient documentation

## 2020-04-19 NOTE — Assessment & Plan Note (Signed)
Decreased cough.  Not as productive.  No chest tightness or increased sob.  Previous nasal congestion.  nasacort nasal spray and saline nasal spray as outlined.  Check cxr.  Has improved.  Follow.

## 2020-04-19 NOTE — Assessment & Plan Note (Signed)
Minimal cough.  Has improved. Not as productive.  Treat drainage as outlined.  No sob.  Check cxr.  Follow. covid test negative.

## 2020-04-19 NOTE — Assessment & Plan Note (Signed)
On thyroid replacement.  Follow tsh.  

## 2020-04-19 NOTE — Assessment & Plan Note (Signed)
Blood pressures have been doing well on amlodipine.  Follow pressures.  Follow metabolic panel.

## 2020-04-19 NOTE — Assessment & Plan Note (Signed)
On pravastatin.  Low cholesterol diet and exercise.  Follow lipid panel and liver function tests.   

## 2020-04-30 DIAGNOSIS — H5000 Unspecified esotropia: Secondary | ICD-10-CM | POA: Diagnosis not present

## 2020-04-30 DIAGNOSIS — H04123 Dry eye syndrome of bilateral lacrimal glands: Secondary | ICD-10-CM | POA: Diagnosis not present

## 2020-05-06 ENCOUNTER — Telehealth: Payer: Self-pay | Admitting: Internal Medicine

## 2020-05-06 NOTE — Telephone Encounter (Signed)
Patient called and said she thinks she has a infection in her finger. Patient was offered a virtual with Jerelene Redden on Friday, patient did not want a virtual. At the time of call no other provider had any available appointments. Patient was advised to go to Urgent Care. She stated she did but there was a 3 hour wait and she did not want to wait.

## 2020-05-07 ENCOUNTER — Other Ambulatory Visit: Payer: Self-pay | Admitting: Dermatology

## 2020-05-07 ENCOUNTER — Other Ambulatory Visit: Payer: Self-pay

## 2020-05-07 ENCOUNTER — Ambulatory Visit (INDEPENDENT_AMBULATORY_CARE_PROVIDER_SITE_OTHER): Payer: Medicare HMO | Admitting: Dermatology

## 2020-05-07 DIAGNOSIS — L089 Local infection of the skin and subcutaneous tissue, unspecified: Secondary | ICD-10-CM

## 2020-05-07 DIAGNOSIS — L03012 Cellulitis of left finger: Secondary | ICD-10-CM | POA: Diagnosis not present

## 2020-05-07 DIAGNOSIS — L03019 Cellulitis of unspecified finger: Secondary | ICD-10-CM

## 2020-05-07 MED ORDER — DOXYCYCLINE MONOHYDRATE 100 MG PO CAPS: 100.0000 mg | ORAL_CAPSULE | Freq: Two times a day (BID) | ORAL | 0 refills | Status: DC

## 2020-05-07 MED ORDER — MUPIROCIN 2 % EX OINT: 1.0000 "application " | TOPICAL_OINTMENT | Freq: Every day | CUTANEOUS | 0 refills | Status: DC

## 2020-05-07 NOTE — Telephone Encounter (Signed)
LMTCB

## 2020-05-07 NOTE — Progress Notes (Signed)
   Follow-Up Visit   Subjective  Jennifer Bernard is a 86 y.o. female who presents for the following: Skin Problem.  Patient here today to have a spot at left index finger checked. Patient went to her PCP in July and was given Levaquin, nystatin and mometasone. The levaquin caused her not to sleep so she didn't finish taking it. She just restarted the 2 topicals a few days ago.  When she saw Dr. [name] 03/12/20 he gave her a sample of Zilixi foam to try but she said it did not help.   The following portions of the chart were reviewed this encounter and updated as appropriate:  Tobacco  Allergies  Meds  Problems  Med Hx  Surg Hx  Fam Hx      Review of Systems:  No other skin or systemic complaints except as noted in HPI or Assessment and Plan.  Objective  Well appearing patient in no apparent distress; mood and affect are within normal limits.  A focused examination was performed including left hand and fingers. Relevant physical exam findings are noted in the Assessment and Plan.  Objective  Left 2nd Finger Proximal Nail Fold: Lake of pus within erythema  Objective  Left 2nd Finger Proximal Nail Fold: Erythema surrounding felon   Assessment & Plan  Felon of finger Left 2nd Finger Proximal Nail Fold  Advised this occurs secondary to infection.  Will perform incision and drainage today as well as culture  Incision and Drainage - Left 2nd Finger Proximal Nail Fold Location: left index finger  Informed Consent: Discussed risks (permanent scarring, light or dark discoloration, infection, pain, bleeding, bruising, redness, damage to adjacent structures, and recurrence of the lesion) and benefits of the procedure, as well as the alternatives.  Informed consent was obtained.  Preparation: The area was prepped with alcohol.  Anesthesia: Lidocaine 1% with epinephrine.   Procedure Details: An incision was made overlying the lesion. The lesion drained pus.  A small amount  of fluid was drained.    Antibiotic ointment and a sterile pressure dressing were applied. The patient tolerated procedure well.  Total number of lesions drained: 1  Plan: The patient was instructed on post-op care. Recommend OTC analgesia as needed for pain.   Local infection of skin and subcutaneous tissue Left 2nd Finger Proximal Nail Fold  Start Doxycycline monohydrate 100mg  one by mouth twice daily with food x 7 days. Start mupirocin ointment daily with dressing changes.   Doxycycline should be taken with food to prevent nausea. Do not lay down for 30 minutes after taking. Be cautious with sun exposure and use good sun protection while on this medication.   Other Related Procedures Anaerobic and Aerobic Culture  Ordered Medications: doxycycline (MONODOX) 100 MG capsule mupirocin ointment (BACTROBAN) 2 %  Return if symptoms worsen or fail to improve.  Graciella Belton, RMA, am acting as scribe for Forest Gleason, MD .  Documentation: I have reviewed the above documentation for accuracy and completeness, and I agree with the above.  Forest Gleason, MD

## 2020-05-08 NOTE — Telephone Encounter (Signed)
LMTCB

## 2020-05-11 ENCOUNTER — Telehealth: Payer: Self-pay | Admitting: Internal Medicine

## 2020-05-11 NOTE — Telephone Encounter (Addendum)
Patient called in wanted to know if she should get the booster shot and flu shot and should she get them both at the same time

## 2020-05-12 ENCOUNTER — Encounter: Payer: Self-pay | Admitting: Dermatology

## 2020-05-12 NOTE — Telephone Encounter (Signed)
Called patient. Unable to leave message.

## 2020-05-12 NOTE — Telephone Encounter (Signed)
It is ok to get both together, but I would recommend going ahead and getting the flu vaccine - while waiting for recommendations for covid booster.

## 2020-05-12 NOTE — Telephone Encounter (Signed)
I know per guidelines it is okay to get them together but wanted to know your recommendations. She had her vaccines early in the year so she is eligible to get the booster once ok for 81 and older. She is not immunocompromised.

## 2020-05-13 LAB — ANAEROBIC AND AEROBIC CULTURE

## 2020-05-13 NOTE — Progress Notes (Signed)
Bacterial culture at site of felon did not grow any bacteria.  However, given clinical presentation, would recommend finishing course of doxycycline due to high suspicion for bacterial infection clinically.  If she is having any pain or problems with the area, we may need to consider reevaluation and possible change in therapy and repeat culture.  The culture did grow yeast.  Would recommend adding ciclopirox cream twice a day to that finger until it feels normal like her other fingers. However, if this is expensive for the patient, she should call us and we will switch to another antifungal option.   As this heals, I will also recommend being sure she is wearing gloves when washing dishes or doing any cleaning around the house as anything that gets this finger moist over and over or gets soap or cleaning products in the area can cause irritation and keep the area chronically irritated (chronic paronychia).  Dr. Jerilynn Mages left voicemail 9/29 at 9:50 AM.  MAs please call.

## 2020-05-14 ENCOUNTER — Telehealth: Payer: Self-pay

## 2020-05-14 DIAGNOSIS — L089 Local infection of the skin and subcutaneous tissue, unspecified: Secondary | ICD-10-CM

## 2020-05-14 MED ORDER — CICLOPIROX OLAMINE 0.77 % EX CREA: TOPICAL_CREAM | Freq: Two times a day (BID) | CUTANEOUS | 0 refills | Status: DC

## 2020-05-14 NOTE — Telephone Encounter (Signed)
-----   Message from Alfonso Patten, MD sent at 05/13/2020  9:51 AM EDT ----- Bacterial culture at site of felon did not grow any bacteria.  However, given clinical presentation, would recommend finishing course of doxycycline due to high suspicion for bacterial infection clinically.  If she is having any pain or problems with the area, we may need to consider reevaluation and possible change in therapy and repeat culture.  The culture did grow yeast.  Would recommend adding ciclopirox cream twice a day to that finger until it feels normal like her other fingers. However, if this is expensive for the patient, she should call us and we will switch to another antifungal option.   As this heals, I will also recommend being sure she is wearing gloves when washing dishes or doing any cleaning around the house as anything that gets this finger moist over and over or gets soap or cleaning products in the area can cause irritation and keep the area chronically irritated (chronic paronychia).  Dr. Jerilynn Mages left voicemail 9/29 at 9:50 AM.  MAs please call.

## 2020-05-14 NOTE — Telephone Encounter (Signed)
Pt is wondering if she needs to get a refill of Doxycycline. She states that her finger has improved but that it is still swollen.

## 2020-05-14 NOTE — Telephone Encounter (Signed)
As long as it is doing better and the trapped area of pus (the yellow area) has not come back, we can refill the doxycycline for another week. She should call us or seek medical care if anything worsens or if she is very uncomfortable.   MAs please call. Thank you!

## 2020-05-14 NOTE — Telephone Encounter (Signed)
Advised that patient could have the booster if she had pfizer, 6 months after her second shot and 20 and older. She is going to hold off on getting her flu shot for now.

## 2020-05-14 NOTE — Telephone Encounter (Signed)
Patient advised culture did not grow bacteria, only yeast. She took her last doxycycline yesterday. Ciclopirox cream sent in and patient advised to wear gloves when doing dishes or anything that would keep finger wet, JS

## 2020-05-20 DIAGNOSIS — M25551 Pain in right hip: Secondary | ICD-10-CM | POA: Diagnosis not present

## 2020-05-20 DIAGNOSIS — E039 Hypothyroidism, unspecified: Secondary | ICD-10-CM | POA: Diagnosis not present

## 2020-05-20 DIAGNOSIS — M1611 Unilateral primary osteoarthritis, right hip: Secondary | ICD-10-CM | POA: Diagnosis not present

## 2020-05-20 DIAGNOSIS — M810 Age-related osteoporosis without current pathological fracture: Secondary | ICD-10-CM | POA: Diagnosis not present

## 2020-05-20 DIAGNOSIS — E042 Nontoxic multinodular goiter: Secondary | ICD-10-CM | POA: Diagnosis not present

## 2020-05-26 ENCOUNTER — Ambulatory Visit: Payer: Medicare HMO | Admitting: Dermatology

## 2020-06-03 ENCOUNTER — Other Ambulatory Visit: Payer: Self-pay

## 2020-06-03 ENCOUNTER — Encounter: Payer: Self-pay | Admitting: Urology

## 2020-06-03 ENCOUNTER — Ambulatory Visit (INDEPENDENT_AMBULATORY_CARE_PROVIDER_SITE_OTHER): Payer: Medicare HMO | Admitting: Urology

## 2020-06-03 VITALS — BP 148/86 | HR 94 | Ht 63.0 in | Wt 140.0 lb

## 2020-06-03 DIAGNOSIS — R351 Nocturia: Secondary | ICD-10-CM | POA: Diagnosis not present

## 2020-06-03 MED ORDER — TROSPIUM CHLORIDE 20 MG PO TABS: ORAL_TABLET | ORAL | 0 refills | Status: DC

## 2020-06-03 NOTE — Progress Notes (Signed)
06/03/2020 10:38 AM   [name] [id number] [id number]  Referring provider: Einar Pheasant, MD [address] [place],  [place] [address]  Chief Complaint  Patient presents with  . Nocturia    HPI: 86 y.o. female followed for recurrent UTI presents today for an acute visit for worsening nocturia.   Baseline nocturia 1-2  Last week had nocturia x4  Denies dysuria or gross hematuria  History of sleep apnea but not on CPAP  Describes voided volumes as small to moderate  Had taken immediate release trospium at bedtime but does not remember efficacy   PMH: Past Medical History:  Diagnosis Date  . Asymptomatic menopausal state 02/20/1984   Overview:  Naturally occuring and asymptomatic Vaginal atrophy present on topical estrogen Overview:  Overview:  Naturally occuring and asymptomatic Vaginal atrophy present on topical estrogen  . Basal cell carcinoma of skin 02/19/2013   Overview:  Basal cell on her nasolabial fold 2014 S/p Mohs by Dr. Lacinda Axon Followed by local dermatologist regularly Overview:  Overview:  Followed by Dr. Lacinda Axon  . Cyst of ovary 02/19/2009   Overview:  Ovarian cyst on right stable 2004-2010 Cyst ? larger on CT 2012 Stable on follow up US Followed by Dr. Edwinna Areola Overview:  Overview:  Ovarian cyst on right stable 2004-2010 Cyst ? larger on CT 2012 Stable on follow up US Followed by Dr. Edwinna Areola  . Depression, major, single episode, complete remission ([facility]) 07/18/2017  . Diffuse cystic mastopathy of breast 02/19/2013   Overview:  Mother with breast cancer later in life Two breast biopsies both with normal pathology Annual mammograms recommended, last 2016 Followed at the [facility]:  Relevant Hx: Course: Daily Update: Today's Plan: Overview:  Overview:  Mother with breast cancer later in life Two breast biopsies both with normal pathology Annual mammograms recommended, last 33  . Essential (primary)  hypertension 02/19/2013   Overview:  Goal blood pressure < 135/85 On atenolol Overview:  Overview:  Goal blood pressure < 135/85 On atenolol  . Hyperlipidemia 02/19/2013   Overview:  Goal LDL < 130, last value 92 in 2017 On Pravachol Overview:  Overview:  Goal LDL < 130, last value 82 in 2012 On Pravachol  . Hypothyroidism 02/28/2012   Overview:  started on T4  TFT normal 2017 Followed by Dr. Almon Hercules Overview:  Overview:  started on T4  TFT normal 2014 Followed by Dr. Almon Hercules  . Impaired glucose tolerance 02/20/2003   Overview:  Overview:  Glucose goal < 100, last value 94 in 2014 HgbA1c goal < 6.0, last value 5.3 in 2014 Urine microalbumin negative in 2014 On diet and exercise  . Nontoxic multinodular goiter 03/06/2003   Overview:  Asymptomatic nodular thyroid Stable [place] 2004-2014 Followed by Dr. Almon Hercules Overview:  Overview:  Asymptomatic nodular thyroid Stable [place] 2004-2014 Followed by Dr. Almon Hercules  . OSA on CPAP 01/13/2015   Overview:  Sleep study sent to medical records:   Sleep efficiency 22%, latency 24 min, # awakenings 11, res desat N=10, index 17.8  Apnea 1 for 16 sec, hypopnea 16 for 18 sec, total sleep time only 90 min so index = 11 She did not sleep much and the index puts her in mild category Repeat study a few days later when she slept 187 min her AHI = 4.6 which is normal, average O2 sat 94% I don't think   . Osteoarthritis 02/20/2003   Overview:  Mild bilateral hands Right CMC Right  hip Left knee s/p meniscus tear All symptomatically stable Overview:  Overview:  Mild bilateral hands Right CMC Right hip Left knee s/p meniscus tear All symptomatically stable  . Osteoporosis 02/20/2003   Overview:  Bone density 2004 LS -3.32 and RH-1.62 Bone density 2008 LS -3.14 and RH -1.22 Bone density 2009 LS -3.17 and RH -1.21 Bone density 2010 LS -3.14 and RH -1.42  Bone density 2014 LS -2.7 (L3 -3.1) and RH -1.5 Bone density 2017 LS -2.3 and RH -1.4 (neck -1.7) Vitamin D level 38 in 2012 On Boniva 2004 to  2010, restarted 2015 Repeat bone density in 2020 and stop Boniva  Last Assessment & Pl  . Prediabetes 02/20/2003   Overview:  Glucose goal < 100, last value 91 in 2017 HgbA1c goal < 6.0, last value 5.6 in 2017 Urine microalbumin negative in 2017 On diet and exercise  . Recurrent UTI 10/19/2014  . Squamous cell carcinoma of skin 03/14/2019   Left mid brow. SCCis, hypertrophic. [facility]    Surgical History: Past Surgical History:  Procedure Laterality Date  . ABDOMINAL HYSTERECTOMY  1975  . URETEROSCOPY    . URETEROSCOPY WITH HOLMIUM LASER LITHOTRIPSY      Home Medications:  Allergies as of 06/03/2020      Reactions   Penicillins Hives   Other reaction(s): Unknown   Ceftin [cefuroxime Axetil] Diarrhea   GI distress   Ramipril    Other reaction(s): Cough Other reaction(s): Cough      Medication List       Accurate as of June 03, 2020 10:38 AM. If you have any questions, ask your nurse or doctor.        STOP taking these medications   ciclopirox 0.77 % cream Commonly known as: LOPROX Stopped by: Abbie Sons, MD   mupirocin ointment 2 % Commonly known as: BACTROBAN Stopped by: Abbie Sons, MD     TAKE these medications   amLODipine 2.5 MG tablet Commonly known as: NORVASC Take 1 tablet (2.5 mg total) by mouth daily.   doxycycline 100 MG capsule Commonly known as: MONODOX Take 1 capsule (100 mg total) by mouth 2 (two) times daily. Take with food   escitalopram 10 MG tablet Commonly known as: LEXAPRO Take 1 tablet (10 mg total) by mouth daily.   levothyroxine 75 MCG tablet Commonly known as: SYNTHROID   multivitamin capsule Take by mouth.   pravastatin 40 MG tablet Commonly known as: PRAVACHOL Take 1 tablet (40 mg total) by mouth every evening.       Allergies:  Allergies  Allergen Reactions  . Penicillins Hives    Other reaction(s): Unknown  . Ceftin [Cefuroxime Axetil] Diarrhea    GI distress  . Ramipril     Other reaction(s): Cough Other  reaction(s): Cough     Family History: Family History  Problem Relation Age of Onset  . Breast cancer Mother   . Skin cancer Father     Social History:  reports that she has never smoked. She has never used smokeless tobacco. She reports that she does not drink alcohol and does not use drugs.   Physical Exam: BP (!) 148/86   Pulse 94   Ht 5\' 3"  (1.6 m)   Wt 140 lb (63.5 kg)   BMI 24.80 kg/m   Constitutional:  Alert and oriented, No acute distress. HEENT: [place] AT, moist mucus membranes.  Trachea midline, no masses. Cardiovascular: No clubbing, cyanosis, or edema. Respiratory: Normal respiratory effort, no increased work of  breathing. Skin: No rashes, bruises or suspicious lesions. Neurologic: Grossly intact, no focal deficits, moving all 4 extremities. Psychiatric: Normal mood and affect.   Assessment & Plan:    1. Nocturia  Worsening symptoms which are bothersome  We discussed multiple potential causes of nocturia including bladder overactivity, nocturnal polyuria and sleep apnea  Restart trospium 20 mg 1 hour prior to bedtime  Would not recommend DDAVP in her age group and Nocdurna would most likely be cost prohibitive  For persistent or worsening nocturia would recommend follow-up with PCP to consider CPAP for at least repeat sleep study  UA pending   Abbie Sons, [place] [address] [place], [place] [address] [phone]

## 2020-06-04 ENCOUNTER — Ambulatory Visit: Payer: Medicare HMO | Admitting: Dermatology

## 2020-06-04 ENCOUNTER — Encounter: Payer: Self-pay | Admitting: Dermatology

## 2020-06-04 DIAGNOSIS — L578 Other skin changes due to chronic exposure to nonionizing radiation: Secondary | ICD-10-CM

## 2020-06-04 DIAGNOSIS — Z872 Personal history of diseases of the skin and subcutaneous tissue: Secondary | ICD-10-CM | POA: Diagnosis not present

## 2020-06-04 DIAGNOSIS — L820 Inflamed seborrheic keratosis: Secondary | ICD-10-CM

## 2020-06-04 DIAGNOSIS — Z85828 Personal history of other malignant neoplasm of skin: Secondary | ICD-10-CM | POA: Diagnosis not present

## 2020-06-04 DIAGNOSIS — L988 Other specified disorders of the skin and subcutaneous tissue: Secondary | ICD-10-CM | POA: Diagnosis not present

## 2020-06-04 DIAGNOSIS — Z86007 Personal history of in-situ neoplasm of skin: Secondary | ICD-10-CM | POA: Diagnosis not present

## 2020-06-04 DIAGNOSIS — D485 Neoplasm of uncertain behavior of skin: Secondary | ICD-10-CM | POA: Diagnosis not present

## 2020-06-04 DIAGNOSIS — L570 Actinic keratosis: Secondary | ICD-10-CM

## 2020-06-04 DIAGNOSIS — Z1283 Encounter for screening for malignant neoplasm of skin: Secondary | ICD-10-CM | POA: Diagnosis not present

## 2020-06-04 DIAGNOSIS — D492 Neoplasm of unspecified behavior of bone, soft tissue, and skin: Secondary | ICD-10-CM

## 2020-06-04 DIAGNOSIS — L720 Epidermal cyst: Secondary | ICD-10-CM

## 2020-06-04 DIAGNOSIS — D229 Melanocytic nevi, unspecified: Secondary | ICD-10-CM

## 2020-06-04 DIAGNOSIS — D1800 Hemangioma unspecified site: Secondary | ICD-10-CM

## 2020-06-04 DIAGNOSIS — L821 Other seborrheic keratosis: Secondary | ICD-10-CM

## 2020-06-04 DIAGNOSIS — L814 Other melanin hyperpigmentation: Secondary | ICD-10-CM

## 2020-06-04 LAB — MICROSCOPIC EXAMINATION: Bacteria, UA: NONE SEEN

## 2020-06-04 LAB — URINALYSIS, COMPLETE
Bilirubin, UA: NEGATIVE
Glucose, UA: NEGATIVE
Ketones, UA: NEGATIVE
Leukocytes,UA: NEGATIVE
Nitrite, UA: NEGATIVE
Protein,UA: NEGATIVE
Specific Gravity, UA: 1.025 (ref 1.005–1.030)
Urobilinogen, Ur: 0.2 mg/dL (ref 0.2–1.0)
pH, UA: 6 (ref 5.0–7.5)

## 2020-06-04 NOTE — Patient Instructions (Signed)
Arazlo lotion for milia (white bumps) on face Apply a pea size amount to face nightly   Wound Care Instructions  1. Cleanse wound gently with soap and water once a day then pat dry with clean gauze. Apply a thing coat of Petrolatum (petroleum jelly, "Vaseline") over the wound (unless you have an allergy to this). We recommend that you use a new, sterile tube of Vaseline. Do not pick or remove scabs. Do not remove the yellow or white "healing tissue" from the base of the wound.  2. Cover the wound with fresh, clean, nonstick gauze and secure with paper tape. You may use Band-Aids in place of gauze and tape if the would is small enough, but would recommend trimming much of the tape off as there is often too much. Sometimes Band-Aids can irritate the skin.  3. You should call the office for your biopsy report after 1 week if you have not already been contacted.  4. If you experience any problems, such as abnormal amounts of bleeding, swelling, significant bruising, significant pain, or evidence of infection, please call the office immediately.  5. FOR ADULT SURGERY PATIENTS: If you need something for pain relief you may take 1 extra strength Tylenol (acetaminophen) AND 2 Ibuprofen (200mg  each) together every 4 hours as needed for pain. (do not take these if you are allergic to them or if you have a reason you should not take them.) Typically, you may only need pain medication for 1 to 3 days.

## 2020-06-04 NOTE — Progress Notes (Signed)
Follow-Up Visit   Subjective  Jennifer Bernard is a 86 y.o. female who presents for the following: Actinic Keratosis (L eye brow, 30m f/u), Seborrheic Keratosis (ISK L forehead 17m f/u), and Annual Exam (Total body skin exam). The patient presents for Total-Body Skin Exam (TBSE) for skin cancer screening and mole check.  The following portions of the chart were reviewed this encounter and updated as appropriate:  Tobacco  Allergies  Meds  Problems  Med Hx  Surg Hx  Fam Hx     Review of Systems:  No other skin or systemic complaints except as noted in HPI or Assessment and Plan.  Objective  Well appearing patient in no apparent distress; mood and affect are within normal limits.  A full examination was performed including scalp, head, eyes, ears, nose, lips, neck, chest, axillae, abdomen, back, buttocks, bilateral upper extremities, bilateral lower extremities, hands, feet, fingers, toes, fingernails, and toenails. All findings within normal limits unless otherwise noted below.  Objective  below L mid brow x 1, R temple x 1 (2): Pink scaly macules   Objective  face: White paps  Objective  Left low back lateral above the posterior waistline: Brown pap with surrounding hypopigmentation 1.1cm       Objective  L trunk x 7 (7): Erythematous keratotic or waxy stuck-on papule or plaque.   Objective  face: Rhytides and volume loss.    Assessment & Plan    History of Squamous Cell Carcinoma in Situ of the Skin - No evidence of recurrence today - Recommend regular full body skin exams - Recommend daily broad spectrum sunscreen SPF 30+ to sun-exposed areas, reapply every 2 hours as needed.  - Call if any new or changing lesions are noted between office visits - L mid brow  History of Basal Cell Carcinoma of the Skin - No evidence of recurrence today - Recommend regular full body skin exams - Recommend daily broad spectrum sunscreen SPF 30+ to sun-exposed areas,  reapply every 2 hours as needed.  - Call if any new or changing lesions are noted between office visits - Nasolabial fold  Lentigines - Scattered tan macules - Discussed due to sun exposure - Benign, observe - Call for any changes  Seborrheic Keratoses - Stuck-on, waxy, tan-brown papules and plaques  - Discussed benign etiology and prognosis. - Observe - Call for any changes  Melanocytic Nevi - Tan-brown and/or pink-flesh-colored symmetric macules and papules - Benign appearing on exam today - Observation - Call clinic for new or changing moles - Recommend daily use of broad spectrum spf 30+ sunscreen to sun-exposed areas.   Hemangiomas - Red papules - Discussed benign nature - Observe - Call for any changes  Actinic Damage - diffuse scaly erythematous macules with underlying dyspigmentation - Recommend daily broad spectrum sunscreen SPF 30+ to sun-exposed areas, reapply every 2 hours as needed.  - Call for new or changing lesions.  Skin cancer screening performed today.  History of Paronychia of L distal 2nd finger - Resolved with I&D, Doxycycline, Mupirocin  AK (actinic keratosis) (2) below L mid brow x 1, R temple x 1  Destruction of lesion - below L mid brow x 1, R temple x 1 Complexity: simple   Destruction method: cryotherapy   Informed consent: discussed and consent obtained   Timeout:  patient name, date of birth, surgical site, and procedure verified Lesion destroyed using liquid nitrogen: Yes   Region frozen until ice ball extended beyond lesion: Yes  Outcome: patient tolerated procedure well with no complications   Post-procedure details: wound care instructions given    Milia face  Start Arazlo lotion qhs to aa face, samples x 4 Lot #[id number] exp 10/2021  Discussed extraction if does not improve with topical treatment   Topical retinoid medications like tretinoin/Retin-A, adapalene/Differin, tazarotene/Fabior, and Epiduo/Epiduo Forte can cause  dryness and irritation when first started. Only apply a pea-sized amount to the entire affected area. Avoid applying it around the eyes, edges of mouth and creases at the nose. If you experience irritation, use a good moisturizer first and/or apply the medicine less often. If you are doing well with the medicine, you can increase how often you use it until you are applying every night. Be careful with sun protection while using this medication as it can make you sensitive to the sun. This medicine should not be used by pregnant women.    Neoplasm of skin Left low back lateral above the posterior waistline  Epidermal / dermal shaving  Lesion diameter (cm):  1.1 Informed consent: discussed and consent obtained   Timeout: patient name, date of birth, surgical site, and procedure verified   Procedure prep:  Patient was prepped and draped in usual sterile fashion Prep type:  Isopropyl alcohol Anesthesia: the lesion was anesthetized in a standard fashion   Anesthetic:  1% lidocaine w/ epinephrine 1-100,000 buffered w/ 8.4% NaHCO3 Instrument used: flexible razor blade   Hemostasis achieved with: pressure, aluminum chloride and electrodesiccation   Outcome: patient tolerated procedure well   Post-procedure details: sterile dressing applied and wound care instructions given   Dressing type: bandage and petrolatum    Specimen 1 - Surgical pathology Differential Diagnosis: D48.5 Nevus vs Dysplastic Nevus vs Halo Nevus Check Margins: yes Brown pap with surrounding hypopigmentation 1.1cm  Nevus vs Dysplastic Nevus vs Halo Nevus, shave removal/bx today  Inflamed seborrheic keratosis (7) L trunk x 7  Destruction of lesion - L trunk x 7 Complexity: simple   Destruction method: cryotherapy   Informed consent: discussed and consent obtained   Timeout:  patient name, date of birth, surgical site, and procedure verified Lesion destroyed using liquid nitrogen: Yes   Region frozen until ice ball  extended beyond lesion: Yes   Outcome: patient tolerated procedure well with no complications   Post-procedure details: wound care instructions given    Elastosis of skin face  Discussed Alastin Eye cream  Skin cancer screening  Return for as scheduled 07/07/20 for Botox, ISK f/u, AK f/u.   I, Othelia Pulling, RMA, am acting as scribe for Sarina Ser, MD .  Documentation: I have reviewed the above documentation for accuracy and completeness, and I agree with the above.  Sarina Ser, MD

## 2020-06-08 ENCOUNTER — Ambulatory Visit: Payer: Self-pay | Attending: Internal Medicine

## 2020-06-08 DIAGNOSIS — Z23 Encounter for immunization: Secondary | ICD-10-CM

## 2020-06-08 NOTE — Progress Notes (Signed)
   Covid-19 Vaccination Clinic  Name:  Jennifer Bernard    MRN: [id number] DOB: [birth year]  06/08/2020  Jennifer Bernard was observed post Covid-19 immunization for 15 minutes without incident. She was provided with Vaccine Information Sheet and instruction to access the V-Safe system.   Jennifer Bernard was instructed to call 911 with any severe reactions post vaccine: Marland Kitchen Difficulty breathing  . Swelling of face and throat  . A fast heartbeat  . A bad rash all over body  . Dizziness and weakness

## 2020-06-09 ENCOUNTER — Encounter: Payer: Self-pay | Admitting: Dermatology

## 2020-06-10 ENCOUNTER — Telehealth: Payer: Self-pay

## 2020-06-10 DIAGNOSIS — G43119 Migraine with aura, intractable, without status migrainosus: Secondary | ICD-10-CM | POA: Diagnosis not present

## 2020-06-10 DIAGNOSIS — M5481 Occipital neuralgia: Secondary | ICD-10-CM | POA: Diagnosis not present

## 2020-06-10 NOTE — Telephone Encounter (Signed)
-----   Message from Ralene Bathe, MD sent at 06/10/2020 10:29 AM EDT ----- Diagnosis Skin , left low back lateral above the posterior waistline SEBORRHEIC KERATOSIS, IRRITATED  Benign irritated keratosis No further treatment needed

## 2020-06-10 NOTE — Telephone Encounter (Signed)
Advised patient's son of results/hd

## 2020-06-18 ENCOUNTER — Ambulatory Visit: Payer: PRIVATE HEALTH INSURANCE | Admitting: Internal Medicine

## 2020-06-30 ENCOUNTER — Other Ambulatory Visit: Payer: Self-pay

## 2020-06-30 ENCOUNTER — Telehealth: Payer: Self-pay | Admitting: Internal Medicine

## 2020-06-30 MED ORDER — AMLODIPINE BESYLATE 5 MG PO TABS
5.0000 mg | ORAL_TABLET | Freq: Every day | ORAL | 1 refills | Status: DC
Start: 2020-06-30 — End: 2020-09-09

## 2020-06-30 NOTE — Telephone Encounter (Signed)
Did you inform her to start amlodipine 5mg  q day and if so, needs new rx sent in.  Also, did she need clarification on lexapro?  Have her follow blood pressures and send in readings.

## 2020-06-30 NOTE — Telephone Encounter (Signed)
Patient stated she lost her sister last week, she felt her BP elevated, she went to the nurses at [facility] [place] and she was told to let PCP know of 160/90 this morning.  She stated her bp meds may need to be elevated.    She needed clarification on lexapro rx, which was given.  Patient stated she has a stiff neck and head ache. NO SOB, chest px, arm px, blurry vision, and jaw px.   Flu shot was also given today as well. It has been added to chart.

## 2020-06-30 NOTE — Telephone Encounter (Signed)
Spoken to patient. Her headache and stiffneck is currently gone. She stated she has had this issue for years. Appointment has been scheduled.

## 2020-06-30 NOTE — Telephone Encounter (Signed)
There is mention of headache and stiff neck.  If acute headache, stiff neck and elevated blood pressure - needs to be seen.  She has a history of migraines.  If no acute change or increased headache, I am ok to increase amlodipine to 5mg  q day.  Confirm she is currently on 2.5mg  amlodipine q day.  (if so, increase to 5mg  q day).  Monitor blood pressure.  Let us know if problems and needs a f/u appt with me.  What does she need to know regarding lexapro - per note on 10mg  q day.

## 2020-06-30 NOTE — Telephone Encounter (Signed)
Can you triage her and make sure nothing acute is going on?

## 2020-06-30 NOTE — Telephone Encounter (Signed)
Yes she was informed to take the 5mg  of amlodipine. I have already clarified her dose of Lexapro earlier. She was informed to maintain a log and send the readings. New medication rx has been sent to pharmacy.

## 2020-06-30 NOTE — Telephone Encounter (Signed)
Pt called and said that her BP has been running around 160/90 for the last 2 weeks since her sister passed away d Pt is not having any SOB or chest pain but she said that she is not feeling like herself   Pt also has a question about escitalopram (LEXAPRO) 10 MG tablet and dosage

## 2020-07-02 NOTE — Telephone Encounter (Signed)
Pt called and needs clarification on the Amlodipine

## 2020-07-02 NOTE — Telephone Encounter (Signed)
Left message for patient to return call back.  

## 2020-07-07 ENCOUNTER — Other Ambulatory Visit: Payer: Self-pay

## 2020-07-07 ENCOUNTER — Ambulatory Visit (INDEPENDENT_AMBULATORY_CARE_PROVIDER_SITE_OTHER): Payer: Medicare HMO | Admitting: Dermatology

## 2020-07-07 DIAGNOSIS — L570 Actinic keratosis: Secondary | ICD-10-CM | POA: Diagnosis not present

## 2020-07-07 DIAGNOSIS — L578 Other skin changes due to chronic exposure to nonionizing radiation: Secondary | ICD-10-CM | POA: Diagnosis not present

## 2020-07-07 DIAGNOSIS — L820 Inflamed seborrheic keratosis: Secondary | ICD-10-CM

## 2020-07-07 DIAGNOSIS — L988 Other specified disorders of the skin and subcutaneous tissue: Secondary | ICD-10-CM

## 2020-07-07 NOTE — Progress Notes (Signed)
Follow-Up Visit   Subjective  Jennifer Bernard is a 86 y.o. female who presents for the following: Facial Elastosis (Botox today). She also has from rough areas to be checked.  Many are irritating.  The following portions of the chart were reviewed this encounter and updated as appropriate:  Tobacco  Allergies  Meds  Problems  Med Hx  Surg Hx  Fam Hx     Review of Systems:  No other skin or systemic complaints except as noted in HPI or Assessment and Plan.  Objective  Well appearing patient in no apparent distress; mood and affect are within normal limits.  A focused examination was performed including face, trunk. Relevant physical exam findings are noted in the Assessment and Plan.  Objective  Right Temple x 1, trunk x 2: Erythematous keratotic or waxy stuck-on papule or plaque.   Objective  Left mid brow: Erythematous thin papules/macules with gritty scale.   Objective  Face: Rhytides and volume loss.   Images     Assessment & Plan  Inflamed seborrheic keratosis Right Temple x 1, trunk x 2  Destruction of lesion - Right Temple x 1, trunk x 2 Complexity: simple   Destruction method: cryotherapy   Informed consent: discussed and consent obtained   Timeout:  patient name, date of birth, surgical site, and procedure verified Lesion destroyed using liquid nitrogen: Yes   Region frozen until ice ball extended beyond lesion: Yes   Outcome: patient tolerated procedure well with no complications   Post-procedure details: wound care instructions given    AK (actinic keratosis) Left mid brow  Destruction of lesion - Left mid brow Complexity: simple   Destruction method: cryotherapy   Informed consent: discussed and consent obtained   Timeout:  patient name, date of birth, surgical site, and procedure verified Lesion destroyed using liquid nitrogen: Yes   Region frozen until ice ball extended beyond lesion: Yes   Outcome: patient tolerated procedure well  with no complications   Post-procedure details: wound care instructions given    Elastosis of skin Face  Frown Complex - 25 units Brow Lift - 2.5 units each side Forehead - 7.5 units  Botox Injection - Face Location: See attached image  Informed consent: Discussed risks (infection, pain, bleeding, bruising, swelling, allergic reaction, paralysis of nearby muscles, eyelid droop, double vision, neck weakness, difficulty breathing, headache, undesirable cosmetic result, and need for additional treatment) and benefits of the procedure, as well as the alternatives.  Informed consent was obtained.  Preparation: The area was cleansed with alcohol.  Procedure Details:  Botox was injected into the dermis with a 30-gauge needle. Pressure applied to any bleeding. Ice packs offered for swelling.  Lot Number:  [id number] Expiration:  10/2022  Total Units Injected:  37.5  Plan: Patient was instructed to remain upright for 4 hours. Patient was instructed to avoid massaging the face and avoid vigorous exercise for the rest of the day. Tylenol may be used for headache.  Allow 2 weeks before returning to clinic for additional dosing as needed. Patient will call for any problems.  Actinic Damage - chronic, secondary to cumulative UV radiation exposure/sun exposure over time - diffuse scaly erythematous macules with underlying dyspigmentation - Recommend daily broad spectrum sunscreen SPF 30+ to sun-exposed areas, reapply every 2 hours as needed.  - Call for new or changing lesions.  Return for Botox in 3-4 months and TBSE in 6 months.   I, Ashok Cordia, CMA, am acting as Education administrator for [employer]  [name], MD .  Documentation: I have reviewed the above documentation for accuracy and completeness, and I agree with the above.  Sarina Ser, MD

## 2020-07-14 ENCOUNTER — Other Ambulatory Visit: Payer: Self-pay

## 2020-07-14 ENCOUNTER — Encounter: Payer: Self-pay | Admitting: Dermatology

## 2020-07-16 ENCOUNTER — Encounter: Payer: Self-pay | Admitting: Internal Medicine

## 2020-07-16 ENCOUNTER — Other Ambulatory Visit: Payer: Self-pay

## 2020-07-16 ENCOUNTER — Ambulatory Visit (INDEPENDENT_AMBULATORY_CARE_PROVIDER_SITE_OTHER): Payer: Medicare HMO | Admitting: Internal Medicine

## 2020-07-16 DIAGNOSIS — E785 Hyperlipidemia, unspecified: Secondary | ICD-10-CM

## 2020-07-16 DIAGNOSIS — F325 Major depressive disorder, single episode, in full remission: Secondary | ICD-10-CM | POA: Diagnosis not present

## 2020-07-16 DIAGNOSIS — K219 Gastro-esophageal reflux disease without esophagitis: Secondary | ICD-10-CM | POA: Diagnosis not present

## 2020-07-16 DIAGNOSIS — G4733 Obstructive sleep apnea (adult) (pediatric): Secondary | ICD-10-CM | POA: Diagnosis not present

## 2020-07-16 DIAGNOSIS — E039 Hypothyroidism, unspecified: Secondary | ICD-10-CM | POA: Diagnosis not present

## 2020-07-16 DIAGNOSIS — R351 Nocturia: Secondary | ICD-10-CM

## 2020-07-16 DIAGNOSIS — R739 Hyperglycemia, unspecified: Secondary | ICD-10-CM | POA: Diagnosis not present

## 2020-07-16 DIAGNOSIS — I10 Essential (primary) hypertension: Secondary | ICD-10-CM | POA: Diagnosis not present

## 2020-07-16 NOTE — Progress Notes (Signed)
Patient ID: Jennifer Bernard, female   DOB: May 06, 1927, 86 y.o.   MRN: [id number]   Subjective:    Patient ID: Jennifer Bernard, female    DOB: 11-10-1926, 86 y.o.   MRN: [id number]  HPI This visit occurred during the SARS-CoV-2 public health emergency.  Safety protocols were in place, including screening questions prior to the visit, additional usage of staff PPE, and extensive cleaning of exam room while observing appropriate contact time as indicated for disinfecting solutions.  Patient here for a scheduled follow up  She is accompanied by her daughter.  History obtained from both of them.  Has been having problems with some increased fatigue.  Also - nocturia.  May get up 1 possibly 2x/night.  Seeing Dr Bernardo Heater.  Had discussed taking Sanctura.  She had questions about the medication.  Discussed that nocturia could be related to sleep apnea.  She has been diagnosed previously.  Previously used cpap.  States was told later - did not need.  Discussed reevaluation.  Has occasional acid reflux.  Takes pepcid.  No nausea or vomiting.  No abdominal pain.  Bowels moving.  Sees neurology - diagnosed with occipital neuralgia.  No chest pain or increased sob.  No increased cough.    Past Medical History:  Diagnosis Date  . Asymptomatic menopausal state 02/20/1984   Overview:  Naturally occuring and asymptomatic Vaginal atrophy present on topical estrogen Overview:  Overview:  Naturally occuring and asymptomatic Vaginal atrophy present on topical estrogen  . Basal cell carcinoma of skin 02/19/2013   Overview:  Basal cell on her nasolabial fold 2014 S/p Mohs by Dr. Lacinda Axon Followed by local dermatologist regularly Overview:  Overview:  Followed by Dr. Lacinda Axon  . Cyst of ovary 02/19/2009   Overview:  Ovarian cyst on right stable 2004-2010 Cyst ? larger on CT 2012 Stable on follow up US Followed by Dr. Edwinna Areola Overview:  Overview:  Ovarian cyst on right stable 2004-2010 Cyst ? larger on CT 2012 Stable on follow  up [place] Followed by Dr. Edwinna Areola  . Depression, major, single episode, complete remission ([facility]) 07/18/2017  . Diffuse cystic mastopathy of breast 02/19/2013   Overview:  Mother with breast cancer later in life Two breast biopsies both with normal pathology Annual mammograms recommended, last 2016 Followed at the [facility]:  Relevant Hx: Course: Daily Update: Today's Plan: Overview:  Overview:  Mother with breast cancer later in life Two breast biopsies both with normal pathology Annual mammograms recommended, last 55  . Essential (primary) hypertension 02/19/2013   Overview:  Goal blood pressure < 135/85 On atenolol Overview:  Overview:  Goal blood pressure < 135/85 On atenolol  . Hyperlipidemia 02/19/2013   Overview:  Goal LDL < 130, last value 92 in 2017 On Pravachol Overview:  Overview:  Goal LDL < 130, last value 82 in 2012 On Pravachol  . Hypothyroidism 02/28/2012   Overview:  started on T4  TFT normal 2017 Followed by Dr. Almon Hercules Overview:  Overview:  started on T4  TFT normal 2014 Followed by Dr. Almon Hercules  . Impaired glucose tolerance 02/20/2003   Overview:  Overview:  Glucose goal < 100, last value 94 in 2014 HgbA1c goal < 6.0, last value 5.3 in 2014 Urine microalbumin negative in 2014 On diet and exercise  . Nontoxic multinodular goiter 03/06/2003   Overview:  Asymptomatic nodular thyroid Stable [place] 2004-2014 Followed by Dr. Almon Hercules Overview:  Overview:  Asymptomatic nodular thyroid Stable [place] 2004-2014  Followed by Dr. Almon Hercules  . OSA on CPAP 01/13/2015   Overview:  Sleep study sent to medical records:   Sleep efficiency 22%, latency 24 min, # awakenings 11, res desat N=10, index 17.8  Apnea 1 for 16 sec, hypopnea 16 for 18 sec, total sleep time only 90 min so index = 11 She did not sleep much and the index puts her in mild category Repeat study a few days later when she slept 187 min her AHI = 4.6 which is normal, average O2 sat 94% I don't think   .  Osteoarthritis 02/20/2003   Overview:  Mild bilateral hands Right CMC Right hip Left knee s/p meniscus tear All symptomatically stable Overview:  Overview:  Mild bilateral hands Right CMC Right hip Left knee s/p meniscus tear All symptomatically stable  . Osteoporosis 02/20/2003   Overview:  Bone density 2004 LS -3.32 and RH-1.62 Bone density 2008 LS -3.14 and RH -1.22 Bone density 2009 LS -3.17 and RH -1.21 Bone density 2010 LS -3.14 and RH -1.42  Bone density 2014 LS -2.7 (L3 -3.1) and RH -1.5 Bone density 2017 LS -2.3 and RH -1.4 (neck -1.7) Vitamin D level 38 in 2012 On Boniva 2004 to 2010, restarted 2015 Repeat bone density in 2020 and stop Boniva  Last Assessment & Pl  . Prediabetes 02/20/2003   Overview:  Glucose goal < 100, last value 91 in 2017 HgbA1c goal < 6.0, last value 5.6 in 2017 Urine microalbumin negative in 2017 On diet and exercise  . Recurrent UTI 10/19/2014  . Squamous cell carcinoma of skin 03/14/2019   Left mid brow. SCCis, hypertrophic. EDC   Past Surgical History:  Procedure Laterality Date  . ABDOMINAL HYSTERECTOMY  1975  . URETEROSCOPY    . URETEROSCOPY WITH HOLMIUM LASER LITHOTRIPSY     Family History  Problem Relation Age of Onset  . Breast cancer Mother   . Skin cancer Father    Social History   Socioeconomic History  . Marital status: Widowed    Spouse name: Not on file  . Number of children: Not on file  . Years of education: Not on file  . Highest education level: Not on file  Occupational History  . Not on file  Tobacco Use  . Smoking status: Never Smoker  . Smokeless tobacco: Never Used  Vaping Use  . Vaping Use: Never used  Substance and Sexual Activity  . Alcohol use: No  . Drug use: No  . Sexual activity: Not Currently    Birth control/protection: Post-menopausal  Other Topics Concern  . Not on file  Social History Narrative  . Not on file   Social Determinants of Health   Financial Resource Strain: Low Risk   . Difficulty of Paying  Living Expenses: Not hard at all  Food Insecurity: No Food Insecurity  . Worried About Charity fundraiser in the Last Year: Never true  . Ran Out of Food in the Last Year: Never true  Transportation Needs: No Transportation Needs  . Lack of Transportation (Medical): No  . Lack of Transportation (Non-Medical): No  Physical Activity: Sufficiently Active  . Days of Exercise per Week: 5 days  . Minutes of Exercise per Session: 30 min  Stress: No Stress Concern Present  . Feeling of Stress : Not at all  Social Connections: Unknown  . Frequency of Communication with Friends and Family: More than three times a week  . Frequency of Social Gatherings with Friends and Family: More  than three times a week  . Attends Religious Services: Not on file  . Active Member of Clubs or Organizations: Not on file  . Attends Archivist Meetings: Not on file  . Marital Status: Widowed    Outpatient Encounter Medications as of 07/16/2020  Medication Sig  . amLODipine (NORVASC) 5 MG tablet Take 1 tablet (5 mg total) by mouth daily.  Marland Kitchen escitalopram (LEXAPRO) 10 MG tablet Take 1 tablet (10 mg total) by mouth daily.  Marland Kitchen levothyroxine (SYNTHROID, LEVOTHROID) 75 MCG tablet   . Multiple Vitamin (MULTIVITAMIN) capsule Take by mouth.  . pravastatin (PRAVACHOL) 40 MG tablet Take 1 tablet (40 mg total) by mouth every evening.  . famotidine (PEPCID) 20 MG tablet Take 1 tablet (20 mg total) by mouth daily.  . trospium (SANCTURA) 20 MG tablet 1 tablet 1 hour prior to bedtime (Patient not taking: Reported on 07/16/2020)  . [DISCONTINUED] doxycycline (MONODOX) 100 MG capsule Take 1 capsule (100 mg total) by mouth 2 (two) times daily. Take with food (Patient not taking: Reported on 07/16/2020)   No facility-administered encounter medications on file as of 07/16/2020.    Review of Systems  Constitutional: Positive for fatigue. Negative for appetite change and unexpected weight change.  HENT: Negative for congestion  and sinus pressure.   Respiratory: Negative for cough, chest tightness and shortness of breath.   Cardiovascular: Negative for chest pain, palpitations and leg swelling.  Gastrointestinal: Negative for abdominal pain, diarrhea, nausea and vomiting.  Genitourinary: Negative for difficulty urinating and dysuria.       Nocturia.   Musculoskeletal: Negative for joint swelling and myalgias.  Skin: Negative for color change and rash.  Neurological: Negative for dizziness, light-headedness and headaches.  Psychiatric/Behavioral: Negative for agitation and dysphoric mood.       Objective:    Physical Exam Vitals reviewed.  Constitutional:      General: She is not in acute distress.    Appearance: Normal appearance.  HENT:     Head: Normocephalic and atraumatic.     Right Ear: External ear normal.     Left Ear: External ear normal.  Eyes:     General: No scleral icterus.       Right eye: No discharge.        Left eye: No discharge.     Conjunctiva/sclera: Conjunctivae normal.  Neck:     Thyroid: No thyromegaly.  Cardiovascular:     Rate and Rhythm: Normal rate and regular rhythm.  Pulmonary:     Effort: No respiratory distress.     Breath sounds: Normal breath sounds. No wheezing.  Abdominal:     General: Bowel sounds are normal.     Palpations: Abdomen is soft.     Tenderness: There is no abdominal tenderness.  Musculoskeletal:        General: No swelling or tenderness.     Cervical back: Neck supple. No tenderness.  Lymphadenopathy:     Cervical: No cervical adenopathy.  Skin:    Findings: No erythema or rash.  Neurological:     Mental Status: She is alert.  Psychiatric:        Mood and Affect: Mood normal.        Behavior: Behavior normal.     BP 128/76   Pulse 82   Temp 98.6 F (37 C)   Ht '5\' 3"'  (1.6 m)   Wt 142 lb 3.2 oz (64.5 kg)   SpO2 98%   BMI 25.19 kg/m  Wt Readings  from Last 3 Encounters:  07/16/20 142 lb 3.2 oz (64.5 kg)  06/03/20 140 lb (63.5 kg)    04/10/20 144 lb (65.3 kg)     Lab Results  Component Value Date   WBC 7.3 10/10/2019   HGB 15.2 (H) 10/10/2019   HCT 45.8 10/10/2019   PLT 232.0 10/10/2019   GLUCOSE 97 01/28/2020   CHOL 189 01/28/2020   TRIG 149.0 01/28/2020   HDL 68.30 01/28/2020   LDLCALC 90 01/28/2020   ALT 14 01/28/2020   AST 21 01/28/2020   NA 140 01/28/2020   K 4.1 01/28/2020   CL 106 01/28/2020   CREATININE 0.74 01/28/2020   BUN 13 01/28/2020   CO2 27 01/28/2020   TSH 1.83 10/10/2019   HGBA1C 6.3 01/28/2020    DG Chest 2 View  Result Date: 04/10/2020 CLINICAL DATA:  Persistent cough and congestion EXAM: CHEST - 2 VIEW COMPARISON:  10/02/2019 FINDINGS: No focal opacity or pleural effusion. Mild atelectasis or scarring at the left lung base. Stable cardiomediastinal silhouette. No pneumothorax. IMPRESSION: No active cardiopulmonary disease. Electronically Signed   By: Donavan Foil M.D.   On: 04/10/2020 22:02       Assessment & Plan:   Problem List Items Addressed This Visit    OSA (obstructive sleep apnea)    Previously diagnosed with sleep apnea.  Previously told did not need cpap.  With increased fatigue and nocturia, will refer to pulmonary for reevaluation.  Pt in agreement.       Relevant Orders   Ambulatory referral to Pulmonology   CBC with Differential/Platelet   Nocturia    Sees Dr Bernardo Heater.  Has previously been on sanctura.  Recently evaluated by Dr Bernardo Heater. Was informed could restart.  Had questions today.  Gets up mostly 1x/night.  Will hold on restarting.  Assess for sleep apnea.        Relevant Orders   Ambulatory referral to Pulmonology   Hypothyroidism    On thyroid replacement.  Follow tsh.       Relevant Orders   TSH   Hyperlipidemia    On pravastatin.  Follow lipid panel and liver function tests.        Relevant Orders   Hepatic function panel   Lipid panel   Hyperglycemia    Follow met b and a1c.       Relevant Orders   Hemoglobin A1c   Essential  (primary) hypertension    Blood pressure doing well on amlodipine.  Follow pressures.  Follow metabolic panel.       Depression, major, single episode, complete remission ([facility])    Stable.  Continue lexapro.       Acid reflux    Occasional acid reflux noted previously.  Continue pepcid. Take daily.       Relevant Medications   famotidine (PEPCID) 20 MG tablet       Einar Pheasant, MD

## 2020-07-19 ENCOUNTER — Encounter: Payer: Self-pay | Admitting: Internal Medicine

## 2020-07-19 DIAGNOSIS — R739 Hyperglycemia, unspecified: Secondary | ICD-10-CM | POA: Insufficient documentation

## 2020-07-19 MED ORDER — FAMOTIDINE 20 MG PO TABS: 20.0000 mg | ORAL_TABLET | Freq: Every day | ORAL | 2 refills | Status: DC

## 2020-07-19 NOTE — Assessment & Plan Note (Signed)
Sees Dr Bernardo Heater.  Has previously been on sanctura.  Recently evaluated by Dr Bernardo Heater. Was informed could restart.  Had questions today.  Gets up mostly 1x/night.  Will hold on restarting.  Assess for sleep apnea.

## 2020-07-19 NOTE — Assessment & Plan Note (Signed)
Stable.  Continue lexapro.

## 2020-07-19 NOTE — Assessment & Plan Note (Addendum)
Occasional acid reflux noted previously.  Continue pepcid. Take daily.

## 2020-07-19 NOTE — Assessment & Plan Note (Signed)
On pravastatin.  Follow lipid panel and liver function tests.   

## 2020-07-19 NOTE — Assessment & Plan Note (Signed)
Follow met b and a1c.  

## 2020-07-19 NOTE — Assessment & Plan Note (Signed)
Blood pressure doing well on amlodipine.  Follow pressures.  Follow metabolic panel.   

## 2020-07-19 NOTE — Assessment & Plan Note (Signed)
On thyroid replacement.  Follow tsh.  

## 2020-07-19 NOTE — Assessment & Plan Note (Signed)
Previously diagnosed with sleep apnea.  Previously told did not need cpap.  With increased fatigue and nocturia, will refer to pulmonary for reevaluation.  Pt in agreement.

## 2020-07-20 ENCOUNTER — Other Ambulatory Visit: Payer: Self-pay

## 2020-07-20 ENCOUNTER — Other Ambulatory Visit (INDEPENDENT_AMBULATORY_CARE_PROVIDER_SITE_OTHER): Payer: PRIVATE HEALTH INSURANCE

## 2020-07-20 ENCOUNTER — Other Ambulatory Visit: Payer: Self-pay | Admitting: Internal Medicine

## 2020-07-20 DIAGNOSIS — E785 Hyperlipidemia, unspecified: Secondary | ICD-10-CM | POA: Diagnosis not present

## 2020-07-20 DIAGNOSIS — E039 Hypothyroidism, unspecified: Secondary | ICD-10-CM

## 2020-07-20 DIAGNOSIS — G4733 Obstructive sleep apnea (adult) (pediatric): Secondary | ICD-10-CM

## 2020-07-20 DIAGNOSIS — R739 Hyperglycemia, unspecified: Secondary | ICD-10-CM

## 2020-07-20 LAB — LIPID PANEL
Cholesterol: 166 mg/dL (ref 0–200)
HDL: 68.1 mg/dL (ref >39.00)
LDL Cholesterol: 76 mg/dL (ref 0–99)
NonHDL: 98.2
Total CHOL/HDL Ratio: 2
Triglycerides: 111 mg/dL (ref 0.0–149.0)
VLDL: 22.2 mg/dL (ref 0.0–40.0)

## 2020-07-20 LAB — CBC WITH DIFFERENTIAL/PLATELET
Basophils Absolute: 0.1 10*3/uL (ref 0.0–0.1)
Basophils Relative: 1.3 % (ref 0.0–3.0)
Eosinophils Absolute: 0.1 10*3/uL (ref 0.0–0.7)
Eosinophils Relative: 1.4 % (ref 0.0–5.0)
HCT: 45.4 % (ref 36.0–46.0)
Hemoglobin: 15 g/dL (ref 12.0–15.0)
Lymphocytes Relative: 19.2 % (ref 12.0–46.0)
Lymphs Abs: 1.4 10*3/uL (ref 0.7–4.0)
MCHC: 33.1 g/dL (ref 30.0–36.0)
MCV: 90.7 fl (ref 78.0–100.0)
Monocytes Absolute: 0.7 10*3/uL (ref 0.1–1.0)
Monocytes Relative: 9.3 % (ref 3.0–12.0)
Neutro Abs: 5.1 10*3/uL (ref 1.4–7.7)
Neutrophils Relative %: 68.8 % (ref 43.0–77.0)
Platelets: 248 10*3/uL (ref 150.0–400.0)
RBC: 5.01 Mil/uL (ref 3.87–5.11)
RDW: 14.4 % (ref 11.5–15.5)
WBC: 7.4 10*3/uL (ref 4.0–10.5)

## 2020-07-20 LAB — HEMOGLOBIN A1C: Hgb A1c MFr Bld: 5.6 % (ref 4.6–6.5)

## 2020-07-20 LAB — HEPATIC FUNCTION PANEL
ALT: 13 U/L (ref 0–35)
AST: 20 U/L (ref 0–37)
Albumin: 3.9 g/dL (ref 3.5–5.2)
Alkaline Phosphatase: 47 U/L (ref 39–117)
Bilirubin, Direct: 0.1 mg/dL (ref 0.0–0.3)
Total Bilirubin: 0.8 mg/dL (ref 0.2–1.2)
Total Protein: 6.6 g/dL (ref 6.0–8.3)

## 2020-07-20 LAB — TSH: TSH: 1.13 u[IU]/mL (ref 0.35–4.50)

## 2020-07-21 ENCOUNTER — Other Ambulatory Visit (INDEPENDENT_AMBULATORY_CARE_PROVIDER_SITE_OTHER): Payer: Medicare HMO

## 2020-07-21 ENCOUNTER — Other Ambulatory Visit: Payer: Self-pay | Admitting: Internal Medicine

## 2020-07-21 DIAGNOSIS — E785 Hyperlipidemia, unspecified: Secondary | ICD-10-CM | POA: Diagnosis not present

## 2020-07-21 DIAGNOSIS — R739 Hyperglycemia, unspecified: Secondary | ICD-10-CM

## 2020-07-21 LAB — BASIC METABOLIC PANEL
BUN: 14 mg/dL (ref 6–23)
CO2: 25 mEq/L (ref 19–32)
Calcium: 9.1 mg/dL (ref 8.4–10.5)
Chloride: 105 mEq/L (ref 96–112)
Creatinine, Ser: 0.8 mg/dL (ref 0.40–1.20)
GFR: 64.46 mL/min (ref >60.00)
Glucose, Bld: 79 mg/dL (ref 70–99)
Potassium: 4.1 mEq/L (ref 3.5–5.1)
Sodium: 142 mEq/L (ref 135–145)

## 2020-07-21 NOTE — Progress Notes (Signed)
Order placed for add on met b 

## 2020-08-06 ENCOUNTER — Telehealth: Payer: Self-pay | Admitting: Internal Medicine

## 2020-08-06 NOTE — Telephone Encounter (Signed)
Spoke with daughter in law to let her know that I just received this message. Pt took a home covid test that was negative and is having no symptoms.

## 2020-08-06 NOTE — Telephone Encounter (Signed)
Patient's daugther n law called in wanted to know if patient could get covid at 10 this morning  when she comes because she was exposed to covid as well lshe is a patient of Dr.Scott as  Her name is Jennifer Bernard

## 2020-08-24 DIAGNOSIS — H5021 Vertical strabismus, right eye: Secondary | ICD-10-CM | POA: Diagnosis not present

## 2020-08-24 DIAGNOSIS — H532 Diplopia: Secondary | ICD-10-CM | POA: Diagnosis not present

## 2020-08-24 DIAGNOSIS — H5000 Unspecified esotropia: Secondary | ICD-10-CM | POA: Diagnosis not present

## 2020-08-30 ENCOUNTER — Telehealth: Payer: Self-pay | Admitting: Internal Medicine

## 2020-09-03 ENCOUNTER — Ambulatory Visit: Payer: PRIVATE HEALTH INSURANCE | Admitting: Internal Medicine

## 2020-09-03 DIAGNOSIS — H5000 Unspecified esotropia: Secondary | ICD-10-CM | POA: Diagnosis not present

## 2020-09-03 DIAGNOSIS — Z9842 Cataract extraction status, left eye: Secondary | ICD-10-CM | POA: Diagnosis not present

## 2020-09-03 DIAGNOSIS — H532 Diplopia: Secondary | ICD-10-CM | POA: Diagnosis not present

## 2020-09-03 DIAGNOSIS — Z9841 Cataract extraction status, right eye: Secondary | ICD-10-CM | POA: Diagnosis not present

## 2020-09-09 MED ORDER — AMLODIPINE BESYLATE 5 MG PO TABS
5.0000 mg | ORAL_TABLET | Freq: Every day | ORAL | 1 refills | Status: DC
Start: 2020-09-09 — End: 2020-10-20

## 2020-09-09 NOTE — Telephone Encounter (Signed)
In reviewing the chart, per your phone note on 06/30/20, she was changed to 5mg  amlodipine q day.  New rx sent in.  It appears old rx not taken off.  Per note, should be on 5mg  q day.  Can confirm with pt as well that this is what she has been taking.

## 2020-09-09 NOTE — Telephone Encounter (Signed)
[facility] called and states that they need to know if pt is on 2.5 or 5mg . Please call back to clarify with the pharmacist and d/c the other [phone]

## 2020-09-09 NOTE — Addendum Note (Signed)
Addended byElpidio Galea T on: 09/09/2020 02:57 PM   Modules accepted: Orders

## 2020-09-22 ENCOUNTER — Telehealth: Payer: Self-pay | Admitting: Primary Care

## 2020-09-22 NOTE — Telephone Encounter (Signed)
Patient is scheduled for sleep consult on 09/22/2020. I contacted patient and asked if she has had a prior sleep study. Patient stated that she last had a sleep study at [facility] about 3 years ago.  Patient does not currently wear cpap.  I have left a message for [place] with [facility].

## 2020-09-23 ENCOUNTER — Other Ambulatory Visit: Payer: Self-pay

## 2020-09-23 ENCOUNTER — Encounter: Payer: Self-pay | Admitting: Primary Care

## 2020-09-23 ENCOUNTER — Ambulatory Visit (INDEPENDENT_AMBULATORY_CARE_PROVIDER_SITE_OTHER): Payer: Medicare HMO | Admitting: Primary Care

## 2020-09-23 VITALS — BP 128/80 | HR 89 | Temp 97.3°F | Ht 63.0 in | Wt 144.2 lb

## 2020-09-23 DIAGNOSIS — G4719 Other hypersomnia: Secondary | ICD-10-CM

## 2020-09-23 DIAGNOSIS — G4733 Obstructive sleep apnea (adult) (pediatric): Secondary | ICD-10-CM | POA: Diagnosis not present

## 2020-09-23 NOTE — Assessment & Plan Note (Addendum)
-   Prior history of sleep apnea, she was on CPAP in the past  - Patient reports symptoms of daytime fatigue and nocturia. Epworth score 8.  - I have a moderate suspicion patient may have underlying sleep apnea, ordered for HST to evaluate  - Discussed with patient that untreated sleep apnea puts patient at a higher risk for cardiovascular disease, cardiac arrhythmias, pulmonary hypertension, diabetes, stroke and increase in daytime accidents. We reviewed treatment options including weight loss, side sleeping position, oral appliance, CPAP or referral to ENT for possible surgical interventions - Advised patient not to drive if experiencing excessive daytime fatigue or somnolence - FU in 4 weeks after sleep study to review result and treatment options

## 2020-09-23 NOTE — Progress Notes (Signed)
@Patient  ID: Jennifer Bernard, female    DOB: April 08, 1927, 87 y.o.   MRN: [id number]  Chief Complaint  Patient presents with  . sleep consult    Per Dr. Michaell Cowing sleep study around 2019. C/o daytime sleepiness.    Referring provider: Einar Pheasant, MD  HPI: 87 year old female, never smoked.  Past medical history significant for, OSA, acid reflux, hypothyroidism, basal cell carcinoma of skin, osteoporosis, recurrent UTI, anemia, diabetes.  Patient of Dr. Patsey Berthold, seen in office on 02/25/2020.  09/23/2020 Presents today for sleep consult. Referred by PCP for fatigue and nocturia. Patient had prior sleep study around 2018, results are not available. She was on CPAP for a period of time, stopped using 3 years ago after a physician at [facility] told her she did not need to use CPAP anymore. She has been waking up more frequently at night. Bedtime is 9-10pm. She wakes up 2-3 times to use restroom which interferes with her quaility of her sleep. She gets tired late afternoon, takes a nap for 45-60 mins. She does not currently have any respiratory complaints or shortness of breath. Denies sleep talking/walking, narcolepsy, cataplexy.   Sleep Questionnaire Symptoms: Restless, daytime fatigue  Previous sleep study: 2018, results not available  Typical bedtime: 9-10pm  Length of time to fall asleep: 30 mins Nocturnal awakenings: 2-3 times Out of bed in the morning: 7am  Epworth Score: 8  Allergies  Allergen Reactions  . Penicillins Hives    Other reaction(s): Unknown  . Ceftin [Cefuroxime Axetil] Diarrhea    GI distress  . Ramipril     Other reaction(s): Cough Other reaction(s): Cough     Immunization History  Administered Date(s) Administered  . Fluad Quad(high Dose 65+) 05/20/2019  . Influenza-Unspecified 05/28/2015, 05/15/2016, 05/09/2017, 06/30/2020  . PFIZER(Purple Top)SARS-COV-2 Vaccination 08/21/2019, 09/11/2019, 06/08/2020  . Pneumococcal Conjugate-13 04/16/2014  .  Pneumococcal Polysaccharide-23 08/15/2009  . Tdap 08/15/2010  . Zoster 02/27/2010  . Zoster Recombinat (Shingrix) 02/28/2019    Past Medical History:  Diagnosis Date  . Asymptomatic menopausal state 02/20/1984   Overview:  Naturally occuring and asymptomatic Vaginal atrophy present on topical estrogen Overview:  Overview:  Naturally occuring and asymptomatic Vaginal atrophy present on topical estrogen  . Basal cell carcinoma of skin 02/19/2013   Overview:  Basal cell on her nasolabial fold 2014 S/p Mohs by Dr. Lacinda Axon Followed by local dermatologist regularly Overview:  Overview:  Followed by Dr. Lacinda Axon  . Cyst of ovary 02/19/2009   Overview:  Ovarian cyst on right stable 2004-2010 Cyst ? larger on CT 2012 Stable on follow up US Followed by Dr. Edwinna Areola Overview:  Overview:  Ovarian cyst on right stable 2004-2010 Cyst ? larger on CT 2012 Stable on follow up US Followed by Dr. Edwinna Areola  . Depression, major, single episode, complete remission ([place]) 07/18/2017  . Diffuse cystic mastopathy of breast 02/19/2013   Overview:  Mother with breast cancer later in life Two breast biopsies both with normal pathology Annual mammograms recommended, last 2016 Followed at the [place] [facility]:  Relevant Hx: Course: Daily Update: Today's Plan: Overview:  Overview:  Mother with breast cancer later in life Two breast biopsies both with normal pathology Annual mammograms recommended, last 85  . Essential (primary) hypertension 02/19/2013   Overview:  Goal blood pressure < 135/85 On atenolol Overview:  Overview:  Goal blood pressure < 135/85 On atenolol  . Hyperlipidemia 02/19/2013   Overview:  Goal LDL < 130, last value 92  in 2017 On Pravachol Overview:  Overview:  Goal LDL < 130, last value 82 in 2012 On Pravachol  . Hypothyroidism 02/28/2012   Overview:  started on T4  TFT normal 2017 Followed by Dr. Almon Hercules Overview:  Overview:  started on T4  TFT normal 2014 Followed by Dr. Almon Hercules  .  Impaired glucose tolerance 02/20/2003   Overview:  Overview:  Glucose goal < 100, last value 94 in 2014 HgbA1c goal < 6.0, last value 5.3 in 2014 Urine microalbumin negative in 2014 On diet and exercise  . Nontoxic multinodular goiter 03/06/2003   Overview:  Asymptomatic nodular thyroid Stable [place] 2004-2014 Followed by Dr. Almon Hercules Overview:  Overview:  Asymptomatic nodular thyroid Stable [place] 2004-2014 Followed by Dr. Almon Hercules  . OSA on CPAP 01/13/2015   Overview:  Sleep study sent to medical records:   Sleep efficiency 22%, latency 24 min, # awakenings 11, res desat N=10, index 17.8  Apnea 1 for 16 sec, hypopnea 16 for 18 sec, total sleep time only 90 min so index = 11 She did not sleep much and the index puts her in mild category Repeat study a few days later when she slept 187 min her AHI = 4.6 which is normal, average O2 sat 94% I don't think   . Osteoarthritis 02/20/2003   Overview:  Mild bilateral hands Right CMC Right hip Left knee s/p meniscus tear All symptomatically stable Overview:  Overview:  Mild bilateral hands Right CMC Right hip Left knee s/p meniscus tear All symptomatically stable  . Osteoporosis 02/20/2003   Overview:  Bone density 2004 LS -3.32 and RH-1.62 Bone density 2008 LS -3.14 and RH -1.22 Bone density 2009 LS -3.17 and RH -1.21 Bone density 2010 LS -3.14 and RH -1.42  Bone density 2014 LS -2.7 (L3 -3.1) and RH -1.5 Bone density 2017 LS -2.3 and RH -1.4 (neck -1.7) Vitamin D level 38 in 2012 On Boniva 2004 to 2010, restarted 2015 Repeat bone density in 2020 and stop Boniva  Last Assessment & Pl  . Prediabetes 02/20/2003   Overview:  Glucose goal < 100, last value 91 in 2017 HgbA1c goal < 6.0, last value 5.6 in 2017 Urine microalbumin negative in 2017 On diet and exercise  . Recurrent UTI 10/19/2014  . Squamous cell carcinoma of skin 03/14/2019   Left mid brow. SCCis, hypertrophic. EDC    Tobacco History: Social History   Tobacco Use  Smoking Status Never Smoker  Smokeless Tobacco  Never Used   Counseling given: Not Answered   Outpatient Medications Prior to Visit  Medication Sig Dispense Refill  . amLODipine (NORVASC) 5 MG tablet Take 1 tablet (5 mg total) by mouth daily. 90 tablet 1  . escitalopram (LEXAPRO) 10 MG tablet TAKE 1 TABLET EVERY DAY 90 tablet 1  . famotidine (PEPCID) 20 MG tablet Take 1 tablet (20 mg total) by mouth daily. 30 tablet 2  . levothyroxine (SYNTHROID, LEVOTHROID) 75 MCG tablet     . Multiple Vitamin (MULTIVITAMIN) capsule Take by mouth.    . pravastatin (PRAVACHOL) 40 MG tablet TAKE 1 TABLET (40 MG TOTAL) BY MOUTH EVERY EVENING. 90 tablet 1  . trospium (SANCTURA) 20 MG tablet 1 tablet 1 hour prior to bedtime 30 tablet 0   No facility-administered medications prior to visit.    Review of Systems  Review of Systems  Constitutional: Positive for fatigue.  Respiratory: Negative.  Negative for shortness of breath.   Psychiatric/Behavioral: Positive for sleep disturbance.   Physical Exam  BP  128/80   Pulse 89   Temp (!) 97.3 F (36.3 C) (Temporal)   Ht 5\' 3"  (1.6 m)   Wt 144 lb 3.2 oz (65.4 kg)   SpO2 98%   BMI 25.54 kg/m  Physical Exam Constitutional:      Appearance: Normal appearance.  HENT:     Head: Normocephalic and atraumatic.     Mouth/Throat:     Mouth: Mucous membranes are moist.     Pharynx: Oropharynx is clear.  Cardiovascular:     Rate and Rhythm: Normal rate and regular rhythm.  Pulmonary:     Comments: CTA Musculoskeletal:     Comments: In transport WC  Neurological:     General: No focal deficit present.     Mental Status: She is alert and oriented to person, place, and time. Mental status is at baseline.  Psychiatric:        Mood and Affect: Mood normal.        Behavior: Behavior normal.        Thought Content: Thought content normal.        Judgment: Judgment normal.      Lab Results:  CBC    Component Value Date/Time   WBC 7.4 07/20/2020 0823   RBC 5.01 07/20/2020 0823   HGB 15.0  07/20/2020 0823   HCT 45.4 07/20/2020 0823   PLT 248.0 07/20/2020 0823   MCV 90.7 07/20/2020 0823   MCHC 33.1 07/20/2020 0823   RDW 14.4 07/20/2020 0823   LYMPHSABS 1.4 07/20/2020 0823   MONOABS 0.7 07/20/2020 0823   EOSABS 0.1 07/20/2020 0823   BASOSABS 0.1 07/20/2020 0823    BMET    Component Value Date/Time   NA 142 07/21/2020 0854   K 4.1 07/21/2020 0854   CL 105 07/21/2020 0854   CO2 25 07/21/2020 0854   GLUCOSE 79 07/21/2020 0854   BUN 14 07/21/2020 0854   CREATININE 0.80 07/21/2020 0854   CALCIUM 9.1 07/21/2020 0854   GFRNONAA >60 10/02/2019 0929   GFRAA >60 10/02/2019 0929    BNP No results found for: BNP  ProBNP No results found for: PROBNP  Imaging: No results found.   Assessment & Plan:   OSA (obstructive sleep apnea) - Prior history of sleep apnea, she was on CPAP in the past  - Patient reports symptoms of daytime fatigue and nocturia. Epworth score 8.  - I have a moderate suspicion patient may have underlying sleep apnea, ordered for HST to evaluate  - Discussed with patient that untreated sleep apnea puts patient at a higher risk for cardiovascular disease, cardiac arrhythmias, pulmonary hypertension, diabetes, stroke and increase in daytime accidents. We reviewed treatment options including weight loss, side sleeping position, oral appliance, CPAP or referral to ENT for possible surgical interventions - Advised patient not to drive if experiencing excessive daytime fatigue or somnolence - FU in 4 weeks after sleep study to review result and treatment options   Martyn Ehrich, NP 09/23/2020

## 2020-09-23 NOTE — Telephone Encounter (Signed)
Spoke to [employer] with [facility], who stated that she is not able to locate sleep study in the new system. She is going to contact [facility] and see if sleep study can be located in the older system.

## 2020-09-23 NOTE — Patient Instructions (Signed)
Orders: Home sleep study RE: hx sleep apnea  Follow-up: 4 weeks with Beth or APP

## 2020-09-24 NOTE — Telephone Encounter (Signed)
Home sleep test was ordered at 09/23/2020 visit.  Will close encounter as sleep study is not needed.

## 2020-09-30 NOTE — Progress Notes (Signed)
Reviewed and agree with assessment/plan.   Chesley Mires, MD [facility] Pulmonary/Critical Care 09/30/2020, 10:59 AM Pager:  [phone]

## 2020-10-15 ENCOUNTER — Other Ambulatory Visit: Payer: Self-pay

## 2020-10-15 ENCOUNTER — Encounter: Payer: Self-pay | Admitting: Internal Medicine

## 2020-10-15 ENCOUNTER — Ambulatory Visit (INDEPENDENT_AMBULATORY_CARE_PROVIDER_SITE_OTHER): Payer: Medicare HMO | Admitting: Internal Medicine

## 2020-10-15 DIAGNOSIS — E785 Hyperlipidemia, unspecified: Secondary | ICD-10-CM

## 2020-10-15 DIAGNOSIS — I10 Essential (primary) hypertension: Secondary | ICD-10-CM | POA: Diagnosis not present

## 2020-10-15 DIAGNOSIS — K219 Gastro-esophageal reflux disease without esophagitis: Secondary | ICD-10-CM | POA: Diagnosis not present

## 2020-10-15 DIAGNOSIS — G4733 Obstructive sleep apnea (adult) (pediatric): Secondary | ICD-10-CM | POA: Diagnosis not present

## 2020-10-15 DIAGNOSIS — F325 Major depressive disorder, single episode, in full remission: Secondary | ICD-10-CM | POA: Diagnosis not present

## 2020-10-15 DIAGNOSIS — R739 Hyperglycemia, unspecified: Secondary | ICD-10-CM | POA: Diagnosis not present

## 2020-10-15 DIAGNOSIS — E039 Hypothyroidism, unspecified: Secondary | ICD-10-CM | POA: Diagnosis not present

## 2020-10-15 DIAGNOSIS — L539 Erythematous condition, unspecified: Secondary | ICD-10-CM | POA: Diagnosis not present

## 2020-10-15 DIAGNOSIS — R351 Nocturia: Secondary | ICD-10-CM | POA: Diagnosis not present

## 2020-10-15 NOTE — Progress Notes (Signed)
Patient ID: MYRELLA FAHS, female   DOB: 12-Dec-1926, 87 y.o.   MRN: [id number]   Subjective:    Patient ID: [name], female    DOB: 08-Nov-1926, 87 y.o.   MRN: [id number]  HPI This visit occurred during the SARS-CoV-2 public health emergency.  Safety protocols were in place, including screening questions prior to the visit, additional usage of staff PPE, and extensive cleaning of exam room while observing appropriate contact time as indicated for disinfecting solutions.  Patient here for a scheduled follow up. Here to follow up regarding increased fatigue, elevated blood pressure and f/u cholesterol.  She was recently evaluated by pulmonary for fatigue and nocturia.  Is scheduled to have a home sleep test.  She tries to stay active.  No chest pain or sob with increased activity or exertion.  No abdominal pain or bowel change reported.  Left first finger - minimal redness.     Past Medical History:  Diagnosis Date  . Asymptomatic menopausal state 02/20/1984   Overview:  Naturally occuring and asymptomatic Vaginal atrophy present on topical estrogen Overview:  Overview:  Naturally occuring and asymptomatic Vaginal atrophy present on topical estrogen  . Basal cell carcinoma of skin 02/19/2013   Overview:  Basal cell on her nasolabial fold 2014 S/p Mohs by Dr. Lacinda Axon Followed by local dermatologist regularly Overview:  Overview:  Followed by Dr. Lacinda Axon  . Cyst of ovary 02/19/2009   Overview:  Ovarian cyst on right stable 2004-2010 Cyst ? larger on CT 2012 Stable on follow up US Followed by Dr. Edwinna Areola Overview:  Overview:  Ovarian cyst on right stable 2004-2010 Cyst ? larger on CT 2012 Stable on follow up US Followed by Dr. Edwinna Areola  . Depression, major, single episode, complete remission ([place]) 07/18/2017  . Diffuse cystic mastopathy of breast 02/19/2013   Overview:  Mother with breast cancer later in life Two breast biopsies both with normal pathology Annual mammograms recommended,  last 2016 Followed at the [place]:  Relevant Hx: Course: Daily Update: Today's Plan: Overview:  Overview:  Mother with breast cancer later in life Two breast biopsies both with normal pathology Annual mammograms recommended, last 60  . Essential (primary) hypertension 02/19/2013   Overview:  Goal blood pressure < 135/85 On atenolol Overview:  Overview:  Goal blood pressure < 135/85 On atenolol  . Hyperlipidemia 02/19/2013   Overview:  Goal LDL < 130, last value 92 in 2017 On Pravachol Overview:  Overview:  Goal LDL < 130, last value 82 in 2012 On Pravachol  . Hypothyroidism 02/28/2012   Overview:  started on T4  TFT normal 2017 Followed by Dr. Almon Hercules Overview:  Overview:  started on T4  TFT normal 2014 Followed by Dr. Almon Hercules  . Impaired glucose tolerance 02/20/2003   Overview:  Overview:  Glucose goal < 100, last value 94 in 2014 HgbA1c goal < 6.0, last value 5.3 in 2014 Urine microalbumin negative in 2014 On diet and exercise  . Nontoxic multinodular goiter 03/06/2003   Overview:  Asymptomatic nodular thyroid Stable [place] 2004-2014 Followed by Dr. Almon Hercules Overview:  Overview:  Asymptomatic nodular thyroid Stable [place] 2004-2014 Followed by Dr. Almon Hercules  . OSA on CPAP 01/13/2015   Overview:  Sleep study sent to medical records:   Sleep efficiency 22%, latency 24 min, # awakenings 11, res desat N=10, index 17.8  Apnea 1 for 16 sec, hypopnea 16 for 18 sec, total sleep time only 90 min so index =  11 She did not sleep much and the index puts her in mild category Repeat study a few days later when she slept 187 min her AHI = 4.6 which is normal, average O2 sat 94% I don't think   . Osteoarthritis 02/20/2003   Overview:  Mild bilateral hands Right CMC Right hip Left knee s/p meniscus tear All symptomatically stable Overview:  Overview:  Mild bilateral hands Right CMC Right hip Left knee s/p meniscus tear All symptomatically stable  . Osteoporosis 02/20/2003   Overview:  Bone  density 2004 LS -3.32 and RH-1.62 Bone density 2008 LS -3.14 and RH -1.22 Bone density 2009 LS -3.17 and RH -1.21 Bone density 2010 LS -3.14 and RH -1.42  Bone density 2014 LS -2.7 (L3 -3.1) and RH -1.5 Bone density 2017 LS -2.3 and RH -1.4 (neck -1.7) Vitamin D level 38 in 2012 On Boniva 2004 to 2010, restarted 2015 Repeat bone density in 2020 and stop Boniva  Last Assessment & Pl  . Prediabetes 02/20/2003   Overview:  Glucose goal < 100, last value 91 in 2017 HgbA1c goal < 6.0, last value 5.6 in 2017 Urine microalbumin negative in 2017 On diet and exercise  . Recurrent UTI 10/19/2014  . Squamous cell carcinoma of skin 03/14/2019   Left mid brow. SCCis, hypertrophic. EDC   Past Surgical History:  Procedure Laterality Date  . ABDOMINAL HYSTERECTOMY  1975  . URETEROSCOPY    . URETEROSCOPY WITH HOLMIUM LASER LITHOTRIPSY     Family History  Problem Relation Age of Onset  . Breast cancer Mother   . Skin cancer Father    Social History   Socioeconomic History  . Marital status: Widowed    Spouse name: Not on file  . Number of children: Not on file  . Years of education: Not on file  . Highest education level: Not on file  Occupational History  . Not on file  Tobacco Use  . Smoking status: Never Smoker  . Smokeless tobacco: Never Used  Vaping Use  . Vaping Use: Never used  Substance and Sexual Activity  . Alcohol use: No  . Drug use: No  . Sexual activity: Not Currently    Birth control/protection: Post-menopausal  Other Topics Concern  . Not on file  Social History Narrative  . Not on file   Social Determinants of Health   Financial Resource Strain: Low Risk   . Difficulty of Paying Living Expenses: Not hard at all  Food Insecurity: No Food Insecurity  . Worried About Charity fundraiser in the Last Year: Never true  . Ran Out of Food in the Last Year: Never true  Transportation Needs: No Transportation Needs  . Lack of Transportation (Medical): No  . Lack of Transportation  (Non-Medical): No  Physical Activity: Sufficiently Active  . Days of Exercise per Week: 5 days  . Minutes of Exercise per Session: 30 min  Stress: No Stress Concern Present  . Feeling of Stress : Not at all  Social Connections: Unknown  . Frequency of Communication with Friends and Family: More than three times a week  . Frequency of Social Gatherings with Friends and Family: More than three times a week  . Attends Religious Services: Not on file  . Active Member of Clubs or Organizations: Not on file  . Attends Archivist Meetings: Not on file  . Marital Status: Widowed    Outpatient Encounter Medications as of 10/15/2020  Medication Sig  . amLODipine (NORVASC) 5  MG tablet Take 1 tablet (5 mg total) by mouth daily.  Marland Kitchen escitalopram (LEXAPRO) 10 MG tablet TAKE 1 TABLET EVERY DAY  . famotidine (PEPCID) 20 MG tablet Take 1 tablet (20 mg total) by mouth daily.  Marland Kitchen levothyroxine (SYNTHROID, LEVOTHROID) 75 MCG tablet   . Multiple Vitamin (MULTIVITAMIN) capsule Take by mouth.  . mupirocin ointment (BACTROBAN) 2 % Apply 1 application topically 2 (two) times daily.  . pravastatin (PRAVACHOL) 40 MG tablet TAKE 1 TABLET (40 MG TOTAL) BY MOUTH EVERY EVENING.  . trospium (SANCTURA) 20 MG tablet 1 tablet 1 hour prior to bedtime   No facility-administered encounter medications on file as of 10/15/2020.    Review of Systems  Constitutional: Negative for appetite change and unexpected weight change.  HENT: Negative for congestion and sinus pressure.   Respiratory: Negative for cough, chest tightness and shortness of breath.   Cardiovascular: Negative for chest pain, palpitations and leg swelling.  Gastrointestinal: Negative for abdominal pain, diarrhea, nausea and vomiting.  Genitourinary: Negative for difficulty urinating and dysuria.  Musculoskeletal: Negative for joint swelling and myalgias.  Skin: Negative for color change and rash.  Neurological: Negative for dizziness,  light-headedness and headaches.  Psychiatric/Behavioral: Negative for agitation and dysphoric mood.       Objective:    Physical Exam Vitals reviewed.  Constitutional:      General: She is not in acute distress.    Appearance: Normal appearance.  HENT:     Head: Normocephalic and atraumatic.     Right Ear: External ear normal.     Left Ear: External ear normal.     Mouth/Throat:     Mouth: Oropharynx is clear and moist.  Eyes:     General: No scleral icterus.       Right eye: No discharge.        Left eye: No discharge.     Conjunctiva/sclera: Conjunctivae normal.  Neck:     Thyroid: No thyromegaly.  Cardiovascular:     Rate and Rhythm: Normal rate and regular rhythm.  Pulmonary:     Effort: No respiratory distress.     Breath sounds: Normal breath sounds. No wheezing.  Abdominal:     General: Bowel sounds are normal.     Palpations: Abdomen is soft.     Tenderness: There is no abdominal tenderness.  Musculoskeletal:        General: No swelling, tenderness or edema.     Cervical back: Neck supple. No tenderness.  Lymphadenopathy:     Cervical: No cervical adenopathy.  Skin:    Findings: No rash.     Comments: Minimal erythema - left first finger - around the nailbed.  No redness extending up the finger.    Neurological:     Mental Status: She is alert.  Psychiatric:        Mood and Affect: Mood normal.        Behavior: Behavior normal.     BP 120/78   Pulse 80   Temp 98.1 F (36.7 C)   Ht '5\' 3"'  (1.6 m)   Wt 142 lb 2 oz (64.5 kg)   SpO2 98%   BMI 25.18 kg/m  Wt Readings from Last 3 Encounters:  10/15/20 142 lb 2 oz (64.5 kg)  09/23/20 144 lb 3.2 oz (65.4 kg)  07/16/20 142 lb 3.2 oz (64.5 kg)     Lab Results  Component Value Date   WBC 7.4 07/20/2020   HGB 15.0 07/20/2020   HCT 45.4 07/20/2020  PLT 248.0 07/20/2020   GLUCOSE 79 07/21/2020   CHOL 166 07/20/2020   TRIG 111.0 07/20/2020   HDL 68.10 07/20/2020   LDLCALC 76 07/20/2020   ALT 13  07/20/2020   AST 20 07/20/2020   NA 142 07/21/2020   K 4.1 07/21/2020   CL 105 07/21/2020   CREATININE 0.80 07/21/2020   BUN 14 07/21/2020   CO2 25 07/21/2020   TSH 1.13 07/20/2020   HGBA1C 5.6 07/20/2020    DG Chest 2 View  Result Date: 04/10/2020 CLINICAL DATA:  Persistent cough and congestion EXAM: CHEST - 2 VIEW COMPARISON:  10/02/2019 FINDINGS: No focal opacity or pleural effusion. Mild atelectasis or scarring at the left lung base. Stable cardiomediastinal silhouette. No pneumothorax. IMPRESSION: No active cardiopulmonary disease. Electronically Signed   By: Donavan Foil M.D.   On: 04/10/2020 22:02       Assessment & Plan:   Problem List Items Addressed This Visit    Acid reflux    Continue pepcid.  Follow.       Depression, major, single episode, complete remission ([place])    Depression - history.  On lexapro.  Appears to be stable.  Follow.        Erythema of finger   Essential (primary) hypertension    On amlodipine.  Blood pressure doing well.  Follow pressures.  Follow metabolic panel.       Relevant Orders   Basic metabolic panel   Hyperglycemia    Follow met b and a1c.       Relevant Orders   Hemoglobin A1c   Hyperlipidemia    On pravastatin.  Low cholesterol diet and exercise.  Follow lipid panel and liver function tests.        Relevant Orders   Hepatic function panel   Lipid panel   Hypothyroidism    On thyroid replacement.  Follow tsh.       Nocturia    Is followed by urology.  Plan for home sleep test as outlined.        OSA (obstructive sleep apnea)    Prior history of sleep apnea.  Previously used cpap.  With increased fatigue and increased nocturia.  Referred back to pulmonary.  Note reviewed.  Planning for home sleep test.  Avoid sleeping supine.  Avoid sedating medication.           Einar Pheasant, MD

## 2020-10-17 ENCOUNTER — Encounter: Payer: Self-pay | Admitting: Internal Medicine

## 2020-10-17 DIAGNOSIS — L539 Erythematous condition, unspecified: Secondary | ICD-10-CM | POA: Insufficient documentation

## 2020-10-17 MED ORDER — MUPIROCIN 2 % EX OINT: 1.0000 "application " | TOPICAL_OINTMENT | Freq: Two times a day (BID) | CUTANEOUS | 0 refills | Status: DC

## 2020-10-17 NOTE — Assessment & Plan Note (Signed)
On amlodipine.  Blood pressure doing well.  Follow pressures.  Follow metabolic panel.

## 2020-10-17 NOTE — Assessment & Plan Note (Signed)
Continue pepcid.  Follow.  

## 2020-10-17 NOTE — Assessment & Plan Note (Addendum)
Prior history of sleep apnea.  Previously used cpap.  With increased fatigue and increased nocturia.  Referred back to pulmonary.  Note reviewed.  Planning for home sleep test.  Avoid sleeping supine.  Avoid sedating medication.

## 2020-10-17 NOTE — Assessment & Plan Note (Signed)
On thyroid replacement.  Follow tsh.  

## 2020-10-17 NOTE — Assessment & Plan Note (Signed)
Follow met b and a1c.  

## 2020-10-17 NOTE — Assessment & Plan Note (Signed)
Depression - history.  On lexapro.  Appears to be stable.  Follow.

## 2020-10-17 NOTE — Assessment & Plan Note (Signed)
On pravastatin.  Low cholesterol diet and exercise.  Follow lipid panel and liver function tests.   

## 2020-10-17 NOTE — Assessment & Plan Note (Signed)
Is followed by urology.  Plan for home sleep test as outlined.

## 2020-10-20 ENCOUNTER — Other Ambulatory Visit: Payer: Self-pay | Admitting: Internal Medicine

## 2020-10-21 ENCOUNTER — Ambulatory Visit: Payer: PRIVATE HEALTH INSURANCE | Admitting: Adult Health

## 2020-10-21 ENCOUNTER — Ambulatory Visit: Payer: PRIVATE HEALTH INSURANCE | Admitting: Primary Care

## 2020-10-28 DIAGNOSIS — H903 Sensorineural hearing loss, bilateral: Secondary | ICD-10-CM | POA: Diagnosis not present

## 2020-10-28 DIAGNOSIS — H6123 Impacted cerumen, bilateral: Secondary | ICD-10-CM | POA: Diagnosis not present

## 2020-10-29 ENCOUNTER — Ambulatory Visit: Payer: PRIVATE HEALTH INSURANCE

## 2020-10-29 ENCOUNTER — Other Ambulatory Visit: Payer: Self-pay

## 2020-10-29 DIAGNOSIS — R0683 Snoring: Secondary | ICD-10-CM | POA: Diagnosis not present

## 2020-10-29 DIAGNOSIS — G4719 Other hypersomnia: Secondary | ICD-10-CM

## 2020-11-04 DIAGNOSIS — G4733 Obstructive sleep apnea (adult) (pediatric): Secondary | ICD-10-CM | POA: Diagnosis not present

## 2020-11-04 DIAGNOSIS — R0683 Snoring: Secondary | ICD-10-CM | POA: Diagnosis not present

## 2020-11-05 ENCOUNTER — Ambulatory Visit (INDEPENDENT_AMBULATORY_CARE_PROVIDER_SITE_OTHER): Payer: Medicare HMO | Admitting: Adult Health

## 2020-11-05 ENCOUNTER — Other Ambulatory Visit: Payer: Self-pay

## 2020-11-05 DIAGNOSIS — G4719 Other hypersomnia: Secondary | ICD-10-CM

## 2020-11-05 NOTE — Progress Notes (Signed)
Reviewed and agree with assessment/plan.   Chesley Mires, MD [facility] Pulmonary/Critical Care 11/05/2020, 5:41 PM Pager:  [phone]

## 2020-11-05 NOTE — Patient Instructions (Addendum)
Healthy sleep regimen. Follow-up as needed Continue follow with primary care

## 2020-11-05 NOTE — Progress Notes (Signed)
Virtual Visit via Video Note  I connected with Jennifer Bernard on 11/05/20 at  4:30 PM EDT by a video enabled telemedicine application and verified that I am speaking with the correct person using two identifiers.  Location: Patient: Home  Provider: Office    I discussed the limitations of evaluation and management by telemedicine and the availability of in person appointments. The patient expressed understanding and agreed to proceed.  History of Present Illness: 87 year old female never smoker seen September 23, 2020 for sleep consult.  Today's telemedicine visit is a follow-up visit from last month for sleep.  Patient presented last month with increased episodes of fatigue low energy and restless sleep.  She was suspected to have possible underlying sleep apnea.  Patient was set up for a home sleep study for this was completed on October 29, 2020.  This showed an AHI at 3.8/hour with an SPO2 low at 86%.  Total sleep study time was 679 minutes.  Average O2 saturation was 90%.  We went over her sleep study results and detail.  Patient education given on sleep apnea. Patient says she has nocturia at least 2-3 times a night.  And gets up to go the bathroom.  Feels tired quite a bit.  Past Medical History:  Diagnosis Date  . Asymptomatic menopausal state 02/20/1984   Overview:  Naturally occuring and asymptomatic Vaginal atrophy present on topical estrogen Overview:  Overview:  Naturally occuring and asymptomatic Vaginal atrophy present on topical estrogen  . Basal cell carcinoma of skin 02/19/2013   Overview:  Basal cell on her nasolabial fold 2014 S/p Mohs by Dr. Lacinda Axon Followed by local dermatologist regularly Overview:  Overview:  Followed by Dr. Lacinda Axon  . Cyst of ovary 02/19/2009   Overview:  Ovarian cyst on right stable 2004-2010 Cyst ? larger on CT 2012 Stable on follow up US Followed by Dr. Edwinna Areola Overview:  Overview:  Ovarian cyst on right stable 2004-2010 Cyst ? larger on CT 2012 Stable  on follow up US Followed by Dr. Edwinna Areola  . Depression, major, single episode, complete remission ([place]) 07/18/2017  . Diffuse cystic mastopathy of breast 02/19/2013   Overview:  Mother with breast cancer later in life Two breast biopsies both with normal pathology Annual mammograms recommended, last 2016 Followed at the [facility]:  Relevant Hx: Course: Daily Update: Today's Plan: Overview:  Overview:  Mother with breast cancer later in life Two breast biopsies both with normal pathology Annual mammograms recommended, last 51  . Essential (primary) hypertension 02/19/2013   Overview:  Goal blood pressure < 135/85 On atenolol Overview:  Overview:  Goal blood pressure < 135/85 On atenolol  . Hyperlipidemia 02/19/2013   Overview:  Goal LDL < 130, last value 92 in 2017 On Pravachol Overview:  Overview:  Goal LDL < 130, last value 82 in 2012 On Pravachol  . Hypothyroidism 02/28/2012   Overview:  started on T4  TFT normal 2017 Followed by Dr. Almon Hercules Overview:  Overview:  started on T4  TFT normal 2014 Followed by Dr. Almon Hercules  . Impaired glucose tolerance 02/20/2003   Overview:  Overview:  Glucose goal < 100, last value 94 in 2014 HgbA1c goal < 6.0, last value 5.3 in 2014 Urine microalbumin negative in 2014 On diet and exercise  . Nontoxic multinodular goiter 03/06/2003   Overview:  Asymptomatic nodular thyroid Stable [place] 2004-2014 Followed by Dr. Almon Hercules Overview:  Overview:  Asymptomatic nodular thyroid Stable [place] 2004-2014 Followed  by Dr. Almon Hercules  . OSA on CPAP 01/13/2015   Overview:  Sleep study sent to medical records:   Sleep efficiency 22%, latency 24 min, # awakenings 11, res desat N=10, index 17.8  Apnea 1 for 16 sec, hypopnea 16 for 18 sec, total sleep time only 90 min so index = 11 She did not sleep much and the index puts her in mild category Repeat study a few days later when she slept 187 min her AHI = 4.6 which is normal, average O2 sat 94% I don't think   .  Osteoarthritis 02/20/2003   Overview:  Mild bilateral hands Right CMC Right hip Left knee s/p meniscus tear All symptomatically stable Overview:  Overview:  Mild bilateral hands Right CMC Right hip Left knee s/p meniscus tear All symptomatically stable  . Osteoporosis 02/20/2003   Overview:  Bone density 2004 LS -3.32 and RH-1.62 Bone density 2008 LS -3.14 and RH -1.22 Bone density 2009 LS -3.17 and RH -1.21 Bone density 2010 LS -3.14 and RH -1.42  Bone density 2014 LS -2.7 (L3 -3.1) and RH -1.5 Bone density 2017 LS -2.3 and RH -1.4 (neck -1.7) Vitamin D level 38 in 2012 On Boniva 2004 to 2010, restarted 2015 Repeat bone density in 2020 and stop Boniva  Last Assessment & Pl  . Prediabetes 02/20/2003   Overview:  Glucose goal < 100, last value 91 in 2017 HgbA1c goal < 6.0, last value 5.6 in 2017 Urine microalbumin negative in 2017 On diet and exercise  . Recurrent UTI 10/19/2014  . Squamous cell carcinoma of skin 03/14/2019   Left mid brow. SCCis, hypertrophic. EDC    Current Outpatient Medications on File Prior to Visit  Medication Sig Dispense Refill  . amLODipine (NORVASC) 5 MG tablet TAKE 1 TABLET BY MOUTH ONCE DAILY. 90 tablet 1  . escitalopram (LEXAPRO) 10 MG tablet TAKE 1 TABLET EVERY DAY 90 tablet 1  . famotidine (PEPCID) 20 MG tablet TAKE ONE TABLET EVERY DAY 30 tablet 2  . levothyroxine (SYNTHROID, LEVOTHROID) 75 MCG tablet     . Multiple Vitamin (MULTIVITAMIN) capsule Take by mouth.    . mupirocin ointment (BACTROBAN) 2 % Apply 1 application topically 2 (two) times daily. 22 g 0  . pravastatin (PRAVACHOL) 40 MG tablet TAKE ONE TABLET EACH EVENING 90 tablet 1  . trospium (SANCTURA) 20 MG tablet 1 tablet 1 hour prior to bedtime (Patient not taking: Reported on 11/05/2020) 30 tablet 0   No current facility-administered medications on file prior to visit.        Observations/Objective: Speaks in full sentences with no audible distress Home sleep study October 29, 2020 showed AHI at 3.8/h SPO2  low at 86%.   Assessment and Plan: No significant sleep apnea noted on home sleep study.  We reviewed her sleep study results.  Patient education was given.  Patient has no significant sleep apnea on home sleep study.  There was an adequate time for the sleep study.  Advised patient on healthy sleep regimen. Patient did have some minimum mild desaturations.  Average O2 saturations were at 90%.  If symptoms continue could consider an overnight oximetry test on room air.  Also if symptoms persisted with significant daytime symptoms could consider in lab study.  At this time do not feel that these are indicated   Plan  Patient Instructions  Healthy sleep regimen. Follow-up as needed Continue follow with primary care      Follow Up Instructions:    I discussed  the assessment and treatment plan with the patient. The patient was provided an opportunity to ask questions and all were answered. The patient agreed with the plan and demonstrated an understanding of the instructions.   The patient was advised to call back or seek an in-person evaluation if the symptoms worsen or if the condition fails to improve as anticipated.  I provided 16  minutes of non-face-to-face time during this encounter.   Rexene Edison, NP

## 2020-11-06 ENCOUNTER — Other Ambulatory Visit: Payer: Self-pay | Admitting: Internal Medicine

## 2020-11-06 DIAGNOSIS — Z1231 Encounter for screening mammogram for malignant neoplasm of breast: Secondary | ICD-10-CM

## 2020-11-16 ENCOUNTER — Other Ambulatory Visit: Payer: Self-pay

## 2020-11-16 ENCOUNTER — Ambulatory Visit
Admission: RE | Admit: 2020-11-16 | Discharge: 2020-11-16 | Disposition: A | Discharge status: Home / Self Care | Payer: Medicare HMO | Source: Ambulatory Visit | Attending: Internal Medicine | Admitting: Internal Medicine

## 2020-11-16 DIAGNOSIS — Z1231 Encounter for screening mammogram for malignant neoplasm of breast: Secondary | ICD-10-CM | POA: Diagnosis not present

## 2020-11-18 ENCOUNTER — Other Ambulatory Visit: Payer: Self-pay | Admitting: Internal Medicine

## 2020-11-18 DIAGNOSIS — R928 Other abnormal and inconclusive findings on diagnostic imaging of breast: Secondary | ICD-10-CM

## 2020-11-18 DIAGNOSIS — N6320 Unspecified lump in the left breast, unspecified quadrant: Secondary | ICD-10-CM

## 2020-11-18 NOTE — Progress Notes (Signed)
Orders signed.

## 2020-11-23 ENCOUNTER — Other Ambulatory Visit: Payer: Self-pay | Admitting: Internal Medicine

## 2020-11-23 DIAGNOSIS — R928 Other abnormal and inconclusive findings on diagnostic imaging of breast: Secondary | ICD-10-CM

## 2020-11-23 NOTE — Progress Notes (Signed)
Order placed for f/u left breast mammogram and ultrasound.   

## 2020-11-24 ENCOUNTER — Ambulatory Visit
Admission: RE | Admit: 2020-11-24 | Discharge: 2020-11-24 | Disposition: A | Discharge status: Home / Self Care | Payer: Medicare HMO | Source: Ambulatory Visit | Attending: Internal Medicine | Admitting: Internal Medicine

## 2020-11-24 ENCOUNTER — Other Ambulatory Visit: Payer: Self-pay

## 2020-11-24 DIAGNOSIS — R928 Other abnormal and inconclusive findings on diagnostic imaging of breast: Secondary | ICD-10-CM | POA: Insufficient documentation

## 2020-11-24 DIAGNOSIS — R922 Inconclusive mammogram: Secondary | ICD-10-CM | POA: Diagnosis not present

## 2020-11-24 DIAGNOSIS — N6320 Unspecified lump in the left breast, unspecified quadrant: Secondary | ICD-10-CM | POA: Diagnosis not present

## 2020-11-26 ENCOUNTER — Other Ambulatory Visit: Payer: Self-pay | Admitting: Internal Medicine

## 2020-11-26 DIAGNOSIS — R928 Other abnormal and inconclusive findings on diagnostic imaging of breast: Secondary | ICD-10-CM

## 2020-11-26 NOTE — Progress Notes (Signed)
Order placed for surgery referral.  

## 2020-12-01 ENCOUNTER — Other Ambulatory Visit: Payer: Self-pay | Admitting: General Surgery

## 2020-12-01 DIAGNOSIS — Z170 Estrogen receptor positive status [ER+]: Secondary | ICD-10-CM | POA: Diagnosis not present

## 2020-12-01 DIAGNOSIS — C50412 Malignant neoplasm of upper-outer quadrant of left female breast: Secondary | ICD-10-CM | POA: Diagnosis not present

## 2020-12-01 DIAGNOSIS — C50112 Malignant neoplasm of central portion of left female breast: Secondary | ICD-10-CM | POA: Diagnosis not present

## 2020-12-01 DIAGNOSIS — N6342 Unspecified lump in left breast, subareolar: Secondary | ICD-10-CM | POA: Diagnosis not present

## 2020-12-07 DIAGNOSIS — D0592 Unspecified type of carcinoma in situ of left breast: Secondary | ICD-10-CM | POA: Diagnosis not present

## 2020-12-11 LAB — SURGICAL PATHOLOGY

## 2020-12-15 DIAGNOSIS — N6342 Unspecified lump in left breast, subareolar: Secondary | ICD-10-CM | POA: Diagnosis not present

## 2020-12-15 DIAGNOSIS — Z1231 Encounter for screening mammogram for malignant neoplasm of breast: Secondary | ICD-10-CM | POA: Diagnosis not present

## 2020-12-15 DIAGNOSIS — C50812 Malignant neoplasm of overlapping sites of left female breast: Secondary | ICD-10-CM | POA: Diagnosis not present

## 2020-12-16 ENCOUNTER — Other Ambulatory Visit: Payer: PRIVATE HEALTH INSURANCE

## 2020-12-16 DIAGNOSIS — Z170 Estrogen receptor positive status [ER+]: Secondary | ICD-10-CM | POA: Diagnosis not present

## 2020-12-16 DIAGNOSIS — C50112 Malignant neoplasm of central portion of left female breast: Secondary | ICD-10-CM | POA: Diagnosis not present

## 2020-12-16 DIAGNOSIS — C50912 Malignant neoplasm of unspecified site of left female breast: Secondary | ICD-10-CM | POA: Diagnosis not present

## 2020-12-18 ENCOUNTER — Ambulatory Visit: Admit: 2020-12-18 | Payer: Medicare HMO | Admitting: General Surgery

## 2020-12-18 SURGERY — BREAST LUMPECTOMY
Anesthesia: General | Laterality: Left

## 2020-12-21 ENCOUNTER — Other Ambulatory Visit: Payer: Self-pay | Admitting: Internal Medicine

## 2020-12-21 ENCOUNTER — Other Ambulatory Visit: Payer: Self-pay | Admitting: Urology

## 2020-12-30 DIAGNOSIS — C50112 Malignant neoplasm of central portion of left female breast: Secondary | ICD-10-CM | POA: Diagnosis not present

## 2020-12-30 DIAGNOSIS — Z170 Estrogen receptor positive status [ER+]: Secondary | ICD-10-CM | POA: Diagnosis not present

## 2020-12-30 DIAGNOSIS — C50912 Malignant neoplasm of unspecified site of left female breast: Secondary | ICD-10-CM | POA: Diagnosis not present

## 2021-01-12 ENCOUNTER — Telehealth: Payer: Self-pay | Admitting: Internal Medicine

## 2021-01-12 NOTE — Telephone Encounter (Signed)
Patient called in wanted to know if she need to have labs at both places she is going to [facility] for testing .

## 2021-01-13 ENCOUNTER — Other Ambulatory Visit (INDEPENDENT_AMBULATORY_CARE_PROVIDER_SITE_OTHER): Payer: Medicare HMO

## 2021-01-13 ENCOUNTER — Other Ambulatory Visit: Payer: Self-pay

## 2021-01-13 DIAGNOSIS — R739 Hyperglycemia, unspecified: Secondary | ICD-10-CM

## 2021-01-13 DIAGNOSIS — I10 Essential (primary) hypertension: Secondary | ICD-10-CM

## 2021-01-13 DIAGNOSIS — E785 Hyperlipidemia, unspecified: Secondary | ICD-10-CM

## 2021-01-13 LAB — BASIC METABOLIC PANEL
BUN: 15 mg/dL (ref 6–23)
CO2: 29 mEq/L (ref 19–32)
Calcium: 9.3 mg/dL (ref 8.4–10.5)
Chloride: 106 mEq/L (ref 96–112)
Creatinine, Ser: 0.76 mg/dL (ref 0.40–1.20)
GFR: 68.32 mL/min (ref >60.00)
Glucose, Bld: 92 mg/dL (ref 70–99)
Potassium: 4.1 mEq/L (ref 3.5–5.1)
Sodium: 143 mEq/L (ref 135–145)

## 2021-01-13 LAB — LIPID PANEL
Cholesterol: 183 mg/dL (ref 0–200)
HDL: 65.8 mg/dL (ref >39.00)
LDL Cholesterol: 89 mg/dL (ref 0–99)
NonHDL: 117.25
Total CHOL/HDL Ratio: 3
Triglycerides: 142 mg/dL (ref 0.0–149.0)
VLDL: 28.4 mg/dL (ref 0.0–40.0)

## 2021-01-13 LAB — HEMOGLOBIN A1C: Hgb A1c MFr Bld: 5.7 % (ref 4.6–6.5)

## 2021-01-13 LAB — HEPATIC FUNCTION PANEL
ALT: 12 U/L (ref 0–35)
AST: 16 U/L (ref 0–37)
Albumin: 4 g/dL (ref 3.5–5.2)
Alkaline Phosphatase: 62 U/L (ref 39–117)
Bilirubin, Direct: 0.2 mg/dL (ref 0.0–0.3)
Total Bilirubin: 1 mg/dL (ref 0.2–1.2)
Total Protein: 6.3 g/dL (ref 6.0–8.3)

## 2021-01-13 NOTE — Telephone Encounter (Signed)
Patient had labs done this AM

## 2021-01-14 ENCOUNTER — Telehealth: Payer: Self-pay

## 2021-01-14 NOTE — Telephone Encounter (Signed)
Left message to call back  

## 2021-01-18 ENCOUNTER — Ambulatory Visit: Payer: PRIVATE HEALTH INSURANCE | Admitting: Internal Medicine

## 2021-01-19 DIAGNOSIS — E039 Hypothyroidism, unspecified: Secondary | ICD-10-CM | POA: Diagnosis not present

## 2021-01-19 DIAGNOSIS — M810 Age-related osteoporosis without current pathological fracture: Secondary | ICD-10-CM | POA: Diagnosis not present

## 2021-01-19 DIAGNOSIS — Z170 Estrogen receptor positive status [ER+]: Secondary | ICD-10-CM | POA: Diagnosis not present

## 2021-01-19 DIAGNOSIS — I10 Essential (primary) hypertension: Secondary | ICD-10-CM | POA: Diagnosis not present

## 2021-01-19 DIAGNOSIS — Z01818 Encounter for other preprocedural examination: Secondary | ICD-10-CM | POA: Diagnosis not present

## 2021-01-19 DIAGNOSIS — C50112 Malignant neoplasm of central portion of left female breast: Secondary | ICD-10-CM | POA: Diagnosis not present

## 2021-01-19 DIAGNOSIS — E042 Nontoxic multinodular goiter: Secondary | ICD-10-CM | POA: Diagnosis not present

## 2021-01-19 DIAGNOSIS — R7303 Prediabetes: Secondary | ICD-10-CM | POA: Diagnosis not present

## 2021-01-19 DIAGNOSIS — R011 Cardiac murmur, unspecified: Secondary | ICD-10-CM | POA: Diagnosis not present

## 2021-01-19 DIAGNOSIS — E7800 Pure hypercholesterolemia, unspecified: Secondary | ICD-10-CM | POA: Diagnosis not present

## 2021-01-19 NOTE — Telephone Encounter (Signed)
Left message to call back for lab results.

## 2021-01-20 ENCOUNTER — Other Ambulatory Visit: Payer: Self-pay | Admitting: Internal Medicine

## 2021-01-25 DIAGNOSIS — Z01818 Encounter for other preprocedural examination: Secondary | ICD-10-CM | POA: Diagnosis not present

## 2021-01-25 DIAGNOSIS — C50112 Malignant neoplasm of central portion of left female breast: Secondary | ICD-10-CM | POA: Diagnosis not present

## 2021-01-25 DIAGNOSIS — Z170 Estrogen receptor positive status [ER+]: Secondary | ICD-10-CM | POA: Diagnosis not present

## 2021-01-25 DIAGNOSIS — U071 COVID-19: Secondary | ICD-10-CM | POA: Diagnosis not present

## 2021-01-26 ENCOUNTER — Telehealth: Payer: Self-pay

## 2021-01-26 NOTE — Telephone Encounter (Signed)
Called patient and she stated that she is feeling ok. She is having a slight cough but nothing that is really bothering her. She says she is feeling like her normal self. Patient went and had a PCR test done today to confirm COVID positive. Her masectomy has been rescheduled out 2 weeks. Advised patient to let us know if she needs anything.

## 2021-01-27 NOTE — Telephone Encounter (Signed)
If covid positive and cough, can schedule virtual appt if agreeable - to confirm nothing more needed.

## 2021-01-27 NOTE — Telephone Encounter (Signed)
Called patient. Line was busy

## 2021-01-28 NOTE — Telephone Encounter (Signed)
Called patient to see how she was feeling and she says that the PCR test that she had done at [facility] came back negative and that she is feeling fine.

## 2021-01-29 NOTE — Telephone Encounter (Signed)
Called Jennifer Bernard this am.  Doing well.  No symptoms reported.  Per our discussion, you were going to f/u on PCR test - to get copy

## 2021-01-29 NOTE — Telephone Encounter (Signed)
Med Release faxed to [facility] to receive test results.

## 2021-02-02 ENCOUNTER — Other Ambulatory Visit: Payer: Self-pay | Admitting: Internal Medicine

## 2021-02-03 ENCOUNTER — Other Ambulatory Visit: Payer: Self-pay | Admitting: Internal Medicine

## 2021-02-08 DIAGNOSIS — C50812 Malignant neoplasm of overlapping sites of left female breast: Secondary | ICD-10-CM | POA: Diagnosis not present

## 2021-02-08 DIAGNOSIS — G4730 Sleep apnea, unspecified: Secondary | ICD-10-CM | POA: Diagnosis not present

## 2021-02-08 DIAGNOSIS — R7303 Prediabetes: Secondary | ICD-10-CM | POA: Diagnosis not present

## 2021-02-08 DIAGNOSIS — E042 Nontoxic multinodular goiter: Secondary | ICD-10-CM | POA: Diagnosis not present

## 2021-02-08 DIAGNOSIS — E039 Hypothyroidism, unspecified: Secondary | ICD-10-CM | POA: Diagnosis not present

## 2021-02-08 DIAGNOSIS — Z170 Estrogen receptor positive status [ER+]: Secondary | ICD-10-CM | POA: Diagnosis not present

## 2021-02-08 DIAGNOSIS — E7800 Pure hypercholesterolemia, unspecified: Secondary | ICD-10-CM | POA: Diagnosis not present

## 2021-02-08 DIAGNOSIS — M810 Age-related osteoporosis without current pathological fracture: Secondary | ICD-10-CM | POA: Diagnosis not present

## 2021-02-08 DIAGNOSIS — C50112 Malignant neoplasm of central portion of left female breast: Secondary | ICD-10-CM | POA: Diagnosis not present

## 2021-02-08 DIAGNOSIS — C50912 Malignant neoplasm of unspecified site of left female breast: Secondary | ICD-10-CM | POA: Diagnosis not present

## 2021-02-08 DIAGNOSIS — I10 Essential (primary) hypertension: Secondary | ICD-10-CM | POA: Diagnosis not present

## 2021-02-09 DIAGNOSIS — M810 Age-related osteoporosis without current pathological fracture: Secondary | ICD-10-CM | POA: Diagnosis not present

## 2021-02-09 DIAGNOSIS — G4730 Sleep apnea, unspecified: Secondary | ICD-10-CM | POA: Diagnosis not present

## 2021-02-09 DIAGNOSIS — I10 Essential (primary) hypertension: Secondary | ICD-10-CM | POA: Diagnosis not present

## 2021-02-09 DIAGNOSIS — C50112 Malignant neoplasm of central portion of left female breast: Secondary | ICD-10-CM | POA: Diagnosis not present

## 2021-02-09 DIAGNOSIS — E7800 Pure hypercholesterolemia, unspecified: Secondary | ICD-10-CM | POA: Diagnosis not present

## 2021-02-09 DIAGNOSIS — E042 Nontoxic multinodular goiter: Secondary | ICD-10-CM | POA: Diagnosis not present

## 2021-02-09 DIAGNOSIS — E039 Hypothyroidism, unspecified: Secondary | ICD-10-CM | POA: Diagnosis not present

## 2021-02-09 DIAGNOSIS — R7303 Prediabetes: Secondary | ICD-10-CM | POA: Diagnosis not present

## 2021-02-09 DIAGNOSIS — Z170 Estrogen receptor positive status [ER+]: Secondary | ICD-10-CM | POA: Diagnosis not present

## 2021-02-23 DIAGNOSIS — Z170 Estrogen receptor positive status [ER+]: Secondary | ICD-10-CM | POA: Diagnosis not present

## 2021-02-23 DIAGNOSIS — C50112 Malignant neoplasm of central portion of left female breast: Secondary | ICD-10-CM | POA: Diagnosis not present

## 2021-02-23 DIAGNOSIS — Z9012 Acquired absence of left breast and nipple: Secondary | ICD-10-CM | POA: Diagnosis not present

## 2021-02-24 ENCOUNTER — Ambulatory Visit: Payer: PRIVATE HEALTH INSURANCE | Admitting: Internal Medicine

## 2021-03-08 ENCOUNTER — Ambulatory Visit: Payer: PRIVATE HEALTH INSURANCE | Admitting: Internal Medicine

## 2021-03-15 DIAGNOSIS — Z9012 Acquired absence of left breast and nipple: Secondary | ICD-10-CM | POA: Diagnosis not present

## 2021-03-15 DIAGNOSIS — Z4432 Encounter for fitting and adjustment of external left breast prosthesis: Secondary | ICD-10-CM | POA: Diagnosis not present

## 2021-03-15 DIAGNOSIS — C50112 Malignant neoplasm of central portion of left female breast: Secondary | ICD-10-CM | POA: Diagnosis not present

## 2021-03-18 IMAGING — CR ABDOMEN - 1 VIEW
1 series · 1 of 1 positions shown · non-contrast
Comparison: None

CLINICAL DATA: History of kidney stones

EXAM:
ABDOMEN - 1 VIEW

[dg abd 1 view]
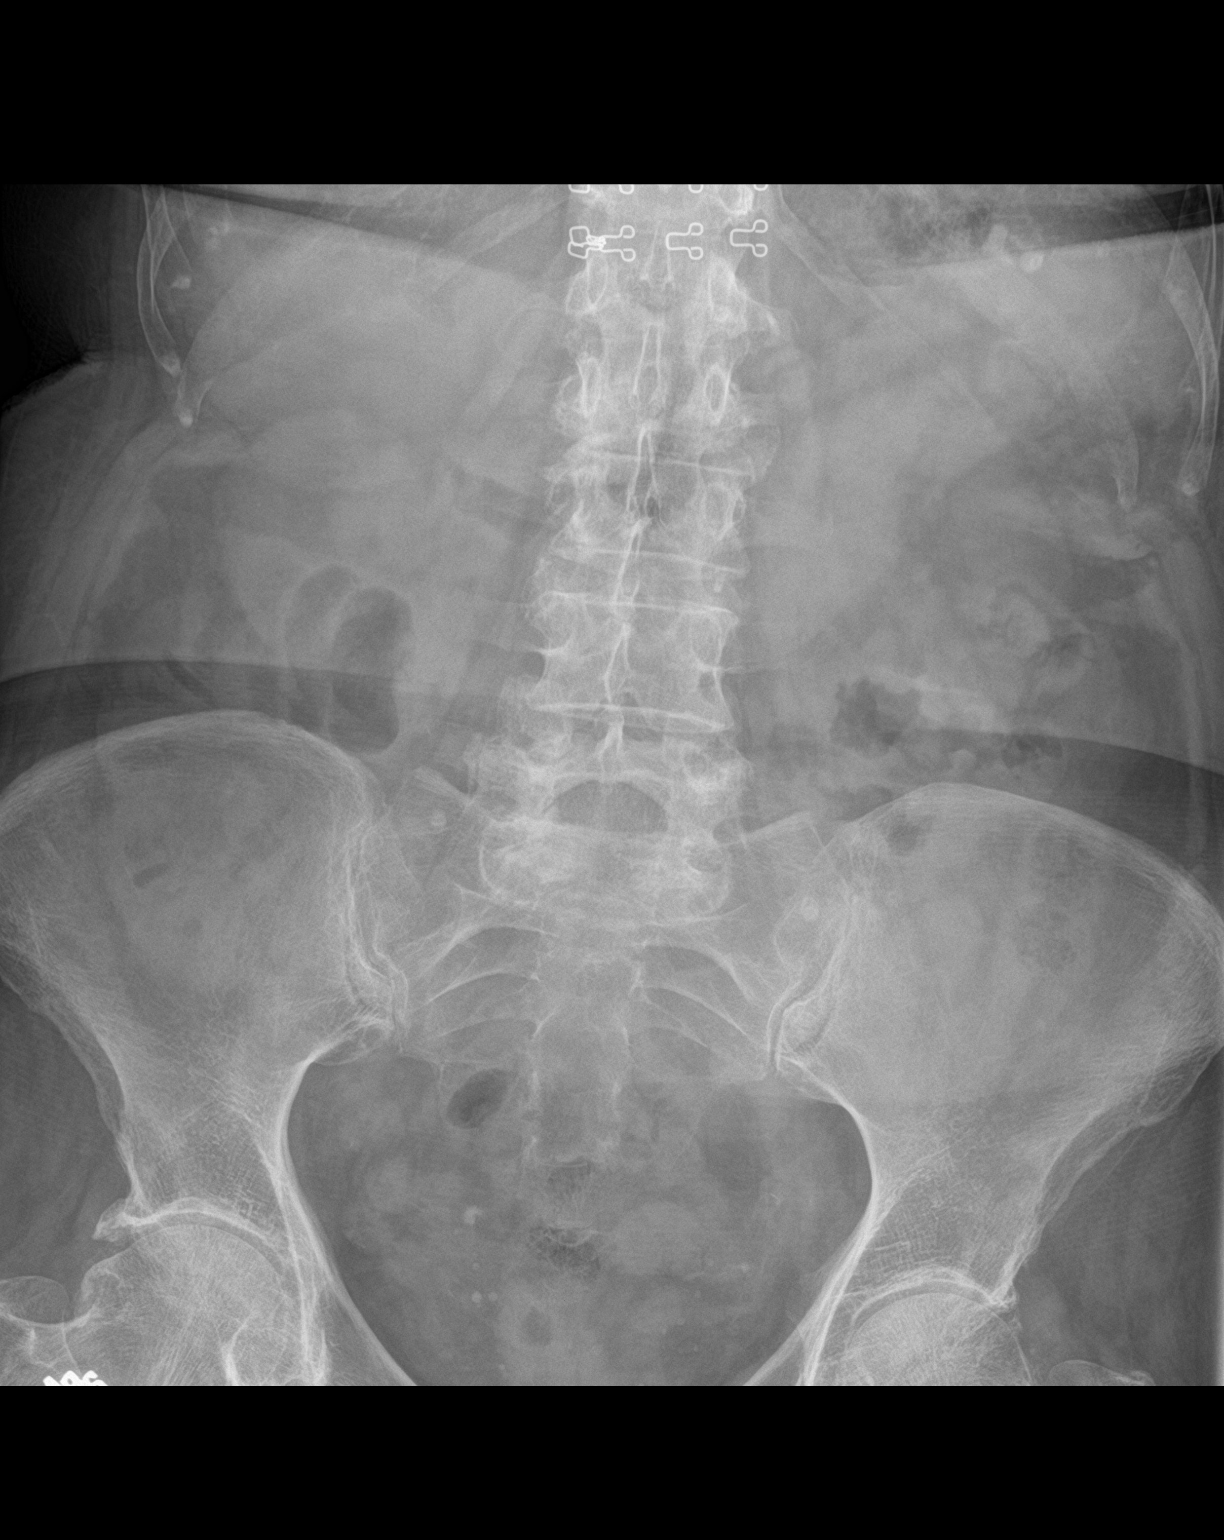

[1 of 1 positions shown; findings below may reference images not displayed]

FINDINGS: Small scattered calcifications in the pelvis felt represent
phleboliths. No evidence of bowel obstruction, organomegaly, or free
air. Degenerative changes in the hips and lumbar spine.
IMPRESSION: No acute findings.

## 2021-03-22 DIAGNOSIS — Z9012 Acquired absence of left breast and nipple: Secondary | ICD-10-CM | POA: Diagnosis not present

## 2021-03-22 DIAGNOSIS — Z4432 Encounter for fitting and adjustment of external left breast prosthesis: Secondary | ICD-10-CM | POA: Diagnosis not present

## 2021-03-22 DIAGNOSIS — C50112 Malignant neoplasm of central portion of left female breast: Secondary | ICD-10-CM | POA: Diagnosis not present

## 2021-03-23 DIAGNOSIS — C50112 Malignant neoplasm of central portion of left female breast: Secondary | ICD-10-CM | POA: Diagnosis not present

## 2021-03-23 DIAGNOSIS — Z170 Estrogen receptor positive status [ER+]: Secondary | ICD-10-CM | POA: Diagnosis not present

## 2021-03-25 ENCOUNTER — Encounter: Payer: Self-pay | Admitting: Urology

## 2021-03-25 ENCOUNTER — Other Ambulatory Visit: Payer: Self-pay

## 2021-03-25 ENCOUNTER — Ambulatory Visit (INDEPENDENT_AMBULATORY_CARE_PROVIDER_SITE_OTHER): Payer: Medicare HMO | Admitting: Urology

## 2021-03-25 VITALS — BP 124/81 | HR 90 | Ht 62.0 in | Wt 140.0 lb

## 2021-03-25 DIAGNOSIS — N390 Urinary tract infection, site not specified: Secondary | ICD-10-CM | POA: Diagnosis not present

## 2021-03-25 DIAGNOSIS — R351 Nocturia: Secondary | ICD-10-CM

## 2021-03-25 MED ORDER — TROSPIUM CHLORIDE 20 MG PO TABS: ORAL_TABLET | ORAL | 11 refills | Status: DC

## 2021-03-25 NOTE — Progress Notes (Signed)
03/25/2021 4:58 PM   [name] 1927-06-26 [id number]  Referring provider: Einar Pheasant, [id number],  [place] [address]  Chief Complaint  Patient presents with   Nocturia    Urologic history: 1.  Recurrent UTI   2.  History of stone disease -Ureteroscopic removal of a 7 mm left renal calculus 03/2015  HPI: 87 y.o. female presents for scheduled annual follow-up.  She called for a visit October 2021 and was seen for nocturia that was triggered when she had an episode of nocturia x4 History of sleep apnea but not on CPAP Was given a trial of immediate release trospium at bedtime and did not feel this was effective Presently she has nocturia 1 and occasionally 2 times per night She was seen at [facility] and states that based on her history sleep apnea evaluation was needed No recurrent UTIs since last visit No flank, abdominal or pelvic pain.  Denies gross hematuria   PMH: Past Medical History:  Diagnosis Date   Asymptomatic menopausal state 02/20/1984   Overview:  Naturally occuring and asymptomatic Vaginal atrophy present on topical estrogen Overview:  Overview:  Naturally occuring and asymptomatic Vaginal atrophy present on topical estrogen   Basal cell carcinoma of skin 02/19/2013   Overview:  Basal cell on her nasolabial fold 2014 S/p Mohs by Dr. Lacinda Axon Followed by local dermatologist regularly Overview:  Overview:  Followed by Dr. Lacinda Axon   Cyst of ovary 02/19/2009   Overview:  Ovarian cyst on right stable 2004-2010 Cyst ? larger on CT 2012 Stable on follow up US Followed by Dr. Edwinna Areola Overview:  Overview:  Ovarian cyst on right stable 2004-2010 Cyst ? larger on CT 2012 Stable on follow up US Followed by Dr. Edwinna Areola   Depression, major, single episode, complete remission ([place]) 07/18/2017   Diffuse cystic mastopathy of breast 02/19/2013   Overview:  Mother with breast cancer later in life Two breast biopsies both with normal  pathology Annual mammograms recommended, last 2016 Followed at the [facility]:  Relevant Hx: Course: Daily Update: Today's Plan: Overview:  Overview:  Mother with breast cancer later in life Two breast biopsies both with normal pathology Annual mammograms recommended, last 201   Essential (primary) hypertension 02/19/2013   Overview:  Goal blood pressure < 135/85 On atenolol Overview:  Overview:  Goal blood pressure < 135/85 On atenolol   Hyperlipidemia 02/19/2013   Overview:  Goal LDL < 130, last value 92 in 2017 On Pravachol Overview:  Overview:  Goal LDL < 130, last value 82 in 2012 On Pravachol   Hypothyroidism 02/28/2012   Overview:  started on T4  TFT normal 2017 Followed by Dr. Almon Hercules Overview:  Overview:  started on T4  TFT normal 2014 Followed by Dr. Almon Hercules   Impaired glucose tolerance 02/20/2003   Overview:  Overview:  Glucose goal < 100, last value 94 in 2014 HgbA1c goal < 6.0, last value 5.3 in 2014 Urine microalbumin negative in 2014 On diet and exercise   Nontoxic multinodular goiter 03/06/2003   Overview:  Asymptomatic nodular thyroid Stable [place] 2004-2014 Followed by Dr. Almon Hercules Overview:  Overview:  Asymptomatic nodular thyroid Stable [place] 2004-2014 Followed by Dr. Almon Hercules   OSA on CPAP 01/13/2015   Overview:  Sleep study sent to medical records:   Sleep efficiency 22%, latency 24 min, # awakenings 11, res desat N=10, index 17.8  Apnea 1 for 16 sec, hypopnea 16 for 18 sec,  total sleep time only 90 min so index = 11 She did not sleep much and the index puts her in mild category Repeat study a few days later when she slept 187 min her AHI = 4.6 which is normal, average O2 sat 94% I don't think    Osteoarthritis 02/20/2003   Overview:  Mild bilateral hands Right CMC Right hip Left knee s/p meniscus tear All symptomatically stable Overview:  Overview:  Mild bilateral hands Right CMC Right hip Left knee s/p meniscus tear All symptomatically stable   Osteoporosis  02/20/2003   Overview:  Bone density 2004 LS -3.32 and RH-1.62 Bone density 2008 LS -3.14 and RH -1.22 Bone density 2009 LS -3.17 and RH -1.21 Bone density 2010 LS -3.14 and RH -1.42  Bone density 2014 LS -2.7 (L3 -3.1) and RH -1.5 Bone density 2017 LS -2.3 and RH -1.4 (neck -1.7) Vitamin D level 38 in 2012 On Boniva 2004 to 2010, restarted 2015 Repeat bone density in 2020 and stop Boniva  Last Assessment & Pl   Prediabetes 02/20/2003   Overview:  Glucose goal < 100, last value 91 in 2017 HgbA1c goal < 6.0, last value 5.6 in 2017 Urine microalbumin negative in 2017 On diet and exercise   Recurrent UTI 10/19/2014   Squamous cell carcinoma of skin 03/14/2019   Left mid brow. SCCis, hypertrophic. EDC    Surgical History: Past Surgical History:  Procedure Laterality Date   ABDOMINAL HYSTERECTOMY  1975   MASTECTOMY Left    URETEROSCOPY     URETEROSCOPY WITH HOLMIUM LASER LITHOTRIPSY      Home Medications:  Allergies as of 03/25/2021       Reactions   Penicillins Hives   Other reaction(s): Unknown   Ceftin [cefuroxime Axetil] Diarrhea   GI distress   Ramipril    Other reaction(s): Cough Other reaction(s): Cough        Medication List        Accurate as of March 25, 2021  4:58 PM. If you have any questions, ask your nurse or doctor.          amLODipine 5 MG tablet Commonly known as: NORVASC TAKE 1 TABLET EVERY DAY   anastrozole 1 MG tablet Commonly known as: ARIMIDEX Take 1 mg by mouth daily.   escitalopram 10 MG tablet Commonly known as: LEXAPRO TAKE 1 TABLET BY MOUTH DAILY (FOR MOOD/DEPRESSION)   famotidine 20 MG tablet Commonly known as: PEPCID Take by mouth.   levothyroxine 75 MCG tablet Commonly known as: SYNTHROID   multivitamin capsule Take by mouth.   mupirocin ointment 2 % Commonly known as: BACTROBAN Apply 1 application topically 2 (two) times daily.   pravastatin 40 MG tablet Commonly known as: PRAVACHOL TAKE ONE TABLET EACH EVENING   trospium 20  MG tablet Commonly known as: SANCTURA 1 tablet 1 hour prior to bedtime        Allergies:  Allergies  Allergen Reactions   Penicillins Hives    Other reaction(s): Unknown   Ceftin [Cefuroxime Axetil] Diarrhea    GI distress   Ramipril     Other reaction(s): Cough Other reaction(s): Cough     Family History: Family History  Problem Relation Age of Onset   Breast cancer Mother    Skin cancer Father     Social History:  reports that she has never smoked. She has never used smokeless tobacco. She reports that she does not drink alcohol and does not use drugs.   Physical Exam: BP  124/81   Pulse 90   Ht '5\' 2"'$  (1.575 m)   Wt 140 lb (63.5 kg)   BMI 25.61 kg/m   Constitutional:  Alert and oriented, No acute distress. HEENT: [place] AT, moist mucus membranes.  Trachea midline, no masses. Cardiovascular: No clubbing, cyanosis, or edema. Respiratory: Normal respiratory effort, no increased work of breathing.   Assessment & Plan:    1.  History of recurrent UTIs No recurrent infections in the last 10 months  2.  Nocturia Minimal nocturia once, occasionally twice per night According to patient was felt sleep study not indicated I discussed with Ms. Wooton that I did not think we would be able to improve on nocturia x1 Would not recommend DDAVP due to potential side effects She did request a retrial of trospium and Rx was sent to [facility], [place] [address], [place], [place] [address] [phone]

## 2021-03-26 ENCOUNTER — Ambulatory Visit: Payer: Self-pay | Admitting: Urology

## 2021-03-26 ENCOUNTER — Ambulatory Visit (INDEPENDENT_AMBULATORY_CARE_PROVIDER_SITE_OTHER): Payer: Medicare HMO

## 2021-03-26 VITALS — Ht 62.0 in | Wt 140.0 lb

## 2021-03-26 DIAGNOSIS — Z0000 Encounter for general adult medical examination without abnormal findings: Secondary | ICD-10-CM

## 2021-03-26 NOTE — Progress Notes (Signed)
Subjective:   Jennifer Bernard is a 87 y.o. female who presents for Medicare Annual (Subsequent) preventive examination.  Review of Systems    No ROS.  Medicare Wellness Virtual Visit.  Visual/audio telehealth visit, UTA vital signs.   See social history for additional risk factors.   Cardiac Risk Factors include: advanced age (>78mn, >>33women)     Objective:    Today's Vitals   03/26/21 1410  Weight: 140 lb (63.5 kg)  Height: '5\' 2"'$  (1.575 m)   Body mass index is 25.61 kg/m.  Advanced Directives 03/26/2021 03/25/2020 10/22/2019  Does Patient Have a Medical Advance Directive? Yes Yes Yes  Type of [place] will [place] Living will;Healthcare Power of [place] of [place] DNR (pink MOST or yellow form)  Does patient want to make changes to medical advance directive? No - Patient declined No - Patient declined No - Patient declined  Copy of [place] Chart? No - copy requested No - copy requested -    Current Medications (verified) Outpatient Encounter Medications as of 03/26/2021  Medication Sig   amLODipine (NORVASC) 5 MG tablet TAKE 1 TABLET EVERY DAY   anastrozole (ARIMIDEX) 1 MG tablet Take 1 mg by mouth daily.   escitalopram (LEXAPRO) 10 MG tablet TAKE 1 TABLET BY MOUTH DAILY (FOR MOOD/DEPRESSION)   famotidine (PEPCID) 20 MG tablet Take by mouth.   levothyroxine (SYNTHROID, LEVOTHROID) 75 MCG tablet    Multiple Vitamin (MULTIVITAMIN) capsule Take by mouth.   mupirocin ointment (BACTROBAN) 2 % Apply 1 application topically 2 (two) times daily.   pravastatin (PRAVACHOL) 40 MG tablet TAKE ONE TABLET EACH EVENING   trospium (SANCTURA) 20 MG tablet 1 tablet 1 hour prior to bedtime   No facility-administered encounter medications on file as of 03/26/2021.    Allergies (verified) Penicillins, Ceftin [cefuroxime axetil], and Ramipril   History: Past Medical History:   Diagnosis Date   Asymptomatic menopausal state 02/20/1984   Overview:  Naturally occuring and asymptomatic Vaginal atrophy present on topical estrogen Overview:  Overview:  Naturally occuring and asymptomatic Vaginal atrophy present on topical estrogen   Basal cell carcinoma of skin 02/19/2013   Overview:  Basal cell on her nasolabial fold 2014 S/p Mohs by Dr. CLacinda AxonFollowed by local dermatologist regularly Overview:  Overview:  Followed by Dr. CLacinda Axon  Cyst of ovary 02/19/2009   Overview:  Ovarian cyst on right stable 2004-2010 Cyst ? larger on CT 2012 Stable on follow [place] SEdwinna AreolaOverview:  Overview:  Ovarian cyst on right stable 2004-2010 Cyst ? larger on CT 2012 Stable on follow [place]. SEdwinna Areola  Depression, major, single episode, complete remission ([place] 07/18/2017   Diffuse cystic mastopathy of breast 02/19/2013   Overview:  Mother with breast cancer later in life Two breast biopsies both with normal pathology Annual mammograms recommended, [facility]  Relevant Hx: Course: Daily Update: Today's Plan: Overview:  Overview:  Mother with breast cancer later in life Two breast biopsies both with normal pathology Annual mammograms recommended, last 201   Essential (primary) hypertension 02/19/2013   Overview:  Goal blood pressure < 135/85 On atenolol Overview:  Overview:  Goal blood pressure < 135/85 On atenolol   Hyperlipidemia 02/19/2013   Overview:  Goal LDL < 130, last value 92 in 2017 On Pravachol Overview:  Overview:  Goal LDL < 130, last value 82 in 2012 On Pravachol   Hypothyroidism 02/28/2012   Overview:  started on T4  TFT normal 2017 Followed by Dr. Almon Hercules Overview:  Overview:  started on T4  TFT normal 2014 Followed by Dr. Almon Hercules   Impaired glucose tolerance 02/20/2003   Overview:  Overview:  Glucose goal < 100, last value 94 in 2014 HgbA1c goal < 6.0, last value 5.3 in 2014 Urine microalbumin  negative in 2014 On diet and exercise   Nontoxic multinodular goiter 03/06/2003   Overview:  Asymptomatic nodular thyroid Stable [place] 2004-2014 Followed by Dr. Almon Hercules Overview:  Overview:  Asymptomatic nodular thyroid Stable [place] 2004-2014 Followed by Dr. Almon Hercules   OSA on CPAP 01/13/2015   Overview:  Sleep study sent to medical records:   Sleep efficiency 22%, latency 24 min, # awakenings 11, res desat N=10, index 17.8  Apnea 1 for 16 sec, hypopnea 16 for 18 sec, total sleep time only 90 min so index = 11 She did not sleep much and the index puts her in mild category Repeat study a few days later when she slept 187 min her AHI = 4.6 which is normal, average O2 sat 94% I don't think    Osteoarthritis 02/20/2003   Overview:  Mild bilateral hands Right CMC Right hip Left knee s/p meniscus tear All symptomatically stable Overview:  Overview:  Mild bilateral hands Right CMC Right hip Left knee s/p meniscus tear All symptomatically stable   Osteoporosis 02/20/2003   Overview:  Bone density 2004 LS -3.32 and RH-1.62 Bone density 2008 LS -3.14 and RH -1.22 Bone density 2009 LS -3.17 and RH -1.21 Bone density 2010 LS -3.14 and RH -1.42  Bone density 2014 LS -2.7 (L3 -3.1) and RH -1.5 Bone density 2017 LS -2.3 and RH -1.4 (neck -1.7) Vitamin D level 38 in 2012 On Boniva 2004 to 2010, restarted 2015 Repeat bone density in 2020 and stop Boniva  Last Assessment & Pl   Prediabetes 02/20/2003   Overview:  Glucose goal < 100, last value 91 in 2017 HgbA1c goal < 6.0, last value 5.6 in 2017 Urine microalbumin negative in 2017 On diet and exercise   Recurrent UTI 10/19/2014   Squamous cell carcinoma of skin 03/14/2019   Left mid brow. SCCis, hypertrophic. EDC   Past Surgical History:  Procedure Laterality Date   ABDOMINAL HYSTERECTOMY  1975   MASTECTOMY Left    URETEROSCOPY     URETEROSCOPY WITH HOLMIUM LASER LITHOTRIPSY     Family History  Problem Relation Age of Onset   Breast cancer Mother    Skin cancer Father     Social History   Socioeconomic History   Marital status: Widowed    Spouse name: Not on file   Number of children: Not on file   Years of education: Not on file   Highest education level: Not on file  Occupational History   Not on file  Tobacco Use   Smoking status: Never   Smokeless tobacco: Never  Vaping Use   Vaping Use: Never used  Substance and Sexual Activity   Alcohol use: No   Drug use: No   Sexual activity: Not Currently    Birth control/protection: Post-menopausal  Other Topics Concern   Not on file  Social History Narrative   Not on file   Social Determinants of Health   Financial Resource Strain: Low Risk    Difficulty of Paying Living Expenses: Not hard at all  Food Insecurity:  No Food Insecurity   Worried About Charity fundraiser in the Last Year: Never true   Ran Out of Food in the Last Year: Never true  Transportation Needs: No Transportation Needs   Lack of Transportation (Medical): No   Lack of Transportation (Non-Medical): No  Physical Activity: Sufficiently Active   Days of Exercise per Week: 5 days   Minutes of Exercise per Session: 30 min  Stress: No Stress Concern Present   Feeling of Stress : Not at all  Social Connections: Unknown   Frequency of Communication with Friends and Family: More than three times a week   Frequency of Social Gatherings with Friends and Family: More than three times a week   Attends Religious Services: Not on file   Active Member of Clubs or Organizations: Not on file   Attends Archivist Meetings: Not on file   Marital Status: Widowed    Tobacco Counseling Counseling given: Not Answered   Clinical Intake:  Pre-visit preparation completed: Yes        Diabetes: No  How often do you need to have someone help you when you read instructions, pamphlets, or other written materials from your doctor or pharmacy?: 1 - Never   Interpreter Needed?: No      Activities of Daily Living In your  present state of health, do you have any difficulty performing the following activities: 03/26/2021  Hearing? Y  Vision? N  Difficulty concentrating or making decisions? N  Walking or climbing stairs? N  Dressing or bathing? N  Doing errands, shopping? N  Preparing Food and eating ? N  Using the Toilet? N  In the past six months, have you accidently leaked urine? Y  Do you have problems with loss of bowel control? N  Managing your Medications? N  Managing your Finances? N  Housekeeping or managing your Housekeeping? N  Some recent data might be hidden    Patient Care Team: Einar Pheasant, MD as PCP - General (Internal Medicine)  Indicate any recent Medical Services you may have received from other than [facility] providers in the past year (date may be approximate).     Assessment:   This is a routine wellness examination for [place].  I connected with Ezella today by telephone and verified that I am speaking with the correct person using two identifiers. Location patient: home Location provider: work Persons participating in the virtual visit: patient, Marine scientist.    I discussed the limitations, risks, security and privacy concerns of performing an evaluation and management service by telephone and the availability of in person appointments. The patient expressed understanding and verbally consented to this telephonic visit.    Interactive audio and video telecommunications were attempted between this provider and patient, however failed, due to patient having technical difficulties OR patient did not have access to video capability.  We continued and completed visit with audio only.  Some vital signs may be absent or patient reported.   Hearing/Vision screen Hearing Screening - Comments:: Hearing aids Vision Screening - Comments:: Followed by [facility]  Wears corrective lenses  Cataract extraction, bilateral  They have seen their ophthalmologist in the last 12 months.    Dietary issues and exercise activities discussed: Current Exercise Habits: Home exercise routine, Intensity: Mild Regular diet Good water intake   Goals Addressed             This Visit's Progress    Maintain Healthy Lifestyle       Stay  active  Stay hydrated Healthy diet       Depression Screen PHQ 2/9 Scores 03/26/2021 10/15/2020 07/16/2020 03/25/2020 02/10/2020 10/10/2019  PHQ - 2 Score 0 2 3 0 0 0  PHQ- 9 Score - 7 9 0 0 0    Fall Risk Fall Risk  03/26/2021 03/25/2020 09/04/2019  Falls in the past year? 0 0 1  Number falls in past yr: - 0 0  Injury with Fall? - - 0  Follow up Falls evaluation completed Falls evaluation completed Falls evaluation completed    [place]: Adequate lighting in your home to reduce risk of falls? Yes   ASSISTIVE DEVICES UTILIZED TO PREVENT FALLS: Life alert? Yes  Use of a cane, walker or w/c? No   TIMED UP AND GO: Was the test performed? No .   Cognitive Function:  Patient is alert and oriented x3.    6CIT Screen 03/26/2021 03/25/2020  What Year? 0 points 0 points  What month? 0 points 0 points  What time? 0 points 0 points  Months in reverse - 0 points  Repeat phrase - 0 points    Immunizations Immunization History  Administered Date(s) Administered   Fluad Quad(high Dose 65+) 05/20/2019   Influenza-Unspecified 05/28/2015, 05/15/2016, 05/09/2017, 06/30/2020   PFIZER(Purple Top)SARS-COV-2 Vaccination 08/21/2019, 09/11/2019, 06/08/2020   Pneumococcal Conjugate-13 04/16/2014   Pneumococcal Polysaccharide-23 08/15/2009   Tdap 08/15/2010   Zoster Recombinat (Shingrix) 02/28/2019, 07/16/2019   Zoster, Live 02/27/2010    TDAP status: Due, Education has been provided regarding the importance of this vaccine. Advised may receive this vaccine at local pharmacy or Health Dept. Aware to provide a copy of the vaccination record if obtained from local pharmacy or Health Dept. Verbalized acceptance and  understanding. Deferred.   Health Maintenance Health Maintenance  Topic Date Due   COVID-19 Vaccine (4 - Booster for Pfizer series) 04/11/2021 (Originally 09/08/2020)   INFLUENZA VACCINE  05/12/2021 (Originally 03/15/2021)   TETANUS/TDAP  03/26/2022 (Originally 08/15/2020)   DEXA SCAN  Completed   PNA vac Low Risk Adult  Completed   Zoster Vaccines- Shingrix  Completed   HPV VACCINES  Aged Out   Lung Cancer Screening: (Low Dose CT Chest recommended if Age 29-80 years, 30 pack-year currently smoking OR have quit w/in 15years.) does not qualify.   Hepatitis C Screening: does not qualify  Vision Screening: Recommended annual ophthalmology exams for early detection of glaucoma and other disorders of the eye.  Dental Screening: Recommended annual dental exams for proper oral hygiene  Community Resource Referral / Chronic Care Management: CRR required this visit?  No   CCM required this visit?  No      Plan:   Keep all routine maintenance appointments.   I have personally reviewed and noted the following in the patient's chart:   Medical and social history Use of alcohol, tobacco or illicit drugs  Current medications and supplements including opioid prescriptions. Not taking any opioid.  Functional ability and status Nutritional status Physical activity Advanced directives List of other physicians Hospitalizations, surgeries, and ER visits in previous 12 months Vitals Screenings to include cognitive, depression, and falls Referrals and appointments  In addition, I have reviewed and discussed with patient certain preventive protocols, quality metrics, and best practice recommendations. A written personalized care plan for preventive services as well as general preventive health recommendations were provided to patient via mychart.     Varney Biles, LPN   [id number]

## 2021-03-26 NOTE — Patient Instructions (Addendum)
  Jennifer Bernard , Thank you for taking time to come for your Medicare Wellness Visit. I appreciate your ongoing commitment to your health goals. Please review the following plan we discussed and let me know if I can assist you in the future.   These are the goals we discussed:  Goals      Maintain Healthy Lifestyle     Stay active  Stay hydrated Healthy diet        This is a list of the screening recommended for you and due dates:  Health Maintenance  Topic Date Due   COVID-19 Vaccine (4 - Booster for Pfizer series) 04/11/2021*   Flu Shot  05/12/2021*   Tetanus Vaccine  03/26/2022*   DEXA scan (bone density measurement)  Completed   Pneumonia vaccines  Completed   Zoster (Shingles) Vaccine  Completed   HPV Vaccine  Aged Out  *Topic was postponed. The date shown is not the original due date.

## 2021-04-01 ENCOUNTER — Telehealth: Payer: Self-pay | Admitting: Family Medicine

## 2021-04-01 NOTE — Telephone Encounter (Signed)
Patient notified her insurance company has denied the PA for Trospium. Patient states she hasn't been taking this medication and will not start it again unless she starts having more frequency at night. I informed her that if she decides she wants to start the medication I can send it to a different pharmacy that excepts [facility] to get it at a discounted price.

## 2021-04-06 ENCOUNTER — Ambulatory Visit (INDEPENDENT_AMBULATORY_CARE_PROVIDER_SITE_OTHER): Payer: Medicare HMO | Admitting: Internal Medicine

## 2021-04-06 ENCOUNTER — Other Ambulatory Visit: Payer: Self-pay

## 2021-04-06 VITALS — BP 128/80 | HR 74 | Temp 97.8°F | Resp 16 | Ht 62.0 in | Wt 142.0 lb

## 2021-04-06 DIAGNOSIS — R739 Hyperglycemia, unspecified: Secondary | ICD-10-CM | POA: Diagnosis not present

## 2021-04-06 DIAGNOSIS — Z853 Personal history of malignant neoplasm of breast: Secondary | ICD-10-CM

## 2021-04-06 DIAGNOSIS — E039 Hypothyroidism, unspecified: Secondary | ICD-10-CM

## 2021-04-06 DIAGNOSIS — I10 Essential (primary) hypertension: Secondary | ICD-10-CM

## 2021-04-06 DIAGNOSIS — F325 Major depressive disorder, single episode, in full remission: Secondary | ICD-10-CM

## 2021-04-06 DIAGNOSIS — R5383 Other fatigue: Secondary | ICD-10-CM | POA: Diagnosis not present

## 2021-04-06 DIAGNOSIS — K219 Gastro-esophageal reflux disease without esophagitis: Secondary | ICD-10-CM | POA: Diagnosis not present

## 2021-04-06 DIAGNOSIS — R351 Nocturia: Secondary | ICD-10-CM | POA: Diagnosis not present

## 2021-04-06 DIAGNOSIS — E785 Hyperlipidemia, unspecified: Secondary | ICD-10-CM | POA: Diagnosis not present

## 2021-04-06 LAB — BASIC METABOLIC PANEL
BUN: 16 mg/dL (ref 6–23)
CO2: 27 mEq/L (ref 19–32)
Calcium: 9.6 mg/dL (ref 8.4–10.5)
Chloride: 103 mEq/L (ref 96–112)
Creatinine, Ser: 0.76 mg/dL (ref 0.40–1.20)
GFR: 68.21 mL/min (ref >60.00)
Glucose, Bld: 83 mg/dL (ref 70–99)
Potassium: 4 mEq/L (ref 3.5–5.1)
Sodium: 140 mEq/L (ref 135–145)

## 2021-04-06 LAB — CBC WITH DIFFERENTIAL/PLATELET
Basophils Absolute: 0.1 10*3/uL (ref 0.0–0.1)
Basophils Relative: 1.3 % (ref 0.0–3.0)
Eosinophils Absolute: 0 10*3/uL (ref 0.0–0.7)
Eosinophils Relative: 0.6 % (ref 0.0–5.0)
HCT: 44.2 % (ref 36.0–46.0)
Hemoglobin: 14.7 g/dL (ref 12.0–15.0)
Lymphocytes Relative: 21.8 % (ref 12.0–46.0)
Lymphs Abs: 1.6 10*3/uL (ref 0.7–4.0)
MCHC: 33.3 g/dL (ref 30.0–36.0)
MCV: 90.1 fl (ref 78.0–100.0)
Monocytes Absolute: 0.8 10*3/uL (ref 0.1–1.0)
Monocytes Relative: 11.7 % (ref 3.0–12.0)
Neutro Abs: 4.7 10*3/uL (ref 1.4–7.7)
Neutrophils Relative %: 64.6 % (ref 43.0–77.0)
Platelets: 226 10*3/uL (ref 150.0–400.0)
RBC: 4.9 Mil/uL (ref 3.87–5.11)
RDW: 14.1 % (ref 11.5–15.5)
WBC: 7.2 10*3/uL (ref 4.0–10.5)

## 2021-04-06 LAB — HEPATIC FUNCTION PANEL
ALT: 16 U/L (ref 0–35)
AST: 21 U/L (ref 0–37)
Albumin: 4.1 g/dL (ref 3.5–5.2)
Alkaline Phosphatase: 58 U/L (ref 39–117)
Bilirubin, Direct: 0.2 mg/dL (ref 0.0–0.3)
Total Bilirubin: 0.8 mg/dL (ref 0.2–1.2)
Total Protein: 6.6 g/dL (ref 6.0–8.3)

## 2021-04-06 LAB — TSH: TSH: 1.55 u[IU]/mL (ref 0.35–5.50)

## 2021-04-06 LAB — VITAMIN B12: Vitamin B-12: 372 pg/mL (ref 211–911)

## 2021-04-06 LAB — HEMOGLOBIN A1C: Hgb A1c MFr Bld: 5.5 % (ref 4.6–6.5)

## 2021-04-06 NOTE — Patient Instructions (Signed)
Pepcid 20 (famotidine) - take one tablet 30 minutes before your evening meal.

## 2021-04-06 NOTE — Progress Notes (Signed)
Patient ID: Jennifer Bernard, female   DOB: 1927-03-30, 87 y.o.   MRN: [id number]   Subjective:    Patient ID: Jennifer Bernard, female    DOB: Sep 04, 1926, 87 y.o.   MRN: [id number]  HPI This visit occurred during the SARS-CoV-2 public health emergency.  Safety protocols were in place, including screening questions prior to the visit, additional usage of staff PPE, and extensive cleaning of exam room while observing appropriate contact time as indicated for disinfecting solutions.   Patient here for a scheduled follow up.  Here to follow up regarding recent diagnosis of breast cancer.  Also here to follow up regarding her blood pressure, thyroid and cholesterol.  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 Had f/u with Dr Janese Banks.  Recommended arimidex.  She has not started - stating she is 29 and not sure that she wants to take the medication.  Discussed with her - informed her to discuss with oncologist as well.  Did well with surgery.  Breathing stable.  No increased cough or congestion.  No chest pain or sob reported.  No abdominal pain. Does report some increased acid reflux.  Increased fatigue.  Bowels moving.  Seeing urology.   .  Past Medical History:  Diagnosis Date   Asymptomatic menopausal state 02/20/1984   Overview:  Naturally occuring and asymptomatic Vaginal atrophy present on topical estrogen Overview:  Overview:  Naturally occuring and asymptomatic Vaginal atrophy present on topical estrogen   Basal cell carcinoma of skin 02/19/2013   Overview:  Basal cell on her nasolabial fold 2014 S/p Mohs by Dr. Lacinda Axon Followed by local dermatologist regularly Overview:  Overview:  Followed by Dr. Lacinda Axon   Cyst of ovary 02/19/2009   Overview:  Ovarian cyst on right stable 2004-2010 Cyst ? larger on CT 2012 Stable on follow up US Followed by Dr. Edwinna Areola Overview:  Overview:  Ovarian cyst on right stable 2004-2010 Cyst ?  larger on CT 2012 Stable on follow up US Followed by Dr. Edwinna Areola   Depression, major, single episode, complete remission ([place]) 07/18/2017   Diffuse cystic mastopathy of breast 02/19/2013   Overview:  Mother with breast cancer later in life Two breast biopsies both with normal pathology Annual mammograms recommended, last 2016 Followed at the [facility]:  Relevant Hx: Course: Daily Update: Today's Plan: Overview:  Overview:  Mother with breast cancer later in life Two breast biopsies both with normal pathology Annual mammograms recommended, last 201   Essential (primary) hypertension 02/19/2013   Overview:  Goal blood pressure < 135/85 On atenolol Overview:  Overview:  Goal blood pressure < 135/85 On atenolol   Hyperlipidemia 02/19/2013   Overview:  Goal LDL < 130, last value 92 in 2017 On Pravachol Overview:  Overview:  Goal LDL < 130, last value 82 in 2012 On Pravachol   Hypothyroidism 02/28/2012   Overview:  started on T4  TFT normal 2017 Followed by Dr. Almon Hercules Overview:  Overview:  started on T4  TFT normal 2014 Followed by Dr. Almon Hercules   Impaired glucose tolerance 02/20/2003   Overview:  Overview:  Glucose goal < 100, last value 94 in 2014 HgbA1c goal < 6.0, last value 5.3 in 2014 Urine microalbumin negative in 2014 On diet and exercise   Nontoxic multinodular goiter 03/06/2003   Overview:  Asymptomatic nodular thyroid Stable [place] 2004-2014 Followed by Dr. Almon Hercules Overview:  Overview:  Asymptomatic nodular  thyroid Stable [place] 2004-2014 Followed by Dr. Almon Hercules   OSA on CPAP 01/13/2015   Overview:  Sleep study sent to medical records:   Sleep efficiency 22%, latency 24 min, # awakenings 11, res desat N=10, index 17.8  Apnea 1 for 16 sec, hypopnea 16 for 18 sec, total sleep time only 90 min so index = 11 She did not sleep much and the index puts her in mild category Repeat study a few days later when she slept 187 min her AHI = 4.6 which is normal, average O2 sat 94% I  don't think    Osteoarthritis 02/20/2003   Overview:  Mild bilateral hands Right CMC Right hip Left knee s/p meniscus tear All symptomatically stable Overview:  Overview:  Mild bilateral hands Right CMC Right hip Left knee s/p meniscus tear All symptomatically stable   Osteoporosis 02/20/2003   Overview:  Bone density 2004 LS -3.32 and RH-1.62 Bone density 2008 LS -3.14 and RH -1.22 Bone density 2009 LS -3.17 and RH -1.21 Bone density 2010 LS -3.14 and RH -1.42  Bone density 2014 LS -2.7 (L3 -3.1) and RH -1.5 Bone density 2017 LS -2.3 and RH -1.4 (neck -1.7) Vitamin D level 38 in 2012 On Boniva 2004 to 2010, restarted 2015 Repeat bone density in 2020 and stop Boniva  Last Assessment & Pl   Prediabetes 02/20/2003   Overview:  Glucose goal < 100, last value 91 in 2017 HgbA1c goal < 6.0, last value 5.6 in 2017 Urine microalbumin negative in 2017 On diet and exercise   Recurrent UTI 10/19/2014   Squamous cell carcinoma of skin 03/14/2019   Left mid brow. SCCis, hypertrophic. EDC   Past Surgical History:  Procedure Laterality Date   ABDOMINAL HYSTERECTOMY  1975   MASTECTOMY Left    URETEROSCOPY     URETEROSCOPY WITH HOLMIUM LASER LITHOTRIPSY     Family History  Problem Relation Age of Onset   Breast cancer Mother    Skin cancer Father    Social History   Socioeconomic History   Marital status: Widowed    Spouse name: Not on file   Number of children: Not on file   Years of education: Not on file   Highest education level: Not on file  Occupational History   Not on file  Tobacco Use   Smoking status: Never   Smokeless tobacco: Never  Vaping Use   Vaping Use: Never used  Substance and Sexual Activity   Alcohol use: No   Drug use: No   Sexual activity: Not Currently    Birth control/protection: Post-menopausal  Other Topics Concern   Not on file  Social History Narrative   Not on file   Social Determinants of Health   Financial Resource Strain: Low Risk    Difficulty of Paying  Living Expenses: Not hard at all  Food Insecurity: No Food Insecurity   Worried About Charity fundraiser in the Last Year: Never true   Miner in the Last Year: Never true  Transportation Needs: No Transportation Needs   Lack of Transportation (Medical): No   Lack of Transportation (Non-Medical): No  Physical Activity: Sufficiently Active   Days of Exercise per Week: 5 days   Minutes of Exercise per Session: 30 min  Stress: No Stress Concern Present   Feeling of Stress : Not at all  Social Connections: Unknown   Frequency of Communication with Friends and Family: More than three times a week   Frequency of  Social Gatherings with Friends and Family: More than three times a week   Attends Religious Services: Not on file   Active Member of Clubs or Organizations: Not on file   Attends Archivist Meetings: Not on file   Marital Status: Widowed    Review of Systems  Constitutional:  Positive for fatigue. Negative for appetite change and unexpected weight change.  HENT:  Negative for congestion and sinus pressure.   Respiratory:  Negative for cough, chest tightness and shortness of breath.   Cardiovascular:  Negative for chest pain, palpitations and leg swelling.  Gastrointestinal:  Negative for abdominal pain, diarrhea, nausea and vomiting.  Genitourinary:  Negative for difficulty urinating and dysuria.  Musculoskeletal:  Negative for joint swelling and myalgias.  Skin:  Negative for color change and rash.  Neurological:  Negative for dizziness, light-headedness and headaches.  Psychiatric/Behavioral:  Negative for agitation and dysphoric mood.       Objective:    Physical Exam Vitals reviewed.  Constitutional:      General: She is not in acute distress.    Appearance: Normal appearance.  HENT:     Head: Normocephalic and atraumatic.     Right Ear: External ear normal.     Left Ear: External ear normal.  Eyes:     General: No scleral icterus.       Right  eye: No discharge.        Left eye: No discharge.     Conjunctiva/sclera: Conjunctivae normal.  Neck:     Thyroid: No thyromegaly.  Cardiovascular:     Rate and Rhythm: Normal rate and regular rhythm.  Pulmonary:     Effort: No respiratory distress.     Breath sounds: Normal breath sounds. No wheezing.  Abdominal:     General: Bowel sounds are normal.     Palpations: Abdomen is soft.     Tenderness: There is no abdominal tenderness.  Musculoskeletal:        General: No swelling or tenderness.     Cervical back: Neck supple. No tenderness.  Lymphadenopathy:     Cervical: No cervical adenopathy.  Skin:    Findings: No erythema or rash.  Neurological:     Mental Status: She is alert.  Psychiatric:        Mood and Affect: Mood normal.        Behavior: Behavior normal.    BP 128/80   Pulse 74   Temp 97.8 F (36.6 C)   Resp 16   Ht '5\' 2"'  (1.575 m)   Wt 142 lb (64.4 kg)   SpO2 97%   BMI 25.97 kg/m  Wt Readings from Last 3 Encounters:  04/06/21 142 lb (64.4 kg)  03/26/21 140 lb (63.5 kg)  03/25/21 140 lb (63.5 kg)    Outpatient Encounter Medications as of 04/06/2021  Medication Sig   amLODipine (NORVASC) 5 MG tablet TAKE 1 TABLET EVERY DAY   anastrozole (ARIMIDEX) 1 MG tablet Take 1 mg by mouth daily.   escitalopram (LEXAPRO) 10 MG tablet TAKE 1 TABLET BY MOUTH DAILY (FOR MOOD/DEPRESSION)   famotidine (PEPCID) 20 MG tablet Take by mouth.   levothyroxine (SYNTHROID, LEVOTHROID) 75 MCG tablet    Multiple Vitamin (MULTIVITAMIN) capsule Take by mouth.   mupirocin ointment (BACTROBAN) 2 % Apply 1 application topically 2 (two) times daily.   pravastatin (PRAVACHOL) 40 MG tablet TAKE ONE TABLET EACH EVENING   [DISCONTINUED] trospium (SANCTURA) 20 MG tablet 1 tablet 1 hour prior to bedtime  No facility-administered encounter medications on file as of 04/06/2021.     Lab Results  Component Value Date   WBC 7.2 04/06/2021   HGB 14.7 04/06/2021   HCT 44.2 04/06/2021   PLT  226.0 04/06/2021   GLUCOSE 83 04/06/2021   CHOL 183 01/13/2021   TRIG 142.0 01/13/2021   HDL 65.80 01/13/2021   LDLCALC 89 01/13/2021   ALT 16 04/06/2021   AST 21 04/06/2021   NA 140 04/06/2021   K 4.0 04/06/2021   CL 103 04/06/2021   CREATININE 0.76 04/06/2021   BUN 16 04/06/2021   CO2 27 04/06/2021   TSH 1.55 04/06/2021   HGBA1C 5.5 04/06/2021    US BREAST LTD UNI LEFT INC AXILLA  Result Date: 11/24/2020 CLINICAL DATA:  87 year old female presenting as a recall from screening for possible left breast distortion. EXAM: DIGITAL DIAGNOSTIC UNILATERAL LEFT MAMMOGRAM WITH TOMOSYNTHESIS AND CAD; ULTRASOUND LEFT BREAST LIMITED TECHNIQUE: Left digital diagnostic mammography and breast tomosynthesis was performed. The images were evaluated with computer-aided detection.; Targeted ultrasound examination of the left breast was performed COMPARISON:  Previous exam(s). ACR Breast Density Category b: There are scattered areas of fibroglandular density. FINDINGS: Mammogram: Spot compression tomosynthesis and full field mL tomosynthesis views of the left breast were performed. There is a persistent mass with distortion measuring approximately 1.2 cm in the retroareolar left breast. There appears to be extension to the nipple base. On physical exam I feel a discrete thickening in the retroareolar left breast. Ultrasound: Targeted ultrasound performed in the retroareolar left breast demonstrating an irregular hypoechoic mass measuring 1.6 x 1.5 x 1.4 cm. The mass extends to the base of the nipple. This corresponds to the mammographic finding. Targeted ultrasound of the left axilla demonstrates normal lymph nodes. IMPRESSION: Suspicious mass with distortion in the retroareolar left breast measuring 1.6 cm. RECOMMENDATION: Ultrasound-guided core needle biopsy of the left breast mass. I have discussed the findings and recommendations with the patient who agrees to proceed with biopsy. The patient will be contacted  by our scheduler to arrange the biopsy appointment. BI-RADS CATEGORY  5: Highly suggestive of malignancy. Electronically Signed   By: Audie Pinto M.D.   On: 11/24/2020 14:38   MM DIAG BREAST TOMO UNI LEFT  Result Date: 11/24/2020 CLINICAL DATA:  87 year old female presenting as a recall from screening for possible left breast distortion. EXAM: DIGITAL DIAGNOSTIC UNILATERAL LEFT MAMMOGRAM WITH TOMOSYNTHESIS AND CAD; ULTRASOUND LEFT BREAST LIMITED TECHNIQUE: Left digital diagnostic mammography and breast tomosynthesis was performed. The images were evaluated with computer-aided detection.; Targeted ultrasound examination of the left breast was performed COMPARISON:  Previous exam(s). ACR Breast Density Category b: There are scattered areas of fibroglandular density. FINDINGS: Mammogram: Spot compression tomosynthesis and full field mL tomosynthesis views of the left breast were performed. There is a persistent mass with distortion measuring approximately 1.2 cm in the retroareolar left breast. There appears to be extension to the nipple base. On physical exam I feel a discrete thickening in the retroareolar left breast. Ultrasound: Targeted ultrasound performed in the retroareolar left breast demonstrating an irregular hypoechoic mass measuring 1.6 x 1.5 x 1.4 cm. The mass extends to the base of the nipple. This corresponds to the mammographic finding. Targeted ultrasound of the left axilla demonstrates normal lymph nodes. IMPRESSION: Suspicious mass with distortion in the retroareolar left breast measuring 1.6 cm. RECOMMENDATION: Ultrasound-guided core needle biopsy of the left breast mass. I have discussed the findings and recommendations with the patient who agrees to proceed with  biopsy. The patient will be contacted by our scheduler to arrange the biopsy appointment. BI-RADS CATEGORY  5: Highly suggestive of malignancy. Electronically Signed   By: Audie Pinto M.D.   On: 11/24/2020 14:38        Assessment & Plan:   Problem List Items Addressed This Visit     Acid reflux    Restart pepcid.  Call with update.  Notify if persistent symptoms.  Avoid eating late.  Avoid foods that aggravate.       Depression, major, single episode, complete remission ([place])    Continue on lexapro.  Stable.  Follow.       Essential (primary) hypertension    On amlodipine.  Blood pressure doing well.  Follow pressures.  Follow metabolic panel.       Relevant Orders   CBC with Differential/Platelet (Completed)   Basic metabolic panel (Completed)   Fatigue   Relevant Orders   Vitamin B12 (Completed)   History of breast cancer    S/p recent mastectomy.  Prescribed arimidex.  Desires not to take. Plans to discuss with oncology.        Hyperglycemia    Follow met b and a1c.       Relevant Orders   Hemoglobin A1c (Completed)   Hyperlipidemia    On pravastatin.  Low cholesterol diet and exercise.  Follow lipid panel and liver function tests.        Relevant Orders   Hepatic function panel (Completed)   Hypothyroidism - Primary    On thyroid replacement.  Follow tsh.       Relevant Orders   TSH (Completed)   Nocturia    Not as much of an issue now.  Seeing urology.  Restart trospiium.  Follow.         Einar Pheasant, MD

## 2021-04-09 ENCOUNTER — Other Ambulatory Visit: Payer: Self-pay

## 2021-04-09 MED ORDER — VITAMIN B-12 1000 MCG PO TABS: 1000.0000 ug | ORAL_TABLET | Freq: Every day | ORAL | 3 refills | Status: DC

## 2021-04-11 ENCOUNTER — Encounter: Payer: Self-pay | Admitting: Internal Medicine

## 2021-04-11 DIAGNOSIS — Z853 Personal history of malignant neoplasm of breast: Secondary | ICD-10-CM | POA: Insufficient documentation

## 2021-04-11 NOTE — Assessment & Plan Note (Signed)
On thyroid replacement.  Follow tsh.  

## 2021-04-11 NOTE — Assessment & Plan Note (Signed)
On pravastatin.  Low cholesterol diet and exercise.  Follow lipid panel and liver function tests.   

## 2021-04-11 NOTE — Assessment & Plan Note (Signed)
On amlodipine.  Blood pressure doing well.  Follow pressures.  Follow metabolic panel.

## 2021-04-11 NOTE — Assessment & Plan Note (Signed)
S/p recent mastectomy.  Prescribed arimidex.  Desires not to take. Plans to discuss with oncology.

## 2021-04-11 NOTE — Assessment & Plan Note (Signed)
Continue on lexapro.  Stable.  Follow.

## 2021-04-11 NOTE — Assessment & Plan Note (Signed)
Restart pepcid.  Call with update.  Notify if persistent symptoms.  Avoid eating late.  Avoid foods that aggravate.

## 2021-04-11 NOTE — Assessment & Plan Note (Signed)
Not as much of an issue now.  Seeing urology.  Restart trospiium.  Follow.

## 2021-04-11 NOTE — Assessment & Plan Note (Signed)
Follow met b and a1c.  

## 2021-04-15 DIAGNOSIS — H6123 Impacted cerumen, bilateral: Secondary | ICD-10-CM | POA: Diagnosis not present

## 2021-04-15 DIAGNOSIS — H903 Sensorineural hearing loss, bilateral: Secondary | ICD-10-CM | POA: Diagnosis not present

## 2021-04-20 ENCOUNTER — Other Ambulatory Visit: Payer: Self-pay | Admitting: Internal Medicine

## 2021-05-05 ENCOUNTER — Other Ambulatory Visit (INDEPENDENT_AMBULATORY_CARE_PROVIDER_SITE_OTHER): Payer: Medicare HMO

## 2021-05-05 ENCOUNTER — Telehealth: Payer: Self-pay | Admitting: Internal Medicine

## 2021-05-05 DIAGNOSIS — R350 Frequency of micturition: Secondary | ICD-10-CM

## 2021-05-05 NOTE — Telephone Encounter (Signed)
Spoke with patient. She is complaining of urinary frequency, more so at night. Denies any other symptoms. She was started on trospium by urology- is not taking. She says she could not really tell that it made a difference. I have sent home urine cup with daughter for patient to collect specimen. Someone will bring back to our office. Patient has been scheduled for VV with Dr Nicki Reaper tomorrow at 4pm. Urine orders placed.

## 2021-05-05 NOTE — Telephone Encounter (Signed)
Patient called and wanted Dr Bary Leriche advice. She is having frequent urination and wanted to know it she should be seen.

## 2021-05-06 ENCOUNTER — Other Ambulatory Visit: Payer: Self-pay

## 2021-05-06 ENCOUNTER — Ambulatory Visit (INDEPENDENT_AMBULATORY_CARE_PROVIDER_SITE_OTHER): Payer: Medicare HMO | Admitting: Internal Medicine

## 2021-05-06 DIAGNOSIS — I10 Essential (primary) hypertension: Secondary | ICD-10-CM

## 2021-05-06 DIAGNOSIS — R351 Nocturia: Secondary | ICD-10-CM | POA: Diagnosis not present

## 2021-05-06 LAB — URINALYSIS, ROUTINE W REFLEX MICROSCOPIC
Bilirubin Urine: NEGATIVE
Ketones, ur: NEGATIVE
Nitrite: NEGATIVE
Specific Gravity, Urine: 1.02 (ref 1.000–1.030)
Total Protein, Urine: 100 — AB
Urine Glucose: NEGATIVE
Urobilinogen, UA: 0.2 — AB (ref 0.0–1.0)
pH: 6 (ref 5.0–8.0)

## 2021-05-06 NOTE — Progress Notes (Deleted)
Patient ID: Jennifer Bernard, female   DOB: 10-01-1926, 87 y.o.   MRN: [id number]   Subjective:    Patient ID: Jennifer Bernard, female    DOB: August 15, 1927, 87 y.o.   MRN: [id number]  This visit occurred during the SARS-CoV-2 public health emergency.  Safety protocols were in place, including screening questions prior to the visit, additional usage of staff PPE, and extensive cleaning of exam room while observing appropriate contact time as indicated for disinfecting solutions.   Patient here for  Chief Complaint  Patient presents with   Acute Home Visit    Urine urgency and frequency   .   HPI    Past Medical History:  Diagnosis Date   Asymptomatic menopausal state 02/20/1984   Overview:  Naturally occuring and asymptomatic Vaginal atrophy present on topical estrogen Overview:  Overview:  Naturally occuring and asymptomatic Vaginal atrophy present on topical estrogen   Basal cell carcinoma of skin 02/19/2013   Overview:  Basal cell on her nasolabial fold 2014 S/p Mohs by Dr. Lacinda Axon Followed by local dermatologist regularly Overview:  Overview:  Followed by Dr. Lacinda Axon   Cyst of ovary 02/19/2009   Overview:  Ovarian cyst on right stable 2004-2010 Cyst ? larger on CT 2012 Stable on follow up US Followed by Dr. Edwinna Areola Overview:  Overview:  Ovarian cyst on right stable 2004-2010 Cyst ? larger on CT 2012 Stable on follow up US Followed by Dr. Edwinna Areola   Depression, major, single episode, complete remission ([place]) 07/18/2017   Diffuse cystic mastopathy of breast 02/19/2013   Overview:  Mother with breast cancer later in life Two breast biopsies both with normal pathology Annual mammograms recommended, last 2016 Followed at the [facility]:  Relevant Hx: Course: Daily Update: Today's Plan: Overview:  Overview:  Mother with breast cancer later in life Two breast biopsies both with normal pathology Annual mammograms recommended, last 201   Essential (primary)  hypertension 02/19/2013   Overview:  Goal blood pressure < 135/85 On atenolol Overview:  Overview:  Goal blood pressure < 135/85 On atenolol   Hyperlipidemia 02/19/2013   Overview:  Goal LDL < 130, last value 92 in 2017 On Pravachol Overview:  Overview:  Goal LDL < 130, last value 82 in 2012 On Pravachol   Hypothyroidism 02/28/2012   Overview:  started on T4  TFT normal 2017 Followed by Dr. Almon Hercules Overview:  Overview:  started on T4  TFT normal 2014 Followed by Dr. Almon Hercules   Impaired glucose tolerance 02/20/2003   Overview:  Overview:  Glucose goal < 100, last value 94 in 2014 HgbA1c goal < 6.0, last value 5.3 in 2014 Urine microalbumin negative in 2014 On diet and exercise   Nontoxic multinodular goiter 03/06/2003   Overview:  Asymptomatic nodular thyroid Stable [place] 2004-2014 Followed by Dr. Almon Hercules Overview:  Overview:  Asymptomatic nodular thyroid Stable [place] 2004-2014 Followed by Dr. Almon Hercules   OSA on CPAP 01/13/2015   Overview:  Sleep study sent to medical records:   Sleep efficiency 22%, latency 24 min, # awakenings 11, res desat N=10, index 17.8  Apnea 1 for 16 sec, hypopnea 16 for 18 sec, total sleep time only 90 min so index = 11 She did not sleep much and the index puts her in mild category Repeat study a few days later when she slept 187 min her AHI = 4.6 which is normal, average O2 sat 94% I don't think    Osteoarthritis 02/20/2003  Overview:  Mild bilateral hands Right CMC Right hip Left knee s/p meniscus tear All symptomatically stable Overview:  Overview:  Mild bilateral hands Right CMC Right hip Left knee s/p meniscus tear All symptomatically stable   Osteoporosis 02/20/2003   Overview:  Bone density 2004 LS -3.32 and RH-1.62 Bone density 2008 LS -3.14 and RH -1.22 Bone density 2009 LS -3.17 and RH -1.21 Bone density 2010 LS -3.14 and RH -1.42  Bone density 2014 LS -2.7 (L3 -3.1) and RH -1.5 Bone density 2017 LS -2.3 and RH -1.4 (neck -1.7) Vitamin D level 38 in 2012 On Boniva 2004 to 2010,  restarted 2015 Repeat bone density in 2020 and stop Boniva  Last Assessment & Pl   Prediabetes 02/20/2003   Overview:  Glucose goal < 100, last value 91 in 2017 HgbA1c goal < 6.0, last value 5.6 in 2017 Urine microalbumin negative in 2017 On diet and exercise   Recurrent UTI 10/19/2014   Squamous cell carcinoma of skin 03/14/2019   Left mid brow. SCCis, hypertrophic. EDC   Past Surgical History:  Procedure Laterality Date   ABDOMINAL HYSTERECTOMY  1975   MASTECTOMY Left    URETEROSCOPY     URETEROSCOPY WITH HOLMIUM LASER LITHOTRIPSY     Family History  Problem Relation Age of Onset   Breast cancer Mother    Skin cancer Father    Social History   Socioeconomic History   Marital status: Widowed    Spouse name: Not on file   Number of children: Not on file   Years of education: Not on file   Highest education level: Not on file  Occupational History   Not on file  Tobacco Use   Smoking status: Never   Smokeless tobacco: Never  Vaping Use   Vaping Use: Never used  Substance and Sexual Activity   Alcohol use: No   Drug use: No   Sexual activity: Not Currently    Birth control/protection: Post-menopausal  Other Topics Concern   Not on file  Social History Narrative   Not on file   Social Determinants of Health   Financial Resource Strain: Low Risk    Difficulty of Paying Living Expenses: Not hard at all  Food Insecurity: No Food Insecurity   Worried About Charity fundraiser in the Last Year: Never true   [place] in the Last Year: Never true  Transportation Needs: No Transportation Needs   Lack of Transportation (Medical): No   Lack of Transportation (Non-Medical): No  Physical Activity: Sufficiently Active   Days of Exercise per Week: 5 days   Minutes of Exercise per Session: 30 min  Stress: No Stress Concern Present   Feeling of Stress : Not at all  Social Connections: Unknown   Frequency of Communication with Friends and Family: More than three times a  week   Frequency of Social Gatherings with Friends and Family: More than three times a week   Attends Religious Services: Not on file   Active Member of Clubs or Organizations: Not on file   Attends Archivist Meetings: Not on file   Marital Status: Widowed     Review of Systems     Objective:     Ht 5\' 2"  (1.575 m)   Wt 139 lb (63 kg)   BMI 25.42 kg/m  Wt Readings from Last 3 Encounters:  05/06/21 139 lb (63 kg)  04/06/21 142 lb (64.4 kg)  03/26/21 140 lb (63.5 kg)  Physical Exam   Outpatient Encounter Medications as of 05/06/2021  Medication Sig   amLODipine (NORVASC) 5 MG tablet TAKE 1 TABLET EVERY DAY   anastrozole (ARIMIDEX) 1 MG tablet Take 1 mg by mouth daily.   escitalopram (LEXAPRO) 10 MG tablet TAKE 1 TABLET BY MOUTH DAILY (FOR MOOD/DEPRESSION)   famotidine (PEPCID) 20 MG tablet TAKE 1 TABLET BY MOUTH DAILY   levothyroxine (SYNTHROID, LEVOTHROID) 75 MCG tablet    Multiple Vitamin (MULTIVITAMIN) capsule Take by mouth.   mupirocin ointment (BACTROBAN) 2 % Apply 1 application topically 2 (two) times daily.   pravastatin (PRAVACHOL) 40 MG tablet TAKE ONE TABLET EACH EVENING   vitamin B-12 (CYANOCOBALAMIN) 1000 MCG tablet Take 1 tablet (1,000 mcg total) by mouth daily.   No facility-administered encounter medications on file as of 05/06/2021.     Lab Results  Component Value Date   WBC 7.2 04/06/2021   HGB 14.7 04/06/2021   HCT 44.2 04/06/2021   PLT 226.0 04/06/2021   GLUCOSE 83 04/06/2021   CHOL 183 01/13/2021   TRIG 142.0 01/13/2021   HDL 65.80 01/13/2021   LDLCALC 89 01/13/2021   ALT 16 04/06/2021   AST 21 04/06/2021   NA 140 04/06/2021   K 4.0 04/06/2021   CL 103 04/06/2021   CREATININE 0.76 04/06/2021   BUN 16 04/06/2021   CO2 27 04/06/2021   TSH 1.55 04/06/2021   HGBA1C 5.5 04/06/2021    US BREAST LTD UNI LEFT INC AXILLA  Result Date: 11/24/2020 CLINICAL DATA:  87 year old female presenting as a recall from screening for  possible left breast distortion. EXAM: DIGITAL DIAGNOSTIC UNILATERAL LEFT MAMMOGRAM WITH TOMOSYNTHESIS AND CAD; ULTRASOUND LEFT BREAST LIMITED TECHNIQUE: Left digital diagnostic mammography and breast tomosynthesis was performed. The images were evaluated with computer-aided detection.; Targeted ultrasound examination of the left breast was performed COMPARISON:  Previous exam(s). ACR Breast Density Category b: There are scattered areas of fibroglandular density. FINDINGS: Mammogram: Spot compression tomosynthesis and full field mL tomosynthesis views of the left breast were performed. There is a persistent mass with distortion measuring approximately 1.2 cm in the retroareolar left breast. There appears to be extension to the nipple base. On physical exam I feel a discrete thickening in the retroareolar left breast. Ultrasound: Targeted ultrasound performed in the retroareolar left breast demonstrating an irregular hypoechoic mass measuring 1.6 x 1.5 x 1.4 cm. The mass extends to the base of the nipple. This corresponds to the mammographic finding. Targeted ultrasound of the left axilla demonstrates normal lymph nodes. IMPRESSION: Suspicious mass with distortion in the retroareolar left breast measuring 1.6 cm. RECOMMENDATION: Ultrasound-guided core needle biopsy of the left breast mass. I have discussed the findings and recommendations with the patient who agrees to proceed with biopsy. The patient will be contacted by our scheduler to arrange the biopsy appointment. BI-RADS CATEGORY  5: Highly suggestive of malignancy. Electronically Signed   By: Audie Pinto M.D.   On: 11/24/2020 14:38   MM DIAG BREAST TOMO UNI LEFT  Result Date: 11/24/2020 CLINICAL DATA:  87 year old female presenting as a recall from screening for possible left breast distortion. EXAM: DIGITAL DIAGNOSTIC UNILATERAL LEFT MAMMOGRAM WITH TOMOSYNTHESIS AND CAD; ULTRASOUND LEFT BREAST LIMITED TECHNIQUE: Left digital diagnostic mammography  and breast tomosynthesis was performed. The images were evaluated with computer-aided detection.; Targeted ultrasound examination of the left breast was performed COMPARISON:  Previous exam(s). ACR Breast Density Category b: There are scattered areas of fibroglandular density. FINDINGS: Mammogram: Spot compression tomosynthesis and full field mL  tomosynthesis views of the left breast were performed. There is a persistent mass with distortion measuring approximately 1.2 cm in the retroareolar left breast. There appears to be extension to the nipple base. On physical exam I feel a discrete thickening in the retroareolar left breast. Ultrasound: Targeted ultrasound performed in the retroareolar left breast demonstrating an irregular hypoechoic mass measuring 1.6 x 1.5 x 1.4 cm. The mass extends to the base of the nipple. This corresponds to the mammographic finding. Targeted ultrasound of the left axilla demonstrates normal lymph nodes. IMPRESSION: Suspicious mass with distortion in the retroareolar left breast measuring 1.6 cm. RECOMMENDATION: Ultrasound-guided core needle biopsy of the left breast mass. I have discussed the findings and recommendations with the patient who agrees to proceed with biopsy. The patient will be contacted by our scheduler to arrange the biopsy appointment. BI-RADS CATEGORY  5: Highly suggestive of malignancy. Electronically Signed   By: Audie Pinto M.D.   On: 11/24/2020 14:38       Assessment & Plan:   Problem List Items Addressed This Visit   None    Einar Pheasant, MD

## 2021-05-07 ENCOUNTER — Other Ambulatory Visit: Payer: Self-pay

## 2021-05-07 LAB — URINE CULTURE
MICRO NUMBER:: 12404193
SPECIMEN QUALITY:: ADEQUATE

## 2021-05-07 MED ORDER — NITROFURANTOIN MONOHYD MACRO 100 MG PO CAPS: 100.0000 mg | ORAL_CAPSULE | Freq: Two times a day (BID) | ORAL | 0 refills | Status: DC

## 2021-05-09 ENCOUNTER — Encounter: Payer: Self-pay | Admitting: Internal Medicine

## 2021-05-09 NOTE — Progress Notes (Signed)
Patient ID: Jennifer Bernard, female   DOB: 12-26-1926, 87 y.o.   MRN: [id number]   Virtual Visit via telephone Note  This visit type was conducted due to national recommendations for restrictions regarding the COVID-19 pandemic (e.g. social distancing).  This format is felt to be most appropriate for this patient at this time.  All issues noted in this document were discussed and addressed.  No physical exam was performed (except for noted visual exam findings with Video Visits).   I connected with Jennifer Bernard by telephone and verified that I am speaking with the correct person using two identifiers. Location patient: home Location provider: work  Persons participating in the telephone visit: patient, provider  The limitations, risks, security and privacy concerns of performing an evaluation and management service by telephone and the availability of in person appointments have been discussed.  It has also been discussed with the patient that there may be a patient responsible charge related to this service. The patient expressed understanding and agreed to proceed.   Reason for visit: work in appt  HPI: Work in with concerns regarding a possible UTI.  Has noticed increased nocturia.  Has had an issue with this previously, but has noticed worsening recently.  Dysuria.  No hematuria.  No vomiting.  No increased back pain.  No abdominal pain.  Eating and drinking.  No vomiting.  Occasional dysuria.    ROS: See pertinent positives and negatives per HPI.  Past Medical History:  Diagnosis Date   Asymptomatic menopausal state 02/20/1984   Overview:  Naturally occuring and asymptomatic Vaginal atrophy present on topical estrogen Overview:  Overview:  Naturally occuring and asymptomatic Vaginal atrophy present on topical estrogen   Basal cell carcinoma of skin 02/19/2013   Overview:  Basal cell on her nasolabial fold 2014 S/p Mohs by Dr. Lacinda Axon Followed by local dermatologist regularly Overview:   Overview:  Followed by Dr. Lacinda Axon   Cyst of ovary 02/19/2009   Overview:  Ovarian cyst on right stable 2004-2010 Cyst ? larger on CT 2012 Stable on follow up US Followed by Dr. Edwinna Areola Overview:  Overview:  Ovarian cyst on right stable 2004-2010 Cyst ? larger on CT 2012 Stable on follow up US Followed by Dr. Edwinna Areola   Depression, major, single episode, complete remission ([place]) 07/18/2017   Diffuse cystic mastopathy of breast 02/19/2013   Overview:  Mother with breast cancer later in life Two breast biopsies both with normal pathology Annual mammograms recommended, last 2016 Followed at the [facility]:  Relevant Hx: Course: Daily Update: Today's Plan: Overview:  Overview:  Mother with breast cancer later in life Two breast biopsies both with normal pathology Annual mammograms recommended, last 201   Essential (primary) hypertension 02/19/2013   Overview:  Goal blood pressure < 135/85 On atenolol Overview:  Overview:  Goal blood pressure < 135/85 On atenolol   Hyperlipidemia 02/19/2013   Overview:  Goal LDL < 130, last value 92 in 2017 On Pravachol Overview:  Overview:  Goal LDL < 130, last value 82 in 2012 On Pravachol   Hypothyroidism 02/28/2012   Overview:  started on T4  TFT normal 2017 Followed by Dr. Almon Hercules Overview:  Overview:  started on T4  TFT normal 2014 Followed by Dr. Almon Hercules   Impaired glucose tolerance 02/20/2003   Overview:  Overview:  Glucose goal < 100, last value 94 in 2014 HgbA1c goal < 6.0, last value 5.3 in 2014 Urine microalbumin negative in  2014 On diet and exercise   Nontoxic multinodular goiter 03/06/2003   Overview:  Asymptomatic nodular thyroid Stable [place] 2004-2014 Followed by Dr. Almon Hercules Overview:  Overview:  Asymptomatic nodular thyroid Stable [place] 2004-2014 Followed by Dr. Almon Hercules   OSA on CPAP 01/13/2015   Overview:  Sleep study sent to medical records:   Sleep efficiency 22%, latency 24 min, # awakenings 11, res desat N=10, index 17.8   Apnea 1 for 16 sec, hypopnea 16 for 18 sec, total sleep time only 90 min so index = 11 She did not sleep much and the index puts her in mild category Repeat study a few days later when she slept 187 min her AHI = 4.6 which is normal, average O2 sat 94% I don't think    Osteoarthritis 02/20/2003   Overview:  Mild bilateral hands Right CMC Right hip Left knee s/p meniscus tear All symptomatically stable Overview:  Overview:  Mild bilateral hands Right CMC Right hip Left knee s/p meniscus tear All symptomatically stable   Osteoporosis 02/20/2003   Overview:  Bone density 2004 LS -3.32 and RH-1.62 Bone density 2008 LS -3.14 and RH -1.22 Bone density 2009 LS -3.17 and RH -1.21 Bone density 2010 LS -3.14 and RH -1.42  Bone density 2014 LS -2.7 (L3 -3.1) and RH -1.5 Bone density 2017 LS -2.3 and RH -1.4 (neck -1.7) Vitamin D level 38 in 2012 On Boniva 2004 to 2010, restarted 2015 Repeat bone density in 2020 and stop Boniva  Last Assessment & Pl   Prediabetes 02/20/2003   Overview:  Glucose goal < 100, last value 91 in 2017 HgbA1c goal < 6.0, last value 5.6 in 2017 Urine microalbumin negative in 2017 On diet and exercise   Recurrent UTI 10/19/2014   Squamous cell carcinoma of skin 03/14/2019   Left mid brow. SCCis, hypertrophic. EDC    Past Surgical History:  Procedure Laterality Date   ABDOMINAL HYSTERECTOMY  1975   MASTECTOMY Left    URETEROSCOPY     URETEROSCOPY WITH HOLMIUM LASER LITHOTRIPSY      Family History  Problem Relation Age of Onset   Breast cancer Mother    Skin cancer Father     SOCIAL HX: reviewed.    Current Outpatient Medications:    amLODipine (NORVASC) 5 MG tablet, TAKE 1 TABLET EVERY DAY, Disp: 90 tablet, Rfl: 1   anastrozole (ARIMIDEX) 1 MG tablet, Take 1 mg by mouth daily., Disp: , Rfl:    escitalopram (LEXAPRO) 10 MG tablet, TAKE 1 TABLET BY MOUTH DAILY (FOR MOOD/DEPRESSION), Disp: 30 tablet, Rfl: 1   famotidine (PEPCID) 20 MG tablet, TAKE 1 TABLET BY MOUTH DAILY, Disp: 90  tablet, Rfl: 1   levothyroxine (SYNTHROID, LEVOTHROID) 75 MCG tablet, , Disp: , Rfl:    Multiple Vitamin (MULTIVITAMIN) capsule, Take by mouth., Disp: , Rfl:    mupirocin ointment (BACTROBAN) 2 %, Apply 1 application topically 2 (two) times daily., Disp: 22 g, Rfl: 0   pravastatin (PRAVACHOL) 40 MG tablet, TAKE ONE TABLET EACH EVENING, Disp: 90 tablet, Rfl: 1   vitamin B-12 (CYANOCOBALAMIN) 1000 MCG tablet, Take 1 tablet (1,000 mcg total) by mouth daily., Disp: 90 tablet, Rfl: 3   nitrofurantoin, macrocrystal-monohydrate, (MACROBID) 100 MG capsule, Take 1 capsule (100 mg total) by mouth 2 (two) times daily., Disp: 10 capsule, Rfl: 0  EXAM:  GENERAL: alert. Sounds to be in no acute distress.  Answering questions appropriately.   PSYCH/NEURO: pleasant and cooperative, no obvious depression or anxiety, speech and thought  processing grossly intact  ASSESSMENT AND PLAN:  Discussed the following assessment and plan:  Problem List Items Addressed This Visit     Essential (primary) hypertension    Continues on amlodipine.  Follow pressure.       Nocturia    Increased nocturia as outlined.  Occasional dysuria.  Concern regarding a possible UTI.  Discussed urinalysis results.  Discussed symptoms.  Stay hydrated.  Await urine culture results. If positive - abx.  Pt comfortable with this plan.         Return if symptoms worsen or fail to improve.   I discussed the assessment and treatment plan with the patient. The patient was provided an opportunity to ask questions and all were answered. The patient agreed with the plan and demonstrated an understanding of the instructions.   The patient was advised to call back or seek an in-person evaluation if the symptoms worsen or if the condition fails to improve as anticipated.  I provided 22 minutes of non-face-to-face time during this encounter.   Einar Pheasant, MD

## 2021-05-09 NOTE — Assessment & Plan Note (Signed)
Continues on amlodipine.  Follow pressure.

## 2021-05-09 NOTE — Assessment & Plan Note (Signed)
Increased nocturia as outlined.  Occasional dysuria.  Concern regarding a possible UTI.  Discussed urinalysis results.  Discussed symptoms.  Stay hydrated.  Await urine culture results. If positive - abx.  Pt comfortable with this plan.

## 2021-05-13 ENCOUNTER — Other Ambulatory Visit: Payer: Self-pay

## 2021-05-13 MED ORDER — NITROFURANTOIN MONOHYD MACRO 100 MG PO CAPS: 100.0000 mg | ORAL_CAPSULE | Freq: Two times a day (BID) | ORAL | 0 refills | Status: DC

## 2021-05-13 NOTE — Telephone Encounter (Signed)
Abx refilled. Patient is aware.

## 2021-05-13 NOTE — Telephone Encounter (Signed)
Patient calling back in and she finished her antibiotic yesterday. She is still having frequency and fatigue. She wants to know if she should get more antibiotic?   She is taking a probiotic while taking the antibiotics. Wants to know how long she should continue to take this?   Patient wanting to know if with the fatigue was she supposed to be resting and not doing what she normally does?

## 2021-05-13 NOTE — Telephone Encounter (Signed)
Patient stated that she felt better while she was on the antibiotic. Finished abx yesterday. Having frequency again. Advised to stay on the probiotics for 2 more weeks. Are you ok with me sending in a refill of the abx? Confirmed no new symptoms. Fatigue is something that has been going on, patient says she cant really relate that to the UTI since she has had so much going on recently (breast cancer, covid) she has been more tired.

## 2021-05-13 NOTE — Telephone Encounter (Signed)
Ok to refill abx x 1.  If persistent problems, will need to be reevaluated

## 2021-05-17 ENCOUNTER — Telehealth: Payer: Self-pay | Admitting: Internal Medicine

## 2021-05-17 NOTE — Telephone Encounter (Signed)
Patient called in stating she has questions about starting a new medication, she did not specify which one.She is also requesting a call back today,please advise.

## 2021-05-17 NOTE — Telephone Encounter (Signed)
Patient states she noticed a bit of blood on her toilet tissue. She states she is taking Macrobid and has 3 days left. States she has not taken this since last night.   States she was having urinary frequency so was give the antibiotic. She had no blood in her urine previously.   Please advise

## 2021-05-18 NOTE — Telephone Encounter (Signed)
Patient stated that she goes to [facility] tomorrow and she is going to get them to do a pelvic exam while they are doing her wellness exam. She has not noticed any blood today. No other acute symptoms. Will f/u with patient after.

## 2021-05-19 DIAGNOSIS — E039 Hypothyroidism, unspecified: Secondary | ICD-10-CM | POA: Diagnosis not present

## 2021-05-19 DIAGNOSIS — R011 Cardiac murmur, unspecified: Secondary | ICD-10-CM | POA: Diagnosis not present

## 2021-05-19 DIAGNOSIS — Z961 Presence of intraocular lens: Secondary | ICD-10-CM | POA: Diagnosis not present

## 2021-05-19 DIAGNOSIS — M810 Age-related osteoporosis without current pathological fracture: Secondary | ICD-10-CM | POA: Diagnosis not present

## 2021-05-19 DIAGNOSIS — I10 Essential (primary) hypertension: Secondary | ICD-10-CM | POA: Diagnosis not present

## 2021-05-19 DIAGNOSIS — E7800 Pure hypercholesterolemia, unspecified: Secondary | ICD-10-CM | POA: Diagnosis not present

## 2021-05-19 DIAGNOSIS — Z0001 Encounter for general adult medical examination with abnormal findings: Secondary | ICD-10-CM | POA: Diagnosis not present

## 2021-05-19 DIAGNOSIS — E042 Nontoxic multinodular goiter: Secondary | ICD-10-CM | POA: Diagnosis not present

## 2021-05-19 DIAGNOSIS — R7303 Prediabetes: Secondary | ICD-10-CM | POA: Diagnosis not present

## 2021-05-19 DIAGNOSIS — Z780 Asymptomatic menopausal state: Secondary | ICD-10-CM | POA: Diagnosis not present

## 2021-05-19 DIAGNOSIS — Z136 Encounter for screening for cardiovascular disorders: Secondary | ICD-10-CM | POA: Diagnosis not present

## 2021-05-19 DIAGNOSIS — Z23 Encounter for immunization: Secondary | ICD-10-CM | POA: Diagnosis not present

## 2021-05-19 DIAGNOSIS — Z0000 Encounter for general adult medical examination without abnormal findings: Secondary | ICD-10-CM | POA: Diagnosis not present

## 2021-05-21 ENCOUNTER — Other Ambulatory Visit: Payer: Self-pay | Admitting: Internal Medicine

## 2021-06-02 DIAGNOSIS — Z9012 Acquired absence of left breast and nipple: Secondary | ICD-10-CM | POA: Diagnosis not present

## 2021-06-02 DIAGNOSIS — L7634 Postprocedural seroma of skin and subcutaneous tissue following other procedure: Secondary | ICD-10-CM | POA: Diagnosis not present

## 2021-06-02 DIAGNOSIS — R011 Cardiac murmur, unspecified: Secondary | ICD-10-CM | POA: Diagnosis not present

## 2021-06-02 DIAGNOSIS — C50912 Malignant neoplasm of unspecified site of left female breast: Secondary | ICD-10-CM | POA: Diagnosis not present

## 2021-06-02 DIAGNOSIS — I083 Combined rheumatic disorders of mitral, aortic and tricuspid valves: Secondary | ICD-10-CM | POA: Diagnosis not present

## 2021-07-12 ENCOUNTER — Ambulatory Visit (INDEPENDENT_AMBULATORY_CARE_PROVIDER_SITE_OTHER): Payer: Medicare HMO | Admitting: Internal Medicine

## 2021-07-12 ENCOUNTER — Other Ambulatory Visit: Payer: Self-pay

## 2021-07-12 ENCOUNTER — Encounter: Payer: Self-pay | Admitting: Internal Medicine

## 2021-07-12 VITALS — BP 124/80 | HR 93 | Temp 97.2°F | Ht 62.0 in | Wt 135.8 lb

## 2021-07-12 DIAGNOSIS — R739 Hyperglycemia, unspecified: Secondary | ICD-10-CM

## 2021-07-12 DIAGNOSIS — R059 Cough, unspecified: Secondary | ICD-10-CM | POA: Diagnosis not present

## 2021-07-12 DIAGNOSIS — I10 Essential (primary) hypertension: Secondary | ICD-10-CM | POA: Diagnosis not present

## 2021-07-12 DIAGNOSIS — Z853 Personal history of malignant neoplasm of breast: Secondary | ICD-10-CM

## 2021-07-12 DIAGNOSIS — E785 Hyperlipidemia, unspecified: Secondary | ICD-10-CM

## 2021-07-12 DIAGNOSIS — F325 Major depressive disorder, single episode, in full remission: Secondary | ICD-10-CM

## 2021-07-12 DIAGNOSIS — E039 Hypothyroidism, unspecified: Secondary | ICD-10-CM | POA: Diagnosis not present

## 2021-07-12 DIAGNOSIS — I77810 Thoracic aortic ectasia: Secondary | ICD-10-CM | POA: Diagnosis not present

## 2021-07-12 MED ORDER — AZITHROMYCIN 250 MG PO TABS: ORAL_TABLET | ORAL | 0 refills | Status: AC

## 2021-07-12 NOTE — Patient Instructions (Signed)
Take a probiotic daily while you are on the antibiotic and for two weeks after completing the antibiotic   

## 2021-07-12 NOTE — Progress Notes (Addendum)
Patient ID: Jennifer Bernard, female   DOB: 03/17/1927, 87 y.o.   MRN: [id number]   Subjective:    Patient ID: Jennifer Bernard, female    DOB: 1926-11-16, 87 y.o.   MRN: [id number]  This visit occurred during the SARS-CoV-2 public health emergency.  Safety protocols were in place, including screening questions prior to the visit, additional usage of staff PPE, and extensive cleaning of exam room while observing appropriate contact time as indicated for disinfecting solutions.   Patient here for a scheduled follow up.   Chief Complaint  Patient presents with   Follow-up   Cough    Ongoing for one week with brown phlegm    .   HPI Has a history of breast cancer s/p left mastectomy 02/08/21. Followed by Dr Adela Ports.  Reports over the last week - increased congestion and cough.  No increased sinus pressure.  Some throat congestion.  Cough - productive colored phlegm.  No chest pain.  No increased sob.  Feels breathing is stable.  Occasional diarrhea.  No known sick contacts.    Past Medical History:  Diagnosis Date   Asymptomatic menopausal state 02/20/1984   Overview:  Naturally occuring and asymptomatic Vaginal atrophy present on topical estrogen Overview:  Overview:  Naturally occuring and asymptomatic Vaginal atrophy present on topical estrogen   Basal cell carcinoma of skin 02/19/2013   Overview:  Basal cell on her nasolabial fold 2014 S/p Mohs by Dr. Lacinda Axon Followed by local dermatologist regularly Overview:  Overview:  Followed by Dr. Lacinda Axon   Cyst of ovary 02/19/2009   Overview:  Ovarian cyst on right stable 2004-2010 Cyst ? larger on CT 2012 Stable on follow up US Followed by Dr. Edwinna Areola Overview:  Overview:  Ovarian cyst on right stable 2004-2010 Cyst ? larger on CT 2012 Stable on follow up US Followed by Dr. Edwinna Areola   Depression, major, single episode, complete remission ([place]) 07/18/2017   Diffuse cystic mastopathy of breast 02/19/2013   Overview:  Mother with breast cancer  later in life Two breast biopsies both with normal pathology Annual mammograms recommended, last 2016 Followed at the [facility]:  Relevant Hx: Course: Daily Update: Today's Plan: Overview:  Overview:  Mother with breast cancer later in life Two breast biopsies both with normal pathology Annual mammograms recommended, last 201   Essential (primary) hypertension 02/19/2013   Overview:  Goal blood pressure < 135/85 On atenolol Overview:  Overview:  Goal blood pressure < 135/85 On atenolol   Hyperlipidemia 02/19/2013   Overview:  Goal LDL < 130, last value 92 in 2017 On Pravachol Overview:  Overview:  Goal LDL < 130, last value 82 in 2012 On Pravachol   Hypothyroidism 02/28/2012   Overview:  started on T4  TFT normal 2017 Followed by Dr. Almon Hercules Overview:  Overview:  started on T4  TFT normal 2014 Followed by Dr. Almon Hercules   Impaired glucose tolerance 02/20/2003   Overview:  Overview:  Glucose goal < 100, last value 94 in 2014 HgbA1c goal < 6.0, last value 5.3 in 2014 Urine microalbumin negative in 2014 On diet and exercise   Nontoxic multinodular goiter 03/06/2003   Overview:  Asymptomatic nodular thyroid Stable [place] 2004-2014 Followed by Dr. Almon Hercules Overview:  Overview:  Asymptomatic nodular thyroid Stable [place] 2004-2014 Followed by Dr. Almon Hercules   OSA on CPAP 01/13/2015   Overview:  Sleep study sent to medical records:   Sleep efficiency 22%, latency 24 min, #  awakenings 11, res desat N=10, index 17.8  Apnea 1 for 16 sec, hypopnea 16 for 18 sec, total sleep time only 90 min so index = 11 She did not sleep much and the index puts her in mild category Repeat study a few days later when she slept 187 min her AHI = 4.6 which is normal, average O2 sat 94% I don't think    Osteoarthritis 02/20/2003   Overview:  Mild bilateral hands Right CMC Right hip Left knee s/p meniscus tear All symptomatically stable Overview:  Overview:  Mild bilateral hands Right CMC Right hip Left knee s/p  meniscus tear All symptomatically stable   Osteoporosis 02/20/2003   Overview:  Bone density 2004 LS -3.32 and RH-1.62 Bone density 2008 LS -3.14 and RH -1.22 Bone density 2009 LS -3.17 and RH -1.21 Bone density 2010 LS -3.14 and RH -1.42  Bone density 2014 LS -2.7 (L3 -3.1) and RH -1.5 Bone density 2017 LS -2.3 and RH -1.4 (neck -1.7) Vitamin D level 38 in 2012 On Boniva 2004 to 2010, restarted 2015 Repeat bone density in 2020 and stop Boniva  Last Assessment & Pl   Prediabetes 02/20/2003   Overview:  Glucose goal < 100, last value 91 in 2017 HgbA1c goal < 6.0, last value 5.6 in 2017 Urine microalbumin negative in 2017 On diet and exercise   Recurrent UTI 10/19/2014   Squamous cell carcinoma of skin 03/14/2019   Left mid brow. SCCis, hypertrophic. EDC   Past Surgical History:  Procedure Laterality Date   ABDOMINAL HYSTERECTOMY  1975   MASTECTOMY Left    URETEROSCOPY     URETEROSCOPY WITH HOLMIUM LASER LITHOTRIPSY     Family History  Problem Relation Age of Onset   Breast cancer Mother    Skin cancer Father    Social History   Socioeconomic History   Marital status: Widowed    Spouse name: Not on file   Number of children: Not on file   Years of education: Not on file   Highest education level: Not on file  Occupational History   Not on file  Tobacco Use   Smoking status: Never   Smokeless tobacco: Never  Vaping Use   Vaping Use: Never used  Substance and Sexual Activity   Alcohol use: No   Drug use: No   Sexual activity: Not Currently    Birth control/protection: Post-menopausal  Other Topics Concern   Not on file  Social History Narrative   Not on file   Social Determinants of Health   Financial Resource Strain: Low Risk    Difficulty of Paying Living Expenses: Not hard at all  Food Insecurity: No Food Insecurity   Worried About Charity fundraiser in the Last Year: Never true   [place] in the Last Year: Never true  Transportation Needs: No Transportation  Needs   Lack of Transportation (Medical): No   Lack of Transportation (Non-Medical): No  Physical Activity: Sufficiently Active   Days of Exercise per Week: 5 days   Minutes of Exercise per Session: 30 min  Stress: No Stress Concern Present   Feeling of Stress : Not at all  Social Connections: Unknown   Frequency of Communication with Friends and Family: More than three times a week   Frequency of Social Gatherings with Friends and Family: More than three times a week   Attends Religious Services: Not on file   Active Member of Clubs or Organizations: Not on file  Attends Archivist Meetings: Not on file   Marital Status: Widowed     Review of Systems  Constitutional:  Negative for appetite change and unexpected weight change.  HENT:  Positive for postnasal drip. Negative for sinus pressure.        Throat congestion.   Respiratory:  Positive for cough. Negative for chest tightness and shortness of breath.        Increased congestion.    Cardiovascular:  Negative for chest pain, palpitations and leg swelling.  Gastrointestinal:  Negative for abdominal pain, nausea and vomiting.       Occasional diarrhea.    Genitourinary:  Negative for difficulty urinating and dysuria.  Musculoskeletal:  Negative for joint swelling and myalgias.  Skin:  Negative for color change and rash.  Neurological:  Negative for dizziness and headaches.  Psychiatric/Behavioral:  Negative for agitation and dysphoric mood.       Objective:     BP 124/80 (BP Location: Left Arm, Patient Position: Sitting, Cuff Size: Large)   Pulse 93   Temp (!) 97.2 F (36.2 C) (Temporal)   Ht '5\' 2"'  (1.575 m)   Wt 135 lb 12.8 oz (61.6 kg)   SpO2 97%   BMI 24.84 kg/m  Wt Readings from Last 3 Encounters:  07/12/21 135 lb 12.8 oz (61.6 kg)  05/06/21 139 lb (63 kg)  04/06/21 142 lb (64.4 kg)    Physical Exam Vitals reviewed.  Constitutional:      General: She is not in acute distress.    Appearance:  Normal appearance.  HENT:     Head: Normocephalic and atraumatic.     Right Ear: External ear normal.     Left Ear: External ear normal.  Eyes:     General: No scleral icterus.       Right eye: No discharge.        Left eye: No discharge.     Conjunctiva/sclera: Conjunctivae normal.  Neck:     Thyroid: No thyromegaly.  Cardiovascular:     Rate and Rhythm: Normal rate and regular rhythm.  Pulmonary:     Effort: No respiratory distress.     Breath sounds: Normal breath sounds. No wheezing.  Abdominal:     General: Bowel sounds are normal.     Palpations: Abdomen is soft.     Tenderness: There is no abdominal tenderness.  Musculoskeletal:        General: No swelling or tenderness.     Cervical back: Neck supple. No tenderness.  Lymphadenopathy:     Cervical: No cervical adenopathy.  Skin:    Findings: No erythema or rash.  Neurological:     Mental Status: She is alert.  Psychiatric:        Mood and Affect: Mood normal.        Behavior: Behavior normal.     Outpatient Encounter Medications as of 07/12/2021  Medication Sig   amLODipine (NORVASC) 5 MG tablet TAKE 1 TABLET EVERY DAY   anastrozole (ARIMIDEX) 1 MG tablet Take 1 mg by mouth daily.   [EXPIRED] azithromycin (ZITHROMAX) 250 MG tablet Take 2 tablets on day 1, then 1 tablet daily on days 2 through 5   escitalopram (LEXAPRO) 10 MG tablet TAKE 1 TABLET BY MOUTH DAILY (FOR MOOD/DEPRESSION)   famotidine (PEPCID) 20 MG tablet TAKE 1 TABLET BY MOUTH DAILY   levothyroxine (SYNTHROID, LEVOTHROID) 75 MCG tablet    Multiple Vitamin (MULTIVITAMIN) capsule Take by mouth.   mupirocin ointment (BACTROBAN) 2 %  Apply 1 application topically 2 (two) times daily.   nitrofurantoin, macrocrystal-monohydrate, (MACROBID) 100 MG capsule Take 1 capsule (100 mg total) by mouth 2 (two) times daily.   pravastatin (PRAVACHOL) 40 MG tablet TAKE ONE TABLET EACH EVENING   vitamin B-12 (CYANOCOBALAMIN) 1000 MCG tablet Take 1 tablet (1,000 mcg  total) by mouth daily.   No facility-administered encounter medications on file as of 07/12/2021.     Lab Results  Component Value Date   WBC 7.2 04/06/2021   HGB 14.7 04/06/2021   HCT 44.2 04/06/2021   PLT 226.0 04/06/2021   GLUCOSE 83 04/06/2021   CHOL 183 01/13/2021   TRIG 142.0 01/13/2021   HDL 65.80 01/13/2021   LDLCALC 89 01/13/2021   ALT 16 04/06/2021   AST 21 04/06/2021   NA 140 04/06/2021   K 4.0 04/06/2021   CL 103 04/06/2021   CREATININE 0.76 04/06/2021   BUN 16 04/06/2021   CO2 27 04/06/2021   TSH 1.55 04/06/2021   HGBA1C 5.5 04/06/2021    US BREAST LTD UNI LEFT INC AXILLA  Result Date: 11/24/2020 CLINICAL DATA:  87 year old female presenting as a recall from screening for possible left breast distortion. EXAM: DIGITAL DIAGNOSTIC UNILATERAL LEFT MAMMOGRAM WITH TOMOSYNTHESIS AND CAD; ULTRASOUND LEFT BREAST LIMITED TECHNIQUE: Left digital diagnostic mammography and breast tomosynthesis was performed. The images were evaluated with computer-aided detection.; Targeted ultrasound examination of the left breast was performed COMPARISON:  Previous exam(s). ACR Breast Density Category b: There are scattered areas of fibroglandular density. FINDINGS: Mammogram: Spot compression tomosynthesis and full field mL tomosynthesis views of the left breast were performed. There is a persistent mass with distortion measuring approximately 1.2 cm in the retroareolar left breast. There appears to be extension to the nipple base. On physical exam I feel a discrete thickening in the retroareolar left breast. Ultrasound: Targeted ultrasound performed in the retroareolar left breast demonstrating an irregular hypoechoic mass measuring 1.6 x 1.5 x 1.4 cm. The mass extends to the base of the nipple. This corresponds to the mammographic finding. Targeted ultrasound of the left axilla demonstrates normal lymph nodes. IMPRESSION: Suspicious mass with distortion in the retroareolar left breast measuring  1.6 cm. RECOMMENDATION: Ultrasound-guided core needle biopsy of the left breast mass. I have discussed the findings and recommendations with the patient who agrees to proceed with biopsy. The patient will be contacted by our scheduler to arrange the biopsy appointment. BI-RADS CATEGORY  5: Highly suggestive of malignancy. Electronically Signed   By: Audie Pinto M.D.   On: 11/24/2020 14:38   MM DIAG BREAST TOMO UNI LEFT  Result Date: 11/24/2020 CLINICAL DATA:  87 year old female presenting as a recall from screening for possible left breast distortion. EXAM: DIGITAL DIAGNOSTIC UNILATERAL LEFT MAMMOGRAM WITH TOMOSYNTHESIS AND CAD; ULTRASOUND LEFT BREAST LIMITED TECHNIQUE: Left digital diagnostic mammography and breast tomosynthesis was performed. The images were evaluated with computer-aided detection.; Targeted ultrasound examination of the left breast was performed COMPARISON:  Previous exam(s). ACR Breast Density Category b: There are scattered areas of fibroglandular density. FINDINGS: Mammogram: Spot compression tomosynthesis and full field mL tomosynthesis views of the left breast were performed. There is a persistent mass with distortion measuring approximately 1.2 cm in the retroareolar left breast. There appears to be extension to the nipple base. On physical exam I feel a discrete thickening in the retroareolar left breast. Ultrasound: Targeted ultrasound performed in the retroareolar left breast demonstrating an irregular hypoechoic mass measuring 1.6 x 1.5 x 1.4 cm. The mass extends to  the base of the nipple. This corresponds to the mammographic finding. Targeted ultrasound of the left axilla demonstrates normal lymph nodes. IMPRESSION: Suspicious mass with distortion in the retroareolar left breast measuring 1.6 cm. RECOMMENDATION: Ultrasound-guided core needle biopsy of the left breast mass. I have discussed the findings and recommendations with the patient who agrees to proceed with biopsy.  The patient will be contacted by our scheduler to arrange the biopsy appointment. BI-RADS CATEGORY  5: Highly suggestive of malignancy. Electronically Signed   By: Audie Pinto M.D.   On: 11/24/2020 14:38       Assessment & Plan:   Problem List Items Addressed This Visit     Cough - Primary    Cough and congestion as outlined.  Persistent.  No wheezing. No chest pain or chest tightness.  Robitussin DM.  Zpak.  Saline nasal spray and nasacort nasal spray as directed.  Check cxr.  Swab for flu, covid and RSV.  Discussed quarantine guidelines.        Relevant Orders   COVID-19, Flu A+B and RSV (Completed)   Depression, major, single episode, complete remission ([place])    Continue lexapro.  Stable.       Essential (primary) hypertension    Off amlodipine.  Just recently stopped.  Follow pressures.  Follow metabolic panel.        History of breast cancer    S/p mastectomy.  Prescribed arimidex.  Desires not to take. Followed by oncology.        Hyperglycemia    Follow met b and a1c.       Hyperlipidemia    On pravastatin.  Low cholesterol diet and exercise.  Follow lipid panel and liver function tests.        Hypothyroidism    On thyroid replacement.  Follow tsh.       Thoracic aortic ectasia ([facility])    Continue blood pressure control - amlodipine.  Continue pravastatin.         Einar Pheasant, MD

## 2021-07-13 LAB — COVID-19, FLU A+B AND RSV
Influenza A, NAA: NOT DETECTED
Influenza B, NAA: NOT DETECTED
RSV, NAA: NOT DETECTED
SARS-CoV-2, NAA: NOT DETECTED

## 2021-07-16 ENCOUNTER — Telehealth: Payer: Self-pay | Admitting: Internal Medicine

## 2021-07-16 NOTE — Telephone Encounter (Signed)
Tried to call son no answer only person on DPR left message to call office will continue to try. Patient son says that its just the back of her neck that it is real sore and he does not know anymore. I advised son with no more information she really needs tobe evaluated he said he would notify his sister.

## 2021-07-16 NOTE — Telephone Encounter (Signed)
With congestion and recent symptoms and with increased pain and unable to get out of bed, agree with need for evaluation.  Will need further information to determine how best to treat.

## 2021-07-16 NOTE — Telephone Encounter (Signed)
Pts daughter brought mother in to be seen on 11/28 at 11:30. She states pt was given an antibiotic on Monday and then Tuesday she began having neck pain. She is unsure if its  a pulled muscle. She says the pt stated she could hardly get out of bed this morning. Pt would like a call in regards to this. Due to [place] not having any available appts she was advised that if the pain persist before she receives a call back to pls go to the UC or ED

## 2021-07-16 NOTE — Telephone Encounter (Signed)
Advised patient son needs to be evaluated at UC patient son stated he would advise sister to take her.

## 2021-07-17 ENCOUNTER — Encounter: Payer: Self-pay | Admitting: Internal Medicine

## 2021-07-17 NOTE — Assessment & Plan Note (Signed)
Off amlodipine.  Just recently stopped.  Follow pressures.  Follow metabolic panel.

## 2021-07-17 NOTE — Assessment & Plan Note (Signed)
Cough and congestion as outlined.  Persistent.  No wheezing. No chest pain or chest tightness.  Robitussin DM.  Zpak.  Saline nasal spray and nasacort nasal spray as directed.  Check cxr.  Swab for flu, covid and RSV.  Discussed quarantine guidelines.

## 2021-07-17 NOTE — Assessment & Plan Note (Signed)
S/p mastectomy.  Prescribed arimidex.  Desires not to take. Followed by oncology.   

## 2021-07-17 NOTE — Assessment & Plan Note (Signed)
On pravastatin.  Low cholesterol diet and exercise.  Follow lipid panel and liver function tests.   

## 2021-07-17 NOTE — Assessment & Plan Note (Signed)
Follow met b and a1c.  

## 2021-07-17 NOTE — Assessment & Plan Note (Signed)
On thyroid replacement.  Follow tsh.  

## 2021-07-17 NOTE — Assessment & Plan Note (Signed)
Continue lexapro.  Stable.  

## 2021-07-18 DIAGNOSIS — I77810 Thoracic aortic ectasia: Secondary | ICD-10-CM | POA: Insufficient documentation

## 2021-07-18 NOTE — Assessment & Plan Note (Signed)
Continue blood pressure control - amlodipine.  Continue pravastatin.  

## 2021-07-20 ENCOUNTER — Telehealth: Payer: Self-pay | Admitting: Internal Medicine

## 2021-07-20 NOTE — Telephone Encounter (Signed)
The patient still has a cough per her daughter . She is has finished her antibiotic and is presently taking Robitussin. The cough has gotten a little better, but not gone. She wants to know what can be done to get rid of the cough.

## 2021-07-21 NOTE — Telephone Encounter (Signed)
Patient seen 07/12/21 given Z -pack used robitussin DM for cough, patient is feeling better but still coughing daughter is wanting to know what can patient do for cough.

## 2021-07-21 NOTE — Telephone Encounter (Signed)
If she is having persistent cough, needs to be seen.  May need cxr, etc.  See if she can come in tomorrow at 2:30.

## 2021-07-21 NOTE — Telephone Encounter (Signed)
Patient scheduled.

## 2021-07-22 ENCOUNTER — Other Ambulatory Visit: Payer: Self-pay

## 2021-07-22 ENCOUNTER — Ambulatory Visit (INDEPENDENT_AMBULATORY_CARE_PROVIDER_SITE_OTHER): Payer: Medicare HMO | Admitting: Internal Medicine

## 2021-07-22 ENCOUNTER — Encounter: Payer: Self-pay | Admitting: Internal Medicine

## 2021-07-22 ENCOUNTER — Telehealth: Payer: Self-pay | Admitting: Internal Medicine

## 2021-07-22 DIAGNOSIS — R059 Cough, unspecified: Secondary | ICD-10-CM

## 2021-07-22 DIAGNOSIS — R5383 Other fatigue: Secondary | ICD-10-CM

## 2021-07-22 DIAGNOSIS — I10 Essential (primary) hypertension: Secondary | ICD-10-CM

## 2021-07-22 NOTE — Telephone Encounter (Signed)
Line busy

## 2021-07-22 NOTE — Telephone Encounter (Signed)
Patient called in trying to see if need to fast or need to take  Levethyroxine before appt  today 2:30. Please call her at  [phone] or  daughter Eustaquio Maize  at [phone]

## 2021-07-22 NOTE — Progress Notes (Signed)
Patient ID: Jennifer Bernard, female   DOB: June 22, 1927, 87 y.o.   MRN: [id number]   Subjective:    Patient ID: Jennifer Bernard, female    DOB: May 29, 1927, 87 y.o.   MRN: [id number]  This visit occurred during the SARS-CoV-2 public health emergency.  Safety protocols were in place, including screening questions prior to the visit, additional usage of staff PPE, and extensive cleaning of exam room while observing appropriate contact time as indicated for disinfecting solutions.   Patient here for work in appt .   HPI Work in for persistent cough.  Was seen 07/12/21 for increased cough and congestion.  Swab for flu, covid and RSV - negative.  Treated with zpak.  She is doing better.  Does feel better.  Still with increased fatigue.  Previously had neck discomfort - muscles tight in her neck.  This is better.  Increased rom now.  No increased sinus pressure or congestion.  No chest congestion.  Cough is better.  No sob.  No sore throat.  Still with some decreased appetite.  No nausea or vomiting.     Past Medical History:  Diagnosis Date   Asymptomatic menopausal state 02/20/1984   Overview:  Naturally occuring and asymptomatic Vaginal atrophy present on topical estrogen Overview:  Overview:  Naturally occuring and asymptomatic Vaginal atrophy present on topical estrogen   Basal cell carcinoma of skin 02/19/2013   Overview:  Basal cell on her nasolabial fold 2014 S/p Mohs by Dr. Lacinda Axon Followed by local dermatologist regularly Overview:  Overview:  Followed by Dr. Lacinda Axon   Cyst of ovary 02/19/2009   Overview:  Ovarian cyst on right stable 2004-2010 Cyst ? larger on CT 2012 Stable on follow up US Followed by Dr. Edwinna Areola Overview:  Overview:  Ovarian cyst on right stable 2004-2010 Cyst ? larger on CT 2012 Stable on follow up US Followed by Dr. Edwinna Areola   Depression, major, single episode, complete remission ([place]) 07/18/2017   Diffuse cystic mastopathy of breast 02/19/2013   Overview:  Mother with  breast cancer later in life Two breast biopsies both with normal pathology Annual mammograms recommended, last 2016 Followed at the [facility]:  Relevant Hx: Course: Daily Update: Today's Plan: Overview:  Overview:  Mother with breast cancer later in life Two breast biopsies both with normal pathology Annual mammograms recommended, last 201   Essential (primary) hypertension 02/19/2013   Overview:  Goal blood pressure < 135/85 On atenolol Overview:  Overview:  Goal blood pressure < 135/85 On atenolol   Hyperlipidemia 02/19/2013   Overview:  Goal LDL < 130, last value 92 in 2017 On Pravachol Overview:  Overview:  Goal LDL < 130, last value 82 in 2012 On Pravachol   Hypothyroidism 02/28/2012   Overview:  started on T4  TFT normal 2017 Followed by Dr. Almon Hercules Overview:  Overview:  started on T4  TFT normal 2014 Followed by Dr. Almon Hercules   Impaired glucose tolerance 02/20/2003   Overview:  Overview:  Glucose goal < 100, last value 94 in 2014 HgbA1c goal < 6.0, last value 5.3 in 2014 Urine microalbumin negative in 2014 On diet and exercise   Nontoxic multinodular goiter 03/06/2003   Overview:  Asymptomatic nodular thyroid Stable [place] 2004-2014 Followed by Dr. Almon Hercules Overview:  Overview:  Asymptomatic nodular thyroid Stable [place] 2004-2014 Followed by Dr. Almon Hercules   OSA on CPAP 01/13/2015   Overview:  Sleep study sent to medical records:   Sleep efficiency  22%, latency 24 min, # awakenings 11, res desat N=10, index 17.8  Apnea 1 for 16 sec, hypopnea 16 for 18 sec, total sleep time only 90 min so index = 11 She did not sleep much and the index puts her in mild category Repeat study a few days later when she slept 187 min her AHI = 4.6 which is normal, average O2 sat 94% I don't think    Osteoarthritis 02/20/2003   Overview:  Mild bilateral hands Right CMC Right hip Left knee s/p meniscus tear All symptomatically stable Overview:  Overview:  Mild bilateral hands Right CMC Right hip Left  knee s/p meniscus tear All symptomatically stable   Osteoporosis 02/20/2003   Overview:  Bone density 2004 LS -3.32 and RH-1.62 Bone density 2008 LS -3.14 and RH -1.22 Bone density 2009 LS -3.17 and RH -1.21 Bone density 2010 LS -3.14 and RH -1.42  Bone density 2014 LS -2.7 (L3 -3.1) and RH -1.5 Bone density 2017 LS -2.3 and RH -1.4 (neck -1.7) Vitamin D level 38 in 2012 On Boniva 2004 to 2010, restarted 2015 Repeat bone density in 2020 and stop Boniva  Last Assessment & Pl   Prediabetes 02/20/2003   Overview:  Glucose goal < 100, last value 91 in 2017 HgbA1c goal < 6.0, last value 5.6 in 2017 Urine microalbumin negative in 2017 On diet and exercise   Recurrent UTI 10/19/2014   Squamous cell carcinoma of skin 03/14/2019   Left mid brow. SCCis, hypertrophic. EDC   Past Surgical History:  Procedure Laterality Date   ABDOMINAL HYSTERECTOMY  1975   MASTECTOMY Left    URETEROSCOPY     URETEROSCOPY WITH HOLMIUM LASER LITHOTRIPSY     Family History  Problem Relation Age of Onset   Breast cancer Mother    Skin cancer Father    Social History   Socioeconomic History   Marital status: Widowed    Spouse name: Not on file   Number of children: Not on file   Years of education: Not on file   Highest education level: Not on file  Occupational History   Not on file  Tobacco Use   Smoking status: Never   Smokeless tobacco: Never  Vaping Use   Vaping Use: Never used  Substance and Sexual Activity   Alcohol use: No   Drug use: No   Sexual activity: Not Currently    Birth control/protection: Post-menopausal  Other Topics Concern   Not on file  Social History Narrative   Not on file   Social Determinants of Health   Financial Resource Strain: Low Risk    Difficulty of Paying Living Expenses: Not hard at all  Food Insecurity: No Food Insecurity   Worried About Charity fundraiser in the Last Year: Never true   [place] in the Last Year: Never true  Transportation Needs: No  Transportation Needs   Lack of Transportation (Medical): No   Lack of Transportation (Non-Medical): No  Physical Activity: Sufficiently Active   Days of Exercise per Week: 5 days   Minutes of Exercise per Session: 30 min  Stress: No Stress Concern Present   Feeling of Stress : Not at all  Social Connections: Unknown   Frequency of Communication with Friends and Family: More than three times a week   Frequency of Social Gatherings with Friends and Family: More than three times a week   Attends Religious Services: Not on Electrical engineer or Organizations:  Not on file   Attends Club or Organization Meetings: Not on file   Marital Status: Widowed     Review of Systems  Constitutional:  Positive for fatigue. Negative for unexpected weight change.       Decreased appetite.   HENT:  Negative for congestion and sinus pressure.   Respiratory:  Negative for chest tightness and shortness of breath.        Cough is better.   Cardiovascular:  Negative for chest pain, palpitations and leg swelling.  Gastrointestinal:  Negative for abdominal pain, diarrhea, nausea and vomiting.  Genitourinary:  Negative for difficulty urinating and dysuria.  Musculoskeletal:  Negative for joint swelling and myalgias.  Neurological:  Negative for dizziness, light-headedness and headaches.  Psychiatric/Behavioral:  Negative for agitation and dysphoric mood.       Objective:     BP 120/80   Pulse 83   Temp 98.5 F (36.9 C) (Oral)   Ht 5\' 2"  (1.575 m)   Wt 138 lb 6.4 oz (62.8 kg)   SpO2 99%   BMI 25.31 kg/m  Wt Readings from Last 3 Encounters:  07/22/21 138 lb 6.4 oz (62.8 kg)  07/12/21 135 lb 12.8 oz (61.6 kg)  05/06/21 139 lb (63 kg)    Physical Exam Vitals reviewed.  Constitutional:      General: She is not in acute distress.    Appearance: Normal appearance.  HENT:     Head: Normocephalic and atraumatic.     Right Ear: External ear normal.     Left Ear: External ear normal.   Eyes:     General: No scleral icterus.       Right eye: No discharge.        Left eye: No discharge.     Conjunctiva/sclera: Conjunctivae normal.  Neck:     Thyroid: No thyromegaly.  Cardiovascular:     Rate and Rhythm: Normal rate and regular rhythm.  Pulmonary:     Effort: No respiratory distress.     Breath sounds: Normal breath sounds. No wheezing.  Abdominal:     General: Bowel sounds are normal.     Palpations: Abdomen is soft.     Tenderness: There is no abdominal tenderness.  Musculoskeletal:        General: No swelling or tenderness.     Cervical back: Neck supple. No tenderness.  Lymphadenopathy:     Cervical: No cervical adenopathy.  Skin:    Findings: No erythema or rash.  Neurological:     Mental Status: She is alert.  Psychiatric:        Mood and Affect: Mood normal.        Behavior: Behavior normal.     Outpatient Encounter Medications as of 07/22/2021  Medication Sig   amLODipine (NORVASC) 5 MG tablet TAKE 1 TABLET EVERY DAY   escitalopram (LEXAPRO) 10 MG tablet TAKE 1 TABLET BY MOUTH DAILY (FOR MOOD/DEPRESSION)   famotidine (PEPCID) 20 MG tablet TAKE 1 TABLET BY MOUTH DAILY   FLUZONE HIGH-DOSE QUADRIVALENT 0.7 ML SUSY    levothyroxine (SYNTHROID, LEVOTHROID) 75 MCG tablet    losartan (COZAAR) 25 MG tablet Take 25 mg by mouth daily.   pravastatin (PRAVACHOL) 40 MG tablet TAKE ONE TABLET EACH EVENING   vitamin B-12 (CYANOCOBALAMIN) 1000 MCG tablet Take 1 tablet (1,000 mcg total) by mouth daily.   [DISCONTINUED] anastrozole (ARIMIDEX) 1 MG tablet Take 1 mg by mouth daily. (Patient not taking: Reported on 07/22/2021)   [DISCONTINUED] Multiple Vitamin (MULTIVITAMIN) capsule  Take by mouth. (Patient not taking: Reported on 07/22/2021)   [DISCONTINUED] mupirocin ointment (BACTROBAN) 2 % Apply 1 application topically 2 (two) times daily. (Patient not taking: Reported on 07/22/2021)   [DISCONTINUED] nitrofurantoin, macrocrystal-monohydrate, (MACROBID) 100 MG capsule  Take 1 capsule (100 mg total) by mouth 2 (two) times daily. (Patient not taking: Reported on 07/22/2021)   No facility-administered encounter medications on file as of 07/22/2021.     Lab Results  Component Value Date   WBC 7.2 04/06/2021   HGB 14.7 04/06/2021   HCT 44.2 04/06/2021   PLT 226.0 04/06/2021   GLUCOSE 83 04/06/2021   CHOL 183 01/13/2021   TRIG 142.0 01/13/2021   HDL 65.80 01/13/2021   LDLCALC 89 01/13/2021   ALT 16 04/06/2021   AST 21 04/06/2021   NA 140 04/06/2021   K 4.0 04/06/2021   CL 103 04/06/2021   CREATININE 0.76 04/06/2021   BUN 16 04/06/2021   CO2 27 04/06/2021   TSH 1.55 04/06/2021   HGBA1C 5.5 04/06/2021    US BREAST LTD UNI LEFT INC AXILLA  Result Date: 11/24/2020 CLINICAL DATA:  87 year old female presenting as a recall from screening for possible left breast distortion. EXAM: DIGITAL DIAGNOSTIC UNILATERAL LEFT MAMMOGRAM WITH TOMOSYNTHESIS AND CAD; ULTRASOUND LEFT BREAST LIMITED TECHNIQUE: Left digital diagnostic mammography and breast tomosynthesis was performed. The images were evaluated with computer-aided detection.; Targeted ultrasound examination of the left breast was performed COMPARISON:  Previous exam(s). ACR Breast Density Category b: There are scattered areas of fibroglandular density. FINDINGS: Mammogram: Spot compression tomosynthesis and full field mL tomosynthesis views of the left breast were performed. There is a persistent mass with distortion measuring approximately 1.2 cm in the retroareolar left breast. There appears to be extension to the nipple base. On physical exam I feel a discrete thickening in the retroareolar left breast. Ultrasound: Targeted ultrasound performed in the retroareolar left breast demonstrating an irregular hypoechoic mass measuring 1.6 x 1.5 x 1.4 cm. The mass extends to the base of the nipple. This corresponds to the mammographic finding. Targeted ultrasound of the left axilla demonstrates normal lymph nodes.  IMPRESSION: Suspicious mass with distortion in the retroareolar left breast measuring 1.6 cm. RECOMMENDATION: Ultrasound-guided core needle biopsy of the left breast mass. I have discussed the findings and recommendations with the patient who agrees to proceed with biopsy. The patient will be contacted by our scheduler to arrange the biopsy appointment. BI-RADS CATEGORY  5: Highly suggestive of malignancy. Electronically Signed   By: Audie Pinto M.D.   On: 11/24/2020 14:38   MM DIAG BREAST TOMO UNI LEFT  Result Date: 11/24/2020 CLINICAL DATA:  87 year old female presenting as a recall from screening for possible left breast distortion. EXAM: DIGITAL DIAGNOSTIC UNILATERAL LEFT MAMMOGRAM WITH TOMOSYNTHESIS AND CAD; ULTRASOUND LEFT BREAST LIMITED TECHNIQUE: Left digital diagnostic mammography and breast tomosynthesis was performed. The images were evaluated with computer-aided detection.; Targeted ultrasound examination of the left breast was performed COMPARISON:  Previous exam(s). ACR Breast Density Category b: There are scattered areas of fibroglandular density. FINDINGS: Mammogram: Spot compression tomosynthesis and full field mL tomosynthesis views of the left breast were performed. There is a persistent mass with distortion measuring approximately 1.2 cm in the retroareolar left breast. There appears to be extension to the nipple base. On physical exam I feel a discrete thickening in the retroareolar left breast. Ultrasound: Targeted ultrasound performed in the retroareolar left breast demonstrating an irregular hypoechoic mass measuring 1.6 x 1.5 x 1.4 cm. The mass extends  to the base of the nipple. This corresponds to the mammographic finding. Targeted ultrasound of the left axilla demonstrates normal lymph nodes. IMPRESSION: Suspicious mass with distortion in the retroareolar left breast measuring 1.6 cm. RECOMMENDATION: Ultrasound-guided core needle biopsy of the left breast mass. I have discussed  the findings and recommendations with the patient who agrees to proceed with biopsy. The patient will be contacted by our scheduler to arrange the biopsy appointment. BI-RADS CATEGORY  5: Highly suggestive of malignancy. Electronically Signed   By: Audie Pinto M.D.   On: 11/24/2020 14:38       Assessment & Plan:   Problem List Items Addressed This Visit     Cough    Recently treated with zpak.  Covid, flu and RSV swab negative.  Cough is better.  Lungs clear.  Hold on cxr and further w/up.  Advance diet slowly.  Follow.  Neck is better.  Increased rom.       Essential (primary) hypertension    Blood pressure as outlined.  Follow pressures.       Relevant Medications   losartan (COZAAR) 25 MG tablet   Fatigue    Fatigue after recent illness.  Discussed. She is feeling better.  Advance diet slowly.  Follow.  Call with update.          Einar Pheasant, MD

## 2021-07-26 ENCOUNTER — Encounter: Payer: Self-pay | Admitting: Internal Medicine

## 2021-07-26 NOTE — Assessment & Plan Note (Signed)
Fatigue after recent illness.  Discussed. She is feeling better.  Advance diet slowly.  Follow.  Call with update.

## 2021-07-26 NOTE — Assessment & Plan Note (Signed)
Blood pressure as outlined.  Follow pressures.

## 2021-07-26 NOTE — Assessment & Plan Note (Signed)
Recently treated with zpak.  Covid, flu and RSV swab negative.  Cough is better.  Lungs clear.  Hold on cxr and further w/up.  Advance diet slowly.  Follow.  Neck is better.  Increased rom.

## 2021-07-27 DIAGNOSIS — Z79811 Long term (current) use of aromatase inhibitors: Secondary | ICD-10-CM | POA: Diagnosis not present

## 2021-07-27 DIAGNOSIS — Z9189 Other specified personal risk factors, not elsewhere classified: Secondary | ICD-10-CM | POA: Diagnosis not present

## 2021-07-27 DIAGNOSIS — Z08 Encounter for follow-up examination after completed treatment for malignant neoplasm: Secondary | ICD-10-CM | POA: Diagnosis not present

## 2021-07-27 DIAGNOSIS — Z780 Asymptomatic menopausal state: Secondary | ICD-10-CM | POA: Diagnosis not present

## 2021-07-27 DIAGNOSIS — M8589 Other specified disorders of bone density and structure, multiple sites: Secondary | ICD-10-CM | POA: Diagnosis not present

## 2021-07-27 DIAGNOSIS — Z853 Personal history of malignant neoplasm of breast: Secondary | ICD-10-CM | POA: Diagnosis not present

## 2021-07-27 DIAGNOSIS — Z9049 Acquired absence of other specified parts of digestive tract: Secondary | ICD-10-CM | POA: Diagnosis not present

## 2021-07-27 DIAGNOSIS — M899 Disorder of bone, unspecified: Secondary | ICD-10-CM | POA: Diagnosis not present

## 2021-07-27 DIAGNOSIS — Z90710 Acquired absence of both cervix and uterus: Secondary | ICD-10-CM | POA: Diagnosis not present

## 2021-07-27 DIAGNOSIS — Z9012 Acquired absence of left breast and nipple: Secondary | ICD-10-CM | POA: Diagnosis not present

## 2021-08-04 DIAGNOSIS — H04123 Dry eye syndrome of bilateral lacrimal glands: Secondary | ICD-10-CM | POA: Diagnosis not present

## 2021-08-18 DIAGNOSIS — E038 Other specified hypothyroidism: Secondary | ICD-10-CM | POA: Diagnosis not present

## 2021-08-18 DIAGNOSIS — R5383 Other fatigue: Secondary | ICD-10-CM | POA: Diagnosis not present

## 2021-08-18 DIAGNOSIS — E042 Nontoxic multinodular goiter: Secondary | ICD-10-CM | POA: Diagnosis not present

## 2021-08-18 DIAGNOSIS — E039 Hypothyroidism, unspecified: Secondary | ICD-10-CM | POA: Diagnosis not present

## 2021-08-18 DIAGNOSIS — I10 Essential (primary) hypertension: Secondary | ICD-10-CM | POA: Diagnosis not present

## 2021-08-21 ENCOUNTER — Other Ambulatory Visit: Payer: Self-pay | Admitting: Internal Medicine

## 2021-08-30 DIAGNOSIS — H6123 Impacted cerumen, bilateral: Secondary | ICD-10-CM | POA: Diagnosis not present

## 2021-08-30 DIAGNOSIS — H903 Sensorineural hearing loss, bilateral: Secondary | ICD-10-CM | POA: Diagnosis not present

## 2021-08-31 ENCOUNTER — Other Ambulatory Visit: Payer: Self-pay | Admitting: Internal Medicine

## 2021-10-07 ENCOUNTER — Other Ambulatory Visit: Payer: Self-pay

## 2021-10-07 ENCOUNTER — Telehealth: Payer: Self-pay | Admitting: Internal Medicine

## 2021-10-07 NOTE — Telephone Encounter (Signed)
Patients daughter stated that her mom is off her BP medication (amlodipine 5 mg) Looked in chart and patient has stopped taking since November. Says her BP is elevated. The two readings I have are 136/91 and 146/85. No headache, dizziness, flushed feeling, chest pain, sob, etc. Feeling ok. She needs an appt to discuss restarting medication but wanted to see how long you want her to monitor since she only has 2 readings. We could work her in week after next unless you would prefer to see her sooner?

## 2021-10-07 NOTE — Telephone Encounter (Signed)
Ok to schedule her for appt next week if needed.  I will have all 7:00 open if can come in at 7:00.  Also, have her spot check her pressure. Bring in readings.  If problem, let us know.  Also, need to confirm if she is taking losartan.

## 2021-10-07 NOTE — Telephone Encounter (Signed)
Pt daughter called in stating that pt have some health concerns. Pt daughter stated that Pt blood pressure is up. Pt daughter stated that Pt has been taking her own blood pressure . Pt daughter didn't provide readings. Pt daughter stated that Pt has been getting up in the middle of the night going to the bathroom. Pt daughter requesting for appt. No avail appt. Pt requesting callback at [phone]

## 2021-10-08 NOTE — Telephone Encounter (Signed)
Patient scheduled for appt. Per records, is not taking losartan and stopped amlodipine.

## 2021-10-08 NOTE — Telephone Encounter (Signed)
Pt returning call. Pt requesting callback.  °

## 2021-10-08 NOTE — Telephone Encounter (Signed)
LMTCB

## 2021-10-18 ENCOUNTER — Other Ambulatory Visit: Payer: Self-pay

## 2021-10-18 ENCOUNTER — Ambulatory Visit (INDEPENDENT_AMBULATORY_CARE_PROVIDER_SITE_OTHER): Payer: Medicare HMO | Admitting: Internal Medicine

## 2021-10-18 DIAGNOSIS — R251 Tremor, unspecified: Secondary | ICD-10-CM

## 2021-10-18 DIAGNOSIS — E039 Hypothyroidism, unspecified: Secondary | ICD-10-CM

## 2021-10-18 DIAGNOSIS — E042 Nontoxic multinodular goiter: Secondary | ICD-10-CM

## 2021-10-18 DIAGNOSIS — I10 Essential (primary) hypertension: Secondary | ICD-10-CM

## 2021-10-18 DIAGNOSIS — F325 Major depressive disorder, single episode, in full remission: Secondary | ICD-10-CM | POA: Diagnosis not present

## 2021-10-18 DIAGNOSIS — R739 Hyperglycemia, unspecified: Secondary | ICD-10-CM

## 2021-10-18 DIAGNOSIS — Z853 Personal history of malignant neoplasm of breast: Secondary | ICD-10-CM

## 2021-10-18 DIAGNOSIS — I77810 Thoracic aortic ectasia: Secondary | ICD-10-CM | POA: Diagnosis not present

## 2021-10-18 DIAGNOSIS — E785 Hyperlipidemia, unspecified: Secondary | ICD-10-CM

## 2021-10-18 MED ORDER — AMLODIPINE BESYLATE 5 MG PO TABS: 5.0000 mg | ORAL_TABLET | Freq: Every day | ORAL | 1 refills | Status: DC

## 2021-10-18 NOTE — Progress Notes (Signed)
Patient ID: Jennifer Bernard, female   DOB: 16-Oct-1926, 88 y.o.   MRN: [id number]   Subjective:    Patient ID: Jennifer Bernard, female    DOB: December 03, 1926, 88 y.o.   MRN: [id number]  This visit occurred during the SARS-CoV-2 public health emergency.  Safety protocols were in place, including screening questions prior to the visit, additional usage of staff PPE, and extensive cleaning of exam room while observing appropriate contact time as indicated for disinfecting solutions.   Patient here for a scheduled follow up.   Chief Complaint  Patient presents with   Hypertension   .   HPI She is accompanied by her daughter.  History obtained from both of them.  Saw endocrinology - thyroid ultrasound - update periodically - deferred until next year - stable nodule.  Is s/p left mastectomy for invasive breast cancer.  Being followed by medical oncology.  No chest pain or sob reported.  No abdominal pain or bowel change reported.  Does report noticing tremor.  Persistent - intermittent.  Request neurology referral for further evaluation.  Request handicap placard - msk limitations. Blood pressure is elevated.  Off amlodipine.     Past Medical History:  Diagnosis Date   Asymptomatic menopausal state 02/20/1984   Overview:  Naturally occuring and asymptomatic Vaginal atrophy present on topical estrogen Overview:  Overview:  Naturally occuring and asymptomatic Vaginal atrophy present on topical estrogen   Basal cell carcinoma of skin 02/19/2013   Overview:  Basal cell on her nasolabial fold 2014 S/p Mohs by Dr. Lacinda Axon Followed by local dermatologist regularly Overview:  Overview:  Followed by Dr. Lacinda Axon   Cyst of ovary 02/19/2009   Overview:  Ovarian cyst on right stable 2004-2010 Cyst ? larger on CT 2012 Stable on follow up US Followed by Dr. Edwinna Areola Overview:  Overview:  Ovarian cyst on right stable 2004-2010 Cyst ? larger on CT 2012 Stable on follow up US Followed by Dr. Edwinna Areola   Depression,  major, single episode, complete remission ([place]) 07/18/2017   Diffuse cystic mastopathy of breast 02/19/2013   Overview:  Mother with breast cancer later in life Two breast biopsies both with normal pathology Annual mammograms recommended, last 2016 Followed at [facility]:  Relevant Hx: Course: Daily Update: Today's Plan: Overview:  Overview:  Mother with breast cancer later in life Two breast biopsies both with normal pathology Annual mammograms recommended, last 201   Essential (primary) hypertension 02/19/2013   Overview:  Goal blood pressure < 135/85 On atenolol Overview:  Overview:  Goal blood pressure < 135/85 On atenolol   Hyperlipidemia 02/19/2013   Overview:  Goal LDL < 130, last value 92 in 2017 On Pravachol Overview:  Overview:  Goal LDL < 130, last value 82 in 2012 On Pravachol   Hypothyroidism 02/28/2012   Overview:  started on T4  TFT normal 2017 Followed by Dr. Almon Hercules Overview:  Overview:  started on T4  TFT normal 2014 Followed by Dr. Almon Hercules   Impaired glucose tolerance 02/20/2003   Overview:  Overview:  Glucose goal < 100, last value 94 in 2014 HgbA1c goal < 6.0, last value 5.3 in 2014 Urine microalbumin negative in 2014 On diet and exercise   Nontoxic multinodular goiter 03/06/2003   Overview:  Asymptomatic nodular thyroid Stable [place] 2004-2014 Followed by Dr. Almon Hercules Overview:  Overview:  Asymptomatic nodular thyroid Stable [place] 2004-2014 Followed by Dr. Almon Hercules   OSA on CPAP 01/13/2015   Overview:  Sleep study sent to medical records:   Sleep efficiency 22%, latency 24 min, # awakenings 11, res desat N=10, index 17.8  Apnea 1 for 16 sec, hypopnea 16 for 18 sec, total sleep time only 90 min so index = 11 She did not sleep much and the index puts her in mild category Repeat study a few days later when she slept 187 min her AHI = 4.6 which is normal, average O2 sat 94% I don't think    Osteoarthritis 02/20/2003   Overview:  Mild bilateral hands Right CMC  Right hip Left knee s/p meniscus tear All symptomatically stable Overview:  Overview:  Mild bilateral hands Right CMC Right hip Left knee s/p meniscus tear All symptomatically stable   Osteoporosis 02/20/2003   Overview:  Bone density 2004 LS -3.32 and RH-1.62 Bone density 2008 LS -3.14 and RH -1.22 Bone density 2009 LS -3.17 and RH -1.21 Bone density 2010 LS -3.14 and RH -1.42  Bone density 2014 LS -2.7 (L3 -3.1) and RH -1.5 Bone density 2017 LS -2.3 and RH -1.4 (neck -1.7) Vitamin D level 38 in 2012 On Boniva 2004 to 2010, restarted 2015 Repeat bone density in 2020 and stop Boniva  Last Assessment & Pl   Prediabetes 02/20/2003   Overview:  Glucose goal < 100, last value 91 in 2017 HgbA1c goal < 6.0, last value 5.6 in 2017 Urine microalbumin negative in 2017 On diet and exercise   Recurrent UTI 10/19/2014   Squamous cell carcinoma of skin 03/14/2019   Left mid brow. SCCis, hypertrophic. EDC   Past Surgical History:  Procedure Laterality Date   ABDOMINAL HYSTERECTOMY  1975   MASTECTOMY Left    URETEROSCOPY     URETEROSCOPY WITH HOLMIUM LASER LITHOTRIPSY     Family History  Problem Relation Age of Onset   Breast cancer Mother    Skin cancer Father    Social History   Socioeconomic History   Marital status: Widowed    Spouse name: Not on file   Number of children: Not on file   Years of education: Not on file   Highest education level: Not on file  Occupational History   Not on file  Tobacco Use   Smoking status: Never   Smokeless tobacco: Never  Vaping Use   Vaping Use: Never used  Substance and Sexual Activity   Alcohol use: No   Drug use: No   Sexual activity: Not Currently    Birth control/protection: Post-menopausal  Other Topics Concern   Not on file  Social History Narrative   Not on file   Social Determinants of Health   Financial Resource Strain: Low Risk    Difficulty of Paying Living Expenses: Not hard at all  Food Insecurity: No Food Insecurity   Worried About  Charity fundraiser in the Last Year: Never true   [place] in the Last Year: Never true  Transportation Needs: No Transportation Needs   Lack of Transportation (Medical): No   Lack of Transportation (Non-Medical): No  Physical Activity: Sufficiently Active   Days of Exercise per Week: 5 days   Minutes of Exercise per Session: 30 min  Stress: No Stress Concern Present   Feeling of Stress : Not at all  Social Connections: Unknown   Frequency of Communication with Friends and Family: More than three times a week   Frequency of Social Gatherings with Friends and Family: More than three times a week   Attends Religious Services: Not  on file   Active Member of Clubs or Organizations: Not on file   Attends Club or Organization Meetings: Not on file   Marital Status: Widowed     Review of Systems  Constitutional:  Negative for appetite change and unexpected weight change.  HENT:  Negative for congestion and sinus pressure.   Respiratory:  Negative for cough, chest tightness and shortness of breath.   Cardiovascular:  Negative for chest pain, palpitations and leg swelling.  Gastrointestinal:  Negative for abdominal pain, diarrhea, nausea and vomiting.  Genitourinary:  Negative for difficulty urinating and dysuria.  Musculoskeletal:  Negative for joint swelling and myalgias.  Skin:  Negative for color change and rash.  Neurological:  Negative for dizziness, light-headedness and headaches.  Psychiatric/Behavioral:  Negative for agitation and dysphoric mood.       Objective:     BP 140/82    Pulse 83    Temp 97.6 F (36.4 C)    Resp 16    Ht '5\' 3"'  (1.6 m)    Wt 140 lb (63.5 kg)    SpO2 98%    BMI 24.80 kg/m  Wt Readings from Last 3 Encounters:  10/18/21 140 lb (63.5 kg)  07/22/21 138 lb 6.4 oz (62.8 kg)  07/12/21 135 lb 12.8 oz (61.6 kg)    Physical Exam Vitals reviewed.  Constitutional:      General: She is not in acute distress.    Appearance: Normal appearance.  HENT:      Head: Normocephalic and atraumatic.     Right Ear: External ear normal.     Left Ear: External ear normal.  Eyes:     General: No scleral icterus.       Right eye: No discharge.        Left eye: No discharge.     Conjunctiva/sclera: Conjunctivae normal.  Neck:     Thyroid: No thyromegaly.  Cardiovascular:     Rate and Rhythm: Normal rate and regular rhythm.  Pulmonary:     Effort: No respiratory distress.     Breath sounds: Normal breath sounds. No wheezing.  Abdominal:     General: Bowel sounds are normal.     Palpations: Abdomen is soft.     Tenderness: There is no abdominal tenderness.  Musculoskeletal:        General: No swelling or tenderness.     Cervical back: Neck supple. No tenderness.  Lymphadenopathy:     Cervical: No cervical adenopathy.  Skin:    Findings: No erythema or rash.  Neurological:     Mental Status: She is alert.  Psychiatric:        Mood and Affect: Mood normal.        Behavior: Behavior normal.     Outpatient Encounter Medications as of 10/18/2021  Medication Sig   amLODipine (NORVASC) 5 MG tablet Take 1 tablet (5 mg total) by mouth daily.   escitalopram (LEXAPRO) 10 MG tablet TAKE 1 TABLET BY MOUTH DAILY (FOR MOOD/DEPRESSION)   famotidine (PEPCID) 20 MG tablet TAKE 1 TABLET BY MOUTH DAILY   levothyroxine (SYNTHROID, LEVOTHROID) 75 MCG tablet    pravastatin (PRAVACHOL) 40 MG tablet TAKE ONE TABLET EACH EVENING   vitamin B-12 (CYANOCOBALAMIN) 1000 MCG tablet Take 1 tablet (1,000 mcg total) by mouth daily.   [DISCONTINUED] amLODipine (NORVASC) 5 MG tablet TAKE 1 TABLET EVERY DAY   [DISCONTINUED] FLUZONE HIGH-DOSE QUADRIVALENT 0.7 ML SUSY    No facility-administered encounter medications on file as of 10/18/2021.  Lab Results  Component Value Date   WBC 7.2 04/06/2021   HGB 14.7 04/06/2021   HCT 44.2 04/06/2021   PLT 226.0 04/06/2021   GLUCOSE 83 04/06/2021   CHOL 183 01/13/2021   TRIG 142.0 01/13/2021   HDL 65.80 01/13/2021    LDLCALC 89 01/13/2021   ALT 16 04/06/2021   AST 21 04/06/2021   NA 140 04/06/2021   K 4.0 04/06/2021   CL 103 04/06/2021   CREATININE 0.76 04/06/2021   BUN 16 04/06/2021   CO2 27 04/06/2021   TSH 1.55 04/06/2021   HGBA1C 5.5 04/06/2021    US BREAST LTD UNI LEFT INC AXILLA  Result Date: 11/24/2020 CLINICAL DATA:  88 year old female presenting as a recall from screening for possible left breast distortion. EXAM: DIGITAL DIAGNOSTIC UNILATERAL LEFT MAMMOGRAM WITH TOMOSYNTHESIS AND CAD; ULTRASOUND LEFT BREAST LIMITED TECHNIQUE: Left digital diagnostic mammography and breast tomosynthesis was performed. The images were evaluated with computer-aided detection.; Targeted ultrasound examination of the left breast was performed COMPARISON:  Previous exam(s). ACR Breast Density Category b: There are scattered areas of fibroglandular density. FINDINGS: Mammogram: Spot compression tomosynthesis and full field mL tomosynthesis views of the left breast were performed. There is a persistent mass with distortion measuring approximately 1.2 cm in the retroareolar left breast. There appears to be extension to the nipple base. On physical exam I feel a discrete thickening in the retroareolar left breast. Ultrasound: Targeted ultrasound performed in the retroareolar left breast demonstrating an irregular hypoechoic mass measuring 1.6 x 1.5 x 1.4 cm. The mass extends to the base of the nipple. This corresponds to the mammographic finding. Targeted ultrasound of the left axilla demonstrates normal lymph nodes. IMPRESSION: Suspicious mass with distortion in the retroareolar left breast measuring 1.6 cm. RECOMMENDATION: Ultrasound-guided core needle biopsy of the left breast mass. I have discussed the findings and recommendations with the patient who agrees to proceed with biopsy. The patient will be contacted by our scheduler to arrange the biopsy appointment. BI-RADS CATEGORY  5: Highly suggestive of malignancy.  Electronically Signed   By: Audie Pinto M.D.   On: 11/24/2020 14:38   MM DIAG BREAST TOMO UNI LEFT  Result Date: 11/24/2020 CLINICAL DATA:  88 year old female presenting as a recall from screening for possible left breast distortion. EXAM: DIGITAL DIAGNOSTIC UNILATERAL LEFT MAMMOGRAM WITH TOMOSYNTHESIS AND CAD; ULTRASOUND LEFT BREAST LIMITED TECHNIQUE: Left digital diagnostic mammography and breast tomosynthesis was performed. The images were evaluated with computer-aided detection.; Targeted ultrasound examination of the left breast was performed COMPARISON:  Previous exam(s). ACR Breast Density Category b: There are scattered areas of fibroglandular density. FINDINGS: Mammogram: Spot compression tomosynthesis and full field mL tomosynthesis views of the left breast were performed. There is a persistent mass with distortion measuring approximately 1.2 cm in the retroareolar left breast. There appears to be extension to the nipple base. On physical exam I feel a discrete thickening in the retroareolar left breast. Ultrasound: Targeted ultrasound performed in the retroareolar left breast demonstrating an irregular hypoechoic mass measuring 1.6 x 1.5 x 1.4 cm. The mass extends to the base of the nipple. This corresponds to the mammographic finding. Targeted ultrasound of the left axilla demonstrates normal lymph nodes. IMPRESSION: Suspicious mass with distortion in the retroareolar left breast measuring 1.6 cm. RECOMMENDATION: Ultrasound-guided core needle biopsy of the left breast mass. I have discussed the findings and recommendations with the patient who agrees to proceed with biopsy. The patient will be contacted by our scheduler to arrange the biopsy  appointment. BI-RADS CATEGORY  5: Highly suggestive of malignancy. Electronically Signed   By: Audie Pinto M.D.   On: 11/24/2020 14:38       Assessment & Plan:   Problem List Items Addressed This Visit     Depression, major, single episode,  complete remission ([place])    Continue lexapro.  Stable.       Essential (primary) hypertension    Blood pressure as outlined.  Elevated.  Restart amlodipine 7m q day.  Follow pressures.       Relevant Medications   amLODipine (NORVASC) 5 MG tablet   History of breast cancer    S/p mastectomy.  Prescribed arimidex.  Desires not to take. Followed by oncology.        Hyperglycemia    Follow met b and a1c.       Hyperlipidemia    On pravastatin.  Low cholesterol diet and exercise.  Follow lipid panel and liver function tests.        Relevant Medications   amLODipine (NORVASC) 5 MG tablet   Hypothyroidism    On thyroid replacement.  Follow tsh.       Nontoxic multinodular goiter    Saw endocrinology.  Stable.       Thoracic aortic ectasia ([facility])    Continue blood pressure control - amlodipine.  Continue pravastatin.       Relevant Medications   amLODipine (NORVASC) 5 MG tablet   Tremor    Persistent intermittent tremor noticed.  Request referral to neurology for further evaluation.       Relevant Orders   Ambulatory referral to Neurology     CEinar Pheasant MD

## 2021-10-19 ENCOUNTER — Telehealth: Payer: Self-pay | Admitting: Internal Medicine

## 2021-10-19 NOTE — Telephone Encounter (Signed)
Patient was not taking amlodipine. Confirmed. She is going to start ? ?She is taking: ?Escitalopram  ?Famotidine ?Levothyroxine ?Pravastatin ?B12 1000 ?Centrum multi vitamin ?

## 2021-10-19 NOTE — Telephone Encounter (Signed)
Pt would like to be called regarding medication 

## 2021-10-25 ENCOUNTER — Encounter: Payer: Self-pay | Admitting: Internal Medicine

## 2021-10-25 DIAGNOSIS — R251 Tremor, unspecified: Secondary | ICD-10-CM | POA: Insufficient documentation

## 2021-10-25 IMAGING — MG DIGITAL DIAGNOSTIC BILAT W/ TOMO W/ CAD
8 series · 8 of 24 positions shown · non-contrast
Comparison: Previous exam(s).

CLINICAL DATA: [AGE] female presenting for evaluation of
right nipple inversion and right retroareolar pain. The patient
states she has noticed no difference in the appearance of her nipple
as far as retraction, however she did notice some bumps on the
areola after she inspected her nipple due to the tenderness.

EXAM:
DIGITAL DIAGNOSTIC BILATERAL MAMMOGRAM WITH CAD AND TOMO
ULTRASOUND RIGHT BREAST

[R MLO synth-2D]
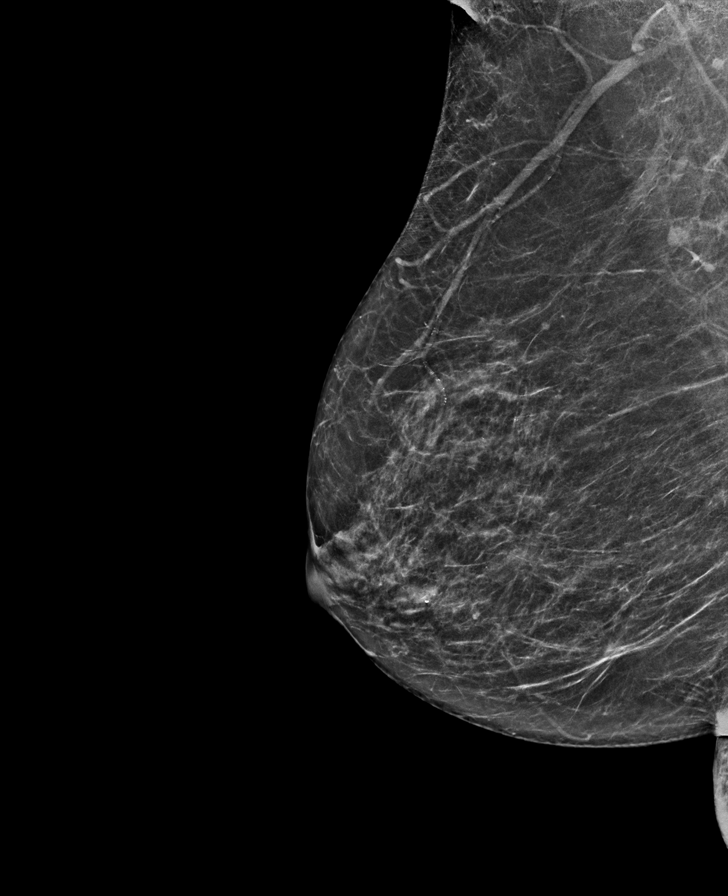

[L MLO synth-2D]
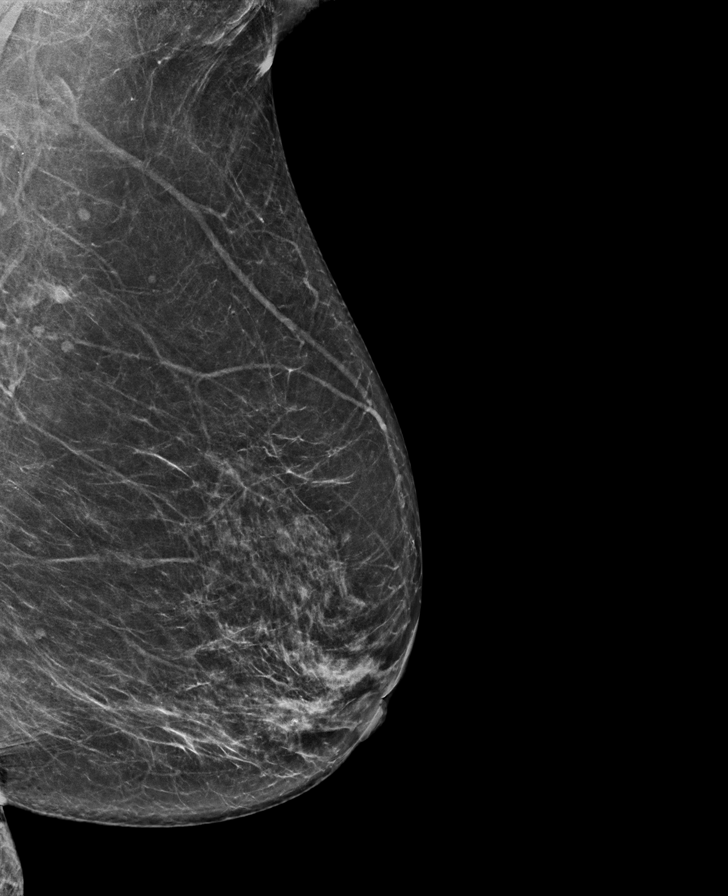

[R CC synth-2D]
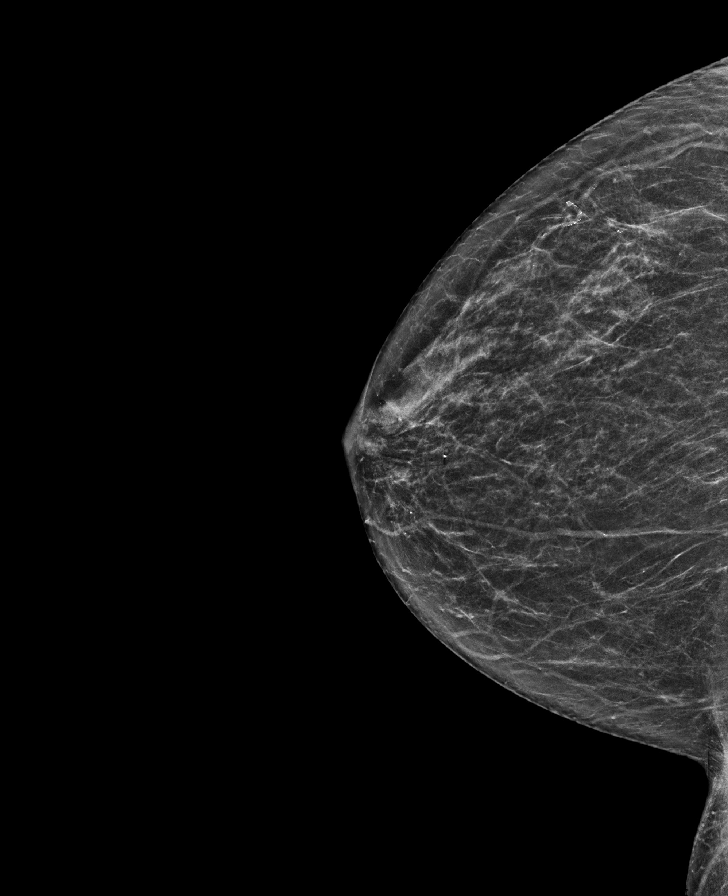

[L CC synth-2D]
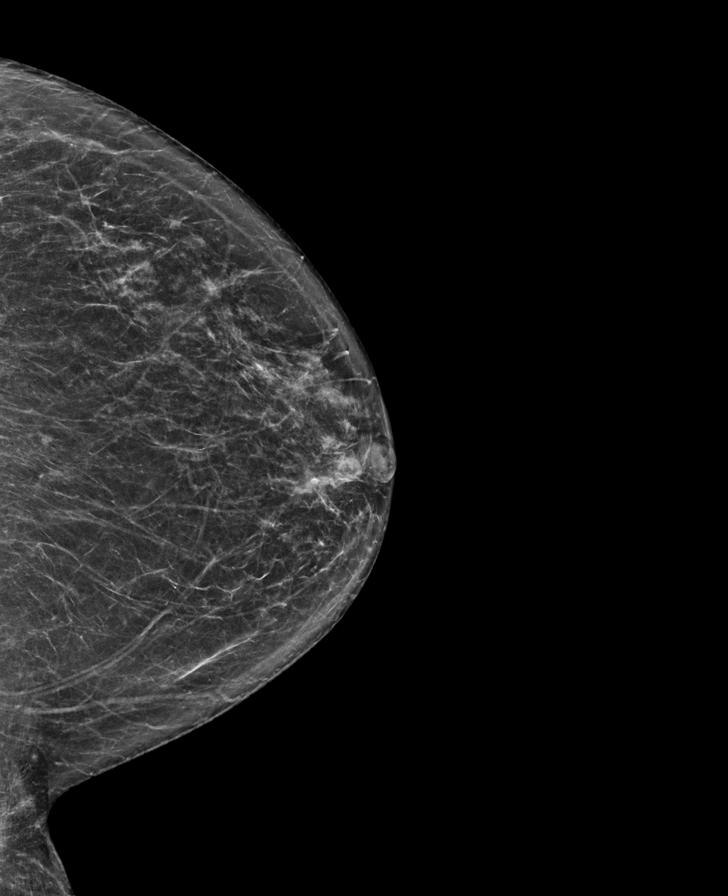

[R MLO tomo · tomo slice 32/63.0]
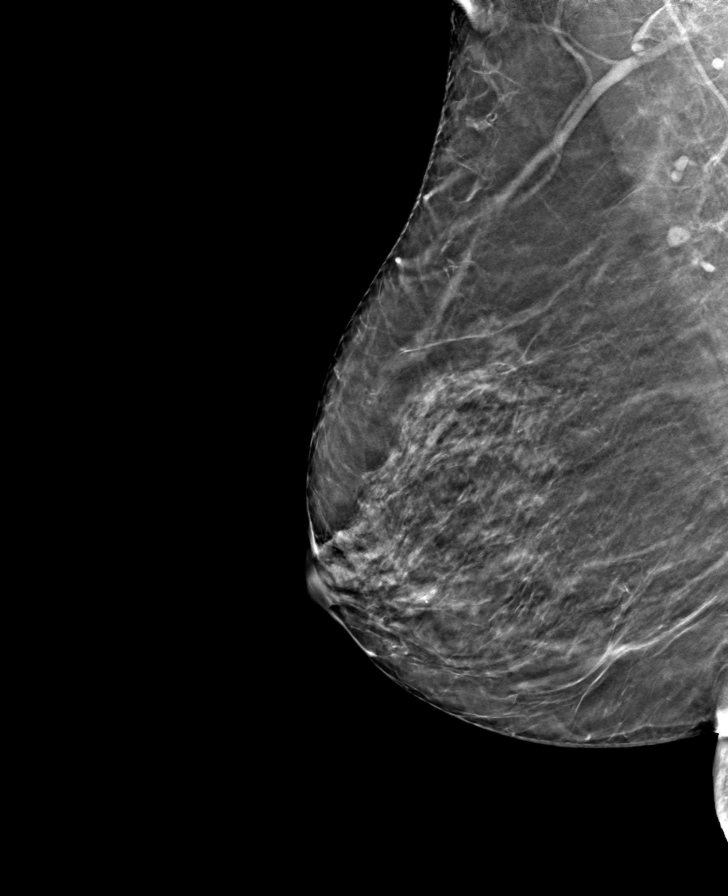

[L MLO tomo · tomo slice 35/70.0]
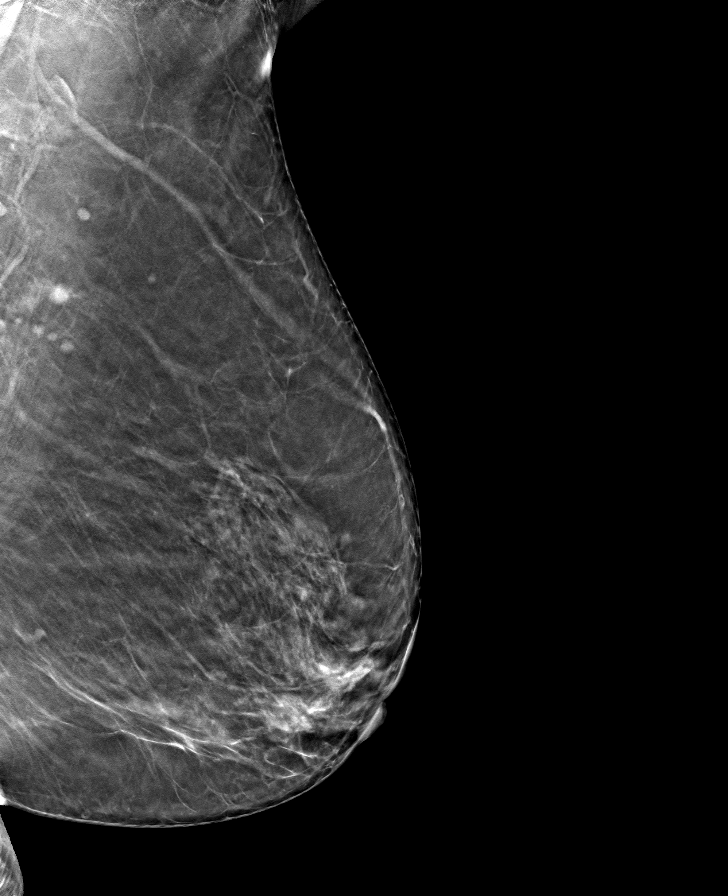

[R CC tomo · tomo slice 31/61.0]
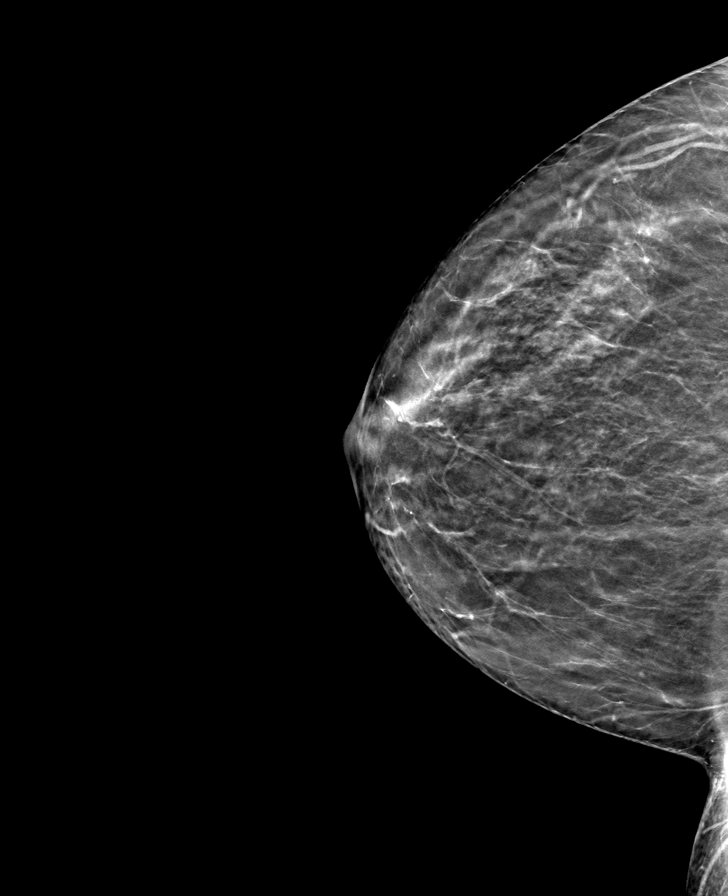

[L CC tomo · tomo slice 32/63.0]
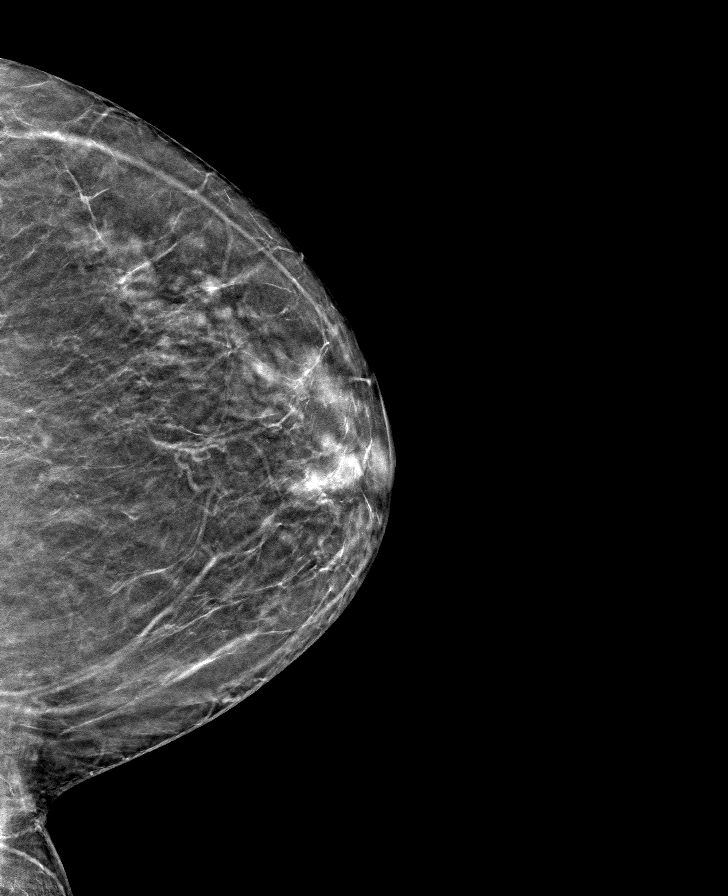

[8 of 24 positions shown; findings below may reference images not displayed]

ACR Breast Density Category b: There are scattered areas of
fibroglandular density.
FINDINGS: No suspicious calcifications, masses or areas of distortion are seen
in the bilateral breasts.

Mammographic images were processed with CAD.

On physical exam, no significant nipple retraction is noted. The
patient pointed out the bumps that she had noticed on her areola,
which correspond with normal appearing [REDACTED] glands. No
palpable masses are identified.

Targeted ultrasound is performed, showing normal fibroglandular
tissue in the retroareolar right breast. No suspicious masses or
areas of shadowing are identified.
IMPRESSION: 1. No suspicious mammographic or targeted sonographic abnormalities
are identified in the retroareolar right breast.

2.  No evidence of malignancy in the bilateral breasts.

RECOMMENDATION:
1.  Screening mammogram in one year.(Code:L7-R-V4A)

I have discussed the findings and recommendations with the patient.
If applicable, a reminder letter will be sent to the patient
regarding the next appointment.

BI-RADS CATEGORY  1: Negative.

## 2021-10-25 NOTE — Assessment & Plan Note (Signed)
On thyroid replacement.  Follow tsh.  

## 2021-10-25 NOTE — Assessment & Plan Note (Signed)
Continue blood pressure control - amlodipine.  Continue pravastatin.  

## 2021-10-25 NOTE — Assessment & Plan Note (Signed)
S/p mastectomy.  Prescribed arimidex.  Desires not to take. Followed by oncology.   

## 2021-10-25 NOTE — Assessment & Plan Note (Signed)
On pravastatin.  Low cholesterol diet and exercise.  Follow lipid panel and liver function tests.   

## 2021-10-25 NOTE — Assessment & Plan Note (Signed)
Continue lexapro.  Stable.  

## 2021-10-25 NOTE — Assessment & Plan Note (Addendum)
Blood pressure as outlined.  Elevated.  Restart amlodipine '5mg'$  q day.  Follow pressures.  ?

## 2021-10-25 NOTE — Assessment & Plan Note (Signed)
Follow met b and a1c.  ?

## 2021-10-25 NOTE — Assessment & Plan Note (Signed)
Persistent intermittent tremor noticed.  Request referral to neurology for further evaluation.  ?

## 2021-10-25 NOTE — Assessment & Plan Note (Signed)
Saw endocrinology.  Stable.  

## 2021-11-29 ENCOUNTER — Ambulatory Visit (INDEPENDENT_AMBULATORY_CARE_PROVIDER_SITE_OTHER): Payer: Medicare HMO | Admitting: Internal Medicine

## 2021-11-29 ENCOUNTER — Encounter: Payer: Self-pay | Admitting: Internal Medicine

## 2021-11-29 VITALS — BP 134/80 | HR 75 | Temp 98.1°F | Resp 14 | Ht 63.0 in | Wt 138.0 lb

## 2021-11-29 DIAGNOSIS — E039 Hypothyroidism, unspecified: Secondary | ICD-10-CM | POA: Diagnosis not present

## 2021-11-29 DIAGNOSIS — W19XXXA Unspecified fall, initial encounter: Secondary | ICD-10-CM

## 2021-11-29 DIAGNOSIS — I77810 Thoracic aortic ectasia: Secondary | ICD-10-CM | POA: Diagnosis not present

## 2021-11-29 DIAGNOSIS — F325 Major depressive disorder, single episode, in full remission: Secondary | ICD-10-CM | POA: Diagnosis not present

## 2021-11-29 DIAGNOSIS — E785 Hyperlipidemia, unspecified: Secondary | ICD-10-CM | POA: Diagnosis not present

## 2021-11-29 DIAGNOSIS — Z853 Personal history of malignant neoplasm of breast: Secondary | ICD-10-CM

## 2021-11-29 DIAGNOSIS — I10 Essential (primary) hypertension: Secondary | ICD-10-CM | POA: Diagnosis not present

## 2021-11-29 DIAGNOSIS — R350 Frequency of micturition: Secondary | ICD-10-CM

## 2021-11-29 DIAGNOSIS — R251 Tremor, unspecified: Secondary | ICD-10-CM

## 2021-11-29 DIAGNOSIS — G4733 Obstructive sleep apnea (adult) (pediatric): Secondary | ICD-10-CM

## 2021-11-29 DIAGNOSIS — R739 Hyperglycemia, unspecified: Secondary | ICD-10-CM | POA: Diagnosis not present

## 2021-11-29 NOTE — Progress Notes (Signed)
Patient ID: Jennifer Bernard, female   DOB: 1926/12/20, 88 y.o.   MRN: [id number] ? ? ?Subjective:  ? ? Patient ID: Jennifer Bernard, female    DOB: [birth year], 88 y.o.   MRN: [id number] ? ?This visit occurred during the SARS-CoV-2 public health emergency.  Safety protocols were in place, including screening questions prior to the visit, additional usage of staff PPE, and extensive cleaning of exam room while observing appropriate contact time as indicated for disinfecting solutions.  ? ?Patient here for a scheduled follow up.  ? ?Chief Complaint  ?Patient presents with  ? Follow-up  ?  6 wk f/u for HTN  ? .  ? ?HPI ?She is accompanied by her daughter.  History obtained from both of them.  Reviewed outside blood pressure readings - averaging 120-130s/70s.  No chest pain.  Breathing stable.  No increased cough or congestion.  Recently fell - stepped wrong off curb.  Hit left hand and knee. No residual pain.  No head injury.  No LOC.  Had requested referral to neurology for persistent tremor.  No appt yet.  Will f/u to get scheduled.  Being followed by oncology for history of breast cancer.  Reports increased fatigue.  Increased nocturia.  No increased urination during the day.  Discussed sleep apnea.  Discussed evaluation.  Agreeable.   ? ? ?Past Medical History:  ?Diagnosis Date  ? Asymptomatic menopausal state 02/20/1984  ? Overview:  Naturally occuring and asymptomatic Vaginal atrophy present on topical estrogen Overview:  Overview:  Naturally occuring and asymptomatic Vaginal atrophy present on topical estrogen  ? Basal cell carcinoma of skin 02/19/2013  ? Overview:  Basal cell on her nasolabial fold 2014 S/p Mohs by Dr. Lacinda Axon Followed by local dermatologist regularly Overview:  Overview:  Followed by Dr. Lacinda Axon  ? Cyst of ovary 02/19/2009  ? Overview:  Ovarian cyst on right stable 2004-2010 Cyst ? larger on CT 2012 Stable on follow up US Followed by Dr. Edwinna Areola Overview:  Overview:  Ovarian cyst on right stable  2004-2010 Cyst ? larger on CT 2012 Stable on follow up US Followed by Dr. Edwinna Areola  ? Depression, major, single episode, complete remission ([place]) 07/18/2017  ? Diffuse cystic mastopathy of breast 02/19/2013  ? Overview:  Mother with breast cancer later in life Two breast biopsies both with normal pathology Annual mammograms recommended, last 2016 Followed at the [facility]:  Relevant Hx: Course: Daily Update: Today's Plan: Overview:  Overview:  Mother with breast cancer later in life Two breast biopsies both with normal pathology Annual mammograms recommended, last 23  ? Essential (primary) hypertension 02/19/2013  ? Overview:  Goal blood pressure < 135/85 On atenolol Overview:  Overview:  Goal blood pressure < 135/85 On atenolol  ? Hyperlipidemia 02/19/2013  ? Overview:  Goal LDL < 130, last value 92 in 2017 On Pravachol Overview:  Overview:  Goal LDL < 130, last value 82 in 2012 On Pravachol  ? Hypothyroidism 02/28/2012  ? Overview:  started on T4  TFT normal 2017 Followed by Dr. Almon Hercules Overview:  Overview:  started on T4  TFT normal 2014 Followed by Dr. Almon Hercules  ? Impaired glucose tolerance 02/20/2003  ? Overview:  Overview:  Glucose goal < 100, last value 94 in 2014 HgbA1c goal < 6.0, last value 5.3 in 2014 Urine microalbumin negative in 2014 On diet and exercise  ? Nontoxic multinodular goiter 03/06/2003  ? Overview:  Asymptomatic nodular thyroid Stable  [place] [id number] Followed by Dr. Almon Hercules Overview:  Overview:  Asymptomatic nodular thyroid Stable [place] 2004-2014 Followed by Dr. Almon Hercules  ? OSA on CPAP 01/13/2015  ? Overview:  Sleep study sent to medical records:   Sleep efficiency 22%, latency 24 min, # awakenings 11, res desat N=10, index 17.8  Apnea 1 for 16 sec, hypopnea 16 for 18 sec, total sleep time only 90 min so index = 11 She did not sleep much and the index puts her in mild category Repeat study a few days later when she slept 187 min her AHI = 4.6 which is normal,  average O2 sat 94% I don't think   ? Osteoarthritis 02/20/2003  ? Overview:  Mild bilateral hands Right CMC Right hip Left knee s/p meniscus tear All symptomatically stable Overview:  Overview:  Mild bilateral hands Right CMC Right hip Left knee s/p meniscus tear All symptomatically stable  ? Osteoporosis 02/20/2003  ? Overview:  Bone density 2004 LS -3.32 and RH-1.62 Bone density 2008 LS -3.14 and RH -1.22 Bone density 2009 LS -3.17 and RH -1.21 Bone density 2010 LS -3.14 and RH -1.42  Bone density 2014 LS -2.7 (L3 -3.1) and RH -1.5 Bone density 2017 LS -2.3 and RH -1.4 (neck -1.7) Vitamin D level 38 in 2012 On Boniva 2004 to 2010, restarted 2015 Repeat bone density in 2020 and stop Boniva  Last Assessment & Pl  ? Prediabetes 02/20/2003  ? Overview:  Glucose goal < 100, last value 91 in 2017 HgbA1c goal < 6.0, last value 5.6 in 2017 Urine microalbumin negative in 2017 On diet and exercise  ? Recurrent UTI 10/19/2014  ? Squamous cell carcinoma of skin 03/14/2019  ? Left mid brow. SCCis, hypertrophic. EDC  ? ?Past Surgical History:  ?Procedure Laterality Date  ? ABDOMINAL HYSTERECTOMY  1975  ? MASTECTOMY Left   ? URETEROSCOPY    ? URETEROSCOPY WITH HOLMIUM LASER LITHOTRIPSY    ? ?Family History  ?Problem Relation Age of Onset  ? Breast cancer Mother   ? Skin cancer Father   ? ?Social History  ? ?Socioeconomic History  ? Marital status: Widowed  ?  Spouse name: Not on file  ? Number of children: Not on file  ? Years of education: Not on file  ? Highest education level: Not on file  ?Occupational History  ? Not on file  ?Tobacco Use  ? Smoking status: Never  ? Smokeless tobacco: Never  ?Vaping Use  ? Vaping Use: Never used  ?Substance and Sexual Activity  ? Alcohol use: No  ? Drug use: No  ? Sexual activity: Not Currently  ?  Birth control/protection: Post-menopausal  ?Other Topics Concern  ? Not on file  ?Social History Narrative  ? Not on file  ? ?Social Determinants of Health  ? ?Financial Resource Strain: Low Risk   ?  Difficulty of Paying Living Expenses: Not hard at all  ?Food Insecurity: No Food Insecurity  ? Worried About Charity fundraiser in the Last Year: Never true  ? Ran Out of Food in the Last Year: Never true  ?Transportation Needs: No Transportation Needs  ? Lack of Transportation (Medical): No  ? Lack of Transportation (Non-Medical): No  ?Physical Activity: Sufficiently Active  ? Days of Exercise per Week: 5 days  ? Minutes of Exercise per Session: 30 min  ?Stress: No Stress Concern Present  ? Feeling of Stress : Not at all  ?Social Connections: Unknown  ? Frequency of Communication with Friends  and Family: More than three times a week  ? Frequency of Social Gatherings with Friends and Family: More than three times a week  ? Attends Religious Services: Not on file  ? Active Member of Clubs or Organizations: Not on file  ? Attends Archivist Meetings: Not on file  ? Marital Status: Widowed  ? ? ? ?Review of Systems  ?Constitutional:  Positive for fatigue. Negative for appetite change and unexpected weight change.  ?HENT:  Negative for congestion and sinus pressure.   ?Respiratory:  Negative for cough, chest tightness and shortness of breath.   ?Cardiovascular:  Negative for chest pain, palpitations and leg swelling.  ?Gastrointestinal:  Negative for abdominal pain, diarrhea, nausea and vomiting.  ?Genitourinary:  Negative for difficulty urinating and dysuria.  ?Musculoskeletal:  Negative for joint swelling and myalgias.  ?Skin:  Negative for color change and rash.  ?Neurological:  Negative for dizziness, light-headedness and headaches.  ?Psychiatric/Behavioral:  Negative for agitation and dysphoric mood.   ? ?   ?Objective:  ?  ? ?BP 134/80 (BP Location: Left Arm, Patient Position: Sitting, Cuff Size: Small)   Pulse 75   Temp 98.1 ?F (36.7 ?C) (Temporal)   Resp 14   Ht '5\' 3"'$  (1.6 m)   Wt 138 lb (62.6 kg)   SpO2 97%   BMI 24.45 kg/m?  ?Wt Readings from Last 3 Encounters:  ?11/29/21 138 lb (62.6 kg)   ?10/18/21 140 lb (63.5 kg)  ?07/22/21 138 lb 6.4 oz (62.8 kg)  ? ? ?Physical Exam ?Vitals reviewed.  ?Constitutional:   ?   General: She is not in acute distress. ?   Appearance: Normal appearance.  ?HENT:  ?   Head: Nor

## 2021-12-01 DIAGNOSIS — Z9012 Acquired absence of left breast and nipple: Secondary | ICD-10-CM | POA: Diagnosis not present

## 2021-12-01 DIAGNOSIS — N6489 Other specified disorders of breast: Secondary | ICD-10-CM | POA: Diagnosis not present

## 2021-12-01 DIAGNOSIS — Z9189 Other specified personal risk factors, not elsewhere classified: Secondary | ICD-10-CM | POA: Diagnosis not present

## 2021-12-01 DIAGNOSIS — C50112 Malignant neoplasm of central portion of left female breast: Secondary | ICD-10-CM | POA: Diagnosis not present

## 2021-12-01 DIAGNOSIS — Z170 Estrogen receptor positive status [ER+]: Secondary | ICD-10-CM | POA: Diagnosis not present

## 2021-12-04 ENCOUNTER — Encounter: Payer: Self-pay | Admitting: Internal Medicine

## 2021-12-04 DIAGNOSIS — W19XXXA Unspecified fall, initial encounter: Secondary | ICD-10-CM | POA: Insufficient documentation

## 2021-12-04 NOTE — Assessment & Plan Note (Signed)
Persistent intermittent tremor noticed.  Request referral to neurology for further evaluation.  Referral placed last visit.  Follow up on appt.   ?

## 2021-12-04 NOTE — Assessment & Plan Note (Signed)
Continue blood pressure control - amlodipine.  Continue pravastatin.  

## 2021-12-04 NOTE — Assessment & Plan Note (Signed)
On pravastatin.  Low cholesterol diet and exercise.  Follow lipid panel and liver function tests.   

## 2021-12-04 NOTE — Assessment & Plan Note (Signed)
Continue lexapro.  Stable.  

## 2021-12-04 NOTE — Assessment & Plan Note (Signed)
Follow met b and a1c.  ?

## 2021-12-04 NOTE — Assessment & Plan Note (Signed)
History of sleep apnea.  Previously used cpap.  With increased fatigue and nocturia, discussed need for reevaluation.  Refer to pulmonary for further evaluation and question of need for HST.  ?

## 2021-12-04 NOTE — Assessment & Plan Note (Signed)
S/p mastectomy.  Prescribed arimidex.  Desires not to take. Followed by oncology.   

## 2021-12-04 NOTE — Assessment & Plan Note (Signed)
On amlodipine.  Blood pressure as outlined.  No changes made in medication.  Follow pressures.  Follow metabolic panel.  

## 2021-12-04 NOTE — Assessment & Plan Note (Signed)
Recent fall.  No residual pain.  No head injury.  Follow.  ?

## 2021-12-04 NOTE — Assessment & Plan Note (Signed)
On thyroid replacement.  Follow tsh.  

## 2022-01-13 DIAGNOSIS — R8290 Unspecified abnormal findings in urine: Secondary | ICD-10-CM | POA: Diagnosis not present

## 2022-01-13 DIAGNOSIS — R350 Frequency of micturition: Secondary | ICD-10-CM | POA: Diagnosis not present

## 2022-01-19 ENCOUNTER — Other Ambulatory Visit: Payer: Self-pay | Admitting: Internal Medicine

## 2022-02-07 DIAGNOSIS — R251 Tremor, unspecified: Secondary | ICD-10-CM | POA: Diagnosis not present

## 2022-02-07 DIAGNOSIS — G43109 Migraine with aura, not intractable, without status migrainosus: Secondary | ICD-10-CM | POA: Diagnosis not present

## 2022-02-07 DIAGNOSIS — G4733 Obstructive sleep apnea (adult) (pediatric): Secondary | ICD-10-CM | POA: Diagnosis not present

## 2022-02-07 DIAGNOSIS — R2689 Other abnormalities of gait and mobility: Secondary | ICD-10-CM | POA: Diagnosis not present

## 2022-02-07 DIAGNOSIS — E559 Vitamin D deficiency, unspecified: Secondary | ICD-10-CM | POA: Diagnosis not present

## 2022-02-08 ENCOUNTER — Other Ambulatory Visit: Payer: Self-pay | Admitting: Neurology

## 2022-02-08 DIAGNOSIS — G252 Other specified forms of tremor: Secondary | ICD-10-CM

## 2022-02-16 ENCOUNTER — Ambulatory Visit
Admission: RE | Admit: 2022-02-16 | Discharge: 2022-02-16 | Disposition: A | Discharge status: Home / Self Care | Payer: Medicare HMO | Source: Ambulatory Visit | Attending: Neurology | Admitting: Neurology

## 2022-02-16 DIAGNOSIS — G319 Degenerative disease of nervous system, unspecified: Secondary | ICD-10-CM | POA: Diagnosis not present

## 2022-02-16 DIAGNOSIS — R251 Tremor, unspecified: Secondary | ICD-10-CM | POA: Diagnosis not present

## 2022-02-16 DIAGNOSIS — G252 Other specified forms of tremor: Secondary | ICD-10-CM

## 2022-02-16 DIAGNOSIS — I6782 Cerebral ischemia: Secondary | ICD-10-CM | POA: Diagnosis not present

## 2022-02-16 DIAGNOSIS — J341 Cyst and mucocele of nose and nasal sinus: Secondary | ICD-10-CM | POA: Diagnosis not present

## 2022-03-07 NOTE — Progress Notes (Unsigned)
03/08/2022 2:37 PM   Jennifer Bernard [birth year] [id number]  Referring provider: [facility], [address] [place],  [place] [address]  Urological history: 1. Nocturia -contributing risk factors of obstructive sleep apnea, hypertension, diabetes, arthritis, asthma, anxiety and depression -PVR *** -failed trospium IR 20 mg qhs  2.  Nephrolithiasis -Left URS, 2016  3. rUTI's -Contributing factors of age, vaginal atrophy, diabetes -documented positive urine cultures over the last year  05/05/2021 - e.coli   No chief complaint on file.   HPI: Jennifer Bernard is a 88 y.o. female who presents today for nocturia and frequency.  PVR***    PMH: Past Medical History:  Diagnosis Date   Asymptomatic menopausal state 02/20/1984   Overview:  Naturally occuring and asymptomatic Vaginal atrophy present on topical estrogen Overview:  Overview:  Naturally occuring and asymptomatic Vaginal atrophy present on topical estrogen   Basal cell carcinoma of skin 02/19/2013   Overview:  Basal cell on her nasolabial fold 2014 S/p Mohs by Dr. Lacinda Axon Followed by local dermatologist regularly Overview:  Overview:  Followed by Dr. Lacinda Axon   Cyst of ovary 02/19/2009   Overview:  Ovarian cyst on right stable 2004-2010 Cyst ? larger on CT 2012 Stable on follow up US Followed by Dr. Edwinna Areola Overview:  Overview:  Ovarian cyst on right stable 2004-2010 Cyst ? larger on CT 2012 Stable on follow up US Followed by Dr. Edwinna Areola   Depression, major, single episode, complete remission ([place]) 07/18/2017   Diffuse cystic mastopathy of breast 02/19/2013   Overview:  Mother with breast cancer later in life Two breast biopsies both with normal pathology Annual mammograms recommended, last 2016 Followed at the [facility]:  Relevant Hx: Course: Daily Update: Today's Plan: Overview:  Overview:  Mother with breast cancer later in life Two breast biopsies  both with normal pathology Annual mammograms recommended, last 201   Essential (primary) hypertension 02/19/2013   Overview:  Goal blood pressure < 135/85 On atenolol Overview:  Overview:  Goal blood pressure < 135/85 On atenolol   Hyperlipidemia 02/19/2013   Overview:  Goal LDL < 130, last value 92 in 2017 On Pravachol Overview:  Overview:  Goal LDL < 130, last value 82 in 2012 On Pravachol   Hypothyroidism 02/28/2012   Overview:  started on T4  TFT normal 2017 Followed by Dr. Almon Hercules Overview:  Overview:  started on T4  TFT normal 2014 Followed by Dr. Almon Hercules   Impaired glucose tolerance 02/20/2003   Overview:  Overview:  Glucose goal < 100, last value 94 in 2014 HgbA1c goal < 6.0, last value 5.3 in 2014 Urine microalbumin negative in 2014 On diet and exercise   Nontoxic multinodular goiter 03/06/2003   Overview:  Asymptomatic nodular thyroid Stable [place] 2004-2014 Followed by Dr. Almon Hercules Overview:  Overview:  Asymptomatic nodular thyroid Stable [place] 2004-2014 Followed by Dr. Almon Hercules   OSA on CPAP 01/13/2015   Overview:  Sleep study sent to medical records:   Sleep efficiency 22%, latency 24 min, # awakenings 11, res desat N=10, index 17.8  Apnea 1 for 16 sec, hypopnea 16 for 18 sec, total sleep time only 90 min so index = 11 She did not sleep much and the index puts her in mild category Repeat study a few days later when she slept 187 min her AHI = 4.6 which is normal, average O2 sat 94% I don't think    Osteoarthritis 02/20/2003  Overview:  Mild bilateral hands Right CMC Right hip Left knee s/p meniscus tear All symptomatically stable Overview:  Overview:  Mild bilateral hands Right CMC Right hip Left knee s/p meniscus tear All symptomatically stable   Osteoporosis 02/20/2003   Overview:  Bone density 2004 LS -3.32 and RH-1.62 Bone density 2008 LS -3.14 and RH -1.22 Bone density 2009 LS -3.17 and RH -1.21 Bone density 2010 LS -3.14 and RH -1.42  Bone density 2014 LS -2.7 (L3 -3.1) and RH -1.5 Bone density  2017 LS -2.3 and RH -1.4 (neck -1.7) Vitamin D level 38 in 2012 On Boniva 2004 to 2010, restarted 2015 Repeat bone density in 2020 and stop Boniva  Last Assessment & Pl   Prediabetes 02/20/2003   Overview:  Glucose goal < 100, last value 91 in 2017 HgbA1c goal < 6.0, last value 5.6 in 2017 Urine microalbumin negative in 2017 On diet and exercise   Recurrent UTI 10/19/2014   Squamous cell carcinoma of skin 03/14/2019   Left mid brow. SCCis, hypertrophic. EDC    Surgical History: Past Surgical History:  Procedure Laterality Date   ABDOMINAL HYSTERECTOMY  1975   MASTECTOMY Left    URETEROSCOPY     URETEROSCOPY WITH HOLMIUM LASER LITHOTRIPSY      Home Medications:  Allergies as of 03/08/2022       Reactions   Penicillins Hives   Other reaction(s): Unknown   Ceftin [cefuroxime Axetil] Diarrhea   GI distress   Ramipril    Other reaction(s): Cough Other reaction(s): Cough        Medication List        Accurate as of March 07, 2022  2:37 PM. If you have any questions, ask your nurse or doctor.          amLODipine 5 MG tablet Commonly known as: NORVASC TAKE 1 TABLET BY MOUTH DAILY   escitalopram 10 MG tablet Commonly known as: LEXAPRO TAKE 1 TABLET BY MOUTH DAILY (FOR MOOD/DEPRESSION)   famotidine 20 MG tablet Commonly known as: PEPCID TAKE 1 TABLET BY MOUTH DAILY   levothyroxine 75 MCG tablet Commonly known as: SYNTHROID   pravastatin 40 MG tablet Commonly known as: PRAVACHOL TAKE ONE TABLET EACH EVENING   vitamin B-12 1000 MCG tablet Commonly known as: CYANOCOBALAMIN Take 1 tablet (1,000 mcg total) by mouth daily.        Allergies:  Allergies  Allergen Reactions   Penicillins Hives    Other reaction(s): Unknown   Ceftin [Cefuroxime Axetil] Diarrhea    GI distress   Ramipril     Other reaction(s): Cough Other reaction(s): Cough     Family History: Family History  Problem Relation Age of Onset   Breast cancer Mother    Skin cancer Father      Social History:  reports that she has never smoked. She has never used smokeless tobacco. She reports that she does not drink alcohol and does not use drugs.  ROS: Pertinent ROS in HPI  Physical Exam: There were no vitals taken for this visit.  Constitutional:  Well nourished. Alert and oriented, No acute distress. HEENT: [place] AT, moist mucus membranes.  Trachea midline, no masses. Cardiovascular: No clubbing, cyanosis, or edema. Respiratory: Normal respiratory effort, no increased work of breathing. GI: Abdomen is soft, non tender, non distended, no abdominal masses. Liver and spleen not palpable.  No hernias appreciated.  Stool sample for occult testing is not indicated.   GU: No CVA tenderness.  No bladder fullness or  masses.  *** external genitalia, *** pubic hair distribution, no lesions.  Normal urethral meatus, no lesions, no prolapse, no discharge.   No urethral masses, tenderness and/or tenderness. No bladder fullness, tenderness or masses. *** vagina mucosa, *** estrogen effect, no discharge, no lesions, *** pelvic support, *** cystocele and *** rectocele noted.  No cervical motion tenderness.  Uterus is freely mobile and non-fixed.  No adnexal/parametria masses or tenderness noted.  Anus and perineum are without rashes or lesions.   ***  Skin: No rashes, bruises or suspicious lesions. Lymph: No cervical or inguinal adenopathy. Neurologic: Grossly intact, no focal deficits, moving all 4 extremities. Psychiatric: Normal mood and affect.    Laboratory Data: WBC (White Blood Cell Count) 3.2 - 9.8 x10^9/L 8.5   Hemoglobin 12.0 - 15.5 g/dL 14.2   Hematocrit 35.0 - 45.0 % 43.8   Platelets 150 - 450 x10^9/L 241   MCV (Mean Corpuscular Volume) 80 - 98 fL 91   MCH (Mean Corpuscular Hemoglobin) 26.5 - 34.0 pg 29.5   MCHC (Mean Corpuscular Hemoglobin Concentration) 31.4 - 36.0 % 32.4   RBC (Red Blood Cell Count) 3.77 - 5.16 x10^12/L 4.81   RDW-CV (Red Cell Distribution Width) 11.5 -  14.5 % 14.5   NRBC (Nucleated Red Blood Cell Count) 0 x10^9/L 0.00   NRBC % (Nucleated Red Blood Cell %) % 0.0   MPV (Mean Platelet Volume) 7.2 - 11.7 fL 9.7   Resulting Agency  [facility]   Specimen Collected: 08/18/21 09:52 Last Resulted: 08/18/21 10:14  Received From: [facility]  Result Received: 10/25/21 20:54   Sodium 135 - 145 mmol/L 139   Potassium 3.5 - 5.0 mmol/L 3.7   Chloride 98 - 108 mmol/L 102   Carbon Dioxide (CO2) 21 - 30 mmol/L 26   Urea Nitrogen (BUN) 7 - 20 mg/dL 14   Creatinine 0.4 - 1.0 mg/dL 1.0   Glucose 70 - 140 mg/dL 100   Comment: Interpretive Data:  Above is the NONFASTING reference range.   Below are the FASTING reference ranges:  NORMAL:      70-99 mg/dL  PREDIABETES: 100-125 mg/dL  DIABETES:    > 125 mg/dL   Calcium 8.7 - 10.2 mg/dL 9.4   AST (Aspartate Aminotransferase) 15 - 41 U/L 25   ALT (Alanine Aminotransferase) 14 - 54 U/L 20   Bilirubin, Total 0.4 - 1.5 mg/dL 1.1   Alk Phos (Alkaline Phosphatase) 24 - 110 U/L 63   Albumin 3.5 - 4.8 g/dL 3.8   Protein, Total 6.2 - 8.1 g/dL 6.5   Anion Gap 3 - 12 mmol/L 11   BUN/CREA Ratio 6 - 27 14   Glomerular Filtration Rate (eGFR)  mL/min/1.73sq m 53   Comment: CKD-EPI (2021) does not include patient's race in the calculation of eGFR. Monitoring changes of plasma creatinine and eGFR over time is useful for monitoring kidney function.  This change was made on 10/13/2020.   Interpretive Ranges for eGFR(CKD-EPI 2021):   eGFR:              > 60 mL/min/1.73 sq m - Normal  eGFR:              30 - 59 mL/min/1.73 sq m - Moderately Decreased  eGFR:              15 - 29 mL/min/1.73 sq m - Severely Decreased  eGFR:              <  15 mL/min/1.73 sq m -  Kidney Failure    Note: These eGFR calculations do not apply in acute situations  when eGFR is changing rapidly or in patients on dialysis.   Resulting Agency  [facility]   Specimen Collected: 05/19/21  08:33 Last Resulted: 05/19/21 10:33  Received From: [facility]  Result Received: 05/19/21 11:31   Color Colorless, Straw, Light Yellow, Yellow, Dark Yellow Yellow   Clarity Clear Clear   Specific Gravity 1.005 - 1.030 1.017   pH, Urine 5.0 - 8.0 5.5   Protein, Urinalysis Negative Trace Abnormal    Glucose, Urinalysis Negative Negative   Ketones, Urinalysis Negative Negative   Blood, Urinalysis Negative Negative   Nitrite, Urinalysis Negative Negative   Leukocytes, Urinalysis Negative 1+ Abnormal    Bilirubin, Urinalysis Negative Negative   Urobilinogen, Urinalysis 0.2 - 1.0 mg/dL 0.2   Red Blood Cells, Urinalysis <=3 /hpf 0   WBC, UA <=5 /hpf 5   Squamous Epithelial Cells, Urinalysis /hpf 0   Hyaline Casts /lpf 2   Comment: Occasional hyaline casts may be found in normal urine.  Resulting Agency  [facility]  Narrative Performed by [facility] Reporting of microscopic urinalysis (UA) bacteria and yeast results was discontinued on August 30th, 2021 due to poor clinical predictive value for urinary tract infection (UTI). Providers should use other clinical signs and symptoms to diagnose UTI including urine leukocyte esterase, white blood cell count, and culture in accordance with local and national guidelines. Additional information and rationale can obtained by emailing [email or web address].  Specimen Collected: 05/19/21 08:33 Last Resulted: 05/19/21 10:43  Received From: [facility]  Result Received: 07/12/21 11:11   Hemoglobin A1C <6.5 % 5.4   Average Blood Glucose (Calculated From HgBA1c Level) mg/dL 105   Resulting Agency  [facility]  Narrative Performed by [facility] The [employer] has made the following Recommendations:  HBA1C results between 5.7% and 6.4% are suggestive of prediabetes  HBA1C result greater than or equal to 6.5% are diagnostic of  diabetes  Specimen Collected: 05/19/21 08:33 Last Resulted: 05/19/21 10:52  Received From: [facility]  Result Received: 07/12/21 11:11  I have reviewed the labs.    Pertinent Imaging: ***  Assessment & Plan:  ***  1. Nocturia ***    No follow-ups on file.  These notes generated with voice recognition software. I apologize for typographical errors.  Zara Council, PA-C  [facility] [address]  [place], [place] [address] [phone]

## 2022-03-08 ENCOUNTER — Encounter: Payer: Self-pay | Admitting: Urology

## 2022-03-08 ENCOUNTER — Ambulatory Visit (INDEPENDENT_AMBULATORY_CARE_PROVIDER_SITE_OTHER): Payer: Medicare HMO | Admitting: Urology

## 2022-03-08 VITALS — BP 153/88 | HR 76 | Ht 62.0 in | Wt 137.0 lb

## 2022-03-08 DIAGNOSIS — R351 Nocturia: Secondary | ICD-10-CM

## 2022-03-08 LAB — BLADDER SCAN AMB NON-IMAGING

## 2022-03-08 MED ORDER — TROSPIUM CHLORIDE 20 MG PO TABS: 20.0000 mg | ORAL_TABLET | Freq: Two times a day (BID) | ORAL | 0 refills | Status: DC

## 2022-03-22 ENCOUNTER — Other Ambulatory Visit (INDEPENDENT_AMBULATORY_CARE_PROVIDER_SITE_OTHER): Payer: Medicare HMO

## 2022-03-22 ENCOUNTER — Other Ambulatory Visit: Payer: Self-pay | Admitting: Internal Medicine

## 2022-03-22 DIAGNOSIS — I10 Essential (primary) hypertension: Secondary | ICD-10-CM

## 2022-03-22 DIAGNOSIS — E785 Hyperlipidemia, unspecified: Secondary | ICD-10-CM

## 2022-03-22 DIAGNOSIS — R739 Hyperglycemia, unspecified: Secondary | ICD-10-CM

## 2022-03-22 LAB — BASIC METABOLIC PANEL
BUN: 15 mg/dL (ref 6–23)
CO2: 28 mEq/L (ref 19–32)
Calcium: 9.4 mg/dL (ref 8.4–10.5)
Chloride: 105 mEq/L (ref 96–112)
Creatinine, Ser: 0.79 mg/dL (ref 0.40–1.20)
GFR: 64.67 mL/min (ref >60.00)
Glucose, Bld: 99 mg/dL (ref 70–99)
Potassium: 4 mEq/L (ref 3.5–5.1)
Sodium: 144 mEq/L (ref 135–145)

## 2022-03-22 LAB — CBC WITH DIFFERENTIAL/PLATELET
Basophils Absolute: 0.1 10*3/uL (ref 0.0–0.1)
Basophils Relative: 1.2 % (ref 0.0–3.0)
Eosinophils Absolute: 0.1 10*3/uL (ref 0.0–0.7)
Eosinophils Relative: 1.3 % (ref 0.0–5.0)
HCT: 46 % (ref 36.0–46.0)
Hemoglobin: 15.1 g/dL — ABNORMAL HIGH (ref 12.0–15.0)
Lymphocytes Relative: 25.9 % (ref 12.0–46.0)
Lymphs Abs: 1.8 10*3/uL (ref 0.7–4.0)
MCHC: 32.9 g/dL (ref 30.0–36.0)
MCV: 91.4 fl (ref 78.0–100.0)
Monocytes Absolute: 0.7 10*3/uL (ref 0.1–1.0)
Monocytes Relative: 10.8 % (ref 3.0–12.0)
Neutro Abs: 4.2 10*3/uL (ref 1.4–7.7)
Neutrophils Relative %: 60.8 % (ref 43.0–77.0)
Platelets: 236 10*3/uL (ref 150.0–400.0)
RBC: 5.03 Mil/uL (ref 3.87–5.11)
RDW: 14.6 % (ref 11.5–15.5)
WBC: 6.9 10*3/uL (ref 4.0–10.5)

## 2022-03-22 LAB — HEPATIC FUNCTION PANEL
ALT: 11 U/L (ref 0–35)
AST: 19 U/L (ref 0–37)
Albumin: 4.2 g/dL (ref 3.5–5.2)
Alkaline Phosphatase: 59 U/L (ref 39–117)
Bilirubin, Direct: 0.2 mg/dL (ref 0.0–0.3)
Total Bilirubin: 0.9 mg/dL (ref 0.2–1.2)
Total Protein: 6.7 g/dL (ref 6.0–8.3)

## 2022-03-22 LAB — LIPID PANEL
Cholesterol: 185 mg/dL (ref 0–200)
HDL: 79.9 mg/dL (ref >39.00)
LDL Cholesterol: 80 mg/dL (ref 0–99)
NonHDL: 104.8
Total CHOL/HDL Ratio: 2
Triglycerides: 122 mg/dL (ref 0.0–149.0)
VLDL: 24.4 mg/dL (ref 0.0–40.0)

## 2022-03-22 LAB — HEMOGLOBIN A1C: Hgb A1c MFr Bld: 5.7 % (ref 4.6–6.5)

## 2022-03-22 LAB — TSH: TSH: 2.32 u[IU]/mL (ref 0.35–5.50)

## 2022-03-24 ENCOUNTER — Ambulatory Visit (INDEPENDENT_AMBULATORY_CARE_PROVIDER_SITE_OTHER): Payer: Medicare HMO | Admitting: Internal Medicine

## 2022-03-24 ENCOUNTER — Encounter: Payer: Self-pay | Admitting: Internal Medicine

## 2022-03-24 VITALS — BP 128/78 | HR 85 | Temp 97.4°F | Resp 17 | Ht 62.0 in | Wt 137.2 lb

## 2022-03-24 DIAGNOSIS — G4733 Obstructive sleep apnea (adult) (pediatric): Secondary | ICD-10-CM | POA: Diagnosis not present

## 2022-03-24 DIAGNOSIS — R251 Tremor, unspecified: Secondary | ICD-10-CM

## 2022-03-24 DIAGNOSIS — Z853 Personal history of malignant neoplasm of breast: Secondary | ICD-10-CM

## 2022-03-24 DIAGNOSIS — E785 Hyperlipidemia, unspecified: Secondary | ICD-10-CM | POA: Diagnosis not present

## 2022-03-24 DIAGNOSIS — I77810 Thoracic aortic ectasia: Secondary | ICD-10-CM

## 2022-03-24 DIAGNOSIS — Z0000 Encounter for general adult medical examination without abnormal findings: Secondary | ICD-10-CM | POA: Diagnosis not present

## 2022-03-24 DIAGNOSIS — I10 Essential (primary) hypertension: Secondary | ICD-10-CM | POA: Diagnosis not present

## 2022-03-24 DIAGNOSIS — E039 Hypothyroidism, unspecified: Secondary | ICD-10-CM

## 2022-03-24 DIAGNOSIS — F325 Major depressive disorder, single episode, in full remission: Secondary | ICD-10-CM | POA: Diagnosis not present

## 2022-03-24 DIAGNOSIS — R739 Hyperglycemia, unspecified: Secondary | ICD-10-CM

## 2022-03-24 NOTE — Progress Notes (Signed)
Patient ID: Jennifer Bernard, female   DOB: 1927-03-05, 88 y.o.   MRN: [id number]   Subjective:    Patient ID: Jennifer Bernard, female    DOB: 20-Sep-1926, 88 y.o.   MRN: [id number]   Patient here for her physical exam.   Chief Complaint  Patient presents with   Follow-up    Yearly CPE   .   HPI She is accompanied by her daughter.  History obtained from both of them. Reports she is doing relatively well. Some fatigue.  Discussed.  With nocturia.  Seeing urology. Placed her on trospium.  They also discussed adjusting wine intake at night.  I also discussed with her regarding the possibility of sleep apnea and reevaluation.  Recently saw Dr Manuella Ghazi - tremor of left hand.  Ordered MRI brain - Stable extensive microvascular ischemic and metabolic changes and moderate cerebral atrophy. Labs ordered.  Overall feels stable.  No chest pain.  Breathing stable.  No increased cough or congestion.  Eating.  No vomiting reported.  Saw oncology for f/u breast cancer 11/2021.     Past Medical History:  Diagnosis Date   Asymptomatic menopausal state 02/20/1984   Overview:  Naturally occuring and asymptomatic Vaginal atrophy present on topical estrogen Overview:  Overview:  Naturally occuring and asymptomatic Vaginal atrophy present on topical estrogen   Basal cell carcinoma of skin 02/19/2013   Overview:  Basal cell on her nasolabial fold 2014 S/p Mohs by Dr. Lacinda Axon Followed by local dermatologist regularly Overview:  Overview:  Followed by Dr. Lacinda Axon   Cyst of ovary 02/19/2009   Overview:  Ovarian cyst on right stable 2004-2010 Cyst ? larger on CT 2012 Stable on follow up US Followed by Dr. Edwinna Areola Overview:  Overview:  Ovarian cyst on right stable 2004-2010 Cyst ? larger on CT 2012 Stable on follow up US Followed by Dr. Edwinna Areola   Depression, major, single episode, complete remission ([place]) 07/18/2017   Diffuse cystic mastopathy of breast 02/19/2013   Overview:  Mother with breast cancer later in life Two  breast biopsies both with normal pathology Annual mammograms recommended, last 2016 Followed at the [facility]:  Relevant Hx: Course: Daily Update: Today's Plan: Overview:  Overview:  Mother with breast cancer later in life Two breast biopsies both with normal pathology Annual mammograms recommended, last 201   Essential (primary) hypertension 02/19/2013   Overview:  Goal blood pressure < 135/85 On atenolol Overview:  Overview:  Goal blood pressure < 135/85 On atenolol   Hyperlipidemia 02/19/2013   Overview:  Goal LDL < 130, last value 92 in 2017 On Pravachol Overview:  Overview:  Goal LDL < 130, last value 82 in 2012 On Pravachol   Hypothyroidism 02/28/2012   Overview:  started on T4  TFT normal 2017 Followed by Dr. Almon Hercules Overview:  Overview:  started on T4  TFT normal 2014 Followed by Dr. Almon Hercules   Impaired glucose tolerance 02/20/2003   Overview:  Overview:  Glucose goal < 100, last value 94 in 2014 HgbA1c goal < 6.0, last value 5.3 in 2014 Urine microalbumin negative in 2014 On diet and exercise   Nontoxic multinodular goiter 03/06/2003   Overview:  Asymptomatic nodular thyroid Stable [place] 2004-2014 Followed by Dr. Almon Hercules Overview:  Overview:  Asymptomatic nodular thyroid Stable [place] 2004-2014 Followed by Dr. Almon Hercules   OSA on CPAP 01/13/2015   Overview:  Sleep study sent to medical records:   Sleep efficiency 22%, latency 24 min, #  awakenings 11, res desat N=10, index 17.8  Apnea 1 for 16 sec, hypopnea 16 for 18 sec, total sleep time only 90 min so index = 11 She did not sleep much and the index puts her in mild category Repeat study a few days later when she slept 187 min her AHI = 4.6 which is normal, average O2 sat 94% I don't think    Osteoarthritis 02/20/2003   Overview:  Mild bilateral hands Right CMC Right hip Left knee s/p meniscus tear All symptomatically stable Overview:  Overview:  Mild bilateral hands Right CMC Right hip Left knee s/p meniscus tear All  symptomatically stable   Osteoporosis 02/20/2003   Overview:  Bone density 2004 LS -3.32 and RH-1.62 Bone density 2008 LS -3.14 and RH -1.22 Bone density 2009 LS -3.17 and RH -1.21 Bone density 2010 LS -3.14 and RH -1.42  Bone density 2014 LS -2.7 (L3 -3.1) and RH -1.5 Bone density 2017 LS -2.3 and RH -1.4 (neck -1.7) Vitamin D level 38 in 2012 On Boniva 2004 to 2010, restarted 2015 Repeat bone density in 2020 and stop Boniva  Last Assessment & Pl   Prediabetes 02/20/2003   Overview:  Glucose goal < 100, last value 91 in 2017 HgbA1c goal < 6.0, last value 5.6 in 2017 Urine microalbumin negative in 2017 On diet and exercise   Recurrent UTI 10/19/2014   Squamous cell carcinoma of skin 03/14/2019   Left mid brow. SCCis, hypertrophic. EDC   Past Surgical History:  Procedure Laterality Date   ABDOMINAL HYSTERECTOMY  1975   MASTECTOMY Left    URETEROSCOPY     URETEROSCOPY WITH HOLMIUM LASER LITHOTRIPSY     Family History  Problem Relation Age of Onset   Breast cancer Mother    Skin cancer Father    Social History   Socioeconomic History   Marital status: Widowed    Spouse name: Not on file   Number of children: Not on file   Years of education: Not on file   Highest education level: Not on file  Occupational History   Not on file  Tobacco Use   Smoking status: Never   Smokeless tobacco: Never  Vaping Use   Vaping Use: Never used  Substance and Sexual Activity   Alcohol use: No   Drug use: No   Sexual activity: Not Currently    Birth control/protection: Post-menopausal  Other Topics Concern   Not on file  Social History Narrative   Not on file   Social Determinants of Health   Financial Resource Strain: Low Risk  (03/29/2022)   Overall Financial Resource Strain (CARDIA)    Difficulty of Paying Living Expenses: Not hard at all  Food Insecurity: No Food Insecurity (03/29/2022)   Hunger Vital Sign    Worried About Running Out of Food in the Last Year: Never true    Ran Out of Food  in the Last Year: Never true  Transportation Needs: No Transportation Needs (03/29/2022)   PRAPARE - Hydrologist (Medical): No    Lack of Transportation (Non-Medical): No  Physical Activity: Insufficiently Active (03/29/2022)   Exercise Vital Sign    Days of Exercise per Week: 4 days    Minutes of Exercise per Session: 30 min  Stress: No Stress Concern Present (03/29/2022)   [place]    Feeling of Stress : Not at all  Social Connections: Unknown (03/29/2022)   Social Connection and  Isolation Panel [NHANES]    Frequency of Communication with Friends and Family: More than three times a week    Frequency of Social Gatherings with Friends and Family: More than three times a week    Attends Religious Services: Not on file    Active Member of Clubs or Organizations: Not on file    Attends Archivist Meetings: Not on file    Marital Status: Widowed     Review of Systems  Constitutional:  Positive for fatigue. Negative for appetite change and unexpected weight change.  HENT:  Negative for congestion, sinus pressure and sore throat.   Eyes:  Negative for pain and visual disturbance.  Respiratory:  Negative for cough, chest tightness and shortness of breath.   Cardiovascular:  Negative for chest pain, palpitations and leg swelling.  Gastrointestinal:  Negative for abdominal pain, diarrhea, nausea and vomiting.  Genitourinary:  Negative for difficulty urinating and dysuria.       Nocturia.   Musculoskeletal:  Negative for joint swelling and myalgias.  Skin:  Negative for color change and rash.  Neurological:  Negative for dizziness, light-headedness and headaches.  Hematological:  Negative for adenopathy. Does not bruise/bleed easily.  Psychiatric/Behavioral:  Negative for agitation and dysphoric mood.        Objective:     BP 128/78   Pulse 85   Temp (!) 97.4 F (36.3 C)  (Temporal)   Resp 17   Ht '5\' 2"'  (1.575 m)   Wt 137 lb 3.2 oz (62.2 kg)   SpO2 97%   BMI 25.09 kg/m  Wt Readings from Last 3 Encounters:  03/29/22 137 lb (62.1 kg)  03/24/22 137 lb 3.2 oz (62.2 kg)  03/08/22 137 lb (62.1 kg)    Physical Exam Vitals reviewed.  Constitutional:      General: She is not in acute distress.    Appearance: Normal appearance. She is well-developed.  HENT:     Head: Normocephalic and atraumatic.     Right Ear: External ear normal.     Left Ear: External ear normal.  Eyes:     General: No scleral icterus.       Right eye: No discharge.        Left eye: No discharge.     Conjunctiva/sclera: Conjunctivae normal.  Neck:     Thyroid: No thyromegaly.  Cardiovascular:     Rate and Rhythm: Normal rate and regular rhythm.  Pulmonary:     Effort: No tachypnea, accessory muscle usage or respiratory distress.     Breath sounds: Normal breath sounds. No decreased breath sounds or wheezing.  Chest:  Breasts:    Right: No inverted nipple, mass, nipple discharge or tenderness (no axillary adenopathy).     Left: No inverted nipple, mass, nipple discharge or tenderness (no axilarry adenopathy).  Abdominal:     General: Bowel sounds are normal.     Palpations: Abdomen is soft.     Tenderness: There is no abdominal tenderness.  Musculoskeletal:        General: No swelling or tenderness.     Cervical back: Neck supple.  Lymphadenopathy:     Cervical: No cervical adenopathy.  Skin:    Findings: No erythema or rash.  Neurological:     Mental Status: She is alert and oriented to person, place, and time.  Psychiatric:        Mood and Affect: Mood normal.        Behavior: Behavior normal.  Outpatient Encounter Medications as of 03/24/2022  Medication Sig   amLODipine (NORVASC) 5 MG tablet TAKE 1 TABLET BY MOUTH DAILY   escitalopram (LEXAPRO) 10 MG tablet TAKE 1 TABLET BY MOUTH DAILY (FOR MOOD/DEPRESSION)   famotidine (PEPCID) 20 MG tablet TAKE 1 TABLET  BY MOUTH DAILY   levothyroxine (SYNTHROID, LEVOTHROID) 75 MCG tablet    pravastatin (PRAVACHOL) 40 MG tablet TAKE ONE TABLET EACH EVENING   trospium (SANCTURA) 20 MG tablet Take 1 tablet (20 mg total) by mouth 2 (two) times daily.   vitamin B-12 (CYANOCOBALAMIN) 1000 MCG tablet Take 1 tablet (1,000 mcg total) by mouth daily.   No facility-administered encounter medications on file as of 03/24/2022.     Lab Results  Component Value Date   WBC 6.9 03/22/2022   HGB 15.1 (H) 03/22/2022   HCT 46.0 03/22/2022   PLT 236.0 03/22/2022   GLUCOSE 99 03/22/2022   CHOL 185 03/22/2022   TRIG 122.0 03/22/2022   HDL 79.90 03/22/2022   LDLCALC 80 03/22/2022   ALT 11 03/22/2022   AST 19 03/22/2022   NA 144 03/22/2022   K 4.0 03/22/2022   CL 105 03/22/2022   CREATININE 0.79 03/22/2022   BUN 15 03/22/2022   CO2 28 03/22/2022   TSH 2.32 03/22/2022   HGBA1C 5.7 03/22/2022    MR BRAIN WO CONTRAST  Result Date: 02/16/2022 CLINICAL DATA:  Resting tremor in the left hand. Family history of Lewy body dementia. EXAM: MRI HEAD WITHOUT CONTRAST TECHNIQUE: Multiplanar, multiecho pulse sequences of the brain and surrounding structures were obtained without intravenous contrast. COMPARISON:  Head MRI 11/25/2019 FINDINGS: Brain: There is no evidence of an acute infarct, intracranial hemorrhage, mass, midline shift, or extra-axial fluid collection. Confluent T2 hyperintensities in the cerebral white matter and patchy T2 hyperintensities in the pons are unchanged and nonspecific but compatible with extensive chronic small vessel ischemic disease. Moderate generalized cerebral atrophy is unchanged. Vascular: Major intracranial vascular flow voids are preserved. Skull and upper cervical spine: Unremarkable bone marrow signal. Sinuses/Orbits: Bilateral cataract extraction. Small mucous retention cysts in the left maxillary sinus. Trace right mastoid fluid. Other: None. IMPRESSION: 1. No acute intracranial abnormality. 2.  Unchanged extensive chronic small vessel ischemic disease and moderate cerebral atrophy. Electronically Signed   By: Logan Bores M.D.   On: 02/16/2022 16:59       Assessment & Plan:   Problem List Items Addressed This Visit     Depression, major, single episode, complete remission ([place])    Continue lexapro.  Stable.       Essential (primary) hypertension    On amlodipine.  Blood pressure as outlined.  No changes made in medication.  Follow pressures.  Follow metabolic panel.       Relevant Orders   Basic metabolic panel   Healthcare maintenance    Physical today 03/24/22.  Right breast mammogram 12/01/21 - birads I.       History of breast cancer    S/p mastectomy.  Prescribed arimidex.  Desires not to take. Followed by oncology.        Hyperglycemia    Follow met b and a1c.       Relevant Orders   Hemoglobin A1c   Hyperlipidemia    On pravastatin.  Low cholesterol diet and exercise.  Follow lipid panel and liver function tests.        Relevant Orders   Hepatic function panel   Lipid panel   Hypothyroidism    On thyroid replacement.  Follow tsh.       OSA (obstructive sleep apnea)    With the issues with sleeping and nocturia, discussed f/u sleep study.  Will notify me if agreeable.       Thoracic aortic ectasia (HCC)    Continue blood pressure control - amlodipine.  Continue pravastatin.       Tremor    Undergoing evaluation by neurology.       Other Visit Diagnoses     Routine general medical examination at a health care facility    -  Primary        Einar Pheasant, MD

## 2022-03-29 ENCOUNTER — Ambulatory Visit (INDEPENDENT_AMBULATORY_CARE_PROVIDER_SITE_OTHER): Payer: Medicare HMO

## 2022-03-29 VITALS — Ht 62.0 in | Wt 137.0 lb

## 2022-03-29 DIAGNOSIS — Z0000 Encounter for general adult medical examination without abnormal findings: Secondary | ICD-10-CM | POA: Diagnosis not present

## 2022-03-29 NOTE — Progress Notes (Signed)
Subjective:   Jennifer Bernard is a 88 y.o. female who presents for Medicare Annual (Subsequent) preventive examination.  Review of Systems    No ROS.  Medicare Wellness Virtual Visit.  Visual/audio telehealth visit, UTA vital signs.   See social history for additional risk factors.   Cardiac Risk Factors include: advanced age (>70mn, >>75women);hypertension     Objective:    Today's Vitals   03/29/22 1505  Weight: 137 lb (62.1 kg)  Height: '5\' 2"'$  (1.575 m)   Body mass index is 25.06 kg/m.     03/29/2022    3:27 PM 03/26/2021    2:16 PM 03/25/2020    2:30 PM 10/22/2019    4:29 PM  Advanced Directives  Does Patient Have a Medical Advance Directive? Yes Yes Yes Yes  Type of AParamed[place]of [place] will [place]Living will [place]ady [place]Living will Living will;Healthcare Power of AOlivetOut of facility DNR (pink MOST or yellow form)  Does patient want to make changes to medical advance directive? No - Patient declined No - Patient declined No - Patient declined No - Patient declined  Copy of HPigeonin Chart? No - copy requested No - copy requested No - copy requested     Current Medications (verified) Outpatient Encounter Medications as of 03/29/2022  Medication Sig   amLODipine (NORVASC) 5 MG tablet TAKE 1 TABLET BY MOUTH DAILY   escitalopram (LEXAPRO) 10 MG tablet TAKE 1 TABLET BY MOUTH DAILY (FOR MOOD/DEPRESSION)   famotidine (PEPCID) 20 MG tablet TAKE 1 TABLET BY MOUTH DAILY   levothyroxine (SYNTHROID, LEVOTHROID) 75 MCG tablet    pravastatin (PRAVACHOL) 40 MG tablet TAKE ONE TABLET EACH EVENING   trospium (SANCTURA) 20 MG tablet Take 1 tablet (20 mg total) by mouth 2 (two) times daily.   vitamin B-12 (CYANOCOBALAMIN) 1000 MCG tablet Take 1 tablet (1,000 mcg total) by mouth daily.   No facility-administered encounter medications on file as of 03/29/2022.    Allergies  (verified) Penicillins, Ceftin [cefuroxime axetil], and Ramipril   History: Past Medical History:  Diagnosis Date   Asymptomatic menopausal state 02/20/1984   Overview:  Naturally occuring and asymptomatic Vaginal atrophy present on topical estrogen Overview:  Overview:  Naturally occuring and asymptomatic Vaginal atrophy present on topical estrogen   Basal cell carcinoma of skin 02/19/2013   Overview:  Basal cell on her nasolabial fold 2014 S/p Mohs by Dr. CLacinda AxonFollowed by local dermatologist regularly Overview:  Overview:  Followed by Dr. CLacinda Axon  Cyst of ovary 02/19/2009   Overview:  Ovarian cyst on right stable 2004-2010 Cyst ? larger on CT 2012 Stable on follow [place] SEdwinna AreolaOverview:  Overview:  Ovarian cyst on right stable 2004-2010 Cyst ? larger on CT 2012 Stable on follow [place]. SEdwinna Areola  Depression, major, single episode, complete remission ([place] 07/18/2017   Diffuse cystic mastopathy of breast 02/19/2013   Overview:  Mother with breast cancer later in life Two breast biopsies both with normal pathology Annual mammograms recommended, [facility]  Relevant Hx: Course: Daily Update: Today's Plan: Overview:  Overview:  Mother with breast cancer later in life Two breast biopsies both with normal pathology Annual mammograms recommended, last 201   Essential (primary) hypertension 02/19/2013   Overview:  Goal blood pressure < 135/85 On atenolol Overview:  Overview:  Goal blood  pressure < 135/85 On atenolol   Hyperlipidemia 02/19/2013   Overview:  Goal LDL < 130, last value 92 in 2017 On Pravachol Overview:  Overview:  Goal LDL < 130, last value 82 in 2012 On Pravachol   Hypothyroidism 02/28/2012   Overview:  started on T4  TFT normal 2017 Followed by Dr. Almon Hercules Overview:  Overview:  started on T4  TFT normal 2014 Followed by Dr. Almon Hercules   Impaired glucose tolerance 02/20/2003   Overview:  Overview:   Glucose goal < 100, last value 94 in 2014 HgbA1c goal < 6.0, last value 5.3 in 2014 Urine microalbumin negative in 2014 On diet and exercise   Nontoxic multinodular goiter 03/06/2003   Overview:  Asymptomatic nodular thyroid Stable [place] 2004-2014 Followed by Dr. Almon Hercules Overview:  Overview:  Asymptomatic nodular thyroid Stable [place] 2004-2014 Followed by Dr. Almon Hercules   OSA on CPAP 01/13/2015   Overview:  Sleep study sent to medical records:   Sleep efficiency 22%, latency 24 min, # awakenings 11, res desat N=10, index 17.8  Apnea 1 for 16 sec, hypopnea 16 for 18 sec, total sleep time only 90 min so index = 11 She did not sleep much and the index puts her in mild category Repeat study a few days later when she slept 187 min her AHI = 4.6 which is normal, average O2 sat 94% I don't think    Osteoarthritis 02/20/2003   Overview:  Mild bilateral hands Right CMC Right hip Left knee s/p meniscus tear All symptomatically stable Overview:  Overview:  Mild bilateral hands Right CMC Right hip Left knee s/p meniscus tear All symptomatically stable   Osteoporosis 02/20/2003   Overview:  Bone density 2004 LS -3.32 and RH-1.62 Bone density 2008 LS -3.14 and RH -1.22 Bone density 2009 LS -3.17 and RH -1.21 Bone density 2010 LS -3.14 and RH -1.42  Bone density 2014 LS -2.7 (L3 -3.1) and RH -1.5 Bone density 2017 LS -2.3 and RH -1.4 (neck -1.7) Vitamin D level 38 in 2012 On Boniva 2004 to 2010, restarted 2015 Repeat bone density in 2020 and stop Boniva  Last Assessment & Pl   Prediabetes 02/20/2003   Overview:  Glucose goal < 100, last value 91 in 2017 HgbA1c goal < 6.0, last value 5.6 in 2017 Urine microalbumin negative in 2017 On diet and exercise   Recurrent UTI 10/19/2014   Squamous cell carcinoma of skin 03/14/2019   Left mid brow. SCCis, hypertrophic. EDC   Past Surgical History:  Procedure Laterality Date   ABDOMINAL HYSTERECTOMY  1975   MASTECTOMY Left    URETEROSCOPY     URETEROSCOPY WITH HOLMIUM LASER LITHOTRIPSY      Family History  Problem Relation Age of Onset   Breast cancer Mother    Skin cancer Father    Social History   Socioeconomic History   Marital status: Widowed    Spouse name: Not on file   Number of children: Not on file   Years of education: Not on file   Highest education level: Not on file  Occupational History   Not on file  Tobacco Use   Smoking status: Never   Smokeless tobacco: Never  Vaping Use   Vaping Use: Never used  Substance and Sexual Activity   Alcohol use: No   Drug use: No   Sexual activity: Not Currently    Birth control/protection: Post-menopausal  Other Topics Concern   Not on file  Social History Narrative   Not on file  Social Determinants of Health   Financial Resource Strain: Low Risk  (03/29/2022)   Overall Financial Resource Strain (CARDIA)    Difficulty of Paying Living Expenses: Not hard at all  Food Insecurity: No Food Insecurity (03/29/2022)   Hunger Vital Sign    Worried About Running Out of Food in the Last Year: Never true    Ran Out of Food in the Last Year: Never true  Transportation Needs: No Transportation Needs (03/29/2022)   PRAPARE - Hydrologist (Medical): No    Lack of Transportation (Non-Medical): No  Physical Activity: Insufficiently Active (03/29/2022)   Exercise Vital Sign    Days of Exercise per Week: 4 days    Minutes of Exercise per Session: 30 min  Stress: No Stress Concern Present (03/29/2022)   [place]    Feeling of Stress : Not at all  Social Connections: Unknown (03/29/2022)   Social Connection and Isolation Panel [NHANES]    Frequency of Communication with Friends and Family: More than three times a week    Frequency of Social Gatherings with Friends and Family: More than three times a week    Attends Religious Services: Not on file    Active Member of Clubs or Organizations: Not on file    Attends Theatre manager Meetings: Not on file    Marital Status: Widowed    Tobacco Counseling Counseling given: Not Answered   Clinical Intake:  Pre-visit preparation completed: Yes        Diabetes: No  How often do you need to have someone help you when you read instructions, pamphlets, or other written materials from your doctor or pharmacy?: 1 - Never    Interpreter Needed?: No    Activities of Daily Living    03/29/2022    3:11 PM  In your present state of health, do you have any difficulty performing the following activities:  Hearing? 1  Comment Hearing aids  Vision? 0  Difficulty concentrating or making decisions? 0  Walking or climbing stairs? 0  Dressing or bathing? 0  Doing errands, shopping? 1  Comment [place] and eating ? N  Using the Toilet? N  In the past six months, have you accidently leaked urine? Y  Comment Managed with medication and brief as needed  Do you have problems with loss of bowel control? N  Managing your Medications? N  Managing your Finances? N  Housekeeping or managing your Housekeeping? N    Patient Care Team: Einar Pheasant, MD as PCP - General (Internal Medicine)  Indicate any recent Medical Services you may have received from other than [facility] providers in the past year (date may be approximate).     Assessment:   This is a routine wellness examination for Jennifer Bernard.  Virtual Visit via Telephone Note  I connected with  Jennifer Bernard on 03/29/22 at  3:00 PM EDT by telephone and verified that I am speaking with the correct person using two identifiers.  Location: Patient: home Provider: office Persons participating in the virtual visit: patient/Nurse Health Advisor   I discussed the limitations of performing an evaluation and management service by telehealth. We continued and completed visit with audio only. Some vital signs may be absent or patient reported.   Hearing/Vision screen Hearing Screening  - Comments:: Hearing aids Vision Screening - Comments:: Followed by [facility]  Wears corrective lenses  Cataract extraction, bilateral They  have seen their ophthalmologist in the last 12 months.   Dietary issues and exercise activities discussed: Current Exercise Habits: Home exercise routine, Type of exercise: calisthenics, Time (Minutes): 30, Frequency (Times/Week): 4, Weekly Exercise (Minutes/Week): 120, Intensity: Mild Regular diet Boost daily. Plans to try Premier brand due to this brand causing diarrhea. Nurse encourages patient to stay hydrated.     Goals Addressed             This Visit's Progress    Maintain Healthy Lifestyle       Stay active. Stay hydrated. Healthy diet.       Depression Screen    03/29/2022    3:34 PM 03/24/2022    9:10 AM 07/22/2021    3:03 PM 04/06/2021   11:34 AM 03/26/2021    2:18 PM 10/15/2020    2:56 PM 07/16/2020    4:52 PM  PHQ 2/9 Scores  PHQ - 2 Score 2 4 0 0 0 2 3  PHQ- 9 Score '7 9  3  7 9    '$ Fall Risk    03/29/2022    3:11 PM 03/24/2022    9:08 AM 07/22/2021    3:03 PM 03/26/2021    2:18 PM 03/25/2020    2:13 PM  Fall Risk   Falls in the past year? 0 0 0 0 0  Number falls in past yr: 0 0 0  0  Injury with Fall?  0 0    Risk for fall due to :  No Fall Risks No Fall Risks    Follow up Falls evaluation completed Falls evaluation completed Falls evaluation completed Falls evaluation completed Falls evaluation completed    [place]: Home free of loose throw rugs in walkways, pet beds, electrical cords, etc? Yes  Adequate lighting in your home to reduce risk of falls? Yes   ASSISTIVE DEVICES UTILIZED TO PREVENT FALLS: Life alert? Yes  Use of a cane, walker or w/c? No Grab bars in the bathroom? Yes   TIMED UP AND GO: Was the test performed? No .   Cognitive Function:  Patient is alert and oriented x3.       03/29/2022    3:33 PM 03/26/2021    2:27 PM 03/25/2020    2:24 PM  6CIT  Screen  What Year? 0 points 0 points 0 points  What month? 0 points 0 points 0 points  What time? 0 points 0 points 0 points  Count back from 20 0 points    Months in reverse 0 points  0 points  Repeat phrase   0 points    Immunizations Immunization History  Administered Date(s) Administered   Fluad Quad(high Dose 65+) 05/20/2019   Influenza, High Dose Seasonal PF 05/27/2021   Influenza-Unspecified 05/28/2015, 05/15/2016, 05/09/2017, 06/30/2020   PFIZER(Purple Top)SARS-COV-2 Vaccination 08/21/2019, 09/11/2019, 06/08/2020   Pneumococcal Conjugate-13 04/16/2014   Pneumococcal Polysaccharide-23 08/15/2009   Tdap 08/15/2010, 05/19/2021   Zoster Recombinat (Shingrix) 02/28/2019, 07/16/2019   Zoster, Live 02/27/2010    Screening Tests Health Maintenance  Topic Date Due   COVID-19 Vaccine (4 - Pfizer risk series) 04/09/2022 (Originally 08/03/2020)   INFLUENZA VACCINE  04/15/2022 (Originally 03/15/2022)   TETANUS/TDAP  05/20/2031   Pneumonia Vaccine 24+ Years old  Completed   DEXA SCAN  Completed   Zoster Vaccines- Shingrix  Completed   HPV VACCINES  Aged Out   Health Maintenance There are no preventive care reminders to display for this patient.  Lung Cancer  Screening: (Low Dose CT Chest recommended if Age 77-80 years, 30 pack-year currently smoking OR have quit w/in 15years.) does not qualify.   Hepatitis C Screening: does not qualify.  Vision Screening: Recommended annual ophthalmology exams for early detection of glaucoma and other disorders of the eye.  Dental Screening: Recommended annual dental exams for proper oral hygiene  Community Resource Referral / Chronic Care Management: CRR required this visit?  No   CCM required this visit?  No      Plan:   Keep all routine maintenance appointments.   I have personally reviewed and noted the following in the patient's chart:   Medical and social history Use of alcohol, tobacco or illicit drugs  Current medications and  supplements including opioid prescriptions.  Functional ability and status Nutritional status Physical activity Advanced directives List of other physicians Hospitalizations, surgeries, and ER visits in previous 12 months Vitals Screenings to include cognitive, depression, and falls Referrals and appointments  In addition, I have reviewed and discussed with patient certain preventive protocols, quality metrics, and best practice recommendations. A written personalized care plan for preventive services as well as general preventive health recommendations were provided to patient.     Varney Biles, LPN   [id number]

## 2022-03-29 NOTE — Patient Instructions (Addendum)
  Jennifer Bernard , Thank you for taking time to come for your Medicare Wellness Visit. I appreciate your ongoing commitment to your health goals. Please review the following plan we discussed and let me know if I can assist you in the future.   These are the goals we discussed:  Goals      Maintain Healthy Lifestyle     Stay active. Stay hydrated. Healthy diet.        This is a list of the screening recommended for you and due dates:  Health Maintenance  Topic Date Due   COVID-19 Vaccine (4 - Pfizer risk series) 04/09/2022*   Flu Shot  04/15/2022*   Tetanus Vaccine  05/20/2031   Pneumonia Vaccine  Completed   DEXA scan (bone density measurement)  Completed   Zoster (Shingles) Vaccine  Completed   HPV Vaccine  Aged Out  *Topic was postponed. The date shown is not the original due date.

## 2022-03-30 ENCOUNTER — Ambulatory Visit: Payer: Medicare HMO | Admitting: Urology

## 2022-03-31 ENCOUNTER — Ambulatory Visit: Payer: Medicare HMO | Admitting: Urology

## 2022-04-03 ENCOUNTER — Encounter: Payer: Self-pay | Admitting: Internal Medicine

## 2022-04-03 DIAGNOSIS — Z0000 Encounter for general adult medical examination without abnormal findings: Secondary | ICD-10-CM | POA: Insufficient documentation

## 2022-04-03 NOTE — Assessment & Plan Note (Signed)
Physical today 03/24/22.  Right breast mammogram 12/01/21 - birads I.

## 2022-04-03 NOTE — Assessment & Plan Note (Signed)
Follow met b and a1c.  

## 2022-04-03 NOTE — Assessment & Plan Note (Signed)
Undergoing evaluation by neurology.

## 2022-04-03 NOTE — Assessment & Plan Note (Signed)
On pravastatin.  Low cholesterol diet and exercise.  Follow lipid panel and liver function tests.   

## 2022-04-03 NOTE — Assessment & Plan Note (Signed)
Continue blood pressure control - amlodipine.  Continue pravastatin.  

## 2022-04-03 NOTE — Assessment & Plan Note (Signed)
With the issues with sleeping and nocturia, discussed f/u sleep study.  Will notify me if agreeable.

## 2022-04-03 NOTE — Assessment & Plan Note (Signed)
Continue lexapro.  Stable.  

## 2022-04-03 NOTE — Assessment & Plan Note (Signed)
On amlodipine.  Blood pressure as outlined.  No changes made in medication.  Follow pressures.  Follow metabolic panel.  

## 2022-04-03 NOTE — Assessment & Plan Note (Signed)
S/p mastectomy.  Prescribed arimidex.  Desires not to take. Followed by oncology.

## 2022-04-03 NOTE — Assessment & Plan Note (Signed)
On thyroid replacement.  Follow tsh.  

## 2022-04-05 ENCOUNTER — Telehealth: Payer: Self-pay | Admitting: Urology

## 2022-04-05 NOTE — Telephone Encounter (Signed)
Pt called to let you know that Trospium does not seem to be working for her. She is still getting up several times at night. She has an upcoming appt on 9/5; should she still continue to take this medication?

## 2022-04-06 ENCOUNTER — Ambulatory Visit (INDEPENDENT_AMBULATORY_CARE_PROVIDER_SITE_OTHER): Payer: Medicare HMO | Admitting: Dermatology

## 2022-04-06 ENCOUNTER — Ambulatory Visit: Payer: Medicare HMO | Admitting: Dermatology

## 2022-04-06 DIAGNOSIS — Z85828 Personal history of other malignant neoplasm of skin: Secondary | ICD-10-CM

## 2022-04-06 DIAGNOSIS — S20161A Insect bite (nonvenomous) of breast, right breast, initial encounter: Secondary | ICD-10-CM | POA: Diagnosis not present

## 2022-04-06 DIAGNOSIS — Z1283 Encounter for screening for malignant neoplasm of skin: Secondary | ICD-10-CM

## 2022-04-06 DIAGNOSIS — L821 Other seborrheic keratosis: Secondary | ICD-10-CM | POA: Diagnosis not present

## 2022-04-06 DIAGNOSIS — L814 Other melanin hyperpigmentation: Secondary | ICD-10-CM | POA: Diagnosis not present

## 2022-04-06 DIAGNOSIS — L820 Inflamed seborrheic keratosis: Secondary | ICD-10-CM

## 2022-04-06 DIAGNOSIS — W57XXXA Bitten or stung by nonvenomous insect and other nonvenomous arthropods, initial encounter: Secondary | ICD-10-CM

## 2022-04-06 DIAGNOSIS — L578 Other skin changes due to chronic exposure to nonionizing radiation: Secondary | ICD-10-CM

## 2022-04-06 DIAGNOSIS — L988 Other specified disorders of the skin and subcutaneous tissue: Secondary | ICD-10-CM

## 2022-04-06 DIAGNOSIS — D229 Melanocytic nevi, unspecified: Secondary | ICD-10-CM

## 2022-04-06 DIAGNOSIS — D1800 Hemangioma unspecified site: Secondary | ICD-10-CM

## 2022-04-06 MED ORDER — MUPIROCIN 2 % EX OINT: TOPICAL_OINTMENT | CUTANEOUS | 0 refills | Status: DC

## 2022-04-06 NOTE — Progress Notes (Signed)
Follow-Up Visit   Subjective  Jennifer Bernard is a 88 y.o. female who presents for the following: Skin Problem. The patient presents for Upper Body Skin Exam (UBSE) for skin cancer screening and mole check.  The patient has spots, moles and lesions to be evaluated, some may be new or changing and the patient has concerns that these could be cancer.   The following portions of the chart were reviewed this encounter and updated as appropriate:   Tobacco  Allergies  Meds  Problems  Med Hx  Surg Hx  Fam Hx     Review of Systems:  No other skin or systemic complaints except as noted in HPI or Assessment and Plan.  Objective  Well appearing patient in no apparent distress; mood and affect are within normal limits.  A focused examination was performed including face,chest,back. Relevant physical exam findings are noted in the Assessment and Plan.  right under side breast ~1.0 cm patch of small crust      left posterior shoulder x 1, left back x 1, left side x 1, right shoulder x 1  (4) (4) Stuck-on, waxy, tan-brown papules --Discussed benign etiology and prognosis.   face Rhytides and volume loss.               Assessment & Plan  Bug bite without infection, initial encounter right under side breast Bite vs resolving shingles  Start Mupirocin ointment apply to affected skin twice a day   Inflamed seborrheic keratosis (4) left posterior shoulder x 1, left back x 1, left side x 1, right shoulder x 1  (4) Symptomatic, irritating, patient would like treated.  Destruction of lesion - left posterior shoulder x 1, left back x 1, left side x 1, right shoulder x 1  (4) Complexity: simple   Destruction method: cryotherapy   Informed consent: discussed and consent obtained   Timeout:  patient name, date of birth, surgical site, and procedure verified Lesion destroyed using liquid nitrogen: Yes   Region frozen until ice ball extended beyond lesion: Yes   Outcome:  patient tolerated procedure well with no complications   Post-procedure details: wound care instructions given    Elastosis of skin face Botox 40 units injected Frown complex 20 units Crows feet 10 units each side   Intralesional injection - face Location: See attached image  Informed consent: Discussed risks (infection, pain, bleeding, bruising, swelling, allergic reaction, paralysis of nearby muscles, eyelid droop, double vision, neck weakness, difficulty breathing, headache, undesirable cosmetic result, and need for additional treatment) and benefits of the procedure, as well as the alternatives.  Informed consent was obtained.  Preparation: The area was cleansed with alcohol.  Procedure Details:  Botox was injected into the dermis with a 30-gauge needle. Pressure applied to any bleeding. Ice packs offered for swelling.  Lot Number:  [id number] Expiration:  06/15/2024  Total Units Injected:  40  Plan: Patient was instructed to remain upright for 4 hours. Patient was instructed to avoid massaging the face and avoid vigorous exercise for the rest of the day. Tylenol may be used for headache.  Allow 2 weeks before returning to clinic for additional dosing as needed. Patient will call for any problems.  Lentigines - Scattered tan macules - Due to sun exposure - Benign-appearing, observe - Recommend daily broad spectrum sunscreen SPF 30+ to sun-exposed areas, reapply every 2 hours as needed. - Call for any changes  Seborrheic Keratoses - Stuck-on, waxy, tan-brown papules and/or plaques  -  Benign-appearing - Discussed benign etiology and prognosis. - Observe - Call for any changes  Melanocytic Nevi - Tan-brown and/or pink-flesh-colored symmetric macules and papules - Benign appearing on exam today - Observation - Call clinic for new or changing moles - Recommend daily use of broad spectrum spf 30+ sunscreen to sun-exposed areas.   Hemangiomas - Red papules - Discussed benign  nature - Observe - Call for any changes  Actinic Damage - Chronic condition, secondary to cumulative UV/sun exposure - diffuse scaly erythematous macules with underlying dyspigmentation - Recommend daily broad spectrum sunscreen SPF 30+ to sun-exposed areas, reapply every 2 hours as needed.  - Staying in the shade or wearing long sleeves, sun glasses (UVA+UVB protection) and wide brim hats (4-inch brim around the entire circumference of the hat) are also recommended for sun protection.  - Call for new or changing lesions.  History of Basal Cell Carcinoma of the Skin Nasolabial fold 2014 - No evidence of recurrence today - Recommend regular full body skin exams - Recommend daily broad spectrum sunscreen SPF 30+ to sun-exposed areas, reapply every 2 hours as needed.  - Call if any new or changing lesions are noted between office visits   History of Squamous Cell Carcinoma of the Skin Left mid brow 2020 - No evidence of recurrence today - No lymphadenopathy - Recommend regular full body skin exams - Recommend daily broad spectrum sunscreen SPF 30+ to sun-exposed areas, reapply every 2 hours as needed.  - Call if any new or changing lesions are noted between office visits  Skin cancer screening performed today.   Return in about 1 year (around 04/07/2023) for TBSE hx of skin cancer, Botox in 3-4 months .  IMarye Round, CMA, am acting as scribe for Sarina Ser, MD .  Documentation: I have reviewed the above documentation for accuracy and completeness, and I agree with the above.  Sarina Ser, MD

## 2022-04-06 NOTE — Patient Instructions (Signed)
Due to recent changes in healthcare laws, you may see results of your pathology and/or laboratory studies on MyChart before the doctors have had a chance to review them. We understand that in some cases there may be results that are confusing or concerning to you. Please understand that not all results are received at the same time and often the doctors may need to interpret multiple results in order to provide you with the best plan of care or course of treatment. Therefore, we ask that you please give us 2 business days to thoroughly review all your results before contacting the office for clarification. Should we see a critical lab result, you will be contacted sooner.   If You Need Anything After Your Visit  If you have any questions or concerns for your doctor, please call our main line at [phone] and press option 4 to reach your doctor's medical assistant. If no one answers, please leave a voicemail as directed and we will return your call as soon as possible. Messages left after 4 pm will be answered the following business day.   You may also send us a message via MyChart. We typically respond to MyChart messages within 1-2 business days.  For prescription refills, please ask your pharmacy to contact our office. Our fax number is [phone].  If you have an urgent issue when the clinic is closed that cannot wait until the next business day, you can page your doctor at the number below.    Please note that while we do our best to be available for urgent issues outside of office hours, we are not available 24/7.   If you have an urgent issue and are unable to reach us, you may choose to seek medical care at your doctor's office, retail clinic, urgent care center, or emergency room.  If you have a medical emergency, please immediately call 911 or go to the emergency department.  Pager Numbers  - Dr. Kowalski: [phone]  - Dr. Moye: [phone]  - Dr. Stewart:  [phone]  In the event of inclement weather, please call our main line at [phone] for an update on the status of any delays or closures.  Dermatology Medication Tips: Please keep the boxes that topical medications come in in order to help keep track of the instructions about where and how to use these. Pharmacies typically print the medication instructions only on the boxes and not directly on the medication tubes.   If your medication is too expensive, please contact our office at [phone] option 4 or send us a message through MyChart.   We are unable to tell what your co-pay for medications will be in advance as this is different depending on your insurance coverage. However, we may be able to find a substitute medication at lower cost or fill out paperwork to get insurance to cover a needed medication.   If a prior authorization is required to get your medication covered by your insurance company, please allow us 1-2 business days to complete this process.  Drug prices often vary depending on where the prescription is filled and some pharmacies may offer cheaper prices.  The website www.goodrx.com contains coupons for medications through different pharmacies. The prices here do not account for what the cost may be with help from insurance (it may be cheaper with your insurance), but the website can give you the price if you did not use any insurance.  - You can print the associated coupon and take it with   your prescription to the pharmacy.  - You may also stop by our office during regular business hours and pick up a GoodRx coupon card.  - If you need your prescription sent electronically to a different pharmacy, notify our office through [facility] MyChart or by phone at [phone] option 4.     Si Usted Necesita Algo Despus de Su Visita  Tambin puede enviarnos un mensaje a travs de MyChart. Por lo general respondemos a los mensajes de MyChart en el transcurso de 1 a 2  das hbiles.  Para renovar recetas, por favor pida a su farmacia que se ponga en contacto con nuestra oficina. Nuestro nmero de fax es el [phone].  Si tiene un asunto urgente cuando la clnica est cerrada y que no puede esperar hasta el siguiente da hbil, puede llamar/localizar a su doctor(a) al nmero que aparece a continuacin.   Por favor, tenga en cuenta que aunque hacemos todo lo posible para estar disponibles para asuntos urgentes fuera del horario de oficina, no estamos disponibles las 24 horas del da, los 7 das de la semana.   Si tiene un problema urgente y no puede comunicarse con nosotros, puede optar por buscar atencin mdica  en el consultorio de su doctor(a), en una clnica privada, en un centro de atencin urgente o en una sala de emergencias.  Si tiene una emergencia mdica, por favor llame inmediatamente al 911 o vaya a la sala de emergencias.  Nmeros de bper  - Dr. Kowalski: [phone]  - Dra. Moye: [phone]  - Dra. Stewart: [phone]  En caso de inclemencias del tiempo, por favor llame a nuestra lnea principal al [phone] para una actualizacin sobre el estado de cualquier retraso o cierre.  Consejos para la medicacin en dermatologa: Por favor, guarde las cajas en las que vienen los medicamentos de uso tpico para ayudarle a seguir las instrucciones sobre dnde y cmo usarlos. Las farmacias generalmente imprimen las instrucciones del medicamento slo en las cajas y no directamente en los tubos del medicamento.   Si su medicamento es muy caro, por favor, pngase en contacto con nuestra oficina llamando al [phone] y presione la opcin 4 o envenos un mensaje a travs de MyChart.   No podemos decirle cul ser su copago por los medicamentos por adelantado ya que esto es diferente dependiendo de la cobertura de su seguro. Sin embargo, es posible que podamos encontrar un medicamento sustituto a menor costo o llenar un formulario para que el  seguro cubra el medicamento que se considera necesario.   Si se requiere una autorizacin previa para que su compaa de seguros cubra su medicamento, por favor permtanos de 1 a 2 das hbiles para completar este proceso.  Los precios de los medicamentos varan con frecuencia dependiendo del lugar de dnde se surte la receta y alguna farmacias pueden ofrecer precios ms baratos.  El sitio web www.goodrx.com tiene cupones para medicamentos de diferentes farmacias. Los precios aqu no tienen en cuenta lo que podra costar con la ayuda del seguro (puede ser ms barato con su seguro), pero el sitio web puede darle el precio si no utiliz ningn seguro.  - Puede imprimir el cupn correspondiente y llevarlo con su receta a la farmacia.  - Tambin puede pasar por nuestra oficina durante el horario de atencin regular y recoger una tarjeta de cupones de GoodRx.  - Si necesita que su receta se enve electrnicamente a una farmacia diferente, informe a nuestra oficina a travs de MyChart de [facility]   o por telfono llamando al [phone] y presione la opcin 4.  

## 2022-04-06 NOTE — Telephone Encounter (Signed)
-----   Message from Dia Crawford, LPN sent at 7/82/4235 12:44 PM EDT ----- Regarding: RE: Patient Question Patient made aware and agrees to clarify medication question regarding Trospium with Urology. No further follow up needed at this time.   ----- Message ----- From: Einar Pheasant, MD Sent: 04/02/2022   5:10 AM EDT To: Dia Crawford, LPN Subject: RE: Patient Question                           This was prescribed by urology.  Please have her call urology to clarify.  In note, mentions q hs.  Rx states bid.  Please have her clarify with urology.    Thanks.  Dr Nicki Reaper ----- Message ----- From: Dia Crawford, LPN Sent: 3/61/4431   5:05 PM EDT To: Einar Pheasant, MD Subject: Patient Question                               Hi Dr. Nicki Reaper,   Patient asks if okay for her to drink wine? Patient also asks if okay to take Trospium BID, as written. She recalls taking only one daily in the past (2021 and 2022). Taking only one now. She does wake about two times at night for urination.  Patient also asks is she starts taking it BID can it be taken with the synthroid in the morning?  Thanks,  Jennifer Bernard

## 2022-04-06 NOTE — Telephone Encounter (Signed)
Notified pt of instructions, confirmed upcoming appt. Pt expressed understanding.

## 2022-04-08 ENCOUNTER — Encounter: Payer: Self-pay | Admitting: Dermatology

## 2022-04-16 ENCOUNTER — Other Ambulatory Visit: Payer: Self-pay | Admitting: Urology

## 2022-04-16 DIAGNOSIS — R351 Nocturia: Secondary | ICD-10-CM

## 2022-04-19 ENCOUNTER — Ambulatory Visit: Payer: Medicare HMO | Admitting: Urology

## 2022-04-20 NOTE — Progress Notes (Signed)
04/21/2022 11:25 AM   Jennifer Bernard 10-12-1926 [id number]  Referring provider: Einar Pheasant, [address] [place],  [place] [address]  Urological history: 1. Nocturia -contributing risk factors of obstructive sleep apnea, hypertension, diabetes, arthritis, asthma, anxiety and depression -PVR 0 mL  -failed trospium IR 20 mg qhs  2.  Nephrolithiasis -Left URS, 2016  3. rUTI's -Contributing factors of age, vaginal atrophy, diabetes -documented positive urine cultures over the last year  05/05/2021 - e.coli   Chief Complaint  Patient presents with   Over Active Bladder    HPI: Jennifer Bernard is a 88 y.o. female who presents today for nocturia and frequency with her daughter, Eustaquio Maize.    At her visit on 03/08/2022, her OAB questionnaire indicated that she urinates 1-7 times during the day, 1-2 times during the night with a strong urge to urinate.  She has urinary leakage 1-2 times weekly.  She wears absorbent pads and absorbent underwear daily.  She states she does not limit fluid intake, but she told Crysta that she did not have anything to drink today.  She does engage in toilet mapping on certain occasions.  She states she drinks about a 16 ounce bottle of water daily and some tea and a glass of wine at night.  She was explained how the nighttime glasses of wine can contribute to the nocturia as well as a sleep apnea.  We did have trial of taking the Sanctura 20 mg at night to see if this would diminish the number of nocturia episodes.  She called back in the interim stating the trospium was not effective.  On the OAB questionnaire, she is indicated she has 1-7 daytime urinations, 1-2 nighttime urinations with a strong urge to urinate.  She leaks 1-2 times weekly wearing absorbent pads daily.  She does toilet map on occasion.  She is not limiting fluids to avoid excessive urination or incontinence.  Patient denies any modifying or aggravating  factors.  Patient denies any gross hematuria, dysuria or suprapubic/flank pain.  Patient denies any fevers, chills, nausea or vomiting.     She has reduced her nocturia to 1 time nightly a few nights a week from her nocturia x2 while taking the trospium IR 20 mg twice daily.  PMH: Past Medical History:  Diagnosis Date   Asymptomatic menopausal state 02/20/1984   Overview:  Naturally occuring and asymptomatic Vaginal atrophy present on topical estrogen Overview:  Overview:  Naturally occuring and asymptomatic Vaginal atrophy present on topical estrogen   Basal cell carcinoma of skin 02/19/2013   Overview:  Basal cell on her nasolabial fold 2014 S/p Mohs by Dr. Lacinda Axon Followed by local dermatologist regularly Overview:  Overview:  Followed by Dr. Lacinda Axon   Cyst of ovary 02/19/2009   Overview:  Ovarian cyst on right stable 2004-2010 Cyst ? larger on CT 2012 Stable on follow up US Followed by Dr. Edwinna Areola Overview:  Overview:  Ovarian cyst on right stable 2004-2010 Cyst ? larger on CT 2012 Stable on follow up US Followed by Dr. Edwinna Areola   Depression, major, single episode, complete remission ([place]) 07/18/2017   Diffuse cystic mastopathy of breast 02/19/2013   Overview:  Mother with breast cancer later in life Two breast biopsies both with normal pathology Annual mammograms recommended, last 2016 Followed at the [facility]:  Relevant Hx: Course: Daily Update: Today's Plan: Overview:  Overview:  Mother with breast cancer later in life  Two breast biopsies both with normal pathology Annual mammograms recommended, last 201   Essential (primary) hypertension 02/19/2013   Overview:  Goal blood pressure < 135/85 On atenolol Overview:  Overview:  Goal blood pressure < 135/85 On atenolol   Hyperlipidemia 02/19/2013   Overview:  Goal LDL < 130, last value 92 in 2017 On Pravachol Overview:  Overview:  Goal LDL < 130, last value 82 in 2012 On Pravachol   Hypothyroidism 02/28/2012    Overview:  started on T4  TFT normal 2017 Followed by Dr. Almon Hercules Overview:  Overview:  started on T4  TFT normal 2014 Followed by Dr. Almon Hercules   Impaired glucose tolerance 02/20/2003   Overview:  Overview:  Glucose goal < 100, last value 94 in 2014 HgbA1c goal < 6.0, last value 5.3 in 2014 Urine microalbumin negative in 2014 On diet and exercise   Nontoxic multinodular goiter 03/06/2003   Overview:  Asymptomatic nodular thyroid Stable [place] 2004-2014 Followed by Dr. Almon Hercules Overview:  Overview:  Asymptomatic nodular thyroid Stable [place] 2004-2014 Followed by Dr. Almon Hercules   OSA on CPAP 01/13/2015   Overview:  Sleep study sent to medical records:   Sleep efficiency 22%, latency 24 min, # awakenings 11, res desat N=10, index 17.8  Apnea 1 for 16 sec, hypopnea 16 for 18 sec, total sleep time only 90 min so index = 11 She did not sleep much and the index puts her in mild category Repeat study a few days later when she slept 187 min her AHI = 4.6 which is normal, average O2 sat 94% I don't think    Osteoarthritis 02/20/2003   Overview:  Mild bilateral hands Right CMC Right hip Left knee s/p meniscus tear All symptomatically stable Overview:  Overview:  Mild bilateral hands Right CMC Right hip Left knee s/p meniscus tear All symptomatically stable   Osteoporosis 02/20/2003   Overview:  Bone density 2004 LS -3.32 and RH-1.62 Bone density 2008 LS -3.14 and RH -1.22 Bone density 2009 LS -3.17 and RH -1.21 Bone density 2010 LS -3.14 and RH -1.42  Bone density 2014 LS -2.7 (L3 -3.1) and RH -1.5 Bone density 2017 LS -2.3 and RH -1.4 (neck -1.7) Vitamin D level 38 in 2012 On Boniva 2004 to 2010, restarted 2015 Repeat bone density in 2020 and stop Boniva  Last Assessment & Pl   Prediabetes 02/20/2003   Overview:  Glucose goal < 100, last value 91 in 2017 HgbA1c goal < 6.0, last value 5.6 in 2017 Urine microalbumin negative in 2017 On diet and exercise   Recurrent UTI 10/19/2014   Squamous cell carcinoma of skin 03/14/2019   Left  mid brow. SCCis, hypertrophic. EDC    Surgical History: Past Surgical History:  Procedure Laterality Date   ABDOMINAL HYSTERECTOMY  1975   MASTECTOMY Left    URETEROSCOPY     URETEROSCOPY WITH HOLMIUM LASER LITHOTRIPSY      Home Medications:  Allergies as of 04/21/2022       Reactions   Penicillins Hives   Other reaction(s): Unknown   Ceftin [cefuroxime Axetil] Diarrhea   GI distress   Ramipril    Other reaction(s): Cough Other reaction(s): Cough        Medication List        Accurate as of April 21, 2022 11:25 AM. If you have any questions, ask your nurse or doctor.          STOP taking these medications    trospium 20 MG tablet Commonly known  as: SANCTURA Replaced by: Trospium Chloride 60 MG Cp24       TAKE these medications    amLODipine 5 MG tablet Commonly known as: NORVASC TAKE 1 TABLET BY MOUTH DAILY   cyanocobalamin 1000 MCG tablet Commonly known as: VITAMIN B12 Take 1 tablet (1,000 mcg total) by mouth daily.   escitalopram 10 MG tablet Commonly known as: LEXAPRO TAKE 1 TABLET BY MOUTH DAILY (FOR MOOD/DEPRESSION)   famotidine 20 MG tablet Commonly known as: PEPCID TAKE 1 TABLET BY MOUTH DAILY   levothyroxine 75 MCG tablet Commonly known as: SYNTHROID   mupirocin ointment 2 % Commonly known as: BACTROBAN Apply to skin/breast qd-bid   pravastatin 40 MG tablet Commonly known as: PRAVACHOL TAKE ONE TABLET EACH EVENING   Trospium Chloride 60 MG Cp24 Take 1 capsule (60 mg total) by mouth at bedtime. Replaces: trospium 20 MG tablet        Allergies:  Allergies  Allergen Reactions   Penicillins Hives    Other reaction(s): Unknown   Ceftin [Cefuroxime Axetil] Diarrhea    GI distress   Ramipril     Other reaction(s): Cough Other reaction(s): Cough     Family History: Family History  Problem Relation Age of Onset   Breast cancer Mother    Skin cancer Father     Social History:  reports that she has never smoked. She  has never used smokeless tobacco. She reports that she does not drink alcohol and does not use drugs.  ROS: Pertinent ROS in HPI  Physical Exam: BP 131/81   Pulse 89   Ht '5\' 3"'$  (1.6 m)   Wt 136 lb (61.7 kg)   BMI 24.09 kg/m   Constitutional:  Well nourished. Alert and oriented, No acute distress. HEENT: [place] AT, moist mucus membranes.  Trachea midline Cardiovascular: No clubbing, cyanosis, or edema. Respiratory: Normal respiratory effort, no increased work of breathing. Neurologic: Grossly intact, no focal deficits, moving all 4 extremities. Psychiatric: Normal mood and affect.      Laboratory Data:    Latest Ref Rng & Units 03/22/2022    9:04 AM 04/06/2021   12:19 PM 07/20/2020    8:23 AM  CBC  WBC 4.0 - 10.5 K/uL 6.9  7.2  7.4   Hemoglobin 12.0 - 15.0 g/dL 15.1  14.7  15.0   Hematocrit 36.0 - 46.0 % 46.0  44.2  45.4   Platelets 150.0 - 400.0 K/uL 236.0  226.0  248.0        Latest Ref Rng & Units 03/22/2022    9:04 AM 04/06/2021   12:19 PM 01/13/2021    9:14 AM  BMP  Glucose 70 - 99 mg/dL 99  83  92   BUN 6 - 23 mg/dL '15  16  15   '$ Creatinine 0.40 - 1.20 mg/dL 0.79  0.76  0.76   Sodium 135 - 145 mEq/L 144  140  143   Potassium 3.5 - 5.1 mEq/L 4.0  4.0  4.1   Chloride 96 - 112 mEq/L 105  103  106   CO2 19 - 32 mEq/L '28  27  29   '$ Calcium 8.4 - 10.5 mg/dL 9.4  9.6  9.3     Component     Latest Ref Rng 03/22/2022  Hemoglobin A1C     4.6 - 6.5 % 5.7   I have reviewed the labs.    Pertinent Imaging:  04/21/22 10:09  Scan Result 52m     Assessment & Plan:  1. Nocturia -Has had some nights where she is reduced to 1 time nightly -At this time she will move the trospium IR 20 mg and take it right before bedtime at 10 PM and then she will take her next dose the next day at 10 AM ideally keeping doses 12 hours apart -I have sent a prescription in for trospium XL 60 mg to take nightly, but I am concerned her insurance may not cover the medication -If the 60 mg dose is too  costly, she will stick with the 20 mg twice daily  Return in about 3 months (around 07/21/2022) for PVR and OAB questionnaire.  These notes generated with voice recognition software. I apologize for typographical errors.  [place], [place] [address]  [place], [place] [address] [phone]

## 2022-04-21 ENCOUNTER — Ambulatory Visit (INDEPENDENT_AMBULATORY_CARE_PROVIDER_SITE_OTHER): Payer: Medicare HMO | Admitting: Urology

## 2022-04-21 ENCOUNTER — Encounter: Payer: Self-pay | Admitting: Urology

## 2022-04-21 VITALS — BP 131/81 | HR 89 | Ht 63.0 in | Wt 136.0 lb

## 2022-04-21 DIAGNOSIS — R351 Nocturia: Secondary | ICD-10-CM

## 2022-04-21 LAB — BLADDER SCAN AMB NON-IMAGING

## 2022-04-21 MED ORDER — TROSPIUM CHLORIDE ER 60 MG PO CP24: 60.0000 mg | ORAL_CAPSULE | Freq: Every evening | ORAL | 11 refills | Status: DC

## 2022-04-21 NOTE — Patient Instructions (Signed)
Trospium 20 mg take one at 10 pm and one at 10 am   If you get the trospium 60 mg take one at night

## 2022-05-02 NOTE — Progress Notes (Deleted)
05/03/2022 12:55 PM   Jennifer Bernard 1927-05-06 [id number]  Referring provider: Einar Pheasant, [address] [place],  [place] [address]  Urological history: 1. Nocturia -contributing risk factors of obstructive sleep apnea, hypertension, diabetes, arthritis, asthma, anxiety and depression -PVR 0 mL  -failed trospium IR 20 mg qhs  2.  Nephrolithiasis -Left URS, 2016  3. rUTI's -Contributing factors of age, vaginal atrophy, diabetes -documented positive urine cultures over the last year  05/05/2021 - e.coli   No chief complaint on file.   HPI: Jennifer Bernard is a 88 y.o. female who presents today for frequency and not feeling good.  UA *** PVR ***  PMH: Past Medical History:  Diagnosis Date   Asymptomatic menopausal state 02/20/1984   Overview:  Naturally occuring and asymptomatic Vaginal atrophy present on topical estrogen Overview:  Overview:  Naturally occuring and asymptomatic Vaginal atrophy present on topical estrogen   Basal cell carcinoma of skin 02/19/2013   Overview:  Basal cell on her nasolabial fold 2014 S/p Mohs by Dr. Lacinda Axon Followed by local dermatologist regularly Overview:  Overview:  Followed by Dr. Lacinda Axon   Cyst of ovary 02/19/2009   Overview:  Ovarian cyst on right stable 2004-2010 Cyst ? larger on CT 2012 Stable on follow up US Followed by Dr. Edwinna Areola Overview:  Overview:  Ovarian cyst on right stable 2004-2010 Cyst ? larger on CT 2012 Stable on follow up US Followed by Dr. Edwinna Areola   Depression, major, single episode, complete remission ([place]) 07/18/2017   Diffuse cystic mastopathy of breast 02/19/2013   Overview:  Mother with breast cancer later in life Two breast biopsies both with normal pathology Annual mammograms recommended, last 2016 Followed at the [facility]:  Relevant Hx: Course: Daily Update: Today's Plan: Overview:  Overview:  Mother with breast cancer later in life Two  breast biopsies both with normal pathology Annual mammograms recommended, last 201   Essential (primary) hypertension 02/19/2013   Overview:  Goal blood pressure < 135/85 On atenolol Overview:  Overview:  Goal blood pressure < 135/85 On atenolol   Hyperlipidemia 02/19/2013   Overview:  Goal LDL < 130, last value 92 in 2017 On Pravachol Overview:  Overview:  Goal LDL < 130, last value 82 in 2012 On Pravachol   Hypothyroidism 02/28/2012   Overview:  started on T4  TFT normal 2017 Followed by Dr. Almon Hercules Overview:  Overview:  started on T4  TFT normal 2014 Followed by Dr. Almon Hercules   Impaired glucose tolerance 02/20/2003   Overview:  Overview:  Glucose goal < 100, last value 94 in 2014 HgbA1c goal < 6.0, last value 5.3 in 2014 Urine microalbumin negative in 2014 On diet and exercise   Nontoxic multinodular goiter 03/06/2003   Overview:  Asymptomatic nodular thyroid Stable [place] 2004-2014 Followed by Dr. Almon Hercules Overview:  Overview:  Asymptomatic nodular thyroid Stable [place] 2004-2014 Followed by Dr. Almon Hercules   OSA on CPAP 01/13/2015   Overview:  Sleep study sent to medical records:   Sleep efficiency 22%, latency 24 min, # awakenings 11, res desat N=10, index 17.8  Apnea 1 for 16 sec, hypopnea 16 for 18 sec, total sleep time only 90 min so index = 11 She did not sleep much and the index puts her in mild category Repeat study a few days later when she slept 187 min her AHI = 4.6 which is normal, average O2 sat 94% I don't think  Osteoarthritis 02/20/2003   Overview:  Mild bilateral hands Right CMC Right hip Left knee s/p meniscus tear All symptomatically stable Overview:  Overview:  Mild bilateral hands Right CMC Right hip Left knee s/p meniscus tear All symptomatically stable   Osteoporosis 02/20/2003   Overview:  Bone density 2004 LS -3.32 and RH-1.62 Bone density 2008 LS -3.14 and RH -1.22 Bone density 2009 LS -3.17 and RH -1.21 Bone density 2010 LS -3.14 and RH -1.42  Bone density 2014 LS -2.7 (L3 -3.1) and RH -1.5  Bone density 2017 LS -2.3 and RH -1.4 (neck -1.7) Vitamin D level 38 in 2012 On Boniva 2004 to 2010, restarted 2015 Repeat bone density in 2020 and stop Boniva  Last Assessment & Pl   Prediabetes 02/20/2003   Overview:  Glucose goal < 100, last value 91 in 2017 HgbA1c goal < 6.0, last value 5.6 in 2017 Urine microalbumin negative in 2017 On diet and exercise   Recurrent UTI 10/19/2014   Squamous cell carcinoma of skin 03/14/2019   Left mid brow. SCCis, hypertrophic. EDC    Surgical History: Past Surgical History:  Procedure Laterality Date   ABDOMINAL HYSTERECTOMY  1975   MASTECTOMY Left    URETEROSCOPY     URETEROSCOPY WITH HOLMIUM LASER LITHOTRIPSY      Home Medications:  Allergies as of 05/03/2022       Reactions   Penicillins Hives   Other reaction(s): Unknown   Ceftin [cefuroxime Axetil] Diarrhea   GI distress   Ramipril    Other reaction(s): Cough Other reaction(s): Cough        Medication List        Accurate as of May 02, 2022 12:55 PM. If you have any questions, ask your nurse or doctor.          amLODipine 5 MG tablet Commonly known as: NORVASC TAKE 1 TABLET BY MOUTH DAILY   cyanocobalamin 1000 MCG tablet Commonly known as: VITAMIN B12 Take 1 tablet (1,000 mcg total) by mouth daily.   escitalopram 10 MG tablet Commonly known as: LEXAPRO TAKE 1 TABLET BY MOUTH DAILY (FOR MOOD/DEPRESSION)   famotidine 20 MG tablet Commonly known as: PEPCID TAKE 1 TABLET BY MOUTH DAILY   levothyroxine 75 MCG tablet Commonly known as: SYNTHROID   mupirocin ointment 2 % Commonly known as: BACTROBAN Apply to skin/breast qd-bid   pravastatin 40 MG tablet Commonly known as: PRAVACHOL TAKE ONE TABLET EACH EVENING   Trospium Chloride 60 MG Cp24 Take 1 capsule (60 mg total) by mouth at bedtime.        Allergies:  Allergies  Allergen Reactions   Penicillins Hives    Other reaction(s): Unknown   Ceftin [Cefuroxime Axetil] Diarrhea    GI distress    Ramipril     Other reaction(s): Cough Other reaction(s): Cough     Family History: Family History  Problem Relation Age of Onset   Breast cancer Mother    Skin cancer Father     Social History:  reports that she has never smoked. She has never used smokeless tobacco. She reports that she does not drink alcohol and does not use drugs.  ROS: Pertinent ROS in HPI  Physical Exam: There were no vitals taken for this visit.  Constitutional:  Well nourished. Alert and oriented, No acute distress. HEENT: [place] AT, moist mucus membranes.  Trachea midline, no masses. Cardiovascular: No clubbing, cyanosis, or edema. Respiratory: Normal respiratory effort, no increased work of breathing. GI: Abdomen is soft, non tender,  non distended, no abdominal masses. Liver and spleen not palpable.  No hernias appreciated.  Stool sample for occult testing is not indicated.   GU: No CVA tenderness.  No bladder fullness or masses.  *** external genitalia, *** pubic hair distribution, no lesions.  Normal urethral meatus, no lesions, no prolapse, no discharge.   No urethral masses, tenderness and/or tenderness. No bladder fullness, tenderness or masses. *** vagina mucosa, *** estrogen effect, no discharge, no lesions, *** pelvic support, *** cystocele and *** rectocele noted.  No cervical motion tenderness.  Uterus is freely mobile and non-fixed.  No adnexal/parametria masses or tenderness noted.  Anus and perineum are without rashes or lesions.   ***  Skin: No rashes, bruises or suspicious lesions. Lymph: No cervical or inguinal adenopathy. Neurologic: Grossly intact, no focal deficits, moving all 4 extremities. Psychiatric: Normal mood and affect.    Laboratory Data: Urinalysis *** I have reviewed the labs.    Pertinent Imaging:  04/21/22 10:09  Scan Result 42m     Assessment & Plan:    1. Nocturia -Has had some nights where she is reduced to 1 time nightly -At this time she will move the trospium  IR 20 mg and take it right before bedtime at 10 PM and then she will take her next dose the next day at 10 AM ideally keeping doses 12 hours apart -I have sent a prescription in for trospium XL 60 mg to take nightly, but I am concerned her insurance may not cover the medication -If the 60 mg dose is too costly, she will stick with the 20 mg twice daily  No follow-ups on file.  These notes generated with voice recognition software. I apologize for typographical errors.  [place] [address] [place] [phone]

## 2022-05-03 ENCOUNTER — Ambulatory Visit: Payer: Medicare HMO | Admitting: Urology

## 2022-05-03 ENCOUNTER — Telehealth: Payer: Self-pay | Admitting: *Deleted

## 2022-05-03 DIAGNOSIS — R351 Nocturia: Secondary | ICD-10-CM

## 2022-05-03 NOTE — Telephone Encounter (Signed)
Pt came to [facility] office by mistake today, pt states she is now taking '60mg'$  trospium, and still getting up at night. I advised it make take a little longer to work. Pt is going out of town and just wanted to know what to do. Per shannon pt can be seen tomorrow at 10:30am I will call to see if she can come.   Spoke with patient and advised results Appt scheduled

## 2022-05-04 ENCOUNTER — Encounter: Payer: Self-pay | Admitting: Urology

## 2022-05-04 ENCOUNTER — Ambulatory Visit (INDEPENDENT_AMBULATORY_CARE_PROVIDER_SITE_OTHER): Payer: Medicare HMO | Admitting: Urology

## 2022-05-04 VITALS — BP 136/82 | HR 96 | Ht 63.0 in | Wt 136.0 lb

## 2022-05-04 DIAGNOSIS — Z87440 Personal history of urinary (tract) infections: Secondary | ICD-10-CM

## 2022-05-04 DIAGNOSIS — N390 Urinary tract infection, site not specified: Secondary | ICD-10-CM

## 2022-05-04 DIAGNOSIS — R351 Nocturia: Secondary | ICD-10-CM

## 2022-05-04 DIAGNOSIS — R5383 Other fatigue: Secondary | ICD-10-CM

## 2022-05-04 LAB — BLADDER SCAN AMB NON-IMAGING

## 2022-05-04 NOTE — Progress Notes (Signed)
05/04/2022 11:10 AM   Jennifer Bernard 10-22-1926 [id number]  Referring provider: Einar Pheasant, [address] [place],  [place] [address]  Urological history: 1. Nocturia -contributing risk factors of obstructive sleep apnea, hypertension, diabetes, arthritis, asthma, anxiety and depression -PVR 0 mL  -failed trospium IR 20 mg qhs  2.  Nephrolithiasis -Left URS, 2016  3. rUTI's -Contributing factors of age, vaginal atrophy, diabetes -documented positive urine cultures over the last year  05/05/2021 - e.coli   Chief Complaint  Patient presents with   Recurrent UTI    HPI: Jennifer Bernard is a 88 y.o. female who presents today for frequency and not feeling good w/ her daughter, Jennifer Bernard.    She is taking the [place] XL 60 mg qhs.  She filled the prescription on 04/21/2022 according to Epic.  She was having nocturia x 2.  She got up only one time last night.  She states for the last two months, she has felt fatigue and had a slight cough.  Patient denies any modifying or aggravating factors.  Patient denies any gross hematuria, dysuria or suprapubic/flank pain.  Patient denies any fevers, chills, nausea or vomiting.    She does not sleep with her CPAP machine.  PVR 237 mL  PMH: Past Medical History:  Diagnosis Date   Asymptomatic menopausal state 02/20/1984   Overview:  Naturally occuring and asymptomatic Vaginal atrophy present on topical estrogen Overview:  Overview:  Naturally occuring and asymptomatic Vaginal atrophy present on topical estrogen   Basal cell carcinoma of skin 02/19/2013   Overview:  Basal cell on her nasolabial fold 2014 S/p Mohs by Dr. Lacinda Axon Followed by local dermatologist regularly Overview:  Overview:  Followed by Dr. Lacinda Axon   Cyst of ovary 02/19/2009   Overview:  Ovarian cyst on right stable 2004-2010 Cyst ? larger on CT 2012 Stable on follow up US Followed by Dr. Edwinna Areola Overview:  Overview:  Ovarian cyst on right stable  2004-2010 Cyst ? larger on CT 2012 Stable on follow up US Followed by Dr. Edwinna Areola   Depression, major, single episode, complete remission ([place]) 07/18/2017   Diffuse cystic mastopathy of breast 02/19/2013   Overview:  Mother with breast cancer later in life Two breast biopsies both with normal pathology Annual mammograms recommended, last 2016 Followed at the [facility]:  Relevant Hx: Course: Daily Update: Today's Plan: Overview:  Overview:  Mother with breast cancer later in life Two breast biopsies both with normal pathology Annual mammograms recommended, last 201   Essential (primary) hypertension 02/19/2013   Overview:  Goal blood pressure < 135/85 On atenolol Overview:  Overview:  Goal blood pressure < 135/85 On atenolol   Hyperlipidemia 02/19/2013   Overview:  Goal LDL < 130, last value 92 in 2017 On Pravachol Overview:  Overview:  Goal LDL < 130, last value 82 in 2012 On Pravachol   Hypothyroidism 02/28/2012   Overview:  started on T4  TFT normal 2017 Followed by Dr. Almon Hercules Overview:  Overview:  started on T4  TFT normal 2014 Followed by Dr. Almon Hercules   Impaired glucose tolerance 02/20/2003   Overview:  Overview:  Glucose goal < 100, last value 94 in 2014 HgbA1c goal < 6.0, last value 5.3 in 2014 Urine microalbumin negative in 2014 On diet and exercise   Nontoxic multinodular goiter 03/06/2003   Overview:  Asymptomatic nodular thyroid Stable [place] 2004-2014 Followed by Dr. Almon Hercules Overview:  Overview:  Asymptomatic  nodular thyroid Stable [place] 2004-2014 Followed by Dr. Almon Hercules   OSA on CPAP 01/13/2015   Overview:  Sleep study sent to medical records:   Sleep efficiency 22%, latency 24 min, # awakenings 11, res desat N=10, index 17.8  Apnea 1 for 16 sec, hypopnea 16 for 18 sec, total sleep time only 90 min so index = 11 She did not sleep much and the index puts her in mild category Repeat study a few days later when she slept 187 min her AHI = 4.6 which is normal,  average O2 sat 94% I don't think    Osteoarthritis 02/20/2003   Overview:  Mild bilateral hands Right CMC Right hip Left knee s/p meniscus tear All symptomatically stable Overview:  Overview:  Mild bilateral hands Right CMC Right hip Left knee s/p meniscus tear All symptomatically stable   Osteoporosis 02/20/2003   Overview:  Bone density 2004 LS -3.32 and RH-1.62 Bone density 2008 LS -3.14 and RH -1.22 Bone density 2009 LS -3.17 and RH -1.21 Bone density 2010 LS -3.14 and RH -1.42  Bone density 2014 LS -2.7 (L3 -3.1) and RH -1.5 Bone density 2017 LS -2.3 and RH -1.4 (neck -1.7) Vitamin D level 38 in 2012 On Boniva 2004 to 2010, restarted 2015 Repeat bone density in 2020 and stop Boniva  Last Assessment & Pl   Prediabetes 02/20/2003   Overview:  Glucose goal < 100, last value 91 in 2017 HgbA1c goal < 6.0, last value 5.6 in 2017 Urine microalbumin negative in 2017 On diet and exercise   Recurrent UTI 10/19/2014   Squamous cell carcinoma of skin 03/14/2019   Left mid brow. SCCis, hypertrophic. EDC    Surgical History: Past Surgical History:  Procedure Laterality Date   ABDOMINAL HYSTERECTOMY  1975   MASTECTOMY Left    URETEROSCOPY     URETEROSCOPY WITH HOLMIUM LASER LITHOTRIPSY      Home Medications:  Allergies as of 05/04/2022       Reactions   Penicillins Hives   Other reaction(s): Unknown   Ceftin [cefuroxime Axetil] Diarrhea   GI distress   Ramipril    Other reaction(s): Cough Other reaction(s): Cough        Medication List        Accurate as of May 04, 2022 11:10 AM. If you have any questions, ask your nurse or doctor.          amLODipine 5 MG tablet Commonly known as: NORVASC TAKE 1 TABLET BY MOUTH DAILY   cyanocobalamin 1000 MCG tablet Commonly known as: VITAMIN B12 Take 1 tablet (1,000 mcg total) by mouth daily.   escitalopram 10 MG tablet Commonly known as: LEXAPRO TAKE 1 TABLET BY MOUTH DAILY (FOR MOOD/DEPRESSION)   famotidine 20 MG tablet Commonly  known as: PEPCID TAKE 1 TABLET BY MOUTH DAILY   levothyroxine 75 MCG tablet Commonly known as: SYNTHROID   mupirocin ointment 2 % Commonly known as: BACTROBAN Apply to skin/breast qd-bid   pravastatin 40 MG tablet Commonly known as: PRAVACHOL TAKE ONE TABLET EACH EVENING   Trospium Chloride 60 MG Cp24 Take 1 capsule (60 mg total) by mouth at bedtime.        Allergies:  Allergies  Allergen Reactions   Penicillins Hives    Other reaction(s): Unknown   Ceftin [Cefuroxime Axetil] Diarrhea    GI distress   Ramipril     Other reaction(s): Cough Other reaction(s): Cough     Family History: Family History  Problem Relation Age of Onset  Breast cancer Mother    Skin cancer Father     Social History:  reports that she has never smoked. She has never used smokeless tobacco. She reports that she does not drink alcohol and does not use drugs.  ROS: Pertinent ROS in HPI  Physical Exam: BP 136/82   Pulse 96   Ht '5\' 3"'$  (1.6 m)   Wt 136 lb (61.7 kg)   BMI 24.09 kg/m   Constitutional:  Well nourished. Alert and oriented, No acute distress. HEENT: [place] AT, moist mucus membranes.  Trachea midline Cardiovascular: No clubbing, cyanosis, or edema. Respiratory: Normal respiratory effort, no increased work of breathing. Neurologic: Grossly intact, no focal deficits, moving all 4 extremities. Psychiatric: Normal mood and affect.    Laboratory Data: N/A    Pertinent Imaging:  05/04/22 10:51  Scan Result 224m    Assessment & Plan:    1. Nocturia -Reviewed the pathophysiology of sleep apnea and how it contributes to nocturia  2. Fatigue -Per [facility], SCloyd Stagersdoes have a side effect of fatigue so we will go ahead and discontinue that medication at this time -I also explained how untreated sleep apnea can contribute to fatigue she does not get the oxygen she needs to get a good nights rest -I also encouraged her to have a COVID test as there has been a rise in [place] recently and she has fatigue with a cough  Return in about 1 month (around 06/03/2022) for PVR and OAB questionnaire.  These notes generated with voice recognition software. I apologize for typographical errors.  SRoyden Purl [address] [place] [phone]  I spent 19 minutes on the day of the encounter to include pre-visit record review, face-to-face time with the patient, and post-visit ordering of tests.

## 2022-05-11 ENCOUNTER — Telehealth: Payer: Self-pay | Admitting: Genetic Counselor

## 2022-05-11 NOTE — Telephone Encounter (Signed)
Daughter called to set up appt for mother as she is most informative person in the family to test.  Phone call scheduled 9/28 at [facility].  Requested that we call daughter's cell phone at [phone]

## 2022-05-12 ENCOUNTER — Other Ambulatory Visit: Payer: Self-pay | Admitting: Genetic Counselor

## 2022-05-12 ENCOUNTER — Inpatient Hospital Stay: Payer: Medicare HMO | Attending: Genetic Counselor | Admitting: Genetic Counselor

## 2022-05-12 ENCOUNTER — Encounter: Payer: Self-pay | Admitting: Genetic Counselor

## 2022-05-12 DIAGNOSIS — Z803 Family history of malignant neoplasm of breast: Secondary | ICD-10-CM | POA: Diagnosis not present

## 2022-05-12 DIAGNOSIS — Z853 Personal history of malignant neoplasm of breast: Secondary | ICD-10-CM

## 2022-05-12 HISTORY — DX: Family history of malignant neoplasm of breast: Z80.3

## 2022-05-12 NOTE — Progress Notes (Addendum)
REFERRING PROVIDER: Self-referred  PRIMARY PROVIDER:  Einar Pheasant, MD  PRIMARY REASON FOR VISIT:  Encounter Diagnoses  Name Primary?   History of breast cancer Yes   Family history of breast cancer     HISTORY OF PRESENT ILLNESS:   Jennifer Bernard, a 88 y.o. female, was seen for a [facility] cancer genetics consultation due to a personal and family history of breast cancer.  Her daughter, Eustaquio Maize, was previously seen in genetics clinic and was informed that, if the family is interested in genetic testing, Jennifer Bernard is the most informative person in the family to test. Jennifer Bernard presents to clinic today to discuss the possibility of a hereditary predisposition to cancer, to discuss genetic testing, and to further clarify potential cancer risks for family members.   At the age of 89, Jennifer Bernard was diagnosed with breast cancer s/p mastectomy. She also has a history of non-melanoma skin cancer on her face diagnosed in her 35s or 77s.    Past Medical History:  Diagnosis Date   Asymptomatic menopausal state 02/20/1984   Overview:  Naturally occuring and asymptomatic Vaginal atrophy present on topical estrogen Overview:  Overview:  Naturally occuring and asymptomatic Vaginal atrophy present on topical estrogen   Basal cell carcinoma of skin 02/19/2013   Overview:  Basal cell on her nasolabial fold 2014 S/p Mohs by Dr. Lacinda Axon Followed by local dermatologist regularly Overview:  Overview:  Followed by Dr. Lacinda Axon   Cyst of ovary 02/19/2009   Overview:  Ovarian cyst on right stable 2004-2010 Cyst ? larger on CT 2012 Stable on follow up US Followed by Dr. Edwinna Areola Overview:  Overview:  Ovarian cyst on right stable 2004-2010 Cyst ? larger on CT 2012 Stable on follow up US Followed by Dr. Edwinna Areola   Depression, major, single episode, complete remission ([facility]) 07/18/2017   Diffuse cystic mastopathy of breast 02/19/2013   Overview:  Mother with breast cancer later in life Two breast biopsies both with  normal pathology Annual mammograms recommended, last 2016 Followed at the [facility]:  Relevant Hx: Course: Daily Update: Today's Plan: Overview:  Overview:  Mother with breast cancer later in life Two breast biopsies both with normal pathology Annual mammograms recommended, last 201   Essential (primary) hypertension 02/19/2013   Overview:  Goal blood pressure < 135/85 On atenolol Overview:  Overview:  Goal blood pressure < 135/85 On atenolol   Hyperlipidemia 02/19/2013   Overview:  Goal LDL < 130, last value 92 in 2017 On Pravachol Overview:  Overview:  Goal LDL < 130, last value 82 in 2012 On Pravachol   Hypothyroidism 02/28/2012   Overview:  started on T4  TFT normal 2017 Followed by Dr. Almon Hercules Overview:  Overview:  started on T4  TFT normal 2014 Followed by Dr. Almon Hercules   Impaired glucose tolerance 02/20/2003   Overview:  Overview:  Glucose goal < 100, last value 94 in 2014 HgbA1c goal < 6.0, last value 5.3 in 2014 Urine microalbumin negative in 2014 On diet and exercise   Nontoxic multinodular goiter 03/06/2003   Overview:  Asymptomatic nodular thyroid Stable [place] 2004-2014 Followed by Dr. Almon Hercules Overview:  Overview:  Asymptomatic nodular thyroid Stable [place] 2004-2014 Followed by Dr. Almon Hercules   OSA on CPAP 01/13/2015   Overview:  Sleep study sent to medical records:   Sleep efficiency 22%, latency 24 min, # awakenings 11, res desat N=10, index 17.8  Apnea 1 for 16 sec, hypopnea 16 for  18 sec, total sleep time only 90 min so index = 11 She did not sleep much and the index puts her in mild category Repeat study a few days later when she slept 187 min her AHI = 4.6 which is normal, average O2 sat 94% I don't think    Osteoarthritis 02/20/2003   Overview:  Mild bilateral hands Right CMC Right hip Left knee s/p meniscus tear All symptomatically stable Overview:  Overview:  Mild bilateral hands Right CMC Right hip Left knee s/p meniscus tear All symptomatically stable    Osteoporosis 02/20/2003   Overview:  Bone density 2004 LS -3.32 and RH-1.62 Bone density 2008 LS -3.14 and RH -1.22 Bone density 2009 LS -3.17 and RH -1.21 Bone density 2010 LS -3.14 and RH -1.42  Bone density 2014 LS -2.7 (L3 -3.1) and RH -1.5 Bone density 2017 LS -2.3 and RH -1.4 (neck -1.7) Vitamin D level 38 in 2012 On Boniva 2004 to 2010, restarted 2015 Repeat bone density in 2020 and stop Boniva  Last Assessment & Pl   Prediabetes 02/20/2003   Overview:  Glucose goal < 100, last value 91 in 2017 HgbA1c goal < 6.0, last value 5.6 in 2017 Urine microalbumin negative in 2017 On diet and exercise   Recurrent UTI 10/19/2014   Squamous cell carcinoma of skin 03/14/2019   Left mid brow. SCCis, hypertrophic. EDC    Past Surgical History:  Procedure Laterality Date   ABDOMINAL HYSTERECTOMY  1975   MASTECTOMY Left    URETEROSCOPY     URETEROSCOPY WITH HOLMIUM LASER LITHOTRIPSY      FAMILY HISTORY:  We obtained a detailed, 4-generation family history.  Significant diagnoses are listed below: Family History  Problem Relation Age of Onset   Breast cancer Mother        dx late 69s   Skin cancer Father        non-melanoma; head   Liver cancer Sister        or other primary: d. 61   Breast cancer Maternal Grandmother        dx late 4s   Skin cancer Daughter        non-melanoma          Jennifer Bernard is unaware of previous family history of genetic testing for hereditary cancer risks. There is no reported Ashkenazi Jewish ancestry. There is no known consanguinity.  GENETIC COUNSELING ASSESSMENT: Jennifer Bernard is a 88 y.o. female with a personal and family history which is not highly suggestive of a hereditary cancer syndrome.   DISCUSSION:   We discussed that, in general, most cancer is not inherited in families, but instead is sporadic or familial. Sporadic cancers occur by chance and typically happen at older ages (>50 years) as this type of cancer is caused by genetic changes acquired  during an individual's lifetime. Some families have more cancers than would be expected by chance; however, the ages or types of cancer are not consistent with a known genetic mutation or known genetic mutations have been ruled out. This type of familial cancer is thought to be due to a combination of multiple genetic, environmental, hormonal, and lifestyle factors. While this combination of factors likely increases the risk of cancer, the exact source of this risk is not currently identifiable or testable.    We discussed that approximately 5-10% of cancer cancer is hereditary, meaning that it is due to a mutation in a single gene that is passed down from generation to generation in  a family. Most hereditary cases of breast cancer are associated with mutations in BRCA1/2. There are other genes that can be associated an increased risk for breast cancer, including but not limited to PALB2, CHEK2, and ATM. We discussed that testing can be beneficial for several reasons, including knowing about other cancer risks, identifying potential screening and risk-reduction options that may be appropriate, and to understand if other family members could be at risk for cancer and allow them to undergo genetic testing.  We discussed with Jennifer Bernard that the personal and family history does not meet NCCN criteria for genetic testing and, therefore, is not highly consistent with a familial hereditary cancer syndrome.  We feel she is at low risk to harbor a gene mutation associated with such a condition. However, we discussed that it is reasonable to consider genetic testing given her personal history of breast cancer and limited information about her sister's cancer diagnosis.   We reviewed the characteristics, features and inheritance patterns of hereditary cancer syndromes. We also discussed genetic testing, including the appropriate family members to test, the process of testing, insurance coverage and turn-around-time for  results. We discussed the implications of a negative, positive, carrier and/or variant of uncertain significant result. We recommended, if interested, Jennifer Bernard pursue genetic testing for the [facility] Common Hereditary Cancers +RNA Panel.    The Common Hereditary Cancers + RNA Panel offered by [facility] includes sequencing, deletion/duplication, and RNA testing of the following 47 genes: APC, ATM, AXIN2, BARD1, BMPR1A, BRCA1, BRCA2, BRIP1, CDH1, CDK4*, CDKN2A (p14ARF)*, CDKN2A (p16INK4a)*, CHEK2, CTNNA1, DICER1, EPCAM (Deletion/duplication testing only), GREM1 (promoter region deletion/duplication testing only), KIT, MEN1, MLH1, MSH2, MSH3, MSH6, MUTYH, NBN, NF1, NHTL1, PALB2, PDGFRA*, PMS2, POLD1, POLE, PTEN, RAD50, RAD51C, RAD51D, SDHB, SDHC, SDHD, SMAD4, SMARCA4. STK11, TP53, TSC1, TSC2, and VHL.  The following genes were evaluated for sequence changes only: SDHA and HOXB13 c.251G>A variant only.  RNA analysis is not performed for the * genes.    PLAN: After considering the risks, benefits, and limitations, Jennifer Bernard provided informed consent to pursue genetic testing.  She will have blood sample collected at [facility] on May 17, 2022 at 2pm, which will then be sent to [facility] for analysis of the Common Hereditary Cancers +RNA Panel. Results should be available within approximately 3 weeks' time, at which point they will be disclosed by telephone to Jennifer Bernard's daughter, Eustaquio Maize, (per Ms. Marquis's request), as will any additional recommendations warranted by these results. Jennifer Bernard will receive a summary of her genetic counseling visit and a copy of her results once available. This information will also be available in Epic.   Jennifer Bernard questions were answered to her satisfaction today. Our contact information was provided should additional questions or concerns arise. Thank you for the referral and allowing [place] to share in the care of your patient.   Jennifer Bernard M. Joette Catching,  [place], [facility] Genetic Counselor [email or web address] (P) [phone]  The patient was seen for a total of 17 minutes of telephone genetic counseling.  Patient was accompanied by her daughter, Eustaquio Maize. Drs. Lindi Adie and/or Burr Medico were available to discuss this case as needed.    _______________________________________________________________________ For Office Staff:  Number of people involved in session: 2 Was an Intern/ student involved with case: yes

## 2022-05-13 ENCOUNTER — Telehealth: Payer: Self-pay

## 2022-05-13 NOTE — Telephone Encounter (Signed)
Patient's daughter, Luan Moore, called to state she had called to request Dr. Einar Pheasant write a prescription for patient to have a walker.  Beth states that we did mail her that prescription, but the prescription is for [employer] instead of [employer].  Beth states she would like for [place] to please write a prescription for Starkeisha "Inez Catalina" Baumgarner to have a walker.  Beth states we may call her when it's ready and she will come by the office and pick it up.

## 2022-05-14 NOTE — Telephone Encounter (Signed)
Please change the prescription to [facility].  Apparently the message was sent through the daughter's chart and the prescription was written through that chart.  Need DME for Jennifer Bernard.

## 2022-05-16 ENCOUNTER — Other Ambulatory Visit: Payer: Self-pay

## 2022-05-16 DIAGNOSIS — Z853 Personal history of malignant neoplasm of breast: Secondary | ICD-10-CM

## 2022-05-16 DIAGNOSIS — R7302 Impaired glucose tolerance (oral): Secondary | ICD-10-CM

## 2022-05-16 DIAGNOSIS — I10 Essential (primary) hypertension: Secondary | ICD-10-CM

## 2022-05-16 DIAGNOSIS — R5383 Other fatigue: Secondary | ICD-10-CM

## 2022-05-16 NOTE — Telephone Encounter (Signed)
Wrote an order for a 4 wheeled walker with a seat for this Patient. Please sign

## 2022-05-17 ENCOUNTER — Inpatient Hospital Stay: Payer: Medicare HMO | Attending: Genetic Counselor

## 2022-05-17 ENCOUNTER — Telehealth: Payer: Self-pay

## 2022-05-17 ENCOUNTER — Other Ambulatory Visit: Payer: Self-pay

## 2022-05-17 DIAGNOSIS — Z853 Personal history of malignant neoplasm of breast: Secondary | ICD-10-CM | POA: Diagnosis not present

## 2022-05-17 DIAGNOSIS — R7302 Impaired glucose tolerance (oral): Secondary | ICD-10-CM

## 2022-05-17 DIAGNOSIS — Z803 Family history of malignant neoplasm of breast: Secondary | ICD-10-CM | POA: Diagnosis not present

## 2022-05-17 DIAGNOSIS — I10 Essential (primary) hypertension: Secondary | ICD-10-CM

## 2022-05-17 DIAGNOSIS — R5383 Other fatigue: Secondary | ICD-10-CM

## 2022-05-17 DIAGNOSIS — R0602 Shortness of breath: Secondary | ICD-10-CM

## 2022-05-17 NOTE — Telephone Encounter (Signed)
Patient's daughter picked up the prescription for the walker.

## 2022-05-17 NOTE — Telephone Encounter (Signed)
Prescription is in and pended. Sign please and I will print and call the Patient's daughter to pick it up.

## 2022-05-17 NOTE — Telephone Encounter (Signed)
I signed the prescription yesterday. Do not see anything pended.  Thanks

## 2022-05-17 NOTE — Telephone Encounter (Signed)
Do not see prescription.  Need to notify pts daughter when rx is ready.

## 2022-05-17 NOTE — Telephone Encounter (Signed)
Patient's daughter picked up the prescription.

## 2022-05-24 ENCOUNTER — Telehealth: Payer: Self-pay

## 2022-05-24 NOTE — Telephone Encounter (Signed)
I have spot on hold for tomorrow & patient will call back by end if day to let us know if she can make that appointment.

## 2022-05-24 NOTE — Telephone Encounter (Signed)
Can she come in tomorrow at 9:00

## 2022-05-24 NOTE — Telephone Encounter (Signed)
Pt scheduled for tomorrow at 9.

## 2022-05-24 NOTE — Telephone Encounter (Signed)
Patient states she has been experiencing fatigue and hasn't felt real good for about 3 weeks, on and off.  Patient states she is getting up once or twice a night to go to the bathroom.  Patient states she has seen the urologist, but she is not very happy with her and wanted to talk with Dr. Einar Pheasant about it.  Patient states she was calling to see if she can get in to see Dr. Nicki Reaper sooner.  I let patient know that we do have some 7am appointments available earlier in December.  Patient states that she lives at [facility] at [place], and she is not sure their transportation will be available that early.  Patient states she will keep her appointment on 07/25/2022 for now.  I did add patient to the wait list.

## 2022-05-25 ENCOUNTER — Encounter: Payer: Self-pay | Admitting: Internal Medicine

## 2022-05-25 ENCOUNTER — Ambulatory Visit (INDEPENDENT_AMBULATORY_CARE_PROVIDER_SITE_OTHER): Payer: Medicare HMO | Admitting: Internal Medicine

## 2022-05-25 VITALS — BP 122/72 | HR 92 | Temp 98.5°F | Ht 63.0 in | Wt 137.6 lb

## 2022-05-25 DIAGNOSIS — G4733 Obstructive sleep apnea (adult) (pediatric): Secondary | ICD-10-CM | POA: Diagnosis not present

## 2022-05-25 DIAGNOSIS — I10 Essential (primary) hypertension: Secondary | ICD-10-CM

## 2022-05-25 DIAGNOSIS — E785 Hyperlipidemia, unspecified: Secondary | ICD-10-CM | POA: Diagnosis not present

## 2022-05-25 DIAGNOSIS — E042 Nontoxic multinodular goiter: Secondary | ICD-10-CM | POA: Diagnosis not present

## 2022-05-25 DIAGNOSIS — I77810 Thoracic aortic ectasia: Secondary | ICD-10-CM

## 2022-05-25 DIAGNOSIS — E039 Hypothyroidism, unspecified: Secondary | ICD-10-CM

## 2022-05-25 DIAGNOSIS — H04123 Dry eye syndrome of bilateral lacrimal glands: Secondary | ICD-10-CM

## 2022-05-25 DIAGNOSIS — R739 Hyperglycemia, unspecified: Secondary | ICD-10-CM | POA: Diagnosis not present

## 2022-05-25 DIAGNOSIS — R351 Nocturia: Secondary | ICD-10-CM

## 2022-05-25 DIAGNOSIS — Z23 Encounter for immunization: Secondary | ICD-10-CM

## 2022-05-25 DIAGNOSIS — R5383 Other fatigue: Secondary | ICD-10-CM

## 2022-05-25 MED ORDER — PANTOPRAZOLE SODIUM 40 MG PO TBEC: DELAYED_RELEASE_TABLET | ORAL | 2 refills | Status: DC

## 2022-05-25 MED ORDER — TRIAMCINOLONE ACETONIDE 0.1 % EX CREA: 1.0000 | TOPICAL_CREAM | Freq: Two times a day (BID) | CUTANEOUS | 0 refills | Status: DC

## 2022-05-25 NOTE — Progress Notes (Signed)
Patient ID: Jennifer Bernard, female   DOB: 03-Oct-1926, 88 y.o.   MRN: [id number]   Subjective:    Patient ID: Jennifer Bernard, female    DOB: [birth year], 88 y.o.   MRN: [id number]   Patient here for  Chief Complaint  Patient presents with   Follow-up    Tired frequent urination    .   HPI Work in appt.  Work in with concerns regarding increased fatigue and nocturia. Has been seeing urology.  Prescribed sanctura XL 27m q hs. Last visit 05/04/22.  Sanctura discontinued due to fatigue.  Not using cpap.  Discussed her fatigue.  Discussed sleep and nocturia.  Discussed reevaluation of sleep apnea and need for treatment.  No chest pain.  Breathing stable.  No increased cough or congestion.  Is concerned regarding dry eyes.  Request referral to ophthalmology.  Discussed her depression screening.  She denies any significant depression.  Denies any suicidal ideations.    Past Medical History:  Diagnosis Date   Asymptomatic menopausal state 02/20/1984   Overview:  Naturally occuring and asymptomatic Vaginal atrophy present on topical estrogen Overview:  Overview:  Naturally occuring and asymptomatic Vaginal atrophy present on topical estrogen   Basal cell carcinoma of skin 02/19/2013   Overview:  Basal cell on her nasolabial fold 2014 S/p Mohs by Dr. CLacinda AxonFollowed by local dermatologist regularly Overview:  Overview:  Followed by Dr. CLacinda Axon  Cyst of ovary 02/19/2009   Overview:  Ovarian cyst on right stable 2004-2010 Cyst ? larger on CT 2012 [place] SEdwinna AreolaOverview:  Overview:  Ovarian cyst on right stable 2004-2010 Cyst ? larger on CT 2012 Stable [place] SEd[place]na Areola  Depression, major, single episode, complete remission ([facility] 07/18[place]   Diffuse cystic mastopathy of breast 02/19/2013   Overview:  Mother with breast cancer later in life Two breast biopsies both with normal pathology Annual mammograms recommended, [place]  Relevant Hx: Course: Daily Update: Today's Plan: Overview:  Overview:  Mother with breast cancer later in life Two breast biopsies both with normal pathology Annual mammograms recommended, last 201   Essential (primary) hypertension 02/19/2013   Overview:  Goal blood pressure < 135/85 On atenolol Overview:  Overview:  Goal blood pressure < 135/85 On atenolol   Family history of breast cancer 05/12/2022   Hyperlipidemia 02/19/2013   Overview:  Goal LDL < 130, last value 92 in 2017 On Pravachol Overview:  Overview:  Goal LDL < 130, last value 82 in 2012 On Pravachol   Hypothyroidism 02/28/2012   Overview:  started on T4  TFT normal 2017 Followed by Dr. JAlmon HerculesOverview:  Overview:  started on T4  TFT normal 2014 Followed by Dr. JAlmon Hercules  Impaired glucose tolerance 02/20/2003   Overview:  Overview:  Glucose goal < 100, last value 94 in 2014 HgbA1c goal < 6.0, last value 5.3 in 2014 Urine microalbumin negative in 2014 On diet and exercise   Nontoxic multinodular goiter 03/06/2003   Overview:  Asymptomatic nodular thyroid Stable [place] Followed by Dr. JAlmon HerculesOverview:  Overview:  Asymptomatic nodular thyroid Stable [place]-2014 Followed by Dr. JAlmon Hercules  OSA on CPAP 01/13/2015   Overview:  Sleep study sent to medical records:   Sleep efficiency 22%, latency 24 min, # awakenings 11, res desat N=10, index 17.8  Apnea 1 for 16 sec,  hypopnea 16 for 18 sec, total sleep time only 90 min so index = 11 She did not sleep much and the index puts her in mild category Repeat study a few days later when she slept 187 min her AHI = 4.6 which is normal, average O2 sat 94% I don't think    Osteoarthritis 02/20/2003   Overview:  Mild bilateral hands Right CMC Right hip Left knee s/p meniscus tear All symptomatically stable Overview:  Overview:  Mild bilateral hands Right CMC Right hip Left knee s/p meniscus tear All symptomatically stable   Osteoporosis 02/20/2003   Overview:   Bone density 2004 LS -3.32 and RH-1.62 Bone density 2008 LS -3.14 and RH -1.22 Bone density 2009 LS -3.17 and RH -1.21 Bone density 2010 LS -3.14 and RH -1.42  Bone density 2014 LS -2.7 (L3 -3.1) and RH -1.5 Bone density 2017 LS -2.3 and RH -1.4 (neck -1.7) Vitamin D level 38 in 2012 On Boniva 2004 to 2010, restarted 2015 Repeat bone density in 2020 and stop Boniva  Last Assessment & Pl   Prediabetes 02/20/2003   Overview:  Glucose goal < 100, last value 91 in 2017 HgbA1c goal < 6.0, last value 5.6 in 2017 Urine microalbumin negative in 2017 On diet and exercise   Recurrent UTI 10/19/2014   Squamous cell carcinoma of skin 03/14/2019   Left mid brow. SCCis, hypertrophic. EDC   Past Surgical History:  Procedure Laterality Date   ABDOMINAL HYSTERECTOMY  1975   MASTECTOMY Left    URETEROSCOPY     URETEROSCOPY WITH HOLMIUM LASER LITHOTRIPSY     Family History  Problem Relation Age of Onset   Breast cancer Mother        dx late 41s   Skin cancer Father        non-melanoma; head   Liver cancer Sister        or other primary: d. 9   Breast cancer Maternal Grandmother        dx late 80s   Skin cancer Daughter        non-melanoma   Social History   Socioeconomic History   Marital status: Widowed    Spouse name: Not on file   Number of children: Not on file   Years of education: Not on file   Highest education level: Not on file  Occupational History   Not on file  Tobacco Use   Smoking status: Never   Smokeless tobacco: Never  Vaping Use   Vaping Use: Never used  Substance and Sexual Activity   Alcohol use: No   Drug use: No   Sexual activity: Not Currently    Birth control/protection: Post-menopausal  Other Topics Concern   Not on file  Social History Narrative   Not on file   Social Determinants of Health   Financial Resource Strain: Low Risk  (03/29/2022)   Overall Financial Resource Strain (CARDIA)    Difficulty of Paying Living Expenses: Not hard at all  Food  Insecurity: No Food Insecurity (03/29/2022)   Hunger Vital Sign    Worried About Running Out of Food in the Last Year: Never true    Ran Out of Food in the Last Year: Never true  Transportation Needs: No Transportation Needs (03/29/2022)   PRAPARE - Hydrologist (Medical): No    Lack of Transportation (Non-Medical): No  Physical Activity: Insufficiently Active (03/29/2022)   Exercise Vital Sign    Days of Exercise per Week:  4 days    Minutes of Exercise per Session: 30 min  Stress: No Stress Concern Present (03/29/2022)   [place]    Feeling of Stress : Not at all  Social Connections: Unknown (03/29/2022)   Social Connection and Isolation Panel [NHANES]    Frequency of Communication with Friends and Family: More than three times a week    Frequency of Social Gatherings with Friends and Family: More than three times a week    Attends Religious Services: Not on file    Active Member of Clubs or Organizations: Not on file    Attends Archivist Meetings: Not on file    Marital Status: Widowed     Review of Systems  Constitutional:  Negative for appetite change and unexpected weight change.  HENT:  Negative for congestion and sinus pressure.   Eyes:        Dry eyes.   Respiratory:  Negative for cough and chest tightness.        Breathing stable.   Cardiovascular:  Negative for chest pain, palpitations and leg swelling.  Gastrointestinal:  Negative for abdominal pain, diarrhea, nausea and vomiting.  Genitourinary:  Negative for difficulty urinating and dysuria.  Musculoskeletal:  Negative for joint swelling and myalgias.  Skin:  Negative for color change and rash.  Neurological:  Negative for dizziness and headaches.  Psychiatric/Behavioral:  Negative for agitation and dysphoric mood.        Objective:     BP 122/72 (BP Location: Right Arm, Patient Position: Sitting, Cuff Size:  Normal)   Pulse 92   Temp 98.5 F (36.9 C) (Oral)   Ht '5\' 3"'  (1.6 m)   Wt 137 lb 9.6 oz (62.4 kg)   SpO2 94%   BMI 24.37 kg/m  Wt Readings from Last 3 Encounters:  05/25/22 137 lb 9.6 oz (62.4 kg)  05/04/22 136 lb (61.7 kg)  04/21/22 136 lb (61.7 kg)    Physical Exam Vitals reviewed.  Constitutional:      General: She is not in acute distress.    Appearance: Normal appearance.  HENT:     Head: Normocephalic and atraumatic.     Right Ear: External ear normal.     Left Ear: External ear normal.  Eyes:     General: No scleral icterus.       Right eye: No discharge.        Left eye: No discharge.     Conjunctiva/sclera: Conjunctivae normal.  Neck:     Thyroid: No thyromegaly.  Cardiovascular:     Rate and Rhythm: Normal rate and regular rhythm.  Pulmonary:     Effort: No respiratory distress.     Breath sounds: Normal breath sounds. No wheezing.  Abdominal:     General: Bowel sounds are normal.     Palpations: Abdomen is soft.     Tenderness: There is no abdominal tenderness.  Musculoskeletal:        General: No swelling or tenderness.     Cervical back: Neck supple. No tenderness.  Lymphadenopathy:     Cervical: No cervical adenopathy.  Skin:    Findings: No erythema or rash.  Neurological:     Mental Status: She is alert.  Psychiatric:        Mood and Affect: Mood normal.        Behavior: Behavior normal.      Outpatient Encounter Medications as of 05/25/2022  Medication Sig   amLODipine (  NORVASC) 5 MG tablet TAKE 1 TABLET BY MOUTH DAILY   escitalopram (LEXAPRO) 10 MG tablet TAKE 1 TABLET BY MOUTH DAILY (FOR MOOD/DEPRESSION)   levothyroxine (SYNTHROID, LEVOTHROID) 75 MCG tablet    mupirocin ointment (BACTROBAN) 2 % Apply to skin/breast qd-bid   pantoprazole (PROTONIX) 40 MG tablet Take 30 minutes before evening meal.   pravastatin (PRAVACHOL) 40 MG tablet TAKE ONE TABLET EACH EVENING   triamcinolone cream (KENALOG) 0.1 % Apply 1 Application topically 2  (two) times daily. Apply to affected area on arm bid x 1 week.   Trospium Chloride 60 MG CP24 Take 1 capsule (60 mg total) by mouth at bedtime.   vitamin B-12 (CYANOCOBALAMIN) 1000 MCG tablet Take 1 tablet (1,000 mcg total) by mouth daily.   [DISCONTINUED] famotidine (PEPCID) 20 MG tablet TAKE 1 TABLET BY MOUTH DAILY   No facility-administered encounter medications on file as of 05/25/2022.     Lab Results  Component Value Date   WBC 6.9 03/22/2022   HGB 15.1 (H) 03/22/2022   HCT 46.0 03/22/2022   PLT 236.0 03/22/2022   GLUCOSE 99 03/22/2022   CHOL 185 03/22/2022   TRIG 122.0 03/22/2022   HDL 79.90 03/22/2022   LDLCALC 80 03/22/2022   ALT 11 03/22/2022   AST 19 03/22/2022   NA 144 03/22/2022   K 4.0 03/22/2022   CL 105 03/22/2022   CREATININE 0.79 03/22/2022   BUN 15 03/22/2022   CO2 28 03/22/2022   TSH 2.32 03/22/2022   HGBA1C 5.7 03/22/2022    MR BRAIN WO CONTRAST  Result Date: 02/16/2022 CLINICAL DATA:  Resting tremor in the left hand. Family history of Lewy body dementia. EXAM: MRI HEAD WITHOUT CONTRAST TECHNIQUE: Multiplanar, multiecho pulse sequences of the brain and surrounding structures were obtained without intravenous contrast. COMPARISON:  Head MRI 11/25/2019 FINDINGS: Brain: There is no evidence of an acute infarct, intracranial hemorrhage, mass, midline shift, or extra-axial fluid collection. Confluent T2 hyperintensities in the cerebral white matter and patchy T2 hyperintensities in the pons are unchanged and nonspecific but compatible with extensive chronic small vessel ischemic disease. Moderate generalized cerebral atrophy is unchanged. Vascular: Major intracranial vascular flow voids are preserved. Skull and upper cervical spine: Unremarkable bone marrow signal. Sinuses/Orbits: Bilateral cataract extraction. Small mucous retention cysts in the left maxillary sinus. Trace right mastoid fluid. Other: None. IMPRESSION: 1. No acute intracranial abnormality. 2. Unchanged  extensive chronic small vessel ischemic disease and moderate cerebral atrophy. Electronically Signed   By: Logan Bores M.D.   On: 02/16/2022 16:59       Assessment & Plan:   Problem List Items Addressed This Visit     Dry eyes    Dry eyes.  Request referral to local ophthalmologist.        Relevant Orders   Ambulatory referral to Ophthalmology   Essential (primary) hypertension    On amlodipine.  Blood pressure as outlined.  No changes made in medication.  Follow pressures.  Follow metabolic panel.       Fatigue    Feel probably multifactorial.  From a cardiac standpoint - stable.  Increased nocturia.  Discussed her untreated sleep apnea and need for reevaluation.  Agreeable.  Refer to pulmonary.        Relevant Orders   Ambulatory referral to Pulmonology   Hyperglycemia    Follow met b and a1c.       Hyperlipidemia    On pravastatin.  Low cholesterol diet and exercise.  Follow lipid panel and  liver function tests.        Hypothyroidism    On thyroid replacement.  Follow tsh.       Nocturia - Primary    Increased nocturia as outlined.  Has seen urology. Trial of sanctura.  Did not feel helped.  Discussed sleep apnea and nocturia.  Discussed the need for further evaluation and treatment of her sleep apnea.  She is in agreement for evaluation.  Refer to pulmonary.        Relevant Orders   Ambulatory referral to Pulmonology   Nontoxic multinodular goiter    Saw endocrinology.  Stable.       OSA (obstructive sleep apnea)    Discussed her fatigue and increased nocturia.  Has known sleep apnea.  Untreated.  Discussed need for reevaluation.  Agreeable.  Refer to pulmonary.        Relevant Orders   Ambulatory referral to Pulmonology   Thoracic aortic ectasia ([place])    Continue blood pressure control - amlodipine.  Continue pravastatin.       Other Visit Diagnoses     Need for immunization against influenza       Relevant Orders   Flu Vaccine QUAD High Dose(Fluad)  (Completed)        Einar Pheasant, MD

## 2022-05-25 NOTE — Patient Instructions (Signed)
Triamcinolone cream - apply to affected area on arm bid x 1 week  Protonix - take 30 minutes before your evening meal

## 2022-05-29 ENCOUNTER — Encounter: Payer: Self-pay | Admitting: Internal Medicine

## 2022-05-29 DIAGNOSIS — H04123 Dry eye syndrome of bilateral lacrimal glands: Secondary | ICD-10-CM | POA: Insufficient documentation

## 2022-05-29 NOTE — Assessment & Plan Note (Signed)
Follow met b and a1c.  

## 2022-05-29 NOTE — Assessment & Plan Note (Signed)
Feel probably multifactorial.  From a cardiac standpoint - stable.  Increased nocturia.  Discussed her untreated sleep apnea and need for reevaluation.  Agreeable.  Refer to pulmonary.

## 2022-05-29 NOTE — Assessment & Plan Note (Signed)
Dry eyes.  Request referral to local ophthalmologist.

## 2022-05-29 NOTE — Assessment & Plan Note (Signed)
Increased nocturia as outlined.  Has seen urology. Trial of sanctura.  Did not feel helped.  Discussed sleep apnea and nocturia.  Discussed the need for further evaluation and treatment of her sleep apnea.  She is in agreement for evaluation.  Refer to pulmonary.

## 2022-05-29 NOTE — Assessment & Plan Note (Signed)
Saw endocrinology.  Stable.

## 2022-05-29 NOTE — Assessment & Plan Note (Signed)
On amlodipine.  Blood pressure as outlined.  No changes made in medication.  Follow pressures.  Follow metabolic panel.  

## 2022-05-29 NOTE — Assessment & Plan Note (Signed)
Discussed her fatigue and increased nocturia.  Has known sleep apnea.  Untreated.  Discussed need for reevaluation.  Agreeable.  Refer to pulmonary.

## 2022-05-29 NOTE — Assessment & Plan Note (Signed)
On pravastatin.  Low cholesterol diet and exercise.  Follow lipid panel and liver function tests.   

## 2022-05-29 NOTE — Assessment & Plan Note (Signed)
On thyroid replacement.  Follow tsh.  

## 2022-05-29 NOTE — Assessment & Plan Note (Signed)
Continue blood pressure control - amlodipine.  Continue pravastatin.  

## 2022-06-02 ENCOUNTER — Telehealth: Payer: Self-pay

## 2022-06-02 NOTE — Telephone Encounter (Signed)
Patient states she was taking Trospium Chloride 60 MG CP24 for getting up to go to the bathroom frequently during the night.  Patient states her urologist took her off of the Trospium Chloride 60 MG CP24 and she would like to see if Dr. Einar Pheasant thinks she needs to be on something for this issue.  Patient states she usually gets up once or twice during the night to go to the bathroom.  Patient has scheduled an appointment to see Dr. Nicki Reaper on 06/09/2022.

## 2022-06-03 NOTE — Telephone Encounter (Signed)
LMTCB

## 2022-06-03 NOTE — Telephone Encounter (Signed)
I just saw her on 05/25/22 for this issue.  We discussed her visit with Dr Erlene Quan and why she stopped the trospium.  We also discussed my concern that sleep apnea was contributing to her nocturia.  Referral has been placed for further evaluation for this.  I do not mind seeing her again, but this is what we discussed at her 05/25/22 appt

## 2022-06-07 ENCOUNTER — Ambulatory Visit: Payer: Medicare HMO | Admitting: Urology

## 2022-06-09 ENCOUNTER — Ambulatory Visit (INDEPENDENT_AMBULATORY_CARE_PROVIDER_SITE_OTHER): Payer: Medicare HMO | Admitting: Internal Medicine

## 2022-06-09 ENCOUNTER — Encounter: Payer: Self-pay | Admitting: Internal Medicine

## 2022-06-09 VITALS — BP 122/64 | HR 74 | Temp 98.4°F | Ht 63.0 in | Wt 136.6 lb

## 2022-06-09 DIAGNOSIS — R739 Hyperglycemia, unspecified: Secondary | ICD-10-CM | POA: Diagnosis not present

## 2022-06-09 DIAGNOSIS — R5383 Other fatigue: Secondary | ICD-10-CM

## 2022-06-09 DIAGNOSIS — R531 Weakness: Secondary | ICD-10-CM

## 2022-06-09 DIAGNOSIS — F325 Major depressive disorder, single episode, in full remission: Secondary | ICD-10-CM | POA: Diagnosis not present

## 2022-06-09 DIAGNOSIS — I10 Essential (primary) hypertension: Secondary | ICD-10-CM | POA: Diagnosis not present

## 2022-06-09 DIAGNOSIS — E039 Hypothyroidism, unspecified: Secondary | ICD-10-CM

## 2022-06-09 DIAGNOSIS — R351 Nocturia: Secondary | ICD-10-CM

## 2022-06-09 DIAGNOSIS — E785 Hyperlipidemia, unspecified: Secondary | ICD-10-CM

## 2022-06-09 NOTE — Progress Notes (Signed)
Patient ID: Jennifer Bernard, female   DOB: May 03, 1927, 88 y.o.   MRN: [id number]   Subjective:    Patient ID: Jennifer Bernard, female    DOB: 12-25-1926, 88 y.o.   MRN: [id number]   Patient here for  Chief Complaint  Patient presents with   Medication Problem    Pt just would like to discuss medications and if she is taking correctly   .   HPI Work in appt. She is accompanied by her daughter.  History obtained from both of them.  Previously had discussed not sleeping well.  Increased nocturia.  Has been referred to pulmonary for evaluation and further treatment - sleep apnea.  No chest pain.  Breathing stable.  Does report increased weakness. Some unsteady gait.  Discussed PT.  Agreeable.  Eating.  No abdominal pain or bowel change reported.     Past Medical History:  Diagnosis Date   Asymptomatic menopausal state 02/20/1984   Overview:  Naturally occuring and asymptomatic Vaginal atrophy present on topical estrogen Overview:  Overview:  Naturally occuring and asymptomatic Vaginal atrophy present on topical estrogen   Basal cell carcinoma of skin 02/19/2013   Overview:  Basal cell on her nasolabial fold 2014 S/p Mohs by Dr. Lacinda Axon Followed by local dermatologist regularly Overview:  Overview:  Followed by Dr. Lacinda Axon   Cyst of ovary 02/19/2009   Overview:  Ovarian cyst on right stable 2004-2010 Cyst ? larger on CT 2012 Stable on follow up US Followed by Dr. Edwinna Areola Overview:  Overview:  Ovarian cyst on right stable 2004-2010 Cyst ? larger on CT 2012 Stable on follow up US Followed by Dr. Edwinna Areola   Depression, major, single episode, complete remission ([place]) 07/18/2017   Diffuse cystic mastopathy of breast 02/19/2013   Overview:  Mother with breast cancer later in life Two breast biopsies both with normal pathology Annual mammograms recommended, last 2016 Followed at the [facility]:  Relevant Hx: Course: Daily Update: Today's Plan: Overview:  Overview:   Mother with breast cancer later in life Two breast biopsies both with normal pathology Annual mammograms recommended, last 201   Essential (primary) hypertension 02/19/2013   Overview:  Goal blood pressure < 135/85 On atenolol Overview:  Overview:  Goal blood pressure < 135/85 On atenolol   Family history of breast cancer 05/12/2022   Hyperlipidemia 02/19/2013   Overview:  Goal LDL < 130, last value 92 in 2017 On Pravachol Overview:  Overview:  Goal LDL < 130, last value 82 in 2012 On Pravachol   Hypothyroidism 02/28/2012   Overview:  started on T4  TFT normal 2017 Followed by Dr. Almon Hercules Overview:  Overview:  started on T4  TFT normal 2014 Followed by Dr. Almon Hercules   Impaired glucose tolerance 02/20/2003   Overview:  Overview:  Glucose goal < 100, last value 94 in 2014 HgbA1c goal < 6.0, last value 5.3 in 2014 Urine microalbumin negative in 2014 On diet and exercise   Nontoxic multinodular goiter 03/06/2003   Overview:  Asymptomatic nodular thyroid Stable [place] 2004-2014 Followed by Dr. Almon Hercules Overview:  Overview:  Asymptomatic nodular thyroid Stable [place] 2004-2014 Followed by Dr. Almon Hercules   OSA on CPAP 01/13/2015   Overview:  Sleep study sent to medical records:   Sleep efficiency 22%, latency 24 min, # awakenings 11, res desat N=10, index 17.8  Apnea 1 for 16 sec, hypopnea 16 for 18 sec, total sleep time only 90 min so index = 11  She did not sleep much and the index puts her in mild category Repeat study a few days later when she slept 187 min her AHI = 4.6 which is normal, average O2 sat 94% I don't think    Osteoarthritis 02/20/2003   Overview:  Mild bilateral hands Right CMC Right hip Left knee s/p meniscus tear All symptomatically stable Overview:  Overview:  Mild bilateral hands Right CMC Right hip Left knee s/p meniscus tear All symptomatically stable   Osteoporosis 02/20/2003   Overview:  Bone density 2004 LS -3.32 and RH-1.62 Bone density 2008 LS -3.14 and RH -1.22 Bone density 2009 LS -3.17 and RH -1.21  Bone density 2010 LS -3.14 and RH -1.42  Bone density 2014 LS -2.7 (L3 -3.1) and RH -1.5 Bone density 2017 LS -2.3 and RH -1.4 (neck -1.7) Vitamin D level 38 in 2012 On Boniva 2004 to 2010, restarted 2015 Repeat bone density in 2020 and stop Boniva  Last Assessment & Pl   Prediabetes 02/20/2003   Overview:  Glucose goal < 100, last value 91 in 2017 HgbA1c goal < 6.0, last value 5.6 in 2017 Urine microalbumin negative in 2017 On diet and exercise   Recurrent UTI 10/19/2014   Squamous cell carcinoma of skin 03/14/2019   Left mid brow. SCCis, hypertrophic. EDC   Past Surgical History:  Procedure Laterality Date   ABDOMINAL HYSTERECTOMY  1975   MASTECTOMY Left    URETEROSCOPY     URETEROSCOPY WITH HOLMIUM LASER LITHOTRIPSY     Family History  Problem Relation Age of Onset   Breast cancer Mother        dx late 36s   Skin cancer Father        non-melanoma; head   Liver cancer Sister        or other primary: d. 80   Breast cancer Maternal Grandmother        dx late 80s   Skin cancer Daughter        non-melanoma   Social History   Socioeconomic History   Marital status: Widowed    Spouse name: Not on file   Number of children: Not on file   Years of education: Not on file   Highest education level: Not on file  Occupational History   Not on file  Tobacco Use   Smoking status: Never   Smokeless tobacco: Never  Vaping Use   Vaping Use: Never used  Substance and Sexual Activity   Alcohol use: No   Drug use: No   Sexual activity: Not Currently    Birth control/protection: Post-menopausal  Other Topics Concern   Not on file  Social History Narrative   Not on file   Social Determinants of Health   Financial Resource Strain: Low Risk  (03/29/2022)   Overall Financial Resource Strain (CARDIA)    Difficulty of Paying Living Expenses: Not hard at all  Food Insecurity: No Food Insecurity (03/29/2022)   Hunger Vital Sign    Worried About Running Out of Food in the Last Year: Never  true    Ran Out of Food in the Last Year: Never true  Transportation Needs: No Transportation Needs (03/29/2022)   PRAPARE - Hydrologist (Medical): No    Lack of Transportation (Non-Medical): No  Physical Activity: Insufficiently Active (03/29/2022)   Exercise Vital Sign    Days of Exercise per Week: 4 days    Minutes of Exercise per Session: 30 min  Stress: No  Stress Concern Present (03/29/2022)   [place]    Feeling of Stress : Not at all  Social Connections: Unknown (03/29/2022)   Social Connection and Isolation Panel [NHANES]    Frequency of Communication with Friends and Family: More than three times a week    Frequency of Social Gatherings with Friends and Family: More than three times a week    Attends Religious Services: Not on file    Active Member of Clubs or Organizations: Not on file    Attends Archivist Meetings: Not on file    Marital Status: Widowed     Review of Systems  Constitutional:  Negative for appetite change and unexpected weight change.  HENT:  Negative for congestion and sinus pressure.   Respiratory:  Negative for cough and chest tightness.        Breathing stable.   Cardiovascular:  Negative for chest pain, palpitations and leg swelling.  Gastrointestinal:  Negative for abdominal pain, diarrhea, nausea and vomiting.  Genitourinary:  Negative for difficulty urinating and dysuria.  Musculoskeletal:  Negative for joint swelling and myalgias.       Weakness as outlined.   Skin:  Negative for color change and rash.  Neurological:  Negative for dizziness and headaches.  Psychiatric/Behavioral:  Negative for agitation and dysphoric mood.        Objective:     BP 122/64 (BP Location: Right Arm, Patient Position: Sitting, Cuff Size: Normal)   Pulse 74   Temp 98.4 F (36.9 C) (Oral)   Ht _0  (1.6 m)   Wt 136 lb 9.6 oz (62 kg)   SpO2 97%   BMI  24.20 kg/m  Wt Readings from Last 3 Encounters:  06/09/22 136 lb 9.6 oz (62 kg)  05/25/22 137 lb 9.6 oz (62.4 kg)  05/04/22 136 lb (61.7 kg)    Physical Exam Vitals reviewed.  Constitutional:      General: She is not in acute distress.    Appearance: Normal appearance.  HENT:     Head: Normocephalic and atraumatic.     Right Ear: External ear normal.     Left Ear: External ear normal.  Eyes:     General: No scleral icterus.       Right eye: No discharge.        Left eye: No discharge.     Conjunctiva/sclera: Conjunctivae normal.  Neck:     Thyroid: No thyromegaly.  Cardiovascular:     Rate and Rhythm: Normal rate and regular rhythm.  Pulmonary:     Effort: No respiratory distress.     Breath sounds: Normal breath sounds. No wheezing.  Abdominal:     General: Bowel sounds are normal.     Palpations: Abdomen is soft.     Tenderness: There is no abdominal tenderness.  Musculoskeletal:        General: No swelling or tenderness.     Cervical back: Neck supple. No tenderness.  Lymphadenopathy:     Cervical: No cervical adenopathy.  Skin:    Findings: No erythema or rash.  Neurological:     Mental Status: She is alert.  Psychiatric:        Mood and Affect: Mood normal.        Behavior: Behavior normal.      Outpatient Encounter Medications as of 06/09/2022  Medication Sig   amLODipine (NORVASC) 5 MG tablet TAKE 1 TABLET BY MOUTH DAILY   escitalopram (LEXAPRO) 10  MG tablet TAKE 1 TABLET BY MOUTH DAILY (FOR MOOD/DEPRESSION)   levothyroxine (SYNTHROID, LEVOTHROID) 75 MCG tablet    pantoprazole (PROTONIX) 40 MG tablet Take 30 minutes before evening meal.   pravastatin (PRAVACHOL) 40 MG tablet TAKE ONE TABLET EACH EVENING   triamcinolone cream (KENALOG) 0.1 % Apply 1 Application topically 2 (two) times daily. Apply to affected area on arm bid x 1 week.   vitamin B-12 (CYANOCOBALAMIN) 1000 MCG tablet Take 1 tablet (1,000 mcg total) by mouth daily.   [DISCONTINUED]  mupirocin ointment (BACTROBAN) 2 % Apply to skin/breast qd-bid   [DISCONTINUED] Trospium Chloride 60 MG CP24 Take 1 capsule (60 mg total) by mouth at bedtime.   No facility-administered encounter medications on file as of 06/09/2022.     Lab Results  Component Value Date   WBC 6.9 03/22/2022   HGB 15.1 (H) 03/22/2022   HCT 46.0 03/22/2022   PLT 236.0 03/22/2022   GLUCOSE 99 03/22/2022   CHOL 185 03/22/2022   TRIG 122.0 03/22/2022   HDL 79.90 03/22/2022   LDLCALC 80 03/22/2022   ALT 11 03/22/2022   AST 19 03/22/2022   NA 144 03/22/2022   K 4.0 03/22/2022   CL 105 03/22/2022   CREATININE 0.79 03/22/2022   BUN 15 03/22/2022   CO2 28 03/22/2022   TSH 2.32 03/22/2022   HGBA1C 5.7 03/22/2022    MR BRAIN WO CONTRAST  Result Date: 02/16/2022 CLINICAL DATA:  Resting tremor in the left hand. Family history of Lewy body dementia. EXAM: MRI HEAD WITHOUT CONTRAST TECHNIQUE: Multiplanar, multiecho pulse sequences of the brain and surrounding structures were obtained without intravenous contrast. COMPARISON:  Head MRI 11/25/2019 FINDINGS: Brain: There is no evidence of an acute infarct, intracranial hemorrhage, mass, midline shift, or extra-axial fluid collection. Confluent T2 hyperintensities in the cerebral white matter and patchy T2 hyperintensities in the pons are unchanged and nonspecific but compatible with extensive chronic small vessel ischemic disease. Moderate generalized cerebral atrophy is unchanged. Vascular: Major intracranial vascular flow voids are preserved. Skull and upper cervical spine: Unremarkable bone marrow signal. Sinuses/Orbits: Bilateral cataract extraction. Small mucous retention cysts in the left maxillary sinus. Trace right mastoid fluid. Other: None. IMPRESSION: 1. No acute intracranial abnormality. 2. Unchanged extensive chronic small vessel ischemic disease and moderate cerebral atrophy. Electronically Signed   By: Logan Bores M.D.   On: 02/16/2022 16:59        Assessment & Plan:   Problem List Items Addressed This Visit     Depression, major, single episode, complete remission ([place])    On lexapro. Discussed with her today.  She declines SI.  Has not desire to harm herself.  Overall feels things are stable.  Has good supportive family.       Essential (primary) hypertension    On amlodipine.  Blood pressure as outlined.  No changes made in medication.  Follow pressures.  Follow metabolic panel.       Fatigue    Feel probably multifactorial.  From a cardiac standpoint - stable.  Increased nocturia.  Discussed her untreated sleep apnea and need for reevaluation.  Has been referred to pulmonary.        Hyperglycemia    Follow met b and a1c.       Hyperlipidemia    On pravastatin.  Low cholesterol diet and exercise.  Follow lipid panel and liver function tests.        Hypothyroidism    On thyroid replacement.  Follow tsh.  Nocturia - Primary    Increased nocturia as outlined.  Has seen urology. Trial of sanctura.  Did not feel helped.  Discussed sleep apnea and nocturia.  Have discussed and again discussed today with her regarding the need for further evaluation and treatment of her sleep apnea.  Has been referred to pulmonary.        Weakness    Some weakness (leg) and fatigue.  Some unsteady gait felt to be related.  Plan home health PT.  Follow.       Relevant Orders   Ambulatory referral to Home Health     Einar Pheasant, MD

## 2022-06-09 NOTE — Telephone Encounter (Signed)
Lvm for pt to return call.

## 2022-06-14 ENCOUNTER — Ambulatory Visit: Payer: Self-pay | Admitting: Genetic Counselor

## 2022-06-14 ENCOUNTER — Encounter: Payer: Self-pay | Admitting: Genetic Counselor

## 2022-06-14 ENCOUNTER — Telehealth: Payer: Self-pay | Admitting: Genetic Counselor

## 2022-06-14 DIAGNOSIS — Z1379 Encounter for other screening for genetic and chromosomal anomalies: Secondary | ICD-10-CM | POA: Insufficient documentation

## 2022-06-14 DIAGNOSIS — Z803 Family history of malignant neoplasm of breast: Secondary | ICD-10-CM

## 2022-06-14 DIAGNOSIS — Z853 Personal history of malignant neoplasm of breast: Secondary | ICD-10-CM

## 2022-06-14 NOTE — Telephone Encounter (Signed)
Revealed negative genetics to daughter, Eustaquio Maize.

## 2022-06-14 NOTE — Progress Notes (Signed)
HPI:   Jennifer Bernard was previously seen in the [place] clinic due to a personal and family history of breast cancer and concerns regarding a hereditary predisposition to cancer. Please refer to our prior cancer genetics clinic note for more information regarding our discussion, assessment and recommendations, at the time. Jennifer Bernard recent genetic test results were disclosed to her daughter, Jennifer Bernard, as were recommendations warranted by these results. These results and recommendations are discussed in more detail below.  CANCER HISTORY:  At the age of 88, [name] was diagnosed with breast cancer s/p mastectomy. She also has a history of non-melanoma skin cancer on her face diagnosed in her 1s or 27s.    FAMILY HISTORY:  We obtained a detailed, 4-generation family history.  Significant diagnoses are listed below: Family History  Problem Relation Age of Onset   Breast cancer Mother        dx late 31s   Skin cancer Father        non-melanoma; head   Liver cancer Sister        or other primary: d. 26   Breast cancer Maternal Grandmother        dx late 80s   Skin cancer Daughter        non-melanoma             Jennifer Bernard is unaware of previous family history of genetic testing for hereditary cancer risks. There is no reported Ashkenazi Jewish ancestry. There is no known consanguinity.    GENETIC TEST RESULTS:  The [facility] Common Hereditary Cancers +RNA Panel found no pathogenic mutations.    The Common Hereditary Cancers + RNA Panel offered by [facility] includes sequencing, deletion/duplication, and RNA testing of the following 47 genes: APC, ATM, AXIN2, BARD1, BMPR1A, BRCA1, BRCA2, BRIP1, CDH1, CDK4*, CDKN2A (p14ARF)*, CDKN2A (p16INK4a)*, CHEK2, CTNNA1, DICER1, EPCAM (Deletion/duplication testing only), GREM1 (promoter region deletion/duplication testing only), KIT, MEN1, MLH1, MSH2, MSH3, MSH6, MUTYH, NBN, NF1, NHTL1, PALB2, PDGFRA*, PMS2, POLD1, POLE, PTEN, RAD50,  RAD51C, RAD51D, SDHB, SDHC, SDHD, SMAD4, SMARCA4. STK11, TP53, TSC1, TSC2, and VHL.  The following genes were evaluated for sequence changes only: SDHA and HOXB13 c.251G>A variant only.  RNA analysis is not performed for the * genes.    The test report has been scanned into EPIC and is located under the Molecular Pathology section of the Results Review tab.  A portion of the result report is included below for reference. Genetic testing reported out on June 09, 2022.       Even though a pathogenic variant was not identified, possible explanations for the cancer in the family may include: There may be no hereditary risk for cancer in the family. The cancers in Jennifer Bernard and/or her family may be sporadic/familial or due to other genetic and environmental factors. There may be a gene mutation in one of these genes that current testing methods cannot detect but that chance is small. There could be another gene that has not yet been discovered, or that we have not yet tested, that is responsible for the cancer diagnoses in the family.   Therefore, it is important to remain in touch with cancer genetics in the future so that we can continue to offer Jennifer Bernard the most up to date genetic testing.    ADDITIONAL GENETIC TESTING:  We discussed with Ms. Ewan that her genetic testing was fairly extensive.  If there are additional relevant genes identified to increase cancer risk that can be  analyzed in the future, we would be happy to discuss and coordinate this testing at that time.     CANCER SCREENING RECOMMENDATIONS:  Jennifer Bernard test result is considered negative (normal).  This means that we have not identified a hereditary cause for her personal history of breast cancer at this time.   An individual's cancer risk and medical management are not determined by genetic test results alone. Overall cancer risk assessment incorporates additional factors, including personal medical history, family  history, and any available genetic information that may result in a personalized plan for cancer prevention and surveillance. Therefore, it is recommended she continue to follow the cancer management and screening guidelines provided by her oncology and primary healthcare provider.  RECOMMENDATIONS FOR FAMILY MEMBERS:   Since she did not inherit a identifiable mutation in a cancer predisposition gene included on this panel, her children could not have inherited a known mutation from her in one of these genes. Individuals in this family might be at some increased risk of developing cancer, over the general population risk, due to the family history of cancer.  Individuals in the family should notify their providers of the family history of cancer. We recommend women in this family have a yearly mammogram beginning at age 55, or 50 years younger than the earliest onset of cancer, an annual clinical breast exam, and perform monthly breast self-exams.   FOLLOW-UP:  Lastly, we discussed with Jennifer Bernard that cancer genetics is a rapidly advancing field and it is possible that new genetic tests will be appropriate for her and/or her family members in the future. We encouraged her to remain in contact with cancer genetics on an annual basis so we can update her personal and family histories and let her know of advances in cancer genetics that may benefit this family.   Our contact number was provided. [name] questions were answered to her satisfaction, and she knows she is welcome to call us at anytime with additional questions or concerns.   Jessi Pitstick M. Joette Catching, [place], [facility] Genetic Counselor [email or web address] (P) [phone]

## 2022-06-19 ENCOUNTER — Encounter: Payer: Self-pay | Admitting: Internal Medicine

## 2022-06-19 DIAGNOSIS — R531 Weakness: Secondary | ICD-10-CM | POA: Insufficient documentation

## 2022-06-19 NOTE — Assessment & Plan Note (Signed)
On amlodipine.  Blood pressure as outlined.  No changes made in medication.  Follow pressures.  Follow metabolic panel.

## 2022-06-19 NOTE — Assessment & Plan Note (Signed)
Some weakness (leg) and fatigue.  Some unsteady gait felt to be related.  Plan home health PT.  Follow.

## 2022-06-19 NOTE — Assessment & Plan Note (Signed)
Follow met b and a1c.  

## 2022-06-19 NOTE — Assessment & Plan Note (Signed)
On lexapro. Discussed with her today.  She declines SI.  Has not desire to harm herself.  Overall feels things are stable.  Has good supportive family.

## 2022-06-19 NOTE — Assessment & Plan Note (Signed)
Increased nocturia as outlined.  Has seen urology. Trial of sanctura.  Did not feel helped.  Discussed sleep apnea and nocturia.  Have discussed and again discussed today with her regarding the need for further evaluation and treatment of her sleep apnea.  Has been referred to pulmonary.

## 2022-06-19 NOTE — Assessment & Plan Note (Signed)
Feel probably multifactorial.  From a cardiac standpoint - stable.  Increased nocturia.  Discussed her untreated sleep apnea and need for reevaluation.  Has been referred to pulmonary.

## 2022-06-19 NOTE — Assessment & Plan Note (Signed)
On thyroid replacement.  Follow tsh.  

## 2022-06-19 NOTE — Assessment & Plan Note (Signed)
On pravastatin.  Low cholesterol diet and exercise.  Follow lipid panel and liver function tests.   

## 2022-06-20 NOTE — Telephone Encounter (Addendum)
Called pt x2 lvm x2 for pt to return call.

## 2022-06-21 ENCOUNTER — Telehealth: Payer: Self-pay

## 2022-06-21 NOTE — Telephone Encounter (Signed)
Soni with [facility] PT called to state they did not admit the pt as she is independent he stated there was no need.

## 2022-06-30 ENCOUNTER — Telehealth: Payer: Self-pay | Admitting: Internal Medicine

## 2022-06-30 NOTE — Telephone Encounter (Signed)
Attempted to speak to pt - pt picked up and hung up , called back and phone was busy.

## 2022-06-30 NOTE — Telephone Encounter (Signed)
Pt would like to be called in regards to her medication. Pt is going out of town and would like an update or her medication before she reorder

## 2022-07-04 NOTE — Telephone Encounter (Signed)
LM for pt to cb - 2nd attempt

## 2022-07-20 ENCOUNTER — Other Ambulatory Visit (INDEPENDENT_AMBULATORY_CARE_PROVIDER_SITE_OTHER): Payer: Medicare HMO

## 2022-07-20 DIAGNOSIS — I10 Essential (primary) hypertension: Secondary | ICD-10-CM | POA: Diagnosis not present

## 2022-07-20 DIAGNOSIS — R739 Hyperglycemia, unspecified: Secondary | ICD-10-CM | POA: Diagnosis not present

## 2022-07-20 DIAGNOSIS — E785 Hyperlipidemia, unspecified: Secondary | ICD-10-CM | POA: Diagnosis not present

## 2022-07-20 LAB — LIPID PANEL
Cholesterol: 163 mg/dL (ref 0–200)
HDL: 70 mg/dL (ref >39.00)
LDL Cholesterol: 67 mg/dL (ref 0–99)
NonHDL: 92.87
Total CHOL/HDL Ratio: 2
Triglycerides: 130 mg/dL (ref 0.0–149.0)
VLDL: 26 mg/dL (ref 0.0–40.0)

## 2022-07-20 LAB — BASIC METABOLIC PANEL
BUN: 12 mg/dL (ref 6–23)
CO2: 29 mEq/L (ref 19–32)
Calcium: 9 mg/dL (ref 8.4–10.5)
Chloride: 105 mEq/L (ref 96–112)
Creatinine, Ser: 0.89 mg/dL (ref 0.40–1.20)
GFR: 55.93 mL/min — ABNORMAL LOW (ref >60.00)
Glucose, Bld: 98 mg/dL (ref 70–99)
Potassium: 4.1 mEq/L (ref 3.5–5.1)
Sodium: 142 mEq/L (ref 135–145)

## 2022-07-20 LAB — HEPATIC FUNCTION PANEL
ALT: 10 U/L (ref 0–35)
AST: 16 U/L (ref 0–37)
Albumin: 3.8 g/dL (ref 3.5–5.2)
Alkaline Phosphatase: 61 U/L (ref 39–117)
Bilirubin, Direct: 0.2 mg/dL (ref 0.0–0.3)
Total Bilirubin: 1 mg/dL (ref 0.2–1.2)
Total Protein: 6.1 g/dL (ref 6.0–8.3)

## 2022-07-20 LAB — HEMOGLOBIN A1C: Hgb A1c MFr Bld: 5.7 % (ref 4.6–6.5)

## 2022-07-21 ENCOUNTER — Ambulatory Visit: Payer: Medicare HMO | Admitting: Urology

## 2022-07-25 ENCOUNTER — Telehealth: Payer: Self-pay | Admitting: Internal Medicine

## 2022-07-25 ENCOUNTER — Ambulatory Visit: Payer: Medicare HMO | Admitting: Internal Medicine

## 2022-07-25 ENCOUNTER — Emergency Department: Payer: Medicare HMO

## 2022-07-25 ENCOUNTER — Encounter: Payer: Self-pay | Admitting: Emergency Medicine

## 2022-07-25 ENCOUNTER — Telehealth: Payer: Self-pay

## 2022-07-25 ENCOUNTER — Emergency Department
Admission: EM | Admit: 2022-07-25 | Discharge: 2022-07-25 | Disposition: A | Discharge status: Home / Self Care | Payer: Medicare HMO | Attending: Emergency Medicine | Admitting: Emergency Medicine

## 2022-07-25 ENCOUNTER — Other Ambulatory Visit: Payer: Self-pay

## 2022-07-25 DIAGNOSIS — G459 Transient cerebral ischemic attack, unspecified: Secondary | ICD-10-CM | POA: Diagnosis not present

## 2022-07-25 DIAGNOSIS — I10 Essential (primary) hypertension: Secondary | ICD-10-CM | POA: Insufficient documentation

## 2022-07-25 DIAGNOSIS — E039 Hypothyroidism, unspecified: Secondary | ICD-10-CM | POA: Insufficient documentation

## 2022-07-25 DIAGNOSIS — R200 Anesthesia of skin: Secondary | ICD-10-CM | POA: Diagnosis not present

## 2022-07-25 DIAGNOSIS — R202 Paresthesia of skin: Secondary | ICD-10-CM | POA: Insufficient documentation

## 2022-07-25 LAB — COMPREHENSIVE METABOLIC PANEL
ALT: 13 U/L (ref 0–44)
AST: 23 U/L (ref 15–41)
Albumin: 3.8 g/dL (ref 3.5–5.0)
Alkaline Phosphatase: 60 U/L (ref 38–126)
Anion gap: 7 (ref 5–15)
BUN: 13 mg/dL (ref 8–23)
CO2: 24 mmol/L (ref 22–32)
Calcium: 9.1 mg/dL (ref 8.9–10.3)
Chloride: 112 mmol/L — ABNORMAL HIGH (ref 98–111)
Creatinine, Ser: 0.69 mg/dL (ref 0.44–1.00)
GFR, Estimated: >60 mL/min (ref >60)
Glucose, Bld: 111 mg/dL — ABNORMAL HIGH (ref 70–99)
Potassium: 3.6 mmol/L (ref 3.5–5.1)
Sodium: 143 mmol/L (ref 135–145)
Total Bilirubin: 0.9 mg/dL (ref 0.3–1.2)
Total Protein: 6.9 g/dL (ref 6.5–8.1)

## 2022-07-25 LAB — CBC
HCT: 46.9 % — ABNORMAL HIGH (ref 36.0–46.0)
Hemoglobin: 15.2 g/dL — ABNORMAL HIGH (ref 12.0–15.0)
MCH: 29.7 pg (ref 26.0–34.0)
MCHC: 32.4 g/dL (ref 30.0–36.0)
MCV: 91.8 fL (ref 80.0–100.0)
Platelets: 262 10*3/uL (ref 150–400)
RBC: 5.11 MIL/uL (ref 3.87–5.11)
RDW: 13.7 % (ref 11.5–15.5)
WBC: 6.7 10*3/uL (ref 4.0–10.5)
nRBC: 0 % (ref 0.0–0.2)

## 2022-07-25 NOTE — ED Provider Notes (Addendum)
[facility] Provider Note    Event Date/Time   First MD Initiated Contact with Patient 07/25/22 1106     (approximate)   History   Chief Complaint Numbness   HPI  Jennifer Bernard is a 88 y.o. female with past medical history of hypertension, hyperlipidemia, GERD, and hypothyroidism who presents to the ED complaining of numbness.  Patient reports that she began to notice numbness over her left arm and face around 7 PM last night.  Symptoms seem to gradually resolve by the time she went to bed around 9 PM, remained resolved when she woke up this morning.  She denies any associated vision changes, speech changes, facial droop, or extremity weakness.  When she mentioned the symptoms to her family earlier today, they decided to have her come to the ED to be "checked out."  Patient states she has felt well with no fevers, cough, chest pain, shortness of breath, nausea, vomiting, diarrhea, or dysuria.     Physical Exam   Triage Vital Signs: ED Triage Vitals [07/25/22 0932]  Enc Vitals Group     BP 130/82     Pulse Rate 77     Resp 18     Temp 97.6 F (36.4 C)     Temp Source Oral     SpO2 99 %     Weight 136 lb (61.7 kg)     Height '5\' 3"'$  (1.6 m)     Head Circumference      Peak Flow      Pain Score 0     Pain Loc      Pain Edu?      Excl. in [place]?     Most recent vital signs: Vitals:   07/25/22 0932  BP: 130/82  Pulse: 77  Resp: 18  Temp: 97.6 F (36.4 C)  SpO2: 99%    Constitutional: Alert and oriented. Eyes: Conjunctivae are normal. Head: Atraumatic. Nose: No congestion/rhinnorhea. Mouth/Throat: Mucous membranes are moist.  Cardiovascular: Normal rate, regular rhythm. Grossly normal heart sounds.  2+ radial pulses bilaterally. Respiratory: Normal respiratory effort.  No retractions. Lungs CTAB. Gastrointestinal: Soft and nontender. No distention. Musculoskeletal: No lower extremity tenderness nor edema.  Neurologic:  Normal speech  and language. No gross focal neurologic deficits are appreciated.    ED Results / Procedures / Treatments   Labs (all labs ordered are listed, but only abnormal results are displayed) Labs Reviewed  CBC - Abnormal; Notable for the following components:      Result Value   Hemoglobin 15.2 (*)    HCT 46.9 (*)    All other components within normal limits  COMPREHENSIVE METABOLIC PANEL - Abnormal; Notable for the following components:   Chloride 112 (*)    Glucose, Bld 111 (*)    All other components within normal limits   RADIOLOGY CT head reviewed and interpreted by me with no hemorrhage or midline shift.  PROCEDURES:  Critical Care performed: No  Procedures   MEDICATIONS ORDERED IN ED: Medications - No data to display   IMPRESSION / MDM / [place] / ED COURSE  I reviewed the triage vital signs and the nursing notes.                              88 y.o. female with past medical history of hypertension, hyperlipidemia, GERD, and hypothyroidism who presents to the ED complaining of 2 hours of numbness  in her left face and left arm last night that have since resolved.  Patient's presentation is most consistent with acute presentation with potential threat to life or bodily function.  Differential diagnosis includes, but is not limited to, stroke, TIA, electrolyte abnormality, AKI, anemia.  Patient well-appearing and in no acute distress, vital signs are unremarkable.  She has no focal neurologic deficits on exam, states that numbness has resolved.  CT head is negative for acute process, labs are reassuring with no significant anemia, leukocytosis, electrode abnormality, or AKI.  Low suspicion for TIA or stroke at this time given resolved symptoms that were sensory only, patient with ABCD score of 3 and is therefore low risk.  She was counseled to follow-up with her PCP and to return to the ED for new or worsening symptoms.  Admission considered for further stroke/TIA  workup, but will hold off given overall low concern for stroke or TIA.  Patient and family agree with plan.      FINAL CLINICAL IMPRESSION(S) / ED DIAGNOSES   Final diagnoses:  Left arm numbness  Paresthesia     Rx / DC Orders   ED Discharge Orders     None        Note:  This document was prepared using Dragon voice recognition software and may include unintentional dictation errors.   Blake Divine, MD 07/25/22 7616    Blake Divine, MD 07/25/22 [phone]

## 2022-07-25 NOTE — Telephone Encounter (Signed)
Agree with need for evaluation now given symptoms.

## 2022-07-25 NOTE — Telephone Encounter (Signed)
Lm for patient to ask if she is currently wearing cpap.

## 2022-07-25 NOTE — Telephone Encounter (Signed)
S/w Beth - pt checked out in ER , advised no stroke Recommended f/u w/ PCP within 1 week.  Pt daughter advised will hold for cancellation and work in.   Sent to [facility] for TCM

## 2022-07-25 NOTE — Telephone Encounter (Signed)
See phone note

## 2022-07-25 NOTE — Telephone Encounter (Signed)
Pt daughter called stating pt had a stroke this morning and she was taken to the ER. The doctor there stated that it was not a stroke. Pt daughter would like to be called

## 2022-07-25 NOTE — Telephone Encounter (Signed)
Patient stated last night around 10pm she had numbness on left side of face and her left arm was numb. Today she is not feeling well, no energy. Transferred to Access Nurse.

## 2022-07-25 NOTE — ED Triage Notes (Signed)
Pt states last night she had episode of left sided facial numbness and left arm numbness. States symptoms improved by the time she went to bed. States woke up this am with resolved symptoms, states just wanted to get checked out. No deficits noted at this time, no hx of the same.

## 2022-07-26 ENCOUNTER — Encounter: Payer: Self-pay | Admitting: Internal Medicine

## 2022-07-26 ENCOUNTER — Ambulatory Visit (INDEPENDENT_AMBULATORY_CARE_PROVIDER_SITE_OTHER): Payer: Medicare HMO | Admitting: Internal Medicine

## 2022-07-26 VITALS — BP 120/78 | HR 77 | Temp 98.2°F | Ht 63.0 in | Wt 138.4 lb

## 2022-07-26 DIAGNOSIS — R0602 Shortness of breath: Secondary | ICD-10-CM | POA: Diagnosis not present

## 2022-07-26 DIAGNOSIS — R5382 Chronic fatigue, unspecified: Secondary | ICD-10-CM | POA: Diagnosis not present

## 2022-07-26 NOTE — Progress Notes (Signed)
Name: ZYNASIA BURKLOW MRN: [id number] DOB: [birth year]    SYNOPSIS She was on CPAP for a period of time, stopped using 3 years ago after a physician at [facility] told her she did not need to use CPAP anymore. She has been waking up more frequently at night. Bedtime is 9-10pm. She wakes up 2-3 times to use restroom which interferes with her quaility of her sleep. She gets tired late afternoon, takes a nap for 45-60 mins. She does not currently have any respiratory complaints or shortness of breath. Denies sleep talking/walking, narcolepsy, cataplexy.   Patient was set up for a home sleep study for this was completed on October 29, 2020.  This showed an AHI at 3.8/hour with an SPO2 low at 86%.  Total sleep study time was 679 minutes.  Average O2 saturation was 90%.  We went over her sleep study results and detail.  Patient education given on sleep apnea. Patient says she has nocturia at least 2-3 times a night.  And gets up to go the bathroom.  Feels tired quite a bit.    CHIEF COMPLAINT:  Follow-up fatigue  EXCESSIVE DAYTIME SLEEPINESS   HISTORY OF PRESENT ILLNESS: Patient had extensive workup in the past with sleep study that showed an AHI of 3.8 which is nondiagnostic for underlying sleep apnea Several years ago patient underwent pulmonary function testing and chest x-ray which did not reveal any significant findings  At this time patient is complaining of more fatigue however it is not related to any type of lung disease or sleep disordered breathing  Patient's main complaint is waking up at night 2 times to go to the bathroom I have advised to stop drinking water prior to sleep   No exacerbation at this time No evidence of heart failure at this time No evidence or signs of infection at this time No respiratory distress No fevers, chills, nausea, vomiting, diarrhea No evidence of lower extremity edema No evidence hemoptysis     PAST MEDICAL HISTORY :   has a past medical history  of Asymptomatic menopausal state (02/20/1984), Basal cell carcinoma of skin (02/19/2013), Cyst of ovary (02/19/2009), Depression, major, single episode, complete remission ([place]) (07/18/2017), Diffuse cystic mastopathy of breast (02/19/2013), Essential (primary) hypertension (02/19/2013), Family history of breast cancer (05/12/2022), Hyperlipidemia (02/19/2013), Hypothyroidism (02/28/2012), Impaired glucose tolerance (02/20/2003), Nontoxic multinodular goiter (03/06/2003), OSA on CPAP (01/13/2015), Osteoarthritis (02/20/2003), Osteoporosis (02/20/2003), Prediabetes (02/20/2003), Recurrent UTI (10/19/2014), and Squamous cell carcinoma of skin (03/14/2019).  has a past surgical history that includes Abdominal hysterectomy (1975); Ureteroscopy with holmium laser lithotripsy; Ureteroscopy; and Mastectomy (Left). Prior to Admission medications   Medication Sig Start Date End Date Taking? Authorizing Provider  amLODipine (NORVASC) 5 MG tablet TAKE 1 TABLET BY MOUTH DAILY 01/21/22   Einar Pheasant, MD  escitalopram (LEXAPRO) 10 MG tablet TAKE 1 TABLET BY MOUTH DAILY (FOR MOOD/DEPRESSION) 03/22/22   Dutch Quint B, FNP  levothyroxine (SYNTHROID, LEVOTHROID) 75 MCG tablet  06/12/17   [provider]  pantoprazole (PROTONIX) 40 MG tablet Take 30 minutes before evening meal. 05/25/22   Einar Pheasant, MD  pravastatin (PRAVACHOL) 40 MG tablet TAKE ONE TABLET EACH EVENING 08/31/21   Einar Pheasant, MD  triamcinolone cream (KENALOG) 0.1 % Apply 1 Application topically 2 (two) times daily. Apply to affected area on arm bid x 1 week. 05/25/22   Einar Pheasant, MD  vitamin B-12 (CYANOCOBALAMIN) 1000 MCG tablet Take 1 tablet (1,000 mcg total) by mouth daily. 04/09/21   Einar Pheasant, MD  Allergies  Allergen Reactions   Penicillins Hives    Other reaction(s): Unknown   Ceftin [Cefuroxime Axetil] Diarrhea    GI distress   Ramipril     Other reaction(s): Cough Other reaction(s): Cough     FAMILY HISTORY:  family history includes  Breast cancer in her maternal grandmother and mother; Liver cancer in her sister; Skin cancer in her daughter and father. SOCIAL HISTORY:  reports that she has never smoked. She has never used smokeless tobacco. She reports that she does not drink alcohol and does not use drugs.   Review of Systems:  Gen:  Denies  fever, sweats, chills weight loss  +fatigue HEENT: Denies blurred vision, double vision, ear pain, eye pain, hearing loss, nose bleeds, sore throat Cardiac:  No dizziness, chest pain or heaviness, chest tightness,edema, No JVD Resp:   No cough, -sputum production, -shortness of breath,-wheezing, -hemoptysis,  Gi: Denies swallowing difficulty, stomach pain, nausea or vomiting, diarrhea, constipation, bowel incontinence Gu:  Denies bladder incontinence, burning urine Ext:   Denies Joint pain, stiffness or swelling Skin: Denies  skin rash, easy bruising or bleeding or hives Endoc:  Denies polyuria, polydipsia , polyphagia or weight change Psych:   Denies depression, insomnia or hallucinations  Other:  All other systems negative   ALL OTHER ROS ARE NEGATIVE  BP 120/78 (BP Location: Right Arm, Cuff Size: Normal)   Pulse 77   Temp 98.2 F (36.8 C)   Ht '5\' 3"'$  (1.6 m)   Wt 138 lb 6.4 oz (62.8 kg)   SpO2 96%   BMI 24.52 kg/m     Physical Examination:   General Appearance: No distress  EYES PERRLA, EOM intact.   NECK Supple, No JVD Pulmonary: normal breath sounds, No wheezing.  CardiovascularNormal S1,S2.  No m/r/g.   Abdomen: Benign, Soft, non-tender. Skin:   warm, no rashes, no ecchymosis  Extremities: normal, no cyanosis, clubbing. Neuro:without focal findings,  speech normal  PSYCHIATRIC: Mood, affect within normal limits.   ALL OTHER ROS ARE NEGATIVE    ASSESSMENT AND PLAN SYNOPSIS  Patient with signs and symptoms of excessive daytime sleepiness with fatigue, previous work up for sleep apnea was NEG to OSA, will need to assess nocturnal and exertional  hypoxia  Fatigue/SOB Previous PFT's WNL 2 years ago Previous Imaging did NOT reveal any significant findings 2 years ago Check ONO and 6MWT   Deconditioned state -Recommend increased daily activity and exercise  CURRENT MEDICATIONS REVIEWED AT LENGTH WITH PATIENT TODAY   Patient  satisfied with Plan of action and management. All questions answered  Follow up  6 months  Total Time Spent  35 mins   Maretta Bees Patricia Pesa, M.D.  [facility]  Medical Director [place] Director [facility] Cardio-Pulmonary Department

## 2022-07-26 NOTE — Patient Instructions (Signed)
Check oxygen levels at nighttime with overnight pulse oximetry  Check oxygen levels while walking with 6-minute walk test  Recommend follow-up with urologist  No further testing needed at this time

## 2022-07-26 NOTE — Telephone Encounter (Signed)
Spoke to patient's son, Jim(DPR). Patient is not currently wearing cpap.

## 2022-07-26 NOTE — Telephone Encounter (Signed)
Hold for work in appt

## 2022-08-02 ENCOUNTER — Other Ambulatory Visit: Payer: Self-pay | Admitting: Internal Medicine

## 2022-08-05 ENCOUNTER — Other Ambulatory Visit: Payer: Self-pay | Admitting: Internal Medicine

## 2022-08-18 ENCOUNTER — Ambulatory Visit (INDEPENDENT_AMBULATORY_CARE_PROVIDER_SITE_OTHER): Payer: Medicare HMO

## 2022-08-18 DIAGNOSIS — R5382 Chronic fatigue, unspecified: Secondary | ICD-10-CM

## 2022-08-18 DIAGNOSIS — R0602 Shortness of breath: Secondary | ICD-10-CM | POA: Diagnosis not present

## 2022-08-18 NOTE — Progress Notes (Signed)
Pt in office for six minute walk test

## 2022-08-19 ENCOUNTER — Telehealth: Payer: Self-pay | Admitting: Internal Medicine

## 2022-08-19 NOTE — Telephone Encounter (Signed)
Patient would like to know if she should get the RSV injection.

## 2022-08-22 NOTE — Telephone Encounter (Signed)
Pt son advised recommendation is anyone over the age of 63 or under the age of 57 WITH a lung condition. Pt son gave verbal understanding.

## 2022-08-30 ENCOUNTER — Telehealth: Payer: Self-pay | Admitting: Internal Medicine

## 2022-08-30 DIAGNOSIS — G4734 Idiopathic sleep related nonobstructive alveolar hypoventilation: Secondary | ICD-10-CM

## 2022-08-30 NOTE — Telephone Encounter (Signed)
Patient is aware of results and voiced her understanding. She agrees with treatment plan.  Order placed to [facility]. Nothing further needed.

## 2022-08-30 NOTE — Telephone Encounter (Signed)
ONO reviewed Dr. Jackie Plum 2L QHS.  Lm for patient.

## 2022-09-06 ENCOUNTER — Telehealth: Payer: Self-pay | Admitting: Internal Medicine

## 2022-09-06 NOTE — Telephone Encounter (Signed)
Please see 08/30/2022 phone note.   Spoke to patient's daughter, Beth(DPR) and relayed results. She voiced her understanding.  Nothing further needed.

## 2022-09-06 NOTE — Telephone Encounter (Signed)
Beth daughter is checking on order for oxygen. Beth phone number is [phone].

## 2022-09-06 NOTE — Telephone Encounter (Signed)
Sent urgent message to [facility] asking them to check on this issue

## 2022-09-08 DIAGNOSIS — G4736 Sleep related hypoventilation in conditions classified elsewhere: Secondary | ICD-10-CM | POA: Diagnosis not present

## 2022-09-08 DIAGNOSIS — G4733 Obstructive sleep apnea (adult) (pediatric): Secondary | ICD-10-CM | POA: Diagnosis not present

## 2022-09-11 ENCOUNTER — Other Ambulatory Visit: Payer: Self-pay | Admitting: Internal Medicine

## 2022-09-13 ENCOUNTER — Ambulatory Visit (INDEPENDENT_AMBULATORY_CARE_PROVIDER_SITE_OTHER): Payer: Medicare PPO | Admitting: Internal Medicine

## 2022-09-13 ENCOUNTER — Encounter: Payer: Self-pay | Admitting: Internal Medicine

## 2022-09-13 VITALS — BP 128/78 | HR 86 | Temp 97.9°F | Resp 16 | Ht 63.0 in | Wt 138.0 lb

## 2022-09-13 DIAGNOSIS — I10 Essential (primary) hypertension: Secondary | ICD-10-CM

## 2022-09-13 DIAGNOSIS — Z853 Personal history of malignant neoplasm of breast: Secondary | ICD-10-CM

## 2022-09-13 DIAGNOSIS — R200 Anesthesia of skin: Secondary | ICD-10-CM | POA: Diagnosis not present

## 2022-09-13 DIAGNOSIS — R739 Hyperglycemia, unspecified: Secondary | ICD-10-CM

## 2022-09-13 DIAGNOSIS — E039 Hypothyroidism, unspecified: Secondary | ICD-10-CM | POA: Diagnosis not present

## 2022-09-13 DIAGNOSIS — M810 Age-related osteoporosis without current pathological fracture: Secondary | ICD-10-CM | POA: Diagnosis not present

## 2022-09-13 DIAGNOSIS — R0902 Hypoxemia: Secondary | ICD-10-CM

## 2022-09-13 DIAGNOSIS — E785 Hyperlipidemia, unspecified: Secondary | ICD-10-CM

## 2022-09-13 DIAGNOSIS — F325 Major depressive disorder, single episode, in full remission: Secondary | ICD-10-CM | POA: Diagnosis not present

## 2022-09-13 DIAGNOSIS — I77810 Thoracic aortic ectasia: Secondary | ICD-10-CM

## 2022-09-13 NOTE — Progress Notes (Signed)
Subjective:    Patient ID: Jennifer Bernard, female    DOB: 05/02/1927, 89 y.o.   MRN: [id number]  Patient here for  Chief Complaint  Patient presents with   Medical Management of Chronic Issues    HPI Here to follow up regarding depression, hypertension, hypothyroidism and hypercholesterolemia.  She is accompanied by her daughter.  History obtained from both of them. Reports she is doing relatively well.  Tries to stay active.  No chest pain or sob reported. No cough or congestion.  No acid reflux reported.  No abdominal pain.  ER evaluation 07/25/22 - left arm numbness - CT head - ok. On questioning, she states she will notice some left hand/arm numbness/tingling. Notices when up in am.  Shakes arm and resolves.  No arm or hand weakness.  No other neurological changes noted.  Discussed possible carpal tunnel.  Discussed cock up wrist splint and NCS. Saw Dr Mortimer Fries - recommended 2 liters oxygen q hs. Started wearing just a couple of nights ago. Previous PFTs wnl two years ago. Negative OSA.    Past Medical History:  Diagnosis Date   Asymptomatic menopausal state 02/20/1984   Overview:  Naturally occuring and asymptomatic Vaginal atrophy present on topical estrogen Overview:  Overview:  Naturally occuring and asymptomatic Vaginal atrophy present on topical estrogen   Basal cell carcinoma of skin 02/19/2013   Overview:  Basal cell on her nasolabial fold 2014 S/p Mohs by Dr. Lacinda Axon Followed by local dermatologist regularly Overview:  Overview:  Followed by Dr. Lacinda Axon   Cyst of ovary 02/19/2009   Overview:  Ovarian cyst on right stable 2004-2010 Cyst ? larger on CT 2012 Stable on follow up US Followed by Dr. Edwinna Areola Overview:  Overview:  Ovarian cyst on right stable 2004-2010 Cyst ? larger on CT 2012 Stable on follow up US Followed by Dr. Edwinna Areola   Depression, major, single episode, complete remission ([place]) 07/18/2017   Diffuse cystic mastopathy of breast 02/19/2013   Overview:  Mother with  breast cancer later in life Two breast biopsies both with normal pathology Annual mammograms recommended, last 2016 Followed at the [facility]:  Relevant Hx: Course: Daily Update: Today's Plan: Overview:  Overview:  Mother with breast cancer later in life Two breast biopsies both with normal pathology Annual mammograms recommended, last 201   Essential (primary) hypertension 02/19/2013   Overview:  Goal blood pressure < 135/85 On atenolol Overview:  Overview:  Goal blood pressure < 135/85 On atenolol   Family history of breast cancer 05/12/2022   Hyperlipidemia 02/19/2013   Overview:  Goal LDL < 130, last value 92 in 2017 On Pravachol Overview:  Overview:  Goal LDL < 130, last value 82 in 2012 On Pravachol   Hypothyroidism 02/28/2012   Overview:  started on T4  TFT normal 2017 Followed by Dr. Almon Hercules Overview:  Overview:  started on T4  TFT normal 2014 Followed by Dr. Almon Hercules   Impaired glucose tolerance 02/20/2003   Overview:  Overview:  Glucose goal < 100, last value 94 in 2014 HgbA1c goal < 6.0, last value 5.3 in 2014 Urine microalbumin negative in 2014 On diet and exercise   Nontoxic multinodular goiter 03/06/2003   Overview:  Asymptomatic nodular thyroid Stable [place] 2004-2014 Followed by Dr. Almon Hercules Overview:  Overview:  Asymptomatic nodular thyroid Stable [place] 2004-2014 Followed by Dr. Almon Hercules   OSA on CPAP 01/13/2015   Overview:  Sleep study sent to medical records:  Sleep efficiency 22%, latency 24 min, # awakenings 11, res desat N=10, index 17.8  Apnea 1 for 16 sec, hypopnea 16 for 18 sec, total sleep time only 90 min so index = 11 She did not sleep much and the index puts her in mild category Repeat study a few days later when she slept 187 min her AHI = 4.6 which is normal, average O2 sat 94% I don't think    Osteoarthritis 02/20/2003   Overview:  Mild bilateral hands Right CMC Right hip Left knee s/p meniscus tear All symptomatically stable Overview:  Overview:   Mild bilateral hands Right CMC Right hip Left knee s/p meniscus tear All symptomatically stable   Osteoporosis 02/20/2003   Overview:  Bone density 2004 LS -3.32 and RH-1.62 Bone density 2008 LS -3.14 and RH -1.22 Bone density 2009 LS -3.17 and RH -1.21 Bone density 2010 LS -3.14 and RH -1.42  Bone density 2014 LS -2.7 (L3 -3.1) and RH -1.5 Bone density 2017 LS -2.3 and RH -1.4 (neck -1.7) Vitamin D level 38 in 2012 On Boniva 2004 to 2010, restarted 2015 Repeat bone density in 2020 and stop Boniva  Last Assessment & Pl   Prediabetes 02/20/2003   Overview:  Glucose goal < 100, last value 91 in 2017 HgbA1c goal < 6.0, last value 5.6 in 2017 Urine microalbumin negative in 2017 On diet and exercise   Recurrent UTI 10/19/2014   Squamous cell carcinoma of skin 03/14/2019   Left mid brow. SCCis, hypertrophic. EDC   Past Surgical History:  Procedure Laterality Date   ABDOMINAL HYSTERECTOMY  1975   MASTECTOMY Left    URETEROSCOPY     URETEROSCOPY WITH HOLMIUM LASER LITHOTRIPSY     Family History  Problem Relation Age of Onset   Breast cancer Mother        dx late 38s   Skin cancer Father        non-melanoma; head   Liver cancer Sister        or other primary: d. 64   Breast cancer Maternal Grandmother        dx late 80s   Skin cancer Daughter        non-melanoma   Social History   Socioeconomic History   Marital status: Widowed    Spouse name: Not on file   Number of children: Not on file   Years of education: Not on file   Highest education level: Not on file  Occupational History   Not on file  Tobacco Use   Smoking status: Never   Smokeless tobacco: Never  Vaping Use   Vaping Use: Never used  Substance and Sexual Activity   Alcohol use: No   Drug use: No   Sexual activity: Not Currently    Birth control/protection: Post-menopausal  Other Topics Concern   Not on file  Social History Narrative   Not on file   Social Determinants of Health   Financial Resource Strain: Low  Risk  (03/29/2022)   Overall Financial Resource Strain (CARDIA)    Difficulty of Paying Living Expenses: Not hard at all  Food Insecurity: No Food Insecurity (03/29/2022)   Hunger Vital Sign    Worried About Running Out of Food in the Last Year: Never true    Ran Out of Food in the Last Year: Never true  Transportation Needs: No Transportation Needs (03/29/2022)   PRAPARE - Hydrologist (Medical): No    Lack of Transportation (Non-Medical):  No  Physical Activity: Insufficiently Active (03/29/2022)   Exercise Vital Sign    Days of Exercise per Week: 4 days    Minutes of Exercise per Session: 30 min  Stress: No Stress Concern Present (03/29/2022)   [place]    Feeling of Stress : Not at all  Social Connections: Unknown (03/29/2022)   Social Connection and Isolation Panel [NHANES]    Frequency of Communication with Friends and Family: More than three times a week    Frequency of Social Gatherings with Friends and Family: More than three times a week    Attends Religious Services: Not on file    Active Member of Clubs or Organizations: Not on file    Attends Archivist Meetings: Not on file    Marital Status: Widowed     Review of Systems  Constitutional:  Negative for appetite change and unexpected weight change.  HENT:  Negative for congestion and sinus pressure.   Respiratory:  Negative for cough, chest tightness and shortness of breath.   Cardiovascular:  Negative for chest pain, palpitations and leg swelling.  Gastrointestinal:  Negative for abdominal pain, diarrhea, nausea and vomiting.  Genitourinary:  Negative for difficulty urinating and dysuria.  Musculoskeletal:  Negative for joint swelling and myalgias.  Skin:  Negative for color change and rash.  Neurological:  Negative for dizziness and headaches.  Psychiatric/Behavioral:  Negative for agitation and dysphoric mood.         Objective:     BP 128/78   Pulse 86   Temp 97.9 F (36.6 C)   Resp 16   Ht '5\' 3"'$  (1.6 m)   Wt 138 lb (62.6 kg)   SpO2 98%   BMI 24.45 kg/m  Wt Readings from Last 3 Encounters:  09/13/22 138 lb (62.6 kg)  07/26/22 138 lb 6.4 oz (62.8 kg)  07/25/22 136 lb (61.7 kg)    Physical Exam Vitals reviewed.  Constitutional:      General: She is not in acute distress.    Appearance: Normal appearance.  HENT:     Head: Normocephalic and atraumatic.     Right Ear: External ear normal.     Left Ear: External ear normal.  Eyes:     General: No scleral icterus.       Right eye: No discharge.        Left eye: No discharge.     Conjunctiva/sclera: Conjunctivae normal.  Neck:     Thyroid: No thyromegaly.  Cardiovascular:     Rate and Rhythm: Normal rate and regular rhythm.  Pulmonary:     Effort: No respiratory distress.     Breath sounds: Normal breath sounds. No wheezing.  Abdominal:     General: Bowel sounds are normal.     Palpations: Abdomen is soft.     Tenderness: There is no abdominal tenderness.  Musculoskeletal:        General: No swelling or tenderness.     Cervical back: Neck supple. No tenderness.  Lymphadenopathy:     Cervical: No cervical adenopathy.  Skin:    Findings: No erythema or rash.  Neurological:     Mental Status: She is alert.  Psychiatric:        Mood and Affect: Mood normal.        Behavior: Behavior normal.      Outpatient Encounter Medications as of 09/13/2022  Medication Sig   amLODipine (NORVASC) 5 MG tablet TAKE 1 TABLET  BY MOUTH DAILY   Cyanocobalamin (B-12) 1000 MCG TABS TAKE 1 TABLET BY MOUTH DAILY.   escitalopram (LEXAPRO) 10 MG tablet TAKE 1 TABLET BY MOUTH DAILY (FOR MOOD/DEPRESSION)   levothyroxine (SYNTHROID, LEVOTHROID) 75 MCG tablet    pantoprazole (PROTONIX) 40 MG tablet TAKE 1 TABLET BY MOUTH 30 MINUTES BEFOREEVENING MEAL   pravastatin (PRAVACHOL) 40 MG tablet TAKE ONE TABLET EACH EVENING   triamcinolone cream  (KENALOG) 0.1 % Apply 1 Application topically 2 (two) times daily. Apply to affected area on arm bid x 1 week.   No facility-administered encounter medications on file as of 09/13/2022.     Lab Results  Component Value Date   WBC 6.7 07/25/2022   HGB 15.2 (H) 07/25/2022   HCT 46.9 (H) 07/25/2022   PLT 262 07/25/2022   GLUCOSE 111 (H) 07/25/2022   CHOL 163 07/20/2022   TRIG 130.0 07/20/2022   HDL 70.00 07/20/2022   LDLCALC 67 07/20/2022   ALT 13 07/25/2022   AST 23 07/25/2022   NA 143 07/25/2022   K 3.6 07/25/2022   CL 112 (H) 07/25/2022   CREATININE 0.69 07/25/2022   BUN 13 07/25/2022   CO2 24 07/25/2022   TSH 2.32 03/22/2022   HGBA1C 5.7 07/20/2022    CT HEAD WO CONTRAST (5MM)  Result Date: 07/25/2022 CLINICAL DATA:  Transient ischemic attack (TIA) EXAM: CT HEAD WITHOUT CONTRAST TECHNIQUE: Contiguous axial images were obtained from the base of the skull through the vertex without intravenous contrast. RADIATION DOSE REDUCTION: This exam was performed according to the departmental dose-optimization program which includes automated exposure control, adjustment of the mA and/or kV according to patient size and/or use of iterative reconstruction technique. COMPARISON:  MRI head 02/16/2022. FINDINGS: Brain: No evidence of acute large vascular territory infarction, hemorrhage, hydrocephalus, extra-axial collection or mass lesion/mass effect. Patchy white matter hypodensities, nonspecific but compatible with chronic microvascular ischemic disease. Cerebral atrophy. Vascular: No hyperdense vessel identified. Skull: No acute fracture. Sinuses/Orbits: Clear sinuses.  No acute orbital findings. Other: No mastoid effusions. IMPRESSION: 1. No evidence of acute intracranial abnormality. 2. Chronic microvascular ischemic disease and cerebral atrophy. Electronically Signed   By: Margaretha Sheffield M.D.   On: 07/25/2022 10:10       Assessment & Plan:  Essential (primary) hypertension Assessment &  Plan: On amlodipine.  Blood pressure as outlined.  No changes made in medication.  Follow pressures.  Follow metabolic panel.   Orders: -     Basic metabolic panel; Future  Hyperlipidemia, unspecified hyperlipidemia type Assessment & Plan: On pravastatin.  Low cholesterol diet and exercise.  Follow lipid panel and liver function tests.    Orders: -     Lipid panel; Future -     Hepatic function panel; Future  Hyperglycemia Assessment & Plan: Follow met b and a1c.   Orders: -     Hemoglobin A1c; Future  Depression, major, single episode, complete remission [facility]) Assessment & Plan: On lexapro. Stable.  Follow.     History of breast cancer Assessment & Plan: S/p mastectomy.  Prescribed arimidex.  Desired not to take. Followed by oncology.     Hypothyroidism, unspecified type Assessment & Plan: On thyroid replacement.  Follow tsh.    Osteoporosis without current pathological fracture, unspecified osteoporosis type Assessment & Plan: Has been on boniva.  Gets her bone density tests through [facility].  They recently stopped boniva.  Follow.    Thoracic aortic ectasia ([facility]) Assessment & Plan: Continue blood pressure control - amlodipine.  Continue  pravastatin.    Hand numbness Assessment & Plan: Left more than right.  Worse in am.  Shakes out - improves.  Trial of cock up wrist splint for CTS.  Call with update.  If persistent, consider NCS.     Hypoxia Assessment & Plan: Using overnight oxygen.  Just started.  Follow.       Einar Pheasant, MD

## 2022-09-13 NOTE — Patient Instructions (Signed)
Cock up wrist splint 

## 2022-09-18 ENCOUNTER — Encounter: Payer: Self-pay | Admitting: Internal Medicine

## 2022-09-18 DIAGNOSIS — R0902 Hypoxemia: Secondary | ICD-10-CM | POA: Insufficient documentation

## 2022-09-18 DIAGNOSIS — R200 Anesthesia of skin: Secondary | ICD-10-CM | POA: Insufficient documentation

## 2022-09-18 NOTE — Assessment & Plan Note (Signed)
On amlodipine.  Blood pressure as outlined.  No changes made in medication.  Follow pressures.  Follow metabolic panel.

## 2022-09-18 NOTE — Assessment & Plan Note (Signed)
On pravastatin.  Low cholesterol diet and exercise.  Follow lipid panel and liver function tests.   

## 2022-09-18 NOTE — Assessment & Plan Note (Signed)
Using overnight oxygen.  Just started.  Follow.

## 2022-09-18 NOTE — Assessment & Plan Note (Signed)
Has been on boniva.  Gets her bone density tests through [facility].  They recently stopped boniva.  Follow.

## 2022-09-18 NOTE — Assessment & Plan Note (Signed)
On lexapro.  Stable.  Follow.   

## 2022-09-18 NOTE — Assessment & Plan Note (Signed)
S/p mastectomy.  Prescribed arimidex.  Desired not to take. Followed by oncology.

## 2022-09-18 NOTE — Assessment & Plan Note (Signed)
On thyroid replacement.  Follow tsh.  

## 2022-09-18 NOTE — Assessment & Plan Note (Signed)
Continue blood pressure control - amlodipine.  Continue pravastatin.

## 2022-09-18 NOTE — Assessment & Plan Note (Signed)
Left more than right.  Worse in am.  Shakes out - improves.  Trial of cock up wrist splint for CTS.  Call with update.  If persistent, consider NCS.

## 2022-09-18 NOTE — Assessment & Plan Note (Signed)
Follow met b and a1c.  

## 2022-09-28 DIAGNOSIS — E7800 Pure hypercholesterolemia, unspecified: Secondary | ICD-10-CM | POA: Diagnosis not present

## 2022-09-28 DIAGNOSIS — I38 Endocarditis, valve unspecified: Secondary | ICD-10-CM | POA: Diagnosis not present

## 2022-09-28 DIAGNOSIS — E042 Nontoxic multinodular goiter: Secondary | ICD-10-CM | POA: Diagnosis not present

## 2022-09-28 DIAGNOSIS — F418 Other specified anxiety disorders: Secondary | ICD-10-CM | POA: Diagnosis not present

## 2022-09-28 DIAGNOSIS — Z780 Asymptomatic menopausal state: Secondary | ICD-10-CM | POA: Diagnosis not present

## 2022-09-28 DIAGNOSIS — M8589 Other specified disorders of bone density and structure, multiple sites: Secondary | ICD-10-CM | POA: Diagnosis not present

## 2022-09-28 DIAGNOSIS — Z0000 Encounter for general adult medical examination without abnormal findings: Secondary | ICD-10-CM | POA: Diagnosis not present

## 2022-09-28 DIAGNOSIS — F4323 Adjustment disorder with mixed anxiety and depressed mood: Secondary | ICD-10-CM | POA: Diagnosis not present

## 2022-09-28 DIAGNOSIS — M158 Other polyosteoarthritis: Secondary | ICD-10-CM | POA: Diagnosis not present

## 2022-09-28 DIAGNOSIS — F5105 Insomnia due to other mental disorder: Secondary | ICD-10-CM | POA: Diagnosis not present

## 2022-09-28 DIAGNOSIS — E039 Hypothyroidism, unspecified: Secondary | ICD-10-CM | POA: Diagnosis not present

## 2022-10-09 DIAGNOSIS — G4733 Obstructive sleep apnea (adult) (pediatric): Secondary | ICD-10-CM | POA: Diagnosis not present

## 2022-10-09 DIAGNOSIS — G4736 Sleep related hypoventilation in conditions classified elsewhere: Secondary | ICD-10-CM | POA: Diagnosis not present

## 2022-10-31 ENCOUNTER — Other Ambulatory Visit: Payer: Self-pay | Admitting: Internal Medicine

## 2022-11-07 DIAGNOSIS — G4736 Sleep related hypoventilation in conditions classified elsewhere: Secondary | ICD-10-CM | POA: Diagnosis not present

## 2022-11-07 DIAGNOSIS — G4733 Obstructive sleep apnea (adult) (pediatric): Secondary | ICD-10-CM | POA: Diagnosis not present

## 2022-11-28 ENCOUNTER — Telehealth: Payer: Self-pay

## 2022-11-28 NOTE — Telephone Encounter (Signed)
Fayrene Fearing states the fax number for [employer] is [phone].

## 2022-11-28 NOTE — Telephone Encounter (Signed)
Patient's son, Trasha Rubert, called to state patient is no longer requiring her [employer] oxygen supply to breathe.  Fayrene Fearing states patient would like to return the oxygen supplies to [employer], but they require a letter from Dr. Dale Onsted.  Fayrene Fearing states he would like to know if Dr. Lorin Picket would write a letter for patient so she can return the supplies.

## 2022-11-29 NOTE — Telephone Encounter (Signed)
Advised that pulmonary prescribed oxygen. Would need to f/u with them. Provided number to patients son.

## 2022-12-05 DIAGNOSIS — Z170 Estrogen receptor positive status [ER+]: Secondary | ICD-10-CM | POA: Diagnosis not present

## 2022-12-05 DIAGNOSIS — Z9189 Other specified personal risk factors, not elsewhere classified: Secondary | ICD-10-CM | POA: Diagnosis not present

## 2022-12-05 DIAGNOSIS — Z1231 Encounter for screening mammogram for malignant neoplasm of breast: Secondary | ICD-10-CM | POA: Diagnosis not present

## 2022-12-05 DIAGNOSIS — C50112 Malignant neoplasm of central portion of left female breast: Secondary | ICD-10-CM | POA: Diagnosis not present

## 2022-12-05 DIAGNOSIS — R92321 Mammographic fibroglandular density, right breast: Secondary | ICD-10-CM | POA: Diagnosis not present

## 2022-12-05 DIAGNOSIS — Z9012 Acquired absence of left breast and nipple: Secondary | ICD-10-CM | POA: Diagnosis not present

## 2022-12-05 DIAGNOSIS — Z853 Personal history of malignant neoplasm of breast: Secondary | ICD-10-CM | POA: Diagnosis not present

## 2022-12-06 ENCOUNTER — Telehealth: Payer: Self-pay | Admitting: Internal Medicine

## 2022-12-06 NOTE — Telephone Encounter (Signed)
Placed up front for pickup ° °

## 2022-12-06 NOTE — Telephone Encounter (Signed)
Pt called in staying that she needs a handicap sticker. Any questions, she's available @ [phone]

## 2022-12-06 NOTE — Telephone Encounter (Signed)
Placed in folder for signature

## 2022-12-06 NOTE — Telephone Encounter (Signed)
Yes

## 2022-12-06 NOTE — Telephone Encounter (Signed)
Signed and placed in box.   

## 2022-12-06 NOTE — Telephone Encounter (Signed)
Ok to fill out handicap form 

## 2022-12-07 ENCOUNTER — Telehealth: Payer: Self-pay | Admitting: Internal Medicine

## 2022-12-07 ENCOUNTER — Other Ambulatory Visit (INDEPENDENT_AMBULATORY_CARE_PROVIDER_SITE_OTHER): Payer: Medicare PPO

## 2022-12-07 DIAGNOSIS — R739 Hyperglycemia, unspecified: Secondary | ICD-10-CM | POA: Diagnosis not present

## 2022-12-07 DIAGNOSIS — I10 Essential (primary) hypertension: Secondary | ICD-10-CM

## 2022-12-07 DIAGNOSIS — E785 Hyperlipidemia, unspecified: Secondary | ICD-10-CM | POA: Diagnosis not present

## 2022-12-07 DIAGNOSIS — G4734 Idiopathic sleep related nonobstructive alveolar hypoventilation: Secondary | ICD-10-CM

## 2022-12-07 LAB — BASIC METABOLIC PANEL
BUN: 13 mg/dL (ref 6–23)
CO2: 29 mEq/L (ref 19–32)
Calcium: 9.5 mg/dL (ref 8.4–10.5)
Chloride: 107 mEq/L (ref 96–112)
Creatinine, Ser: 0.84 mg/dL (ref 0.40–1.20)
GFR: 59.78 mL/min — ABNORMAL LOW (ref >60.00)
Glucose, Bld: 88 mg/dL (ref 70–99)
Potassium: 4.1 mEq/L (ref 3.5–5.1)
Sodium: 145 mEq/L (ref 135–145)

## 2022-12-07 LAB — HEPATIC FUNCTION PANEL
ALT: 9 U/L (ref 0–35)
AST: 17 U/L (ref 0–37)
Albumin: 3.9 g/dL (ref 3.5–5.2)
Alkaline Phosphatase: 61 U/L (ref 39–117)
Bilirubin, Direct: 0.2 mg/dL (ref 0.0–0.3)
Total Bilirubin: 0.8 mg/dL (ref 0.2–1.2)
Total Protein: 6.3 g/dL (ref 6.0–8.3)

## 2022-12-07 LAB — LIPID PANEL
Cholesterol: 153 mg/dL (ref 0–200)
HDL: 69.7 mg/dL (ref >39.00)
LDL Cholesterol: 62 mg/dL (ref 0–99)
NonHDL: 83.58
Total CHOL/HDL Ratio: 2
Triglycerides: 108 mg/dL (ref 0.0–149.0)
VLDL: 21.6 mg/dL (ref 0.0–40.0)

## 2022-12-07 LAB — HEMOGLOBIN A1C: Hgb A1c MFr Bld: 5.6 % (ref 4.6–6.5)

## 2022-12-07 NOTE — Telephone Encounter (Signed)
Pt's son states mom isn't using the o2 at night would like to have it returned to the DME is asking for [facility]asa to write order

## 2022-12-07 NOTE — Telephone Encounter (Signed)
I have placed the order to have the O2 discontinued and I notified the patient's son.  Nothing further needed.

## 2022-12-07 NOTE — Telephone Encounter (Signed)
I spoke with the patient's son(DPR). He said she has not been using it for a couple of months. He it was keeping her up at night and more trouble then it is worth. He said she said that she was not going to use it anymore.   Dr. Belia Heman please advise.

## 2022-12-08 ENCOUNTER — Telehealth: Payer: Self-pay

## 2022-12-08 NOTE — Telephone Encounter (Signed)
-----   Message from Dale Vinton, MD sent at 12/08/2022  3:34 AM EDT ----- Notify - kidney function stable.  Overall sugar control ok.  Cholesterol levels look good.  Liver function tests are wnl.

## 2022-12-09 ENCOUNTER — Encounter: Payer: Self-pay | Admitting: *Deleted

## 2022-12-09 DIAGNOSIS — Z961 Presence of intraocular lens: Secondary | ICD-10-CM | POA: Diagnosis not present

## 2022-12-21 ENCOUNTER — Ambulatory Visit: Payer: Self-pay

## 2022-12-21 NOTE — Patient Outreach (Signed)
  Care Coordination   Initial Visit Note   12/21/2022 Name: Jennifer Bernard MRN: [id number] DOB: Jun 03, 1927  Jennifer Bernard is a 89 y.o. year old female who sees Dale Reynoldsburg, MD for primary care. I spoke with  Cori Razor by phone today.  What matters to the patients health and wellness today?  CM educated patient on [facility] services.  Patient feels that she is able to manage her medical needs on her own, but agreed to speak to her doctor if she should need [facility] services in the future.  She plans to speak with her doctor about exercise and medication management.    Goals Addressed   None     SDOH assessments and interventions completed:  No     Care Coordination Interventions:  No, not indicated   Follow up plan: No further intervention required.   Encounter Outcome:  Pt. Refused

## 2023-01-03 ENCOUNTER — Other Ambulatory Visit: Payer: Self-pay | Admitting: Internal Medicine

## 2023-01-12 ENCOUNTER — Ambulatory Visit (INDEPENDENT_AMBULATORY_CARE_PROVIDER_SITE_OTHER): Payer: Medicare PPO | Admitting: Dermatology

## 2023-01-12 DIAGNOSIS — Z1283 Encounter for screening for malignant neoplasm of skin: Secondary | ICD-10-CM

## 2023-01-12 DIAGNOSIS — Z8589 Personal history of malignant neoplasm of other organs and systems: Secondary | ICD-10-CM

## 2023-01-12 DIAGNOSIS — L988 Other specified disorders of the skin and subcutaneous tissue: Secondary | ICD-10-CM

## 2023-01-12 DIAGNOSIS — Z79899 Other long term (current) drug therapy: Secondary | ICD-10-CM

## 2023-01-12 DIAGNOSIS — D229 Melanocytic nevi, unspecified: Secondary | ICD-10-CM

## 2023-01-12 DIAGNOSIS — L578 Other skin changes due to chronic exposure to nonionizing radiation: Secondary | ICD-10-CM | POA: Diagnosis not present

## 2023-01-12 DIAGNOSIS — I8393 Asymptomatic varicose veins of bilateral lower extremities: Secondary | ICD-10-CM

## 2023-01-12 DIAGNOSIS — L821 Other seborrheic keratosis: Secondary | ICD-10-CM

## 2023-01-12 DIAGNOSIS — L609 Nail disorder, unspecified: Secondary | ICD-10-CM

## 2023-01-12 DIAGNOSIS — W908XXA Exposure to other nonionizing radiation, initial encounter: Secondary | ICD-10-CM

## 2023-01-12 DIAGNOSIS — D692 Other nonthrombocytopenic purpura: Secondary | ICD-10-CM | POA: Diagnosis not present

## 2023-01-12 DIAGNOSIS — L820 Inflamed seborrheic keratosis: Secondary | ICD-10-CM | POA: Diagnosis not present

## 2023-01-12 DIAGNOSIS — Z85828 Personal history of other malignant neoplasm of skin: Secondary | ICD-10-CM

## 2023-01-12 DIAGNOSIS — L814 Other melanin hyperpigmentation: Secondary | ICD-10-CM

## 2023-01-12 DIAGNOSIS — X32XXXA Exposure to sunlight, initial encounter: Secondary | ICD-10-CM

## 2023-01-12 NOTE — Progress Notes (Signed)
Follow-Up Visit   Subjective  Jennifer Bernard is a 89 y.o. female who presents for the following: Botox for facial elastosis, patient has broken and chipped areas in her finger nails that she is concerned about and a rash on the R popliteal area that she noticed the other day.  The following portions of the chart were reviewed this encounter and updated as appropriate: medications, allergies, medical history  Review of Systems:  No other skin or systemic complaints except as noted in HPI or Assessment and Plan.  Objective  Well appearing patient in no apparent distress; mood and affect are within normal limits.  A focused examination was performed of the face, trunk, and extremities  Relevant physical exam findings are noted in the Assessment and Plan.    L hand x 1, R side x 1, L leg x 2 (4) Erythematous stuck-on, waxy papule or plaque    Assessment & Plan   Inflamed seborrheic keratosis (4) L hand x 1, R side x 1, L leg x 2  Symptomatic, irritating, patient would like treated.   Destruction of lesion - L hand x 1, R side x 1, L leg x 2 Complexity: simple   Destruction method: cryotherapy   Informed consent: discussed and consent obtained   Timeout:  patient name, date of birth, surgical site, and procedure verified Lesion destroyed using liquid nitrogen: Yes   Region frozen until ice ball extended beyond lesion: Yes   Outcome: patient tolerated procedure well with no complications   Post-procedure details: wound care instructions given       Facial Elastosis  Location: See attached image  Informed consent: Discussed risks (infection, pain, bleeding, bruising, swelling, allergic reaction, paralysis of nearby muscles, eyelid droop, double vision, neck weakness, difficulty breathing, headache, undesirable cosmetic result, and need for additional treatment) and benefits of the procedure, as well as the alternatives.  Informed consent was obtained.  Preparation: The  area was cleansed with alcohol.  Procedure Details:  Botox was injected into the dermis with a 30-gauge needle. Pressure applied to any bleeding. Ice packs offered for swelling.  Lot Number:  [id number] Expiration:  12/2024  Total Units Injected:  40  Plan: Tylenol may be used for headache.  Allow 2 weeks before returning to clinic for additional dosing as needed. Patient will call for any problems.  Purpura, R popliteal - Chronic; persistent and recurrent.  Treatable, but not curable. - Violaceous macules and patches - Benign - Related to trauma, age, sun damage and/or use of blood thinners, chronic use of topical and/or oral steroids - Observe - Can use OTC arnica containing moisturizer such as Dermend Bruise Formula if desired - Call for worsening or other concerns  NAIL PROBLEM Exam: Finger nail breakage.  Treatment Plan: Recommend Biotin supplements 2.5 mg po QD and Dermanail liquid nail hardener.   SEBORRHEIC KERATOSIS - Stuck-on, waxy, tan-brown papules and/or plaques  - Benign-appearing - Discussed benign etiology and prognosis. - Observe - Call for any changes  MELANOCYTIC NEVI Exam: Tan-brown and/or pink-flesh-colored symmetric macules and papules  Treatment Plan: Benign appearing on exam today. Recommend observation. Call clinic for new or changing moles. Recommend daily use of broad spectrum spf 30+ sunscreen to sun-exposed areas.   LENTIGINES Exam: scattered tan macules Due to sun exposure Treatment Plan: Benign-appearing, observe. Recommend daily broad spectrum sunscreen SPF 30+ to sun-exposed areas, reapply every 2 hours as needed.  Call for any changes  ACTINIC DAMAGE - chronic, secondary to cumulative UV  radiation exposure/sun exposure over time - diffuse scaly erythematous macules with underlying dyspigmentation - Recommend daily broad spectrum sunscreen SPF 30+ to sun-exposed areas, reapply every 2 hours as needed.  - Recommend staying in the shade or  wearing long sleeves, sun glasses (UVA+UVB protection) and wide brim hats (4-inch brim around the entire circumference of the hat). - Call for new or changing lesions.  HISTORY OF BASAL CELL CARCINOMA OF THE SKIN - No evidence of recurrence today - Recommend regular full body skin exams - Recommend daily broad spectrum sunscreen SPF 30+ to sun-exposed areas, reapply every 2 hours as needed.  - Call if any new or changing lesions are noted between office visits  HISTORY OF SQUAMOUS CELL CARCINOMA OF THE SKIN - No evidence of recurrence today - No lymphadenopathy - Recommend regular full body skin exams - Recommend daily broad spectrum sunscreen SPF 30+ to sun-exposed areas, reapply every 2 hours as needed.  - Call if any new or changing lesions are noted between office visits  Varicose Veins/Spider Veins - Dilated blue, purple or red veins at the lower extremities - Reassured - Smaller vessels can be treated by sclerotherapy (a procedure to inject a medicine into the veins to make them disappear) if desired, but the treatment is not covered by insurance. Larger vessels may be covered if symptomatic and we would refer to vascular surgeon if treatment desired.  History of breast cancer - L breast, no LAD, observe.  Skin cancer screening performed today.   Return in about 4 months (around 05/15/2023) for Botox injections.  Maylene Roes, CMA, am acting as scribe for Armida Sans, MD .  Documentation: I have reviewed the above documentation for accuracy and completeness, and I agree with the above.  Armida Sans, MD

## 2023-01-12 NOTE — Patient Instructions (Addendum)
Recommend over the counter Biotin 2.5 mg daily.  Recommend Dermanail nail hardener (see photo)     Due to recent changes in healthcare laws, you may see results of your pathology and/or laboratory studies on MyChart before the doctors have had a chance to review them. We understand that in some cases there may be results that are confusing or concerning to you. Please understand that not all results are received at the same time and often the doctors may need to interpret multiple results in order to provide you with the best plan of care or course of treatment. Therefore, we ask that you please give [place] 2 business days to thoroughly review all your results before contacting the office for clarification. Should we see a critical lab result, you will be contacted sooner.   If You Need Anything After Your Visit  If you have any questions or concerns for your doctor, please call our main line at [phone] and press option 4 to reach your doctor's medical assistant. If no one answers, please leave a voicemail as directed and we will return your call as soon as possible. Messages left after 4 pm will be answered the following business day.   You may also send [place] a message via MyChart. We typically respond to MyChart messages within 1-2 business days.  For prescription refills, please ask your pharmacy to contact our office. Our fax number is [phone].  If you have an urgent issue when the clinic is closed that cannot wait until the next business day, you can page your doctor at the number below.    Please note that while we do our best to be available for urgent issues outside of office hours, we are not available 24/7.   If you have an urgent issue and are unable to reach [place], you may choose to seek medical care at your doctor's office, retail clinic, urgent care center, or emergency room.  If you have a medical emergency, please immediately call 911 or go to the emergency  department.  Pager Numbers  - Dr. Gwen Pounds: [phone]  - Dr. Neale Burly: [phone]  - Dr. Roseanne Reno: [phone]  In the event of inclement weather, please call our main line at [phone] for an update on the status of any delays or closures.  Dermatology Medication Tips: Please keep the boxes that topical medications come in in order to help keep track of the instructions about where and how to use these. Pharmacies typically print the medication instructions only on the boxes and not directly on the medication tubes.   If your medication is too expensive, please contact our office at [phone] option 4 or send [place] a message through MyChart.   We are unable to tell what your co-pay for medications will be in advance as this is different depending on your insurance coverage. However, we may be able to find a substitute medication at lower cost or fill out paperwork to get insurance to cover a needed medication.   If a prior authorization is required to get your medication covered by your insurance company, please allow [place] 1-2 business days to complete this process.  Drug prices often vary depending on where the prescription is filled and some pharmacies may offer cheaper prices.  The website www.goodrx.com contains coupons for medications through different pharmacies. The prices here do not account for what the cost may be with help from insurance (it may be cheaper with your insurance), but the website can give you the price  if you did not use any insurance.  - You can print the associated coupon and take it with your prescription to the pharmacy.  - You may also stop by our office during regular business hours and pick up a GoodRx coupon card.  - If you need your prescription sent electronically to a different pharmacy, notify our office through [facility] 2 or by phone at [phone] option 4.     Si Usted Necesita Algo Despus de Su Visita  Tambin puede enviarnos un  mensaje a travs de Clinical cytogeneticist. Por lo general respondemos a los mensajes de MyChart en el transcurso de 1 a 2 das hbiles.  Para renovar recetas, por favor pida a su farmacia que se ponga en contacto con nuestra oficina. Annie Sable de fax es [place] [phone].  Si tiene un asunto urgente cuando la clnica est cerrada y que no puede esperar hasta el siguiente da hbil, puede llamar/localizar a su doctor(a) al nmero que aparece a continuacin.   Por favor, tenga en cuenta que aunque hacemos todo lo posible para estar disponibles para asuntos urgentes fuera del horario de [place], no estamos disponibles las 24 horas del da, los 7 [address]  [address] de la [place].   Si tiene un problema urgente y no puede comunicarse con nosotros, puede optar por buscar atencin mdica  en el consultorio de su doctor(a), en una clnica privada, en un centro de atencin urgente o en una sala de emergencias.  Si tiene Engineer, drilling, por favor llame inmediatamente al 911 o vaya a la sala de emergencias.  Nmeros de bper  - Dr. Gwen Pounds: [phone]  - Dra. Moye: [phone]  - Dra. Roseanne Reno: [phone]  En caso de inclemencias del [place], por favor llame a Lacy Duverney principal al [phone] para una actualizacin sobre el [place] de cualquier retraso o cierre.  Consejos para la medicacin en dermatologa: Por favor, guarde las cajas en las que vienen los medicamentos de uso tpico para ayudarle a seguir las instrucciones sobre dnde y cmo usarlos. Las farmacias generalmente imprimen las instrucciones del medicamento slo en las cajas y no directamente en los tubos del [place].   Si su medicamento es muy caro, por favor, pngase en contacto con Rolm Gala llamando al [phone] y presione la opcin 4 o envenos un mensaje a travs de Clinical cytogeneticist.   No podemos decirle cul ser su copago por los medicamentos por adelantado ya que esto es diferente dependiendo de la cobertura de su seguro. Sin embargo,  es posible que podamos encontrar un medicamento sustituto a Audiological scientist un formulario para que el seguro cubra el medicamento que se considera necesario.   Si se requiere una autorizacin previa para que su compaa de seguros [place] su medicamento, por favor permtanos de 1 a 2 das hbiles para completar [address]  Los precios de los medicamentos varan con frecuencia dependiendo del Environmental consultant de dnde se surte la receta y alguna farmacias pueden ofrecer precios ms baratos.  El sitio web www.goodrx.com tiene cupones para medicamentos de Health and safety inspector. Los precios aqu no tienen en cuenta lo que podra costar con la ayuda del seguro (puede ser ms barato con su seguro), pero el sitio web puede darle el precio si no utiliz Tourist information centre manager.  - Puede imprimir el cupn correspondiente y llevarlo con su receta a la farmacia.  - Tambin puede pasar por nuestra oficina durante el horario de atencin regular y Education officer, museum una tarjeta de cupones de GoodRx.  - Si necesita que su  receta se enve electrnicamente a una farmacia diferente, informe a nuestra oficina a travs de MyChart de [place] o por telfono llamando al [phone] y presione la opcin 4.

## 2023-01-13 ENCOUNTER — Encounter: Payer: Self-pay | Admitting: Internal Medicine

## 2023-01-13 ENCOUNTER — Ambulatory Visit (INDEPENDENT_AMBULATORY_CARE_PROVIDER_SITE_OTHER): Payer: Medicare PPO | Admitting: Internal Medicine

## 2023-01-13 VITALS — BP 128/72 | HR 81 | Temp 98.0°F | Resp 16 | Ht 63.0 in | Wt 138.0 lb

## 2023-01-13 DIAGNOSIS — R739 Hyperglycemia, unspecified: Secondary | ICD-10-CM | POA: Diagnosis not present

## 2023-01-13 DIAGNOSIS — E042 Nontoxic multinodular goiter: Secondary | ICD-10-CM

## 2023-01-13 DIAGNOSIS — I10 Essential (primary) hypertension: Secondary | ICD-10-CM | POA: Diagnosis not present

## 2023-01-13 DIAGNOSIS — E039 Hypothyroidism, unspecified: Secondary | ICD-10-CM

## 2023-01-13 DIAGNOSIS — F325 Major depressive disorder, single episode, in full remission: Secondary | ICD-10-CM

## 2023-01-13 DIAGNOSIS — T148XXA Other injury of unspecified body region, initial encounter: Secondary | ICD-10-CM | POA: Diagnosis not present

## 2023-01-13 DIAGNOSIS — I77810 Thoracic aortic ectasia: Secondary | ICD-10-CM | POA: Diagnosis not present

## 2023-01-13 DIAGNOSIS — E785 Hyperlipidemia, unspecified: Secondary | ICD-10-CM | POA: Diagnosis not present

## 2023-01-13 DIAGNOSIS — Z853 Personal history of malignant neoplasm of breast: Secondary | ICD-10-CM

## 2023-01-13 LAB — CBC WITH DIFFERENTIAL/PLATELET
Absolute Monocytes: 949 cells/uL (ref 200–950)
Basophils Absolute: 56 cells/uL (ref 0–200)
Basophils Relative: 0.6 %
Eosinophils Absolute: 85 cells/uL (ref 15–500)
Eosinophils Relative: 0.9 %
HCT: 45 % (ref 35.0–45.0)
Hemoglobin: 14.3 g/dL (ref 11.7–15.5)
Lymphs Abs: 1993 cells/uL (ref 850–3900)
MCH: 29.2 pg (ref 27.0–33.0)
MCHC: 31.8 g/dL — ABNORMAL LOW (ref 32.0–36.0)
MCV: 91.8 fL (ref 80.0–100.0)
MPV: 9.9 fL (ref 7.5–12.5)
Monocytes Relative: 10.1 %
Neutro Abs: 6317 cells/uL (ref 1500–7800)
Neutrophils Relative %: 67.2 %
Platelets: 292 10*3/uL (ref 140–400)
RBC: 4.9 10*6/uL (ref 3.80–5.10)
RDW: 12.8 % (ref 11.0–15.0)
Total Lymphocyte: 21.2 %
WBC: 9.4 10*3/uL (ref 3.8–10.8)

## 2023-01-13 NOTE — Progress Notes (Unsigned)
Subjective:    Patient ID: Jennifer Bernard, female    DOB: 18-Mar-1927, 89 y.o.   MRN: [id number]  Patient here for  Chief Complaint  Patient presents with   Medical Management of Chronic Issues    HPI Here to follow up regarding depression, hypertension, hypothyroidism and hypercholesterolemia. She is accompanied by her daughter. History obtained from both of them. Had f/u with oncology - 11/2022.  Mammogram - ok.  She gets a comprehensive health evaluation at [facility].  Per review, planning f/u echo at next appt.  They sent in rx for losartan. Not sure if she is taking.  Blood pressure is good today.  Off boniva now.  No chest pain.  Breathing stable.  Not using oxygen at night now.  No abdominal pain.  Bruising/hematoma - noted - leg (upper calf, popliteal and lower posterior thigh). No known injury or trauma.     Past Medical History:  Diagnosis Date   Asymptomatic menopausal state 02/20/1984   Overview:  Naturally occuring and asymptomatic Vaginal atrophy present on topical estrogen Overview:  Overview:  Naturally occuring and asymptomatic Vaginal atrophy present on topical estrogen   Basal cell carcinoma of skin 02/19/2013   Overview:  Basal cell on her nasolabial fold 2014 S/p Mohs by Dr. Adriana Simas Followed by local dermatologist regularly Overview:  Overview:  Followed by Dr. Adriana Simas   Cyst of ovary 02/19/2009   Overview:  Ovarian cyst on right stable 2004-2010 Cyst ? larger on CT 2012 Stable on follow up US Followed by Dr. Leda Quail Overview:  Overview:  Ovarian cyst on right stable 2004-2010 Cyst ? larger on CT 2012 Stable on follow up US Followed by Dr. Leda Quail   Depression, major, single episode, complete remission (HCC) 07/18/2017   Diffuse cystic mastopathy of breast 02/19/2013   Overview:  Mother with breast cancer later in life Two breast biopsies both with normal pathology Annual mammograms recommended, last 2016 Followed at the [facility]  Last Assessment & Plan:   Relevant Hx: Course: Daily Update: Today's Plan: Overview:  Overview:  Mother with breast cancer later in life Two breast biopsies both with normal pathology Annual mammograms recommended, last 201   Essential (primary) hypertension 02/19/2013   Overview:  Goal blood pressure < 135/85 On atenolol Overview:  Overview:  Goal blood pressure < 135/85 On atenolol   Family history of breast cancer 05/12/2022   Hyperlipidemia 02/19/2013   Overview:  Goal LDL < 130, last value 92 in 2017 On Pravachol Overview:  Overview:  Goal LDL < 130, last value 82 in 2012 On Pravachol   Hypothyroidism 02/28/2012   Overview:  started on T4  TFT normal 2017 Followed by Dr. Clydie Braun Overview:  Overview:  started on T4  TFT normal 2014 Followed by Dr. Clydie Braun   Impaired glucose tolerance 02/20/2003   Overview:  Overview:  Glucose goal < 100, last value 94 in 2014 HgbA1c goal < 6.0, last value 5.3 in 2014 Urine microalbumin negative in 2014 On diet and exercise   Nontoxic multinodular goiter 03/06/2003   Overview:  Asymptomatic nodular thyroid Stable [place] 2004-2014 Followed by Dr. Clydie Braun Overview:  Overview:  Asymptomatic nodular thyroid Stable [place] 2004-2014 Followed by Dr. Clydie Braun   OSA on CPAP 01/13/2015   Overview:  Sleep study sent to medical records:   Sleep efficiency 22%, latency 24 min, # awakenings 11, res desat N=10, index 17.8  Apnea 1 for 16 sec, hypopnea 16 for 18 sec, total sleep time only  90 min so index = 11 She did not sleep much and the index puts her in mild category Repeat study a few days later when she slept 187 min her AHI = 4.6 which is normal, average O2 sat 94% I don't think    Osteoarthritis 02/20/2003   Overview:  Mild bilateral hands Right CMC Right hip Left knee s/p meniscus tear All symptomatically stable Overview:  Overview:  Mild bilateral hands Right CMC Right hip Left knee s/p meniscus tear All symptomatically stable   Osteoporosis 02/20/2003   Overview:  Bone density 2004 LS -3.32 and RH-1.62 Bone  density 2008 LS -3.14 and RH -1.22 Bone density 2009 LS -3.17 and RH -1.21 Bone density 2010 LS -3.14 and RH -1.42  Bone density 2014 LS -2.7 (L3 -3.1) and RH -1.5 Bone density 2017 LS -2.3 and RH -1.4 (neck -1.7) Vitamin D level 38 in 2012 On Boniva 2004 to 2010, restarted 2015 Repeat bone density in 2020 and stop Boniva  Last Assessment & Pl   Prediabetes 02/20/2003   Overview:  Glucose goal < 100, last value 91 in 2017 HgbA1c goal < 6.0, last value 5.6 in 2017 Urine microalbumin negative in 2017 On diet and exercise   Recurrent UTI 10/19/2014   Squamous cell carcinoma of skin 03/14/2019   Left mid brow. SCCis, hypertrophic. EDC   Past Surgical History:  Procedure Laterality Date   ABDOMINAL HYSTERECTOMY  1975   MASTECTOMY Left    URETEROSCOPY     URETEROSCOPY WITH HOLMIUM LASER LITHOTRIPSY     Family History  Problem Relation Age of Onset   Breast cancer Mother        dx late 66s   Skin cancer Father        non-melanoma; head   Liver cancer Sister        or other primary: d. 36   Breast cancer Maternal Grandmother        dx late 80s   Skin cancer Daughter        non-melanoma   Social History   Socioeconomic History   Marital status: Widowed    Spouse name: Not on file   Number of children: Not on file   Years of education: Not on file   Highest education level: Not on file  Occupational History   Not on file  Tobacco Use   Smoking status: Never   Smokeless tobacco: Never  Vaping Use   Vaping Use: Never used  Substance and Sexual Activity   Alcohol use: No   Drug use: No   Sexual activity: Not Currently    Birth control/protection: Post-menopausal  Other Topics Concern   Not on file  Social History Narrative   Not on file   Social Determinants of Health   Financial Resource Strain: Low Risk  (03/29/2022)   Overall Financial Resource Strain (CARDIA)    Difficulty of Paying Living Expenses: Not hard at all  Food Insecurity: No Food Insecurity (03/29/2022)   Hunger  Vital Sign    Worried About Running Out of Food in the Last Year: Never true    Ran Out of Food in the Last Year: Never true  Transportation Needs: No Transportation Needs (03/29/2022)   PRAPARE - Administrator, Civil Service (Medical): No    Lack of Transportation (Non-Medical): No  Physical Activity: Insufficiently Active (03/29/2022)   Exercise Vital Sign    Days of Exercise per Week: 4 days    Minutes of Exercise per  Session: 30 min  Stress: No Stress Concern Present (03/29/2022)   [employer] of Occupational Health - Occupational Stress Questionnaire    Feeling of Stress : Not at all  Social Connections: Unknown (03/29/2022)   Social Connection and Isolation Panel [NHANES]    Frequency of Communication with Friends and Family: More than three times a week    Frequency of Social Gatherings with Friends and Family: More than three times a week    Attends Religious Services: Not on file    Active Member of Clubs or Organizations: Not on file    Attends Banker Meetings: Not on file    Marital Status: Widowed     Review of Systems  Constitutional:  Negative for appetite change and unexpected weight change.  HENT:  Negative for congestion and sinus pressure.   Respiratory:  Negative for cough and chest tightness.        Breathing stable.   Cardiovascular:  Negative for chest pain, palpitations and leg swelling.  Gastrointestinal:  Negative for abdominal pain, diarrhea, nausea and vomiting.  Genitourinary:  Negative for difficulty urinating and dysuria.  Musculoskeletal:  Negative for joint swelling and myalgias.  Skin:  Negative for color change and rash.  Neurological:  Negative for dizziness and headaches.  Psychiatric/Behavioral:  Negative for agitation and dysphoric mood.        Objective:     BP 128/72   Pulse 81   Temp 98 F (36.7 C)   Resp 16   Ht 5\' 3"  (1.6 m)   Wt 138 lb (62.6 kg)   SpO2 98%   BMI 24.45 kg/m  Wt Readings from  Last 3 Encounters:  01/13/23 138 lb (62.6 kg)  09/13/22 138 lb (62.6 kg)  07/26/22 138 lb 6.4 oz (62.8 kg)    Physical Exam Vitals reviewed.  Constitutional:      General: She is not in acute distress.    Appearance: Normal appearance.  HENT:     Head: Normocephalic and atraumatic.     Right Ear: External ear normal.     Left Ear: External ear normal.  Eyes:     General: No scleral icterus.       Right eye: No discharge.        Left eye: No discharge.     Conjunctiva/sclera: Conjunctivae normal.  Neck:     Thyroid: No thyromegaly.  Cardiovascular:     Rate and Rhythm: Normal rate and regular rhythm.  Pulmonary:     Effort: No respiratory distress.     Breath sounds: Normal breath sounds. No wheezing.  Abdominal:     General: Bowel sounds are normal.     Palpations: Abdomen is soft.     Tenderness: There is no abdominal tenderness.  Musculoskeletal:        General: No swelling or tenderness.     Cervical back: Neck supple. No tenderness.  Lymphadenopathy:     Cervical: No cervical adenopathy.  Skin:    Findings: No erythema or rash.     Comments: Brusing/hematoma - posterior leg as outlined.  Resolving. No increased swelling.   Neurological:     Mental Status: She is alert.  Psychiatric:        Mood and Affect: Mood normal.        Behavior: Behavior normal.      Outpatient Encounter Medications as of 01/13/2023  Medication Sig   amLODipine (NORVASC) 5 MG tablet TAKE 1 TABLET BY MOUTH DAILY   Cyanocobalamin (  B-12) 1000 MCG TABS TAKE 1 TABLET BY MOUTH DAILY.   escitalopram (LEXAPRO) 10 MG tablet TAKE 1 TABLET BY MOUTH DAILY (FOR MOOD/DEPRESSION)   levothyroxine (SYNTHROID, LEVOTHROID) 75 MCG tablet    pantoprazole (PROTONIX) 40 MG tablet TAKE 1 TABLET BY MOUTH 30 MINUTES BEFOREEVENING MEAL   pravastatin (PRAVACHOL) 40 MG tablet TAKE ONE TABLET EACH EVENING   triamcinolone cream (KENALOG) 0.1 % Apply 1 Application topically 2 (two) times daily. Apply to affected  area on arm bid x 1 week. (Patient not taking: Reported on 01/12/2023)   [DISCONTINUED] famotidine (PEPCID) 20 MG tablet TAKE 1 TABLET BY MOUTH DAILY   No facility-administered encounter medications on file as of 01/13/2023.     Lab Results  Component Value Date   WBC 9.4 01/13/2023   HGB 14.3 01/13/2023   HCT 45.0 01/13/2023   PLT 292 01/13/2023   GLUCOSE 88 12/07/2022   CHOL 153 12/07/2022   TRIG 108.0 12/07/2022   HDL 69.70 12/07/2022   LDLCALC 62 12/07/2022   ALT 9 12/07/2022   AST 17 12/07/2022   NA 145 12/07/2022   K 4.1 12/07/2022   CL 107 12/07/2022   CREATININE 0.84 12/07/2022   BUN 13 12/07/2022   CO2 29 12/07/2022   TSH 2.32 03/22/2022   HGBA1C 5.6 12/07/2022    CT HEAD WO CONTRAST ( )  Result Date: 07/25/2022 CLINICAL DATA:  Transient ischemic attack (TIA) EXAM: CT HEAD WITHOUT CONTRAST TECHNIQUE: Contiguous axial images were obtained from the base of the skull through the vertex without intravenous contrast. RADIATION DOSE REDUCTION: This exam was performed according to the departmental dose-optimization program which includes automated exposure control, adjustment of the mA and/or kV according to patient size and/or use of iterative reconstruction technique. COMPARISON:  MRI head 02/16/2022. FINDINGS: Brain: No evidence of acute large vascular territory infarction, hemorrhage, hydrocephalus, extra-axial collection or mass lesion/mass effect. Patchy white matter hypodensities, nonspecific but compatible with chronic microvascular ischemic disease. Cerebral atrophy. Vascular: No hyperdense vessel identified. Skull: No acute fracture. Sinuses/Orbits: Clear sinuses.  No acute orbital findings. Other: No mastoid effusions. IMPRESSION: 1. No evidence of acute intracranial abnormality. 2. Chronic microvascular ischemic disease and cerebral atrophy. Electronically Signed   By: Feliberto Harts M.D.   On: 07/25/2022 10:10       Assessment & Plan:  Bruising Assessment &  Plan: Bruising/large hematoma as outlined.  No known trauma or injury.  No increased pain or swelling.  Reabsorbing.  Check cbc.   Orders: -     CBC with Differential/Platelet  Depression, major, single episode, complete remission (HCC) Assessment & Plan: On lexapro. Stable.  No SI reported. Follow.     Essential (primary) hypertension Assessment & Plan: On amlodipine.  Blood pressure as outlined.  Blood pressure doing well.  Unsure if she is taking losartan.  Per note from [facility], rx sent in.  Need to confirm if taking. Blood pressure controlled today on her current regimen as outlined.  (Just need to confirm her regimen).  She is to call back with medications. Follow pressures.  Follow metabolic panel.    History of breast cancer Assessment & Plan: S/p mastectomy.  Prescribed arimidex.  Desired not to take. Followed by oncology.     Hyperglycemia Assessment & Plan: Follow met b and a1c.    Hyperlipidemia, unspecified hyperlipidemia type Assessment & Plan: On pravastatin.  Follow lipid panel and liver function tests.     Hypothyroidism, unspecified type Assessment & Plan: On thyroid replacement.  Follow tsh.  Nontoxic multinodular goiter Assessment & Plan: Saw endocrinology.  Stable.    Thoracic aortic ectasia (HCC) Assessment & Plan: Continue blood pressure control - amlodipine.  Continue pravastatin.       Dale Ross, MD

## 2023-01-14 ENCOUNTER — Encounter: Payer: Self-pay | Admitting: Internal Medicine

## 2023-01-14 DIAGNOSIS — T148XXA Other injury of unspecified body region, initial encounter: Secondary | ICD-10-CM | POA: Insufficient documentation

## 2023-01-14 NOTE — Assessment & Plan Note (Signed)
Saw endocrinology.  Stable.  

## 2023-01-14 NOTE — Assessment & Plan Note (Signed)
On thyroid replacement.  Follow tsh.  

## 2023-01-14 NOTE — Assessment & Plan Note (Signed)
Bruising/large hematoma as outlined.  No known trauma or injury.  No increased pain or swelling.  Reabsorbing.  Check cbc.

## 2023-01-14 NOTE — Assessment & Plan Note (Signed)
On amlodipine.  Blood pressure as outlined.  Blood pressure doing well.  Unsure if she is taking losartan.  Per note from [facility], rx sent in.  Need to confirm if taking. Blood pressure controlled today on her current regimen as outlined.  (Just need to confirm her regimen).  She is to call back with medications. Follow pressures.  Follow metabolic panel.

## 2023-01-14 NOTE — Assessment & Plan Note (Signed)
On pravastatin.  Follow lipid panel and liver function tests.   

## 2023-01-14 NOTE — Assessment & Plan Note (Signed)
Follow met b and a1c.  

## 2023-01-14 NOTE — Assessment & Plan Note (Signed)
Continue blood pressure control - amlodipine.  Continue pravastatin.  

## 2023-01-14 NOTE — Assessment & Plan Note (Signed)
On lexapro. Stable.  No SI reported. Follow.

## 2023-01-14 NOTE — Assessment & Plan Note (Signed)
S/p mastectomy.  Prescribed arimidex.  Desired not to take. Followed by oncology.   

## 2023-01-16 ENCOUNTER — Other Ambulatory Visit: Payer: Self-pay

## 2023-01-16 MED ORDER — TRIAMCINOLONE ACETONIDE 0.1 % EX CREA: 1.0000 | TOPICAL_CREAM | Freq: Two times a day (BID) | CUTANEOUS | 0 refills | Status: DC

## 2023-01-16 NOTE — Telephone Encounter (Signed)
Patient call for a refill of Triamcinolone cream she uses on her arms for itchy area on her skin   Ok Triamcinolone cream 30 gm 0 [facility]

## 2023-01-17 ENCOUNTER — Other Ambulatory Visit: Payer: Self-pay | Admitting: Internal Medicine

## 2023-01-19 ENCOUNTER — Telehealth: Payer: Self-pay | Admitting: Internal Medicine

## 2023-01-19 NOTE — Telephone Encounter (Signed)
If she has been taking and was taking when she was at the appt, I recommend continuing.

## 2023-01-19 NOTE — Telephone Encounter (Signed)
I believe this is the medication that we discussed at her appointment that she should not be taking? She was supposed to let us know if she had them at home with her medicine because she was unsure. Just wanted to confirm with you

## 2023-01-19 NOTE — Telephone Encounter (Signed)
Patient called and wanted to know: she has a prescription that was filled by Dr Pamala Hurry from [facility], medication is Losartan, should she be taken it?

## 2023-01-20 NOTE — Telephone Encounter (Signed)
LMTCB

## 2023-01-24 ENCOUNTER — Encounter: Payer: Self-pay | Admitting: Dermatology

## 2023-01-25 NOTE — Telephone Encounter (Signed)
Called patient to discuss. Per patient she picked the prescription up in May with the rest of her medications but bottle is still full so she has not been taking. Patient will remain off

## 2023-01-26 NOTE — Telephone Encounter (Signed)
Pt called back and wanted to make sure she wrote down her prescriptions right

## 2023-01-30 NOTE — Telephone Encounter (Signed)
LMTCB

## 2023-02-01 NOTE — Telephone Encounter (Signed)
Reviewed all medications with patient and advised that we should discuss pill packs for her.

## 2023-02-01 NOTE — Telephone Encounter (Signed)
Pt called in stating that she would like to go over her meds with a nurse. Pt not sure what she needs to be taking?

## 2023-03-06 ENCOUNTER — Other Ambulatory Visit: Payer: Self-pay | Admitting: Internal Medicine

## 2023-03-10 ENCOUNTER — Telehealth: Payer: Self-pay | Admitting: Internal Medicine

## 2023-03-10 NOTE — Telephone Encounter (Signed)
Pt would like to be called regarding her medication 

## 2023-03-14 NOTE — Telephone Encounter (Signed)
Pt called in stating that she call before regarding her meds. Pt would like to go over all her meds with a nurse. Pt stated she does not know what meds she needs to be taking?

## 2023-03-14 NOTE — Telephone Encounter (Signed)
Called patient.Phone was busy. LM for son. I have reviewed her meds with her several times. All on med list are correct

## 2023-03-17 NOTE — Telephone Encounter (Signed)
Typed up med list for patient and sent home with daughter.

## 2023-04-04 ENCOUNTER — Other Ambulatory Visit: Payer: Self-pay | Admitting: Internal Medicine

## 2023-04-13 ENCOUNTER — Telehealth: Payer: Self-pay | Admitting: Internal Medicine

## 2023-04-13 NOTE — Telephone Encounter (Signed)
Pt called wanting to know if she should be taking heart medicine. Pt would like to be called

## 2023-04-14 NOTE — Telephone Encounter (Signed)
Will discuss her medications again at 9/5 appt.

## 2023-04-20 ENCOUNTER — Encounter: Payer: Self-pay | Admitting: Internal Medicine

## 2023-04-20 ENCOUNTER — Ambulatory Visit (INDEPENDENT_AMBULATORY_CARE_PROVIDER_SITE_OTHER): Payer: Medicare PPO | Admitting: Internal Medicine

## 2023-04-20 VITALS — BP 122/70 | HR 83 | Temp 98.4°F | Ht 62.0 in | Wt 133.0 lb

## 2023-04-20 DIAGNOSIS — Z853 Personal history of malignant neoplasm of breast: Secondary | ICD-10-CM | POA: Diagnosis not present

## 2023-04-20 DIAGNOSIS — I10 Essential (primary) hypertension: Secondary | ICD-10-CM | POA: Diagnosis not present

## 2023-04-20 DIAGNOSIS — E042 Nontoxic multinodular goiter: Secondary | ICD-10-CM | POA: Diagnosis not present

## 2023-04-20 DIAGNOSIS — E785 Hyperlipidemia, unspecified: Secondary | ICD-10-CM | POA: Diagnosis not present

## 2023-04-20 DIAGNOSIS — E039 Hypothyroidism, unspecified: Secondary | ICD-10-CM

## 2023-04-20 DIAGNOSIS — I77810 Thoracic aortic ectasia: Secondary | ICD-10-CM | POA: Diagnosis not present

## 2023-04-20 DIAGNOSIS — F325 Major depressive disorder, single episode, in full remission: Secondary | ICD-10-CM

## 2023-04-20 DIAGNOSIS — R739 Hyperglycemia, unspecified: Secondary | ICD-10-CM | POA: Diagnosis not present

## 2023-04-20 DIAGNOSIS — L84 Corns and callosities: Secondary | ICD-10-CM | POA: Diagnosis not present

## 2023-04-20 MED ORDER — MUPIROCIN 2 % EX OINT: 1.0000 | TOPICAL_OINTMENT | Freq: Two times a day (BID) | CUTANEOUS | 0 refills | Status: DC

## 2023-04-20 NOTE — Progress Notes (Signed)
Subjective:    Patient ID: Jennifer Bernard, female    DOB: 1926-11-23, 89 y.o.   MRN: [id number]  Patient here for  Chief Complaint  Patient presents with   Medication Management    HPI Here to follow up regarding depression, hypertension, hypothyroidism and hypercholesterolemia. She is accompanied by her daughter. History obtained from both of them. Had f/u with oncology - 11/2022.  Mammogram 11/2022 - ok. She gets a comprehensive health evaluation at [facility]  Per review, planning f/u echo at next appt. Reports she is doing relatively well.  No chest pain.  Breathing stable.  No cough or congestion. S/p laser surgery - gum. Eating. Reports thickened areas/calluses - left fourth toe and right second/third toe.  Request referral to podiatry.  No abdominal pain.     Past Medical History:  Diagnosis Date   Asymptomatic menopausal state 02/20/1984   Overview:  Naturally occuring and asymptomatic Vaginal atrophy present on topical estrogen Overview:  Overview:  Naturally occuring and asymptomatic Vaginal atrophy present on topical estrogen   Basal cell carcinoma of skin 02/19/2013   Overview:  Basal cell on her nasolabial fold 2014 S/p Mohs by Dr. Adriana Simas Followed by local dermatologist regularly Overview:  Overview:  Followed by Dr. Adriana Simas   Cyst of ovary 02/19/2009   Overview:  Ovarian cyst on right stable 2004-2010 Cyst ? larger on CT 2012 Stable on follow up US Followed by Dr. Leda Quail Overview:  Overview:  Ovarian cyst on right stable 2004-2010 Cyst ? larger on CT 2012 Stable on follow up US Followed by Dr. Leda Quail   Depression, major, single episode, complete remission (HCC) 07/18/2017   Diffuse cystic mastopathy of breast 02/19/2013   Overview:  Mother with breast cancer later in life Two breast biopsies both with normal pathology Annual mammograms recommended, last 2016 Followed at the [facility]  Last Assessment & Plan:  Relevant Hx: Course: Daily Update: Today's Plan:  Overview:  Overview:  Mother with breast cancer later in life Two breast biopsies both with normal pathology Annual mammograms recommended, last 201   Essential (primary) hypertension 02/19/2013   Overview:  Goal blood pressure < 135/85 On atenolol Overview:  Overview:  Goal blood pressure < 135/85 On atenolol   Family history of breast cancer 05/12/2022   Hyperlipidemia 02/19/2013   Overview:  Goal LDL < 130, last value 92 in 2017 On Pravachol Overview:  Overview:  Goal LDL < 130, last value 82 in 2012 On Pravachol   Hypothyroidism 02/28/2012   Overview:  started on T4  TFT normal 2017 Followed by Dr. Clydie Braun Overview:  Overview:  started on T4  TFT normal 2014 Followed by Dr. Clydie Braun   Impaired glucose tolerance 02/20/2003   Overview:  Overview:  Glucose goal < 100, last value 94 in 2014 HgbA1c goal < 6.0, last value 5.3 in 2014 Urine microalbumin negative in 2014 On diet and exercise   Nontoxic multinodular goiter 03/06/2003   Overview:  Asymptomatic nodular thyroid Stable [place] 2004-2014 Followed by Dr. Clydie Braun Overview:  Overview:  Asymptomatic nodular thyroid Stable [place] 2004-2014 Followed by Dr. Clydie Braun   OSA on CPAP 01/13/2015   Overview:  Sleep study sent to medical records:   Sleep efficiency 22%, latency 24 min, # awakenings 11, res desat N=10, index 17.8  Apnea 1 for 16 sec, hypopnea 16 for 18 sec, total sleep time only 90 min so index = 11 She did not sleep much and the index puts her in mild  category Repeat study a few days later when she slept 187 min her AHI = 4.6 which is normal, average O2 sat 94% I don't think    Osteoarthritis 02/20/2003   Overview:  Mild bilateral hands Right CMC Right hip Left knee s/p meniscus tear All symptomatically stable Overview:  Overview:  Mild bilateral hands Right CMC Right hip Left knee s/p meniscus tear All symptomatically stable   Osteoporosis 02/20/2003   Overview:  Bone density 2004 LS -3.32 and RH-1.62 Bone density 2008 LS -3.14 and RH -1.22 Bone density 2009  LS -3.17 and RH -1.21 Bone density 2010 LS -3.14 and RH -1.42  Bone density 2014 LS -2.7 (L3 -3.1) and RH -1.5 Bone density 2017 LS -2.3 and RH -1.4 (neck -1.7) Vitamin D level 38 in 2012 On Boniva 2004 to 2010, restarted 2015 Repeat bone density in 2020 and stop Boniva  Last Assessment & Pl   Prediabetes 02/20/2003   Overview:  Glucose goal < 100, last value 91 in 2017 HgbA1c goal < 6.0, last value 5.6 in 2017 Urine microalbumin negative in 2017 On diet and exercise   Recurrent UTI 10/19/2014   Squamous cell carcinoma of skin 03/14/2019   Left mid brow. SCCis, hypertrophic. EDC   Past Surgical History:  Procedure Laterality Date   ABDOMINAL HYSTERECTOMY  1975   MASTECTOMY Left    URETEROSCOPY     URETEROSCOPY WITH HOLMIUM LASER LITHOTRIPSY     Family History  Problem Relation Age of Onset   Breast cancer Mother        dx late 40s   Skin cancer Father        non-melanoma; head   Liver cancer Sister        or other primary: d. 66   Breast cancer Maternal Grandmother        dx late 80s   Skin cancer Daughter        non-melanoma   Social History   Socioeconomic History   Marital status: Widowed    Spouse name: Not on file   Number of children: Not on file   Years of education: Not on file   Highest education level: Not on file  Occupational History   Not on file  Tobacco Use   Smoking status: Never   Smokeless tobacco: Never  Vaping Use   Vaping status: Never Used  Substance and Sexual Activity   Alcohol use: No   Drug use: No   Sexual activity: Not Currently    Birth control/protection: Post-menopausal  Other Topics Concern   Not on file  Social History Narrative   Not on file   Social Determinants of Health   Financial Resource Strain: Low Risk  (03/29/2022)   Overall Financial Resource Strain (CARDIA)    Difficulty of Paying Living Expenses: Not hard at all  Food Insecurity: No Food Insecurity (03/29/2022)   Hunger Vital Sign    Worried About Running Out of Food  in the Last Year: Never true    Ran Out of Food in the Last Year: Never true  Transportation Needs: No Transportation Needs (03/29/2022)   PRAPARE - Administrator, Civil Service (Medical): No    Lack of Transportation (Non-Medical): No  Physical Activity: Insufficiently Active (03/29/2022)   Exercise Vital Sign    Days of Exercise per Week: 4 days    Minutes of Exercise per Session: 30 min  Stress: No Stress Concern Present (03/29/2022)   [employer] of Occupational Health -  Occupational Stress Questionnaire    Feeling of Stress : Not at all  Social Connections: Unknown (03/29/2022)   Social Connection and Isolation Panel [NHANES]    Frequency of Communication with Friends and Family: More than three times a week    Frequency of Social Gatherings with Friends and Family: More than three times a week    Attends Religious Services: Not on file    Active Member of Clubs or Organizations: Not on file    Attends Banker Meetings: Not on file    Marital Status: Widowed     Review of Systems  Constitutional:  Negative for appetite change and unexpected weight change.  HENT:  Negative for congestion and sinus pressure.   Respiratory:  Negative for cough, chest tightness and shortness of breath.   Cardiovascular:  Negative for chest pain and palpitations.       No increased swelling.   Gastrointestinal:  Negative for abdominal pain, diarrhea, nausea and vomiting.  Genitourinary:  Negative for difficulty urinating and dysuria.  Musculoskeletal:  Negative for joint swelling and myalgias.  Skin:  Negative for color change and rash.  Neurological:  Negative for dizziness and headaches.  Psychiatric/Behavioral:  Negative for agitation and dysphoric mood.        Objective:     BP 122/70   Pulse 83   Temp 98.4 F (36.9 C) (Oral)   Ht 5\' 2"  (1.575 m)   Wt 133 lb (60.3 kg)   SpO2 99%   BMI 24.33 kg/m  Wt Readings from Last 3 Encounters:  04/20/23 133 lb  (60.3 kg)  01/13/23 138 lb (62.6 kg)  09/13/22 138 lb (62.6 kg)    Physical Exam Vitals reviewed.  Constitutional:      General: She is not in acute distress.    Appearance: Normal appearance.  HENT:     Head: Normocephalic and atraumatic.     Right Ear: External ear normal.     Left Ear: External ear normal.  Eyes:     General: No scleral icterus.       Right eye: No discharge.        Left eye: No discharge.     Conjunctiva/sclera: Conjunctivae normal.  Neck:     Thyroid: No thyromegaly.  Cardiovascular:     Rate and Rhythm: Normal rate and regular rhythm.  Pulmonary:     Effort: No respiratory distress.     Breath sounds: Normal breath sounds. No wheezing.  Abdominal:     General: Bowel sounds are normal.     Palpations: Abdomen is soft.     Tenderness: There is no abdominal tenderness.  Musculoskeletal:        General: No swelling or tenderness.     Cervical back: Neck supple. No tenderness.  Lymphadenopathy:     Cervical: No cervical adenopathy.  Skin:    Findings: No erythema or rash.  Neurological:     Mental Status: She is alert.  Psychiatric:        Mood and Affect: Mood normal.        Behavior: Behavior normal.      Outpatient Encounter Medications as of 04/20/2023  Medication Sig   mupirocin ointment (BACTROBAN) 2 % Apply 1 Application topically 2 (two) times daily.   amLODipine (NORVASC) 5 MG tablet TAKE 1 TABLET BY MOUTH DAILY   Cyanocobalamin (B-12) 1000 MCG TABS TAKE 1 TABLET BY MOUTH DAILY.   escitalopram (LEXAPRO) 10 MG tablet TAKE 1 TABLET BY MOUTH DAILY (  FOR MOOD/DEPRESSION)   levothyroxine (SYNTHROID, LEVOTHROID) 75 MCG tablet    pantoprazole (PROTONIX) 40 MG tablet TAKE 1 TABLET BY MOUTH 30 MINUTES BEFOREEVENING MEAL   pravastatin (PRAVACHOL) 40 MG tablet TAKE ONE TABLET EACH EVENING   triamcinolone cream (KENALOG) 0.1 % Apply 1 Application topically 2 (two) times daily. Apply to affected area on arm bid x 1 week.   No facility-administered  encounter medications on file as of 04/20/2023.     Lab Results  Component Value Date   WBC 8.6 04/20/2023   HGB 14.0 04/20/2023   HCT 43.5 04/20/2023   PLT 306.0 04/20/2023   GLUCOSE 112 (H) 04/20/2023   CHOL 149 04/20/2023   TRIG 114.0 04/20/2023   HDL 61.90 04/20/2023   LDLCALC 65 04/20/2023   ALT 10 04/20/2023   AST 18 04/20/2023   NA 141 04/20/2023   K 4.0 04/20/2023   CL 105 04/20/2023   CREATININE 0.92 04/20/2023   BUN 13 04/20/2023   CO2 26 04/20/2023   TSH 1.94 04/20/2023   HGBA1C 5.8 04/20/2023    CT HEAD WO CONTRAST ( )  Result Date: 07/25/2022 CLINICAL DATA:  Transient ischemic attack (TIA) EXAM: CT HEAD WITHOUT CONTRAST TECHNIQUE: Contiguous axial images were obtained from the base of the skull through the vertex without intravenous contrast. RADIATION DOSE REDUCTION: This exam was performed according to the departmental dose-optimization program which includes automated exposure control, adjustment of the mA and/or kV according to patient size and/or use of iterative reconstruction technique. COMPARISON:  MRI head 02/16/2022. FINDINGS: Brain: No evidence of acute large vascular territory infarction, hemorrhage, hydrocephalus, extra-axial collection or mass lesion/mass effect. Patchy white matter hypodensities, nonspecific but compatible with chronic microvascular ischemic disease. Cerebral atrophy. Vascular: No hyperdense vessel identified. Skull: No acute fracture. Sinuses/Orbits: Clear sinuses.  No acute orbital findings. Other: No mastoid effusions. IMPRESSION: 1. No evidence of acute intracranial abnormality. 2. Chronic microvascular ischemic disease and cerebral atrophy. Electronically Signed   By: Feliberto Harts M.D.   On: 07/25/2022 10:10       Assessment & Plan:  Essential (primary) hypertension Assessment & Plan: On amlodipine.  Blood pressure as outlined. Follow pressures.  Follow metabolic panel.    Hyperglycemia Assessment & Plan: Follow met b and  a1c.   Orders: -     Hemoglobin A1c  Hyperlipidemia, unspecified hyperlipidemia type Assessment & Plan: On pravastatin.  Follow lipid panel and liver function tests.    Orders: -     Lipid panel -     Hepatic function panel -     Basic metabolic panel -     CBC with Differential/Platelet -     TSH  Depression, major, single episode, complete remission [facility]) Assessment & Plan: On lexapro. Stable.  Denies SI. Assures me she would not hurt herself.  Follow.     History of breast cancer Assessment & Plan: S/p mastectomy.  Prescribed arimidex.  Desired not to take. Followed by oncology.     Hypothyroidism, unspecified type Assessment & Plan: On thyroid replacement.  Follow tsh.    Nontoxic multinodular goiter Assessment & Plan: Saw endocrinology.  Stable.    Thoracic aortic ectasia (HCC) Assessment & Plan: Continue blood pressure control - amlodipine.  Continue pravastatin.    Callus of toe Assessment & Plan: Thickening - fourth toe and right second and third toe.  Request referral to podiatry.   Orders: -     Ambulatory referral to Podiatry  Other orders -     Mupirocin;  Apply 1 Application topically 2 (two) times daily.  Dispense: 22 g; Refill: 0     Dale [place], MD

## 2023-04-21 ENCOUNTER — Encounter: Payer: Self-pay | Admitting: Internal Medicine

## 2023-04-21 DIAGNOSIS — L84 Corns and callosities: Secondary | ICD-10-CM | POA: Insufficient documentation

## 2023-04-21 LAB — HEPATIC FUNCTION PANEL
ALT: 10 U/L (ref 0–35)
AST: 18 U/L (ref 0–37)
Albumin: 3.7 g/dL (ref 3.5–5.2)
Alkaline Phosphatase: 64 U/L (ref 39–117)
Bilirubin, Direct: 0.1 mg/dL (ref 0.0–0.3)
Total Bilirubin: 0.6 mg/dL (ref 0.2–1.2)
Total Protein: 6.9 g/dL (ref 6.0–8.3)

## 2023-04-21 LAB — BASIC METABOLIC PANEL
BUN: 13 mg/dL (ref 6–23)
CO2: 26 meq/L (ref 19–32)
Calcium: 9.3 mg/dL (ref 8.4–10.5)
Chloride: 105 meq/L (ref 96–112)
Creatinine, Ser: 0.92 mg/dL (ref 0.40–1.20)
GFR: 53.46 mL/min — ABNORMAL LOW (ref >60.00)
Glucose, Bld: 112 mg/dL — ABNORMAL HIGH (ref 70–99)
Potassium: 4 meq/L (ref 3.5–5.1)
Sodium: 141 meq/L (ref 135–145)

## 2023-04-21 LAB — LIPID PANEL
Cholesterol: 149 mg/dL (ref 0–200)
HDL: 61.9 mg/dL (ref >39.00)
LDL Cholesterol: 65 mg/dL (ref 0–99)
NonHDL: 87.3
Total CHOL/HDL Ratio: 2
Triglycerides: 114 mg/dL (ref 0.0–149.0)
VLDL: 22.8 mg/dL (ref 0.0–40.0)

## 2023-04-21 LAB — CBC WITH DIFFERENTIAL/PLATELET
Basophils Absolute: 0.1 10*3/uL (ref 0.0–0.1)
Basophils Relative: 0.8 % (ref 0.0–3.0)
Eosinophils Absolute: 0.1 10*3/uL (ref 0.0–0.7)
Eosinophils Relative: 1.6 % (ref 0.0–5.0)
HCT: 43.5 % (ref 36.0–46.0)
Hemoglobin: 14 g/dL (ref 12.0–15.0)
Lymphocytes Relative: 16 % (ref 12.0–46.0)
Lymphs Abs: 1.4 10*3/uL (ref 0.7–4.0)
MCHC: 32.2 g/dL (ref 30.0–36.0)
MCV: 88.5 fl (ref 78.0–100.0)
Monocytes Absolute: 1.1 10*3/uL — ABNORMAL HIGH (ref 0.1–1.0)
Monocytes Relative: 12.2 % — ABNORMAL HIGH (ref 3.0–12.0)
Neutro Abs: 6 10*3/uL (ref 1.4–7.7)
Neutrophils Relative %: 69.4 % (ref 43.0–77.0)
Platelets: 306 10*3/uL (ref 150.0–400.0)
RBC: 4.91 Mil/uL (ref 3.87–5.11)
RDW: 14.7 % (ref 11.5–15.5)
WBC: 8.6 10*3/uL (ref 4.0–10.5)

## 2023-04-21 LAB — HEMOGLOBIN A1C: Hgb A1c MFr Bld: 5.8 % (ref 4.6–6.5)

## 2023-04-21 LAB — TSH: TSH: 1.94 u[IU]/mL (ref 0.35–5.50)

## 2023-04-21 NOTE — Assessment & Plan Note (Signed)
Follow met b and a1c.  

## 2023-04-21 NOTE — Assessment & Plan Note (Signed)
On pravastatin.  Follow lipid panel and liver function tests.   

## 2023-04-21 NOTE — Assessment & Plan Note (Signed)
On thyroid replacement.  Follow tsh.  

## 2023-04-21 NOTE — Assessment & Plan Note (Signed)
On amlodipine.  Blood pressure as outlined.  Follow pressures.  Follow metabolic panel.  

## 2023-04-21 NOTE — Assessment & Plan Note (Signed)
S/p mastectomy.  Prescribed arimidex.  Desired not to take. Followed by oncology.

## 2023-04-21 NOTE — Assessment & Plan Note (Signed)
Thickening - fourth toe and right second and third toe.  Request referral to podiatry.

## 2023-04-21 NOTE — Assessment & Plan Note (Signed)
Continue blood pressure control - amlodipine.  Continue pravastatin.  

## 2023-04-21 NOTE — Assessment & Plan Note (Signed)
On lexapro. Stable.  Denies SI. Assures me she would not hurt herself.  Follow.

## 2023-04-21 NOTE — Assessment & Plan Note (Signed)
Saw endocrinology.  Stable.  

## 2023-05-03 ENCOUNTER — Encounter: Payer: Self-pay | Admitting: Dermatology

## 2023-05-03 ENCOUNTER — Ambulatory Visit (INDEPENDENT_AMBULATORY_CARE_PROVIDER_SITE_OTHER): Payer: Medicare PPO | Admitting: Dermatology

## 2023-05-03 VITALS — BP 119/69 | HR 78

## 2023-05-03 DIAGNOSIS — L814 Other melanin hyperpigmentation: Secondary | ICD-10-CM | POA: Diagnosis not present

## 2023-05-03 DIAGNOSIS — L820 Inflamed seborrheic keratosis: Secondary | ICD-10-CM

## 2023-05-03 DIAGNOSIS — W908XXA Exposure to other nonionizing radiation, initial encounter: Secondary | ICD-10-CM | POA: Diagnosis not present

## 2023-05-03 DIAGNOSIS — Z1283 Encounter for screening for malignant neoplasm of skin: Secondary | ICD-10-CM

## 2023-05-03 DIAGNOSIS — Z86007 Personal history of in-situ neoplasm of skin: Secondary | ICD-10-CM

## 2023-05-03 DIAGNOSIS — Z7189 Other specified counseling: Secondary | ICD-10-CM

## 2023-05-03 DIAGNOSIS — L578 Other skin changes due to chronic exposure to nonionizing radiation: Secondary | ICD-10-CM

## 2023-05-03 DIAGNOSIS — D229 Melanocytic nevi, unspecified: Secondary | ICD-10-CM

## 2023-05-03 DIAGNOSIS — L988 Other specified disorders of the skin and subcutaneous tissue: Secondary | ICD-10-CM

## 2023-05-03 DIAGNOSIS — Z85828 Personal history of other malignant neoplasm of skin: Secondary | ICD-10-CM | POA: Diagnosis not present

## 2023-05-03 DIAGNOSIS — L821 Other seborrheic keratosis: Secondary | ICD-10-CM

## 2023-05-03 DIAGNOSIS — Z8589 Personal history of malignant neoplasm of other organs and systems: Secondary | ICD-10-CM

## 2023-05-03 DIAGNOSIS — L84 Corns and callosities: Secondary | ICD-10-CM | POA: Diagnosis not present

## 2023-05-03 DIAGNOSIS — D1801 Hemangioma of skin and subcutaneous tissue: Secondary | ICD-10-CM | POA: Diagnosis not present

## 2023-05-03 NOTE — Patient Instructions (Addendum)
Cryotherapy Aftercare  Wash gently with soap and water everyday.   Apply Vaseline Jelly daily until healed.     Call if would like referral to podiatry   Recommend daily broad spectrum sunscreen SPF 30+ to sun-exposed areas, reapply every 2 hours as needed. Call for new or changing lesions.  Staying in the shade or wearing long sleeves, sun glasses (UVA+UVB protection) and wide brim hats (4-inch brim around the entire circumference of the hat) are also recommended for sun protection.    Melanoma ABCDEs  Melanoma is the most dangerous type of skin cancer, and is the leading cause of death from skin disease.  You are more likely to develop melanoma if you: Have light-colored skin, light-colored eyes, or red or blond hair Spend a lot of time in the sun Tan regularly, either outdoors or in a tanning bed Have had blistering sunburns, especially during childhood Have a close family member who has had a melanoma Have atypical moles or large birthmarks  Early detection of melanoma is key since treatment is typically straightforward and cure rates are extremely high if we catch it early.   The first sign of melanoma is often a change in a mole or a new dark spot.  The ABCDE system is a way of remembering the signs of melanoma.  A for asymmetry:  The two halves do not match. B for border:  The edges of the growth are irregular. C for color:  A mixture of colors are present instead of an even brown color. D for diameter:  Melanomas are usually (but not always) greater than 6mm - the size of a pencil eraser. E for evolution:  The spot keeps changing in size, shape, and color.  Please check your skin once per month between visits. You can use a small mirror in front and a large mirror behind you to keep an eye on the back side or your body.   If you see any new or changing lesions before your next follow-up, please call to schedule a visit.  Please continue daily skin protection including  broad spectrum sunscreen SPF 30+ to sun-exposed areas, reapplying every 2 hours as needed when you're outdoors.   Staying in the shade or wearing long sleeves, sun glasses (UVA+UVB protection) and wide brim hats (4-inch brim around the entire circumference of the hat) are also recommended for sun protection.    Due to recent changes in healthcare laws, you may see results of your pathology and/or laboratory studies on MyChart before the doctors have had a chance to review them. We understand that in some cases there may be results that are confusing or concerning to you. Please understand that not all results are received at the same time and often the doctors may need to interpret multiple results in order to provide you with the best plan of care or course of treatment. Therefore, we ask that you please give [place] 2 business days to thoroughly review all your results before contacting the office for clarification. Should we see a critical lab result, you will be contacted sooner.   If You Need Anything After Your Visit  If you have any questions or concerns for your doctor, please call our main line at [phone] and press option 4 to reach your doctor's medical assistant. If no one answers, please leave a voicemail as directed and we will return your call as soon as possible. Messages left after 4 pm will be answered the following business day.  You may also send [place] a message via MyChart. We typically respond to MyChart messages within 1-2 business days.  For prescription refills, please ask your pharmacy to contact our office. Our fax number is [phone].  If you have an urgent issue when the clinic is closed that cannot wait until the next business day, you can page your doctor at the number below.    Please note that while we do our best to be available for urgent issues outside of office hours, we are not available 24/7.   If you have an urgent issue and are unable to reach [place], you may  choose to seek medical care at your doctor's office, retail clinic, urgent care center, or emergency room.  If you have a medical emergency, please immediately call 911 or go to the emergency department.  Pager Numbers  - Dr. Gwen Pounds: [phone]  - Dr. Roseanne Reno: [phone]  - Dr. Katrinka Blazing: [phone]   In the event of inclement weather, please call our main line at [phone] for an update on the status of any delays or closures.  Dermatology Medication Tips: Please keep the boxes that topical medications come in in order to help keep track of the instructions about where and how to use these. Pharmacies typically print the medication instructions only on the boxes and not directly on the medication tubes.   If your medication is too expensive, please contact our office at [phone] option 4 or send [place] a message through MyChart.   We are unable to tell what your co-pay for medications will be in advance as this is different depending on your insurance coverage. However, we may be able to find a substitute medication at lower cost or fill out paperwork to get insurance to cover a needed medication.   If a prior authorization is required to get your medication covered by your insurance company, please allow [place] 1-2 business days to complete this process.  Drug prices often vary depending on where the prescription is filled and some pharmacies may offer cheaper prices.  The website www.goodrx.com contains coupons for medications through different pharmacies. The prices here do not account for what the cost may be with help from insurance (it may be cheaper with your insurance), but the website can give you the price if you did not use any insurance.  - You can print the associated coupon and take it with your prescription to the pharmacy.  - You may also stop by our office during regular business hours and pick up a GoodRx coupon card.  - If you need your prescription sent  electronically to a different pharmacy, notify our office through [facility] or by phone at [phone] option 4.     Si Usted Necesita Algo Despus de Su Visita  Tambin puede enviarnos un mensaje a travs de Clinical cytogeneticist. Por lo general respondemos a los mensajes de MyChart en el transcurso de 1 a 2 das hbiles.  Para renovar recetas, por favor pida a su farmacia que se ponga en contacto con nuestra oficina. Annie Sable de fax es [place] [phone].  Si tiene un asunto urgente cuando la clnica est cerrada y que no puede esperar hasta el siguiente da hbil, puede llamar/localizar a su doctor(a) al nmero que aparece a continuacin.   Por favor, tenga en cuenta que aunque hacemos todo lo posible para estar disponibles para asuntos urgentes fuera del horario de [place], no estamos disponibles las 24 horas del da, los 7 [address]  [address] de la [place].  Si tiene un problema urgente y no puede comunicarse con nosotros, puede optar por buscar atencin mdica  en el consultorio de su doctor(a), en una clnica privada, en un centro de atencin urgente o en una sala de emergencias.  Si tiene Engineer, drilling, por favor llame inmediatamente al 911 o vaya a la sala de emergencias.  Nmeros de bper  - Dr. Gwen Pounds: [phone]  - Dra. Roseanne Reno: [phone]  - Dr. Katrinka Blazing: [phone]   En caso de inclemencias del tiempo, por favor llame a Lacy Duverney principal al [phone] para una actualizacin sobre el [place] de cualquier retraso o cierre.  Consejos para la medicacin en dermatologa: Por favor, guarde las cajas en las que vienen los medicamentos de uso tpico para ayudarle a seguir las instrucciones sobre dnde y cmo usarlos. Las farmacias generalmente imprimen las instrucciones del medicamento slo en las cajas y no directamente en los tubos del [place].   Si su medicamento es muy caro, por favor, pngase en contacto con Rolm Gala llamando al [phone] y presione la  opcin 4 o envenos un mensaje a travs de Clinical cytogeneticist.   No podemos decirle cul ser su copago por los medicamentos por adelantado ya que esto es diferente dependiendo de la cobertura de su seguro. Sin embargo, es posible que podamos encontrar un medicamento sustituto a Audiological scientist un formulario para que el seguro cubra el medicamento que se considera necesario.   Si se requiere una autorizacin previa para que su compaa de seguros [place] su medicamento, por favor permtanos de 1 a 2 das hbiles para completar [address]  Los precios de los medicamentos varan con frecuencia dependiendo del Environmental consultant de dnde se surte la receta y alguna farmacias pueden ofrecer precios ms baratos.  El sitio web www.goodrx.com tiene cupones para medicamentos de Health and safety inspector. Los precios aqu no tienen en cuenta lo que podra costar con la ayuda del seguro (puede ser ms barato con su seguro), pero el sitio web puede darle el precio si no utiliz Tourist information centre manager.  - Puede imprimir el cupn correspondiente y llevarlo con su receta a la farmacia.  - Tambin puede pasar por nuestra oficina durante el horario de atencin regular y Education officer, museum una tarjeta de cupones de GoodRx.  - Si necesita que su receta se enve electrnicamente a una farmacia diferente, informe a nuestra oficina a travs de MyChart de  o por telfono llamando al [phone] y presione la opcin 4.

## 2023-05-03 NOTE — Progress Notes (Signed)
Follow-Up Visit   Subjective  Jennifer Bernard is a 89 y.o. female who presents for the following: Skin Cancer Screening and Full Body Skin Exam. HxBCC, HxSCC  The patient presents for Total-Body Skin Exam (TBSE) for skin cancer screening and mole check. The patient has spots, moles and lesions to be evaluated, some may be new or changing and the patient may have concern these could be cancer.  Would like Botox today as well.   The following portions of the chart were reviewed this encounter and updated as appropriate: medications, allergies, medical history  Review of Systems:  No other skin or systemic complaints except as noted in HPI or Assessment and Plan.  Objective  Well appearing patient in no apparent distress; mood and affect are within normal limits.  A full examination was performed including scalp, head, eyes, ears, nose, lips, neck, chest, axillae, abdomen, back, buttocks, bilateral upper extremities, bilateral lower extremities, hands, feet, fingers, toes, fingernails, and toenails. All findings within normal limits unless otherwise noted below.   Relevant physical exam findings are noted in the Assessment and Plan.  Head - Anterior (Face) Rhytides and volume loss.      left wrist x2, inframammary x3, back x2, face x15 (22) Erythematous keratotic or waxy stuck-on papule or plaque.    Assessment & Plan   HISTORY OF BASAL CELL CARCINOMA OF THE SKIN. Nasolabial fold. Mohs 2014. - No evidence of recurrence today - Recommend regular full body skin exams - Recommend daily broad spectrum sunscreen SPF 30+ to sun-exposed areas, reapply every 2 hours as needed.  - Call if any new or changing lesions are noted between office visits  HISTORY OF SQUAMOUS CELL CARCINOMA IN SITU OF THE SKIN. Left mid brow. EDC. 03/14/2019. - No evidence of recurrence today - Recommend regular full body skin exams - Recommend daily broad spectrum sunscreen SPF 30+ to sun-exposed areas,  reapply every 2 hours as needed.  - Call if any new or changing lesions are noted between office visits   SKIN CANCER SCREENING PERFORMED TODAY.  ACTINIC DAMAGE - Chronic condition, secondary to cumulative UV/sun exposure - diffuse scaly erythematous macules with underlying dyspigmentation - Recommend daily broad spectrum sunscreen SPF 30+ to sun-exposed areas, reapply every 2 hours as needed.  - Staying in the shade or wearing long sleeves, sun glasses (UVA+UVB protection) and wide brim hats (4-inch brim around the entire circumference of the hat) are also recommended for sun protection.  - Call for new or changing lesions.  LENTIGINES, SEBORRHEIC KERATOSES, HEMANGIOMAS - Benign normal skin lesions - Benign-appearing - Call for any changes  MELANOCYTIC NEVI - Tan-brown and/or pink-flesh-colored symmetric macules and papules - Benign appearing on exam today - Observation - Call clinic for new or changing moles - Recommend daily use of broad spectrum spf 30+ sunscreen to sun-exposed areas.    Corn/Callus  L-4 dorsal toe  Recommend using OTC corn pads.  Call if would like referral to podiatry   Elastosis of skin Head - Anterior (Face)  Botox Injection - Head - Anterior (Face) Location: See attached image  Informed consent: Discussed risks (infection, pain, bleeding, bruising, swelling, allergic reaction, paralysis of nearby muscles, eyelid droop, double vision, neck weakness, difficulty breathing, headache, undesirable cosmetic result, and need for additional treatment) and benefits of the procedure, as well as the alternatives.  Informed consent was obtained.  Preparation: The area was cleansed with alcohol.  Procedure Details:  Botox was injected into the dermis with a 30-gauge  needle. Pressure applied to any bleeding. Ice packs offered for swelling.  Lot Number:  [id number] Expiration:  04/2025  Total Units Injected:  40  Plan: Patient was instructed to remain upright  for 4 hours. Patient was instructed to avoid massaging the face and avoid vigorous exercise for the rest of the day. Tylenol may be used for headache.  Allow 2 weeks before returning to clinic for additional dosing as needed. Patient will call for any problems.   Inflamed seborrheic keratosis (22) left wrist x2, inframammary x3, back x2, face x15  Symptomatic, irritating, patient would like treated.  Destruction of lesion - left wrist x2, inframammary x3, back x2, face x15 (22) Complexity: simple   Destruction method: cryotherapy   Informed consent: discussed and consent obtained   Timeout:  patient name, date of birth, surgical site, and procedure verified Lesion destroyed using liquid nitrogen: Yes   Region frozen until ice ball extended beyond lesion: Yes   Outcome: patient tolerated procedure well with no complications   Post-procedure details: wound care instructions given   Additional details:  Prior to procedure, discussed risks of blister formation, small wound, skin dyspigmentation, or rare scar following cryotherapy. Recommend Vaseline ointment to treated areas while healing.    Return in about 1 year (around 05/02/2024) for TBSE.  I, Lawson Radar, CMA, am acting as scribe for Armida Sans, MD.   Documentation: I have reviewed the above documentation for accuracy and completeness, and I agree with the above.  Armida Sans, MD

## 2023-05-05 ENCOUNTER — Encounter: Payer: Self-pay | Admitting: Podiatry

## 2023-05-05 ENCOUNTER — Encounter: Payer: Self-pay | Admitting: Dermatology

## 2023-05-05 ENCOUNTER — Ambulatory Visit: Payer: Medicare PPO | Admitting: Podiatry

## 2023-05-05 VITALS — BP 135/72 | HR 75

## 2023-05-05 DIAGNOSIS — M2042 Other hammer toe(s) (acquired), left foot: Secondary | ICD-10-CM

## 2023-05-05 NOTE — Progress Notes (Signed)
Chief Complaint  Patient presents with   Toe Pain    "I have a place on my toe that won't heal.  I want him to look at this other foot.  The toe bulges up." N - toe painful L - 4th left, 2nd right D - couple of mos. O - off and on C - corn, bulges A - shoes T - bandaids, PCP prescribed ointment    Subjective: 89 y.o. female presenting to the office today as a new patient for evaluation of a symptomatic skin lesion overlying the PIPJ of the left fourth toe.  Present for about 1 months now.  She denies any change in activity.  It is very symptomatic especially in close toed shoes.   Past Medical History:  Diagnosis Date   Asymptomatic menopausal state 02/20/1984   Overview:  Naturally occuring and asymptomatic Vaginal atrophy present on topical estrogen Overview:  Overview:  Naturally occuring and asymptomatic Vaginal atrophy present on topical estrogen   Basal cell carcinoma of skin 02/19/2013   Overview:  Basal cell on her nasolabial fold 2014 S/p Mohs by Dr. Adriana Simas Followed by local dermatologist regularly Overview:  Overview:  Followed by Dr. Adriana Simas   Cyst of ovary 02/19/2009   Overview:  Ovarian cyst on right stable 2004-2010 Cyst ? larger on CT 2012 Stable on follow up US Followed by Dr. Leda Quail Overview:  Overview:  Ovarian cyst on right stable 2004-2010 Cyst ? larger on CT 2012 Stable on follow up US Followed by Dr. Leda Quail   Depression, major, single episode, complete remission (HCC) 07/18/2017   Diffuse cystic mastopathy of breast 02/19/2013   Overview:  Mother with breast cancer later in life Two breast biopsies both with normal pathology Annual mammograms recommended, last 2016 Followed at [facility]  Last Assessment & Plan:  Relevant Hx: Course: Daily Update: Today's Plan: Overview:  Overview:  Mother with breast cancer later in life Two breast biopsies both with normal pathology Annual mammograms recommended, last 201   Essential (primary) hypertension  02/19/2013   Overview:  Goal blood pressure < 135/85 On atenolol Overview:  Overview:  Goal blood pressure < 135/85 On atenolol   Family history of breast cancer 05/12/2022   Hyperlipidemia 02/19/2013   Overview:  Goal LDL < 130, last value 92 in 2017 On Pravachol Overview:  Overview:  Goal LDL < 130, last value 82 in 2012 On Pravachol   Hypothyroidism 02/28/2012   Overview:  started on T4  TFT normal 2017 Followed by Dr. Clydie Braun Overview:  Overview:  started on T4  TFT normal 2014 Followed by Dr. Clydie Braun   Impaired glucose tolerance 02/20/2003   Overview:  Overview:  Glucose goal < 100, last value 94 in 2014 HgbA1c goal < 6.0, last value 5.3 in 2014 Urine microalbumin negative in 2014 On diet and exercise   Nontoxic multinodular goiter 03/06/2003   Overview:  Asymptomatic nodular thyroid Stable [place] 2004-2014 Followed by Dr. Clydie Braun Overview:  Overview:  Asymptomatic nodular thyroid Stable [place] 2004-2014 Followed by Dr. Clydie Braun   OSA on CPAP 01/13/2015   Overview:  Sleep study sent to medical records:   Sleep efficiency 22%, latency 24 min, # awakenings 11, res desat N=10, index 17.8  Apnea 1 for 16 sec, hypopnea 16 for 18 sec, total sleep time only 90 min so index = 11 She did not sleep much and the index puts her in mild category Repeat study a few days later when she slept  187 min her AHI = 4.6 which is normal, average O2 sat 94% I don't think    Osteoarthritis 02/20/2003   Overview:  Mild bilateral hands Right CMC Right hip Left knee s/p meniscus tear All symptomatically stable Overview:  Overview:  Mild bilateral hands Right CMC Right hip Left knee s/p meniscus tear All symptomatically stable   Osteoporosis 02/20/2003   Overview:  Bone density 2004 LS -3.32 and RH-1.62 Bone density 2008 LS -3.14 and RH -1.22 Bone density 2009 LS -3.17 and RH -1.21 Bone density 2010 LS -3.14 and RH -1.42  Bone density 2014 LS -2.7 (L3 -3.1) and RH -1.5 Bone density 2017 LS -2.3 and RH -1.4 (neck -1.7) Vitamin D level 38 in  2012 On Boniva 2004 to 2010, restarted 2015 Repeat bone density in 2020 and stop Boniva  Last Assessment & Pl   Prediabetes 02/20/2003   Overview:  Glucose goal < 100, last value 91 in 2017 HgbA1c goal < 6.0, last value 5.6 in 2017 Urine microalbumin negative in 2017 On diet and exercise   Recurrent UTI 10/19/2014   Squamous cell carcinoma of skin 03/14/2019   Left mid brow. SCCis, hypertrophic. EDC    Past Surgical History:  Procedure Laterality Date   ABDOMINAL HYSTERECTOMY  1975   MASTECTOMY Left    URETEROSCOPY     URETEROSCOPY WITH HOLMIUM LASER LITHOTRIPSY      Allergies  Allergen Reactions   Penicillins Hives    Other reaction(s): Unknown   Ceftin [Cefuroxime Axetil] Diarrhea    GI distress   Ramipril     Other reaction(s): Cough Other reaction(s): Cough      Objective:  Physical Exam General: Alert and oriented x3 in no acute distress  Dermatology: Hyperkeratotic lesion(s) present on the dorsal aspect of the PIPJ left fourth toe. Pain on palpation with a central nucleated core noted. Skin is warm, dry and supple bilateral lower extremities. Negative for open lesions or macerations.  Vascular: Palpable pedal pulses bilaterally. No edema or erythema noted. Capillary refill within normal limits.  Neurological: Grossly intact via light touch  Musculoskeletal Exam: Pain on palpation at the keratotic lesion(s) noted. Range of motion within normal limits bilateral. Muscle strength 5/5 in all groups bilateral.  Assessment: 1.  Symptomatic benign skin lesion overlying the PIPJ left fourth toe   Plan of Care:  -Patient evaluated -Excisional debridement of keratoic lesion(s) using a chisel blade was performed without incident.  - Patient felt significant relief.  Recommend shoes that do not irritate the toes -Return to the clinic PRN.   Felecia Shelling, DPM [facility]  Dr. Felecia Shelling, DPM    [address] [place], [place] [address]                Office [phone]  Fax [phone]

## 2023-05-16 ENCOUNTER — Ambulatory Visit: Payer: Medicare PPO | Admitting: Dermatology

## 2023-06-08 ENCOUNTER — Telehealth: Payer: Self-pay | Admitting: Internal Medicine

## 2023-06-08 DIAGNOSIS — R350 Frequency of micturition: Secondary | ICD-10-CM

## 2023-06-08 NOTE — Telephone Encounter (Signed)
Patient called and would like to provider about frequent urination. She states after hysterectomy  she has been getting up more frequent at night to use bathroom. She wants to know if normal. Please advise

## 2023-06-09 NOTE — Telephone Encounter (Signed)
Called patient. Phone was busy

## 2023-06-15 NOTE — Addendum Note (Signed)
Addended by: Rita Ohara D on: 06/15/2023 12:47 PM   Modules accepted: Orders

## 2023-06-15 NOTE — Telephone Encounter (Signed)
Spoke with patient. Advised that she has been complaining of this for several years. Has been over 1 year since seeing urology. New referral placed.

## 2023-06-27 ENCOUNTER — Ambulatory Visit (INDEPENDENT_AMBULATORY_CARE_PROVIDER_SITE_OTHER): Payer: Medicare PPO | Admitting: Internal Medicine

## 2023-06-27 ENCOUNTER — Ambulatory Visit (INDEPENDENT_AMBULATORY_CARE_PROVIDER_SITE_OTHER): Payer: Medicare PPO

## 2023-06-27 VITALS — BP 118/70 | HR 88 | Temp 98.6°F | Resp 16 | Ht 62.0 in | Wt 134.2 lb

## 2023-06-27 DIAGNOSIS — R350 Frequency of micturition: Secondary | ICD-10-CM | POA: Diagnosis not present

## 2023-06-27 DIAGNOSIS — I10 Essential (primary) hypertension: Secondary | ICD-10-CM

## 2023-06-27 DIAGNOSIS — G4733 Obstructive sleep apnea (adult) (pediatric): Secondary | ICD-10-CM | POA: Diagnosis not present

## 2023-06-27 DIAGNOSIS — F325 Major depressive disorder, single episode, in full remission: Secondary | ICD-10-CM

## 2023-06-27 DIAGNOSIS — E785 Hyperlipidemia, unspecified: Secondary | ICD-10-CM | POA: Diagnosis not present

## 2023-06-27 DIAGNOSIS — R109 Unspecified abdominal pain: Secondary | ICD-10-CM

## 2023-06-27 DIAGNOSIS — R739 Hyperglycemia, unspecified: Secondary | ICD-10-CM

## 2023-06-27 DIAGNOSIS — Z853 Personal history of malignant neoplasm of breast: Secondary | ICD-10-CM | POA: Diagnosis not present

## 2023-06-27 DIAGNOSIS — R319 Hematuria, unspecified: Secondary | ICD-10-CM | POA: Diagnosis not present

## 2023-06-27 DIAGNOSIS — M79672 Pain in left foot: Secondary | ICD-10-CM

## 2023-06-27 DIAGNOSIS — Z87442 Personal history of urinary calculi: Secondary | ICD-10-CM

## 2023-06-27 DIAGNOSIS — R351 Nocturia: Secondary | ICD-10-CM | POA: Diagnosis not present

## 2023-06-27 LAB — URINALYSIS, ROUTINE W REFLEX MICROSCOPIC
Bilirubin Urine: NEGATIVE
Ketones, ur: NEGATIVE
Nitrite: NEGATIVE
Specific Gravity, Urine: 1.02 (ref 1.000–1.030)
Total Protein, Urine: 100 — AB
Urine Glucose: NEGATIVE
Urobilinogen, UA: 0.2 (ref 0.0–1.0)
pH: 6.5 (ref 5.0–8.0)

## 2023-06-27 LAB — POCT URINALYSIS DIPSTICK
Glucose, UA: NEGATIVE
Nitrite, UA: NEGATIVE
Protein, UA: POSITIVE — AB
Spec Grav, UA: 1.025 (ref 1.010–1.025)
Urobilinogen, UA: 0.2 U/dL
pH, UA: 6 (ref 5.0–8.0)

## 2023-06-27 NOTE — Progress Notes (Addendum)
Subjective:    Patient ID: Jennifer Bernard, female    DOB: 1927-02-05, 89 y.o.   MRN: [id number]  Patient here for  Chief Complaint  Patient presents with   Urinary Frequency   Nocturia    HPI Work in appt - work in with concerns regarding nocturia. This has been an ongoing issue for her.  Has seen urology.  Has been given rx for trospium. Unclear if took the medication. Also recently evaluated by pulmonary for evaluation - sleep apnea.  Prescribed overnight oxygen.  Has topped wearing.  Was aggravating her. On questioning today, she reports nocturia 2x/night.  Discussed not drinking increased fluids prior to bed. She also reported right flank discomfort.  No gross hematuria.  Discussed urine dip. Increased blood. No dysuria. No vaginal symptoms or bleeding. Does report - persistent pain - left foot - arch of foot and base of fourth toe.    Past Medical History:  Diagnosis Date   Asymptomatic menopausal state 02/20/1984   Overview:  Naturally occuring and asymptomatic Vaginal atrophy present on topical estrogen Overview:  Overview:  Naturally occuring and asymptomatic Vaginal atrophy present on topical estrogen   Basal cell carcinoma of skin 02/19/2013   Overview:  Basal cell on her nasolabial fold 2014 S/p Mohs by Dr. Adriana Simas Followed by local dermatologist regularly Overview:  Overview:  Followed by Dr. Adriana Simas   Cyst of ovary 02/19/2009   Overview:  Ovarian cyst on right stable 2004-2010 Cyst ? larger on CT 2012 Stable on follow up US Followed by Dr. Leda Quail Overview:  Overview:  Ovarian cyst on right stable 2004-2010 Cyst ? larger on CT 2012 Stable on follow up US Followed by Dr. Leda Quail   Depression, major, single episode, complete remission (HCC) 07/18/2017   Diffuse cystic mastopathy of breast 02/19/2013   Overview:  Mother with breast cancer later in life Two breast biopsies both with normal pathology Annual mammograms recommended, last 2016 Follow[facility]   Last Assessment & Plan:  Relevant Hx: Course: Daily Update: Today's Plan: Overview:  Overview:  Mother with breast cancer later in life Two breast biopsies both with normal pathology Annual mammograms recommended, last 201   Essential (primary) hypertension 02/19/2013   Overview:  Goal blood pressure < 135/85 On atenolol Overview:  Overview:  Goal blood pressure < 135/85 On atenolol   Family history of breast cancer 05/12/2022   Hyperlipidemia 02/19/2013   Overview:  Goal LDL < 130, last value 92 in 2017 On Pravachol Overview:  Overview:  Goal LDL < 130, last value 82 in 2012 On Pravachol   Hypothyroidism 02/28/2012   Overview:  started on T4  TFT normal 2017 Followed by Dr. Clydie Braun Overview:  Overview:  started on T4  TFT normal 2014 Followed by Dr. Clydie Braun   Impaired glucose tolerance 02/20/2003   Overview:  Overview:  Glucose goal < 100, last value 94 in 2014 HgbA1c goal < 6.0, last value 5.3 in 2014 Urine microalbumin negative in 2014 On diet and exercise   Nontoxic multinodular goiter 03/06/2003   Overview:  Asymptomatic nodular thyroid Stable [place] 2004-2014 Followed by Dr. Clydie Braun Overview:  Overview:  Asymptomatic nodular thyroid Stable [place] 2004-2014 Followed by Dr. Clydie Braun   OSA on CPAP 01/13/2015   Overview:  Sleep study sent to medical records:   Sleep efficiency 22%, latency 24 min, # awakenings 11, res desat N=10, index 17.8  Apnea 1 for 16 sec, hypopnea 16 for 18 sec, total sleep  time only 90 min so index = 11 She did not sleep much and the index puts her in mild category Repeat study a few days later when she slept 187 min her AHI = 4.6 which is normal, average O2 sat 94% I don't think    Osteoarthritis 02/20/2003   Overview:  Mild bilateral hands Right CMC Right hip Left knee s/p meniscus tear All symptomatically stable Overview:  Overview:  Mild bilateral hands Right CMC Right hip Left knee s/p meniscus tear All symptomatically stable   Osteoporosis 02/20/2003   Overview:  Bone density 2004 LS  -3.32 and RH-1.62 Bone density 2008 LS -3.14 and RH -1.22 Bone density 2009 LS -3.17 and RH -1.21 Bone density 2010 LS -3.14 and RH -1.42  Bone density 2014 LS -2.7 (L3 -3.1) and RH -1.5 Bone density 2017 LS -2.3 and RH -1.4 (neck -1.7) Vitamin D level 38 in 2012 On Boniva 2004 to 2010, restarted 2015 Repeat bone density in 2020 and stop Boniva  Last Assessment & Pl   Prediabetes 02/20/2003   Overview:  Glucose goal < 100, last value 91 in 2017 HgbA1c goal < 6.0, last value 5.6 in 2017 Urine microalbumin negative in 2017 On diet and exercise   Recurrent UTI 10/19/2014   Squamous cell carcinoma of skin 03/14/2019   Left mid brow. SCCis, hypertrophic. EDC   Past Surgical History:  Procedure Laterality Date   ABDOMINAL HYSTERECTOMY  1975   MASTECTOMY Left    URETEROSCOPY     URETEROSCOPY WITH HOLMIUM LASER LITHOTRIPSY     Family History  Problem Relation Age of Onset   Breast cancer Mother        dx late 44s   Skin cancer Father        non-melanoma; head   Liver cancer Sister        or other primary: d. 32   Breast cancer Maternal Grandmother        dx late 80s   Skin cancer Daughter        non-melanoma   Social History   Socioeconomic History   Marital status: Widowed    Spouse name: Not on file   Number of children: Not on file   Years of education: Not on file   Highest education level: Not on file  Occupational History   Not on file  Tobacco Use   Smoking status: Never   Smokeless tobacco: Never  Vaping Use   Vaping status: Never Used  Substance and Sexual Activity   Alcohol use: Yes    Comment: twice a week, a glass of wine   Drug use: No   Sexual activity: Not Currently    Birth control/protection: Post-menopausal  Other Topics Concern   Not on file  Social History Narrative   Not on file   Social Determinants of Health   Financial Resource Strain: Low Risk  (03/29/2022)   Overall Financial Resource Strain (CARDIA)    Difficulty of Paying Living Expenses: Not  hard at all  Food Insecurity: No Food Insecurity (03/29/2022)   Hunger Vital Sign    Worried About Running Out of Food in the Last Year: Never true    Ran Out of Food in the Last Year: Never true  Transportation Needs: No Transportation Needs (03/29/2022)   PRAPARE - Administrator, Civil Service (Medical): No    Lack of Transportation (Non-Medical): No  Physical Activity: Insufficiently Active (03/29/2022)   Exercise Vital Sign    Days  of Exercise per Week: 4 days    Minutes of Exercise per Session: 30 min  Stress: No Stress Concern Present (03/29/2022)   [employer] of Occupational Health - Occupational Stress Questionnaire    Feeling of Stress : Not at all  Social Connections: Unknown (03/29/2022)   Social Connection and Isolation Panel [NHANES]    Frequency of Communication with Friends and Family: More than three times a week    Frequency of Social Gatherings with Friends and Family: More than three times a week    Attends Religious Services: Not on file    Active Member of Clubs or Organizations: Not on file    Attends Banker Meetings: Not on file    Marital Status: Widowed     Review of Systems  Constitutional:  Negative for appetite change and fever.  HENT:  Negative for congestion and sinus pressure.   Respiratory:  Negative for cough and chest tightness.        Breathing stable.   Cardiovascular:  Negative for chest pain and palpitations.  Gastrointestinal:  Negative for abdominal pain, nausea and vomiting.  Genitourinary:  Negative for difficulty urinating and dysuria.       Nocturia.   Musculoskeletal:  Negative for joint swelling and myalgias.  Skin:  Negative for color change and rash.  Neurological:  Negative for dizziness and headaches.  Psychiatric/Behavioral:  Negative for agitation and dysphoric mood.        Objective:     BP 118/70   Pulse 88   Temp 98.6 F (37 C)   Resp 16   Ht 5\' 2"  (1.575 m)   Wt 134 lb 3.2 oz (60.9  kg)   SpO2 97%   BMI 24.55 kg/m  Wt Readings from Last 3 Encounters:  06/27/23 134 lb 3.2 oz (60.9 kg)  04/20/23 133 lb (60.3 kg)  01/13/23 138 lb (62.6 kg)    Physical Exam Vitals reviewed.  Constitutional:      General: She is not in acute distress.    Appearance: Normal appearance.  HENT:     Head: Normocephalic and atraumatic.     Right Ear: External ear normal.     Left Ear: External ear normal.  Eyes:     General: No scleral icterus.       Right eye: No discharge.        Left eye: No discharge.     Conjunctiva/sclera: Conjunctivae normal.  Neck:     Thyroid: No thyromegaly.  Cardiovascular:     Rate and Rhythm: Normal rate and regular rhythm.  Pulmonary:     Effort: No respiratory distress.     Breath sounds: Normal breath sounds. No wheezing.  Abdominal:     General: Bowel sounds are normal.     Palpations: Abdomen is soft.     Tenderness: There is no abdominal tenderness.  Musculoskeletal:        General: No swelling or tenderness.     Cervical back: Neck supple. No tenderness.     Comments: Pain - arch - left foot.   Lymphadenopathy:     Cervical: No cervical adenopathy.  Skin:    Findings: No erythema or rash.  Neurological:     Mental Status: She is alert.  Psychiatric:        Mood and Affect: Mood normal.        Behavior: Behavior normal.      Outpatient Encounter Medications as of 06/27/2023  Medication Sig   amLODipine (  NORVASC) 5 MG tablet TAKE 1 TABLET BY MOUTH DAILY   Cyanocobalamin (B-12) 1000 MCG TABS TAKE 1 TABLET BY MOUTH DAILY.   escitalopram (LEXAPRO) 10 MG tablet TAKE 1 TABLET BY MOUTH DAILY (FOR MOOD/DEPRESSION)   levothyroxine (SYNTHROID, LEVOTHROID) 75 MCG tablet    mupirocin ointment (BACTROBAN) 2 % Apply 1 Application topically 2 (two) times daily.   pantoprazole (PROTONIX) 40 MG tablet TAKE 1 TABLET BY MOUTH 30 MINUTES BEFOREEVENING MEAL   pravastatin (PRAVACHOL) 40 MG tablet TAKE ONE TABLET EACH EVENING   triamcinolone cream  (KENALOG) 0.1 % Apply 1 Application topically 2 (two) times daily. Apply to affected area on arm bid x 1 week.   No facility-administered encounter medications on file as of 06/27/2023.     Lab Results  Component Value Date   WBC 8.6 04/20/2023   HGB 14.0 04/20/2023   HCT 43.5 04/20/2023   PLT 306.0 04/20/2023   GLUCOSE 112 (H) 04/20/2023   CHOL 149 04/20/2023   TRIG 114.0 04/20/2023   HDL 61.90 04/20/2023   LDLCALC 65 04/20/2023   ALT 10 04/20/2023   AST 18 04/20/2023   NA 141 04/20/2023   K 4.0 04/20/2023   CL 105 04/20/2023   CREATININE 0.92 04/20/2023   BUN 13 04/20/2023   CO2 26 04/20/2023   TSH 1.94 04/20/2023   HGBA1C 5.8 04/20/2023    CT HEAD WO CONTRAST ( )  Result Date: 07/25/2022 CLINICAL DATA:  Transient ischemic attack (TIA) EXAM: CT HEAD WITHOUT CONTRAST TECHNIQUE: Contiguous axial images were obtained from the base of the skull through the vertex without intravenous contrast. RADIATION DOSE REDUCTION: This exam was performed according to the departmental dose-optimization program which includes automated exposure control, adjustment of the mA and/or kV according to patient size and/or use of iterative reconstruction technique. COMPARISON:  MRI head 02/16/2022. FINDINGS: Brain: No evidence of acute large vascular territory infarction, hemorrhage, hydrocephalus, extra-axial collection or mass lesion/mass effect. Patchy white matter hypodensities, nonspecific but compatible with chronic microvascular ischemic disease. Cerebral atrophy. Vascular: No hyperdense vessel identified. Skull: No acute fracture. Sinuses/Orbits: Clear sinuses.  No acute orbital findings. Other: No mastoid effusions. IMPRESSION: 1. No evidence of acute intracranial abnormality. 2. Chronic microvascular ischemic disease and cerebral atrophy. Electronically Signed   By: Feliberto Harts M.D.   On: 07/25/2022 10:10       Assessment & Plan:  Urine frequency -     Urinalysis, Routine w reflex  microscopic -     Urine Culture -     POCT urinalysis dipstick  Hematuria, unspecified type -     DG Abd 1 View; Future  Flank pain -     DG Abd 1 View; Future  Personal history of urinary calculi Assessment & Plan: Does have a history of urinary calculi.  With right flank pain and blood noted in urine, check KUB.  Also send urine for culture.     OSA (obstructive sleep apnea) Assessment & Plan: Saw pulmonary as outlined.  Recommended O2 at night.  Not using.  Pulmonary aware.      Nocturia Assessment & Plan: Increased nocturia as outlined.  Has seen urology. Trial of sanctura.  Initially reported - not sure if helped.  Unclear if she took the medication.  Discussed sleep apnea and nocturia. Saw pulmonary.  Recommended overnight oxygen.  Urine with blood.  Right flank pain.  Check KUB.  Schedule f/u with urology. Discussed trial of sanctura.      Hyperlipidemia, unspecified hyperlipidemia type Assessment &  Plan: On pravastatin.  Follow lipid panel and liver function tests.     Hyperglycemia Assessment & Plan: Follow met b and a1c.    History of breast cancer Assessment & Plan: S/p mastectomy.  Prescribed arimidex.  Desired not to take. Followed by oncology.     Essential (primary) hypertension Assessment & Plan: On amlodipine.  Blood pressure as outlined. Follow pressures.  Follow metabolic panel.    Depression, major, single episode, complete remission [facility]) Assessment & Plan: On lexapro. Stable. Follow.     Foot pain, left Assessment & Plan: Persistent - arch of foot and base of left fourth.  Supports. F/u with Dr Logan Bores.       Dale Vilas, MD

## 2023-06-28 ENCOUNTER — Other Ambulatory Visit: Payer: Self-pay | Admitting: Internal Medicine

## 2023-06-28 DIAGNOSIS — R109 Unspecified abdominal pain: Secondary | ICD-10-CM

## 2023-06-28 DIAGNOSIS — R9389 Abnormal findings on diagnostic imaging of other specified body structures: Secondary | ICD-10-CM

## 2023-06-28 DIAGNOSIS — R319 Hematuria, unspecified: Secondary | ICD-10-CM

## 2023-06-28 LAB — URINE CULTURE
MICRO NUMBER:: 15719030
Result:: NO GROWTH
SPECIMEN QUALITY:: ADEQUATE

## 2023-06-28 NOTE — Progress Notes (Signed)
Order placed for CT renal stone study.

## 2023-07-02 ENCOUNTER — Encounter: Payer: Self-pay | Admitting: Internal Medicine

## 2023-07-02 DIAGNOSIS — M79672 Pain in left foot: Secondary | ICD-10-CM | POA: Insufficient documentation

## 2023-07-02 NOTE — Assessment & Plan Note (Signed)
Saw pulmonary as outlined.  Recommended O2 at night.  Not using.  Pulmonary aware.

## 2023-07-02 NOTE — Assessment & Plan Note (Signed)
On pravastatin.  Follow lipid panel and liver function tests.   

## 2023-07-02 NOTE — Assessment & Plan Note (Addendum)
Persistent - arch of foot and base of left fourth.  Supports. F/u with Dr Logan Bores.

## 2023-07-02 NOTE — Assessment & Plan Note (Signed)
On lexapro.  Stable.  Follow.

## 2023-07-02 NOTE — Assessment & Plan Note (Signed)
S/p mastectomy.  Prescribed arimidex.  Desired not to take. Followed by oncology.

## 2023-07-02 NOTE — Assessment & Plan Note (Signed)
Follow met b and a1c.  

## 2023-07-02 NOTE — Assessment & Plan Note (Signed)
Does have a history of urinary calculi.  With right flank pain and blood noted in urine, check KUB.  Also send urine for culture.

## 2023-07-02 NOTE — Assessment & Plan Note (Signed)
On amlodipine.  Blood pressure as outlined.  Follow pressures.  Follow metabolic panel.  

## 2023-07-02 NOTE — Assessment & Plan Note (Signed)
Increased nocturia as outlined.  Has seen urology. Trial of sanctura.  Initially reported - not sure if helped.  Unclear if she took the medication.  Discussed sleep apnea and nocturia. Saw pulmonary.  Recommended overnight oxygen.  Urine with blood.  Right flank pain.  Check KUB.  Schedule f/u with urology. Discussed trial of sanctura.

## 2023-07-03 ENCOUNTER — Telehealth: Payer: Self-pay | Admitting: Internal Medicine

## 2023-07-03 NOTE — Telephone Encounter (Signed)
Lft pt vm to call ofc to sch CT. thanks 

## 2023-07-03 NOTE — Telephone Encounter (Signed)
Patient states she is returning our call.  I read message from Tori Milks and transferred call to Rasheedah.

## 2023-07-05 ENCOUNTER — Ambulatory Visit
Admission: RE | Admit: 2023-07-05 | Discharge: 2023-07-05 | Disposition: A | Discharge status: Home / Self Care | Payer: Medicare PPO | Source: Ambulatory Visit | Attending: Internal Medicine | Admitting: Internal Medicine

## 2023-07-05 DIAGNOSIS — R9389 Abnormal findings on diagnostic imaging of other specified body structures: Secondary | ICD-10-CM | POA: Insufficient documentation

## 2023-07-05 DIAGNOSIS — K8020 Calculus of gallbladder without cholecystitis without obstruction: Secondary | ICD-10-CM | POA: Diagnosis not present

## 2023-07-05 DIAGNOSIS — K5730 Diverticulosis of large intestine without perforation or abscess without bleeding: Secondary | ICD-10-CM | POA: Diagnosis not present

## 2023-07-05 DIAGNOSIS — N132 Hydronephrosis with renal and ureteral calculous obstruction: Secondary | ICD-10-CM | POA: Diagnosis not present

## 2023-07-05 DIAGNOSIS — R319 Hematuria, unspecified: Secondary | ICD-10-CM | POA: Insufficient documentation

## 2023-07-05 DIAGNOSIS — R160 Hepatomegaly, not elsewhere classified: Secondary | ICD-10-CM | POA: Diagnosis not present

## 2023-07-12 ENCOUNTER — Telehealth: Payer: Self-pay | Admitting: *Deleted

## 2023-07-12 NOTE — Telephone Encounter (Signed)
Patient says Dr. Pamala Hurry at [facility] prescribed Losartan 25 mg back in Feb, patient has never taken it was filled by pharmacy this week and she wanted to know should she be taking this medication I advised to have nurse at [facility] check BP but to hold Losartan until I could consult with provider in office last BP in office 118/70 pulse 88.Per Dr. Marina Goodell phone note patient was to remain off if she has not been taking. Phone note dated 01/19/23 due to BP was good in office. Okay to advise patient to hold has Follow up 07/19/23 with PCP.

## 2023-07-17 NOTE — Telephone Encounter (Signed)
Patient was made aware on 07/12/23 late documentation and voiced understanding to not take Losartan.

## 2023-07-19 ENCOUNTER — Ambulatory Visit: Payer: Medicare PPO | Admitting: Internal Medicine

## 2023-07-19 ENCOUNTER — Encounter: Payer: Self-pay | Admitting: Internal Medicine

## 2023-07-19 VITALS — BP 124/70 | HR 75 | Temp 98.0°F | Resp 16 | Ht 62.0 in | Wt 133.0 lb

## 2023-07-19 DIAGNOSIS — I10 Essential (primary) hypertension: Secondary | ICD-10-CM | POA: Diagnosis not present

## 2023-07-19 DIAGNOSIS — F325 Major depressive disorder, single episode, in full remission: Secondary | ICD-10-CM

## 2023-07-19 DIAGNOSIS — Z853 Personal history of malignant neoplasm of breast: Secondary | ICD-10-CM

## 2023-07-19 DIAGNOSIS — E039 Hypothyroidism, unspecified: Secondary | ICD-10-CM

## 2023-07-19 DIAGNOSIS — R739 Hyperglycemia, unspecified: Secondary | ICD-10-CM

## 2023-07-19 DIAGNOSIS — Z87442 Personal history of urinary calculi: Secondary | ICD-10-CM | POA: Diagnosis not present

## 2023-07-19 DIAGNOSIS — R109 Unspecified abdominal pain: Secondary | ICD-10-CM | POA: Insufficient documentation

## 2023-07-19 DIAGNOSIS — I77810 Thoracic aortic ectasia: Secondary | ICD-10-CM | POA: Diagnosis not present

## 2023-07-19 DIAGNOSIS — E785 Hyperlipidemia, unspecified: Secondary | ICD-10-CM | POA: Diagnosis not present

## 2023-07-19 DIAGNOSIS — R1011 Right upper quadrant pain: Secondary | ICD-10-CM

## 2023-07-19 NOTE — Progress Notes (Signed)
Subjective:    Patient ID: Cori Razor, female    DOB: 07/06/1927, 89 y.o.   MRN: [id number]  Patient here for  Chief Complaint  Patient presents with   Medical Management of Chronic Issues    Discuss CT scan results.    HPI Here for a scheduled follow up. Recently evaluated for nocturia and right flank pain. Noted hematuria.  CT ordered. CT revealed - Cholelithiasis. Irregular asymmetric wall thickening within the posterior and fundal wall of the gallbladder concerning for gallbladder cancer. Irregular solid-appearing low-density masses within the liver concerning for metastases. 1.3 cm left renal pelvic stone with mild left hydronephrosis and perinephric stranding. Ureter is difficult to follow. No visible definitive ureteral stones. Sigmoid diverticulosis. She reports today that she is feeling ok. Eating. No nausea or vomiting. Still with some right upper quadrant/upper abdominal discomfort. No severe pain. Bowels moving. Discussed CT results and further w/up.    Past Medical History:  Diagnosis Date   Asymptomatic menopausal state 02/20/1984   Overview:  Naturally occuring and asymptomatic Vaginal atrophy present on topical estrogen Overview:  Overview:  Naturally occuring and asymptomatic Vaginal atrophy present on topical estrogen   Basal cell carcinoma of skin 02/19/2013   Overview:  Basal cell on her nasolabial fold 2014 S/p Mohs by Dr. Adriana Simas Followed by local dermatologist regularly Overview:  Overview:  Followed by Dr. Adriana Simas   Cyst of ovary 02/19/2009   Overview:  Ovarian cyst on right stable 2004-2010 Cyst ? larger on CT 2012 Stable on follow up US Followed by Dr. Leda Quail Overview:  Overview:  Ovarian cyst on right stable 2004-2010 Cyst ? larger on CT 2012 Stable on follow up US Followed by Dr. Leda Quail   Depression, major, single episode, complete remission (HCC) 07/18/2017   Diffuse cystic mastopathy of breast 02/19/2013   Overview:  Mother with breast cancer later in  life Two breast biopsies both with normal pathology Annual mammograms recommended, last 2016 Followed at the [facility]  Last Assessment & Plan:  Relevant Hx: Course: Daily Update: Today's Plan: Overview:  Overview:  Mother with breast cancer later in life Two breast biopsies both with normal pathology Annual mammograms recommended, last 201   Essential (primary) hypertension 02/19/2013   Overview:  Goal blood pressure < 135/85 On atenolol Overview:  Overview:  Goal blood pressure < 135/85 On atenolol   Family history of breast cancer 05/12/2022   Hyperlipidemia 02/19/2013   Overview:  Goal LDL < 130, last value 92 in 2017 On Pravachol Overview:  Overview:  Goal LDL < 130, last value 82 in 2012 On Pravachol   Hypothyroidism 02/28/2012   Overview:  started on T4  TFT normal 2017 Followed by Dr. Clydie Braun Overview:  Overview:  started on T4  TFT normal 2014 Followed by Dr. Clydie Braun   Impaired glucose tolerance 02/20/2003   Overview:  Overview:  Glucose goal < 100, last value 94 in 2014 HgbA1c goal < 6.0, last value 5.3 in 2014 Urine microalbumin negative in 2014 On diet and exercise   Nontoxic multinodular goiter 03/06/2003   Overview:  Asymptomatic nodular thyroid Stable [place] 2004-2014 Followed by Dr. Clydie Braun Overview:  Overview:  Asymptomatic nodular thyroid Stable [place] 2004-2014 Followed by Dr. Clydie Braun   OSA on CPAP 01/13/2015   Overview:  Sleep study sent to medical records:   Sleep efficiency 22%, latency 24 min, # awakenings 11, res desat N=10, index 17.8  Apnea 1 for 16 sec, hypopnea 16 for 18 sec,  total sleep time only 90 min so index = 11 She did not sleep much and the index puts her in mild category Repeat study a few days later when she slept 187 min her AHI = 4.6 which is normal, average O2 sat 94% I don't think    Osteoarthritis 02/20/2003   Overview:  Mild bilateral hands Right CMC Right hip Left knee s/p meniscus tear All symptomatically stable Overview:  Overview:  Mild bilateral hands Right  CMC Right hip Left knee s/p meniscus tear All symptomatically stable   Osteoporosis 02/20/2003   Overview:  Bone density 2004 LS -3.32 and RH-1.62 Bone density 2008 LS -3.14 and RH -1.22 Bone density 2009 LS -3.17 and RH -1.21 Bone density 2010 LS -3.14 and RH -1.42  Bone density 2014 LS -2.7 (L3 -3.1) and RH -1.5 Bone density 2017 LS -2.3 and RH -1.4 (neck -1.7) Vitamin D level 38 in 2012 On Boniva 2004 to 2010, restarted 2015 Repeat bone density in 2020 and stop Boniva  Last Assessment & Pl   Prediabetes 02/20/2003   Overview:  Glucose goal < 100, last value 91 in 2017 HgbA1c goal < 6.0, last value 5.6 in 2017 Urine microalbumin negative in 2017 On diet and exercise   Recurrent UTI 10/19/2014   Squamous cell carcinoma of skin 03/14/2019   Left mid brow. SCCis, hypertrophic. EDC   Past Surgical History:  Procedure Laterality Date   ABDOMINAL HYSTERECTOMY  1975   MASTECTOMY Left    URETEROSCOPY     URETEROSCOPY WITH HOLMIUM LASER LITHOTRIPSY     Family History  Problem Relation Age of Onset   Breast cancer Mother        dx late 52s   Skin cancer Father        non-melanoma; head   Liver cancer Sister        or other primary: d. 49   Breast cancer Maternal Grandmother        dx late 80s   Skin cancer Daughter        non-melanoma   Social History   Socioeconomic History   Marital status: Widowed    Spouse name: Not on file   Number of children: Not on file   Years of education: Not on file   Highest education level: Not on file  Occupational History   Not on file  Tobacco Use   Smoking status: Never   Smokeless tobacco: Never  Vaping Use   Vaping status: Never Used  Substance and Sexual Activity   Alcohol use: Yes    Comment: twice a week, a glass of wine   Drug use: No   Sexual activity: Not Currently    Birth control/protection: Post-menopausal  Other Topics Concern   Not on file  Social History Narrative   Not on file   Social Determinants of Health   Financial  Resource Strain: Low Risk  (03/29/2022)   Overall Financial Resource Strain (CARDIA)    Difficulty of Paying Living Expenses: Not hard at all  Food Insecurity: No Food Insecurity (03/29/2022)   Hunger Vital Sign    Worried About Running Out of Food in the Last Year: Never true    Ran Out of Food in the Last Year: Never true  Transportation Needs: No Transportation Needs (03/29/2022)   PRAPARE - Administrator, Civil Service (Medical): No    Lack of Transportation (Non-Medical): No  Physical Activity: Insufficiently Active (03/29/2022)   Exercise Vital Sign  Days of Exercise per Week: 4 days    Minutes of Exercise per Session: 30 min  Stress: No Stress Concern Present (03/29/2022)   [employer] of Occupational Health - Occupational Stress Questionnaire    Feeling of Stress : Not at all  Social Connections: Unknown (03/29/2022)   Social Connection and Isolation Panel [NHANES]    Frequency of Communication with Friends and Family: More than three times a week    Frequency of Social Gatherings with Friends and Family: More than three times a week    Attends Religious Services: Not on file    Active Member of Clubs or Organizations: Not on file    Attends Banker Meetings: Not on file    Marital Status: Widowed     Review of Systems  Constitutional:  Negative for appetite change and unexpected weight change.  HENT:  Negative for congestion and sinus pressure.   Respiratory:  Negative for cough, chest tightness and shortness of breath.   Cardiovascular:  Negative for chest pain and palpitations.  Gastrointestinal:  Negative for diarrhea, nausea and vomiting.       Upper abdominal discomfort as outlined.   Genitourinary:  Negative for difficulty urinating and dysuria.  Musculoskeletal:  Negative for joint swelling and myalgias.  Skin:  Negative for color change and rash.  Neurological:  Negative for dizziness and headaches.  Psychiatric/Behavioral:  Negative  for agitation and dysphoric mood.        Objective:     BP 124/70   Pulse 75   Temp 98 F (36.7 C)   Resp 16   Ht 5\' 2"  (1.575 m)   Wt 133 lb (60.3 kg)   SpO2 98%   BMI 24.33 kg/m  Wt Readings from Last 3 Encounters:  07/19/23 133 lb (60.3 kg)  06/27/23 134 lb 3.2 oz (60.9 kg)  04/20/23 133 lb (60.3 kg)    Physical Exam Vitals reviewed.  Constitutional:      General: She is not in acute distress.    Appearance: Normal appearance.  HENT:     Head: Normocephalic and atraumatic.     Right Ear: External ear normal.     Left Ear: External ear normal.     Mouth/Throat:     Pharynx: No oropharyngeal exudate or posterior oropharyngeal erythema.  Eyes:     General: No scleral icterus.       Right eye: No discharge.        Left eye: No discharge.     Conjunctiva/sclera: Conjunctivae normal.  Neck:     Thyroid: No thyromegaly.  Cardiovascular:     Rate and Rhythm: Normal rate and regular rhythm.  Pulmonary:     Effort: No respiratory distress.     Breath sounds: Normal breath sounds. No wheezing.  Abdominal:     General: Bowel sounds are normal.     Palpations: Abdomen is soft.     Tenderness: There is no abdominal tenderness.  Musculoskeletal:        General: No swelling or tenderness.     Cervical back: Neck supple. No tenderness.  Lymphadenopathy:     Cervical: No cervical adenopathy.  Skin:    Findings: No erythema or rash.  Neurological:     Mental Status: She is alert.  Psychiatric:        Mood and Affect: Mood normal.        Behavior: Behavior normal.      Outpatient Encounter Medications as of 07/19/2023  Medication Sig  amLODipine (NORVASC) 5 MG tablet TAKE 1 TABLET BY MOUTH DAILY   Cyanocobalamin (B-12) 1000 MCG TABS TAKE 1 TABLET BY MOUTH DAILY.   escitalopram (LEXAPRO) 10 MG tablet TAKE 1 TABLET BY MOUTH DAILY (FOR MOOD/DEPRESSION)   levothyroxine (SYNTHROID, LEVOTHROID) 75 MCG tablet    mupirocin ointment (BACTROBAN) 2 % Apply 1 Application  topically 2 (two) times daily.   pantoprazole (PROTONIX) 40 MG tablet TAKE 1 TABLET BY MOUTH 30 MINUTES BEFOREEVENING MEAL   pravastatin (PRAVACHOL) 40 MG tablet TAKE ONE TABLET EACH EVENING   triamcinolone cream (KENALOG) 0.1 % Apply 1 Application topically 2 (two) times daily. Apply to affected area on arm bid x 1 week.   No facility-administered encounter medications on file as of 07/19/2023.     Lab Results  Component Value Date   WBC 10.3 07/19/2023   HGB 12.7 07/19/2023   HCT 39.3 07/19/2023   PLT 353.0 07/19/2023   GLUCOSE 89 07/19/2023   CHOL 149 04/20/2023   TRIG 114.0 04/20/2023   HDL 61.90 04/20/2023   LDLCALC 65 04/20/2023   ALT 9 07/19/2023   AST 19 07/19/2023   NA 140 07/19/2023   K 4.4 07/19/2023   CL 104 07/19/2023   CREATININE 0.70 07/19/2023   BUN 16 07/19/2023   CO2 27 07/19/2023   TSH 1.94 04/20/2023   HGBA1C 5.8 04/20/2023    CT RENAL STONE STUDY  Result Date: 07/18/2023 CLINICAL DATA:  Right flank pain EXAM: CT ABDOMEN AND PELVIS WITHOUT CONTRAST TECHNIQUE: Multidetector CT imaging of the abdomen and pelvis was performed following the standard protocol without IV contrast. RADIATION DOSE REDUCTION: This exam was performed according to the departmental dose-optimization program which includes automated exposure control, adjustment of the mA and/or kV according to patient size and/or use of iterative reconstruction technique. COMPARISON:  08/11/2011.  Plain film 06/27/2023 FINDINGS: Lower chest: No acute abnormality. Aortic atherosclerosis. Small hiatal hernia. Hepatobiliary: Several small scattered cysts in the liver. In addition, there are low-density areas within the liver which appear solid and concerning for possible metastases. One such mass measures 5.3 x 4.3 cm in the medial segment of the left hepatic lobe. Other similar solid-appearing lesions are seen in the right hepatic lobe measuring up to 2.6 cm. Gallstones noted within the gallbladder. In addition,  there is abnormal irregular asymmetric wall thickening within the gallbladder measuring up to 2.4 cm in thickness. Given the asymmetry and irregularity, this is concerning for possible gallbladder cancer. Pancreas: No focal abnormality or ductal dilatation. Spleen: No focal abnormality.  Normal size. Adrenals/Urinary Tract: Adrenal glands unremarkable. 1.3 cm left renal pelvic stone. There is mild left hydronephrosis and perinephric stranding. Ureters difficult to follow but appear no hydronephrosis on the right. Bilateral low-density lesions within the kidneys measuring up to 3 cm on the right and 1.7 cm on the left most likely cysts. Decompressed. Stomach/Bowel: Sigmoid diverticulosis. No active diverticulitis. Stomach and small bowel decompressed, unremarkable. Vascular/Lymphatic: Aortic atherosclerosis. No evidence of aneurysm or adenopathy. Reproductive: Prior hysterectomy. No adnexal masses. 2.5 cm cyst in the right ovary. Other: No free fluid or free air. Musculoskeletal: No acute bony abnormality. IMPRESSION: Cholelithiasis. Irregular asymmetric wall thickening within the posterior and fundal wall of the gallbladder concerning for gallbladder cancer. Irregular solid-appearing low-density masses within the liver concerning for metastases. 1.3 cm left renal pelvic stone with mild left hydronephrosis and perinephric stranding. Ureter is difficult to follow. No visible definitive ureteral stones. Sigmoid diverticulosis. Aortic atherosclerosis. These results will be called to the ordering  clinician or representative by the Radiologist Assistant, and communication documented in the PACS or [employer]. Electronically Signed   By: Charlett Nose M.D.   On: 07/18/2023 08:52       Assessment & Plan:  Right upper quadrant abdominal pain Assessment & Plan: CT revealed - Cholelithiasis. Irregular asymmetric wall thickening within the posterior and fundal wall of the gallbladder concerning for gallbladder  cancer. Irregular solid-appearing low-density masses within the liver concerning for metastases. 1.3 cm left renal pelvic stone with mild left hydronephrosis and perinephric stranding. Ureter is difficult to follow. No visible definitive ureteral stones. Sigmoid diverticulosis. She reports today that she is feeling ok. Eating. No nausea or vomiting. Still with some right upper quadrant/upper abdominal discomfort. No severe pain. Bowels moving. Discussed CT results and further w/up. Discussed with oncology.  Plan PET scan as recommended.  F/u with oncology.   Orders: -     CBC with Differential/Platelet -     Basic metabolic panel -     Hepatic function panel  Thoracic aortic ectasia (HCC) Assessment & Plan: Continue blood pressure control - amlodipine.  Continue pravastatin.    Personal history of urinary calculi Assessment & Plan:  1.3 cm left renal pelvic stone with mild left hydronephrosis and perinephric stranding. Ureter is difficult to follow. No visible definitive ureteral stones. Discussed findings on CT scan with her.  Information forwarded to urology.  Has f/u appt with urology next week.    Hypothyroidism, unspecified type Assessment & Plan: On thyroid replacement.  Follow tsh.    Hyperlipidemia, unspecified hyperlipidemia type Assessment & Plan: On pravastatin.  Follow lipid panel and liver function tests.     Hyperglycemia Assessment & Plan: Follow met b and a1c.    History of breast cancer Assessment & Plan: S/p mastectomy.  Prescribed arimidex.  Desired not to take. Followed by oncology.     Essential (primary) hypertension Assessment & Plan: On amlodipine.  Blood pressure as outlined. Follow pressures.  Follow metabolic panel.    Depression, major, single episode, complete remission [facility]) Assessment & Plan: On lexapro. Stable. Follow.        Dale Minneola, MD

## 2023-07-20 LAB — BASIC METABOLIC PANEL
BUN: 16 mg/dL (ref 6–23)
CO2: 27 meq/L (ref 19–32)
Calcium: 9 mg/dL (ref 8.4–10.5)
Chloride: 104 meq/L (ref 96–112)
Creatinine, Ser: 0.7 mg/dL (ref 0.40–1.20)
GFR: 74.08 mL/min (ref >60.00)
Glucose, Bld: 89 mg/dL (ref 70–99)
Potassium: 4.4 meq/L (ref 3.5–5.1)
Sodium: 140 meq/L (ref 135–145)

## 2023-07-20 LAB — CBC WITH DIFFERENTIAL/PLATELET
Basophils Absolute: 0.1 10*3/uL (ref 0.0–0.1)
Basophils Relative: 1.2 % (ref 0.0–3.0)
Eosinophils Absolute: 0.1 10*3/uL (ref 0.0–0.7)
Eosinophils Relative: 0.8 % (ref 0.0–5.0)
HCT: 39.3 % (ref 36.0–46.0)
Hemoglobin: 12.7 g/dL (ref 12.0–15.0)
Lymphocytes Relative: 14.9 % (ref 12.0–46.0)
Lymphs Abs: 1.5 10*3/uL (ref 0.7–4.0)
MCHC: 32.2 g/dL (ref 30.0–36.0)
MCV: 86.4 fL (ref 78.0–100.0)
Monocytes Absolute: 1.5 10*3/uL — ABNORMAL HIGH (ref 0.1–1.0)
Monocytes Relative: 14.7 % — ABNORMAL HIGH (ref 3.0–12.0)
Neutro Abs: 7.1 10*3/uL (ref 1.4–7.7)
Neutrophils Relative %: 68.4 % (ref 43.0–77.0)
Platelets: 353 10*3/uL (ref 150.0–400.0)
RBC: 4.55 Mil/uL (ref 3.87–5.11)
RDW: 14.5 % (ref 11.5–15.5)
WBC: 10.3 10*3/uL (ref 4.0–10.5)

## 2023-07-20 LAB — HEPATIC FUNCTION PANEL
ALT: 9 U/L (ref 0–35)
AST: 19 U/L (ref 0–37)
Albumin: 3.7 g/dL (ref 3.5–5.2)
Alkaline Phosphatase: 100 U/L (ref 39–117)
Bilirubin, Direct: 0.1 mg/dL (ref 0.0–0.3)
Total Bilirubin: 0.7 mg/dL (ref 0.2–1.2)
Total Protein: 6.5 g/dL (ref 6.0–8.3)

## 2023-07-21 ENCOUNTER — Telehealth: Payer: Self-pay | Admitting: Internal Medicine

## 2023-07-21 ENCOUNTER — Other Ambulatory Visit: Payer: Self-pay | Admitting: Internal Medicine

## 2023-07-21 DIAGNOSIS — R1011 Right upper quadrant pain: Secondary | ICD-10-CM

## 2023-07-21 DIAGNOSIS — K769 Liver disease, unspecified: Secondary | ICD-10-CM

## 2023-07-21 DIAGNOSIS — R935 Abnormal findings on diagnostic imaging of other abdominal regions, including retroperitoneum: Secondary | ICD-10-CM

## 2023-07-21 NOTE — Telephone Encounter (Signed)
[facility] called and states they do not perform the WB petscan. She states that [place] [facility] does them. Order needs to be changed to that location. [phone] if would like to call [place]

## 2023-07-21 NOTE — Progress Notes (Signed)
Order placed for PET scan after discussion with Dr Donneta Romberg.  Order placed for referral to oncology.

## 2023-07-21 NOTE — Telephone Encounter (Signed)
[place] does not do whole body PET scans. Ok to order for [facility]?

## 2023-07-21 NOTE — Telephone Encounter (Signed)
I am ok to order for [place], but please let pt know and confirm ok with her. (And her daughter).  I had told them the scan would be here.

## 2023-07-23 ENCOUNTER — Encounter: Payer: Self-pay | Admitting: Internal Medicine

## 2023-07-23 NOTE — Assessment & Plan Note (Signed)
On thyroid replacement.  Follow tsh.  

## 2023-07-23 NOTE — Assessment & Plan Note (Signed)
CT revealed - Cholelithiasis. Irregular asymmetric wall thickening within the posterior and fundal wall of the gallbladder concerning for gallbladder cancer. Irregular solid-appearing low-density masses within the liver concerning for metastases. 1.3 cm left renal pelvic stone with mild left hydronephrosis and perinephric stranding. Ureter is difficult to follow. No visible definitive ureteral stones. Sigmoid diverticulosis. She reports today that she is feeling ok. Eating. No nausea or vomiting. Still with some right upper quadrant/upper abdominal discomfort. No severe pain. Bowels moving. Discussed CT results and further w/up. Discussed with oncology.  Plan PET scan as recommended.  F/u with oncology.

## 2023-07-23 NOTE — Assessment & Plan Note (Signed)
1.3 cm left renal pelvic stone with mild left hydronephrosis and perinephric stranding. Ureter is difficult to follow. No visible definitive ureteral stones. Discussed findings on CT scan with her.  Information forwarded to urology.  Has f/u appt with urology next week.

## 2023-07-23 NOTE — Assessment & Plan Note (Signed)
Continue blood pressure control - amlodipine.  Continue pravastatin.  

## 2023-07-23 NOTE — Assessment & Plan Note (Signed)
On amlodipine.  Blood pressure as outlined.  Follow pressures.  Follow metabolic panel.  

## 2023-07-23 NOTE — Assessment & Plan Note (Signed)
On lexapro.  Stable.  Follow.

## 2023-07-23 NOTE — Assessment & Plan Note (Signed)
Follow met b and a1c.  

## 2023-07-23 NOTE — Assessment & Plan Note (Signed)
On pravastatin.  Follow lipid panel and liver function tests.   

## 2023-07-23 NOTE — Assessment & Plan Note (Signed)
S/p mastectomy.  Prescribed arimidex.  Desired not to take. Followed by oncology.

## 2023-07-24 ENCOUNTER — Other Ambulatory Visit: Payer: Self-pay | Admitting: Internal Medicine

## 2023-07-24 DIAGNOSIS — R935 Abnormal findings on diagnostic imaging of other abdominal regions, including retroperitoneum: Secondary | ICD-10-CM

## 2023-07-24 NOTE — Progress Notes (Unsigned)
07/26/2023 11:33 AM   Jennifer Bernard September 16, 1926 [id number]  Referring provider: Dale Malverne [place], MD [address] [place],  [place] [address]  Urological history: 1. Nocturia -contributing risk factors of obstructive sleep apnea, hypertension, diabetes, arthritis, asthma, anxiety and depression -failed trospium IR 20 mg qhs  2.  Nephrolithiasis -Left URS, 2016  3. rUTI's -Contributing factors of age, vaginal atrophy, diabetes -documented positive urine cultures over the last year  05/05/2021 - e.coli   No chief complaint on file.  HPI: Jennifer Bernard is a 89 y.o. female who presents today for urinary issues of urinary frequency and nocturia.  Previous records reviewed.  She was seen by her PCP, Dr. Lorin Picket, for some right flank pain.  Urine revealed a few red blood cells.  CT scan performed. CT revealed - Cholelithiasis. Irregular asymmetric wall thickening within the posterior and fundal wall of the gallbladder concerning for gallbladder cancer. Irregular solid-appearing low-density masses within the liver concerning for metastases. 1.3 cm left renal pelvic stone with mild left hydronephrosis and perinephric stranding. Ureter is difficult to follow. No visible definitive ureteral stones.   Her serum creatinine is normal at 0.7.    In regards to her urinary symptoms, she had seen pulmonology and they recommended overnight oxygen which he found bothersome and did not continue.  PVR ***   PMH: Past Medical History:  Diagnosis Date   Asymptomatic menopausal state 02/20/1984   Overview:  Naturally occuring and asymptomatic Vaginal atrophy present on topical estrogen Overview:  Overview:  Naturally occuring and asymptomatic Vaginal atrophy present on topical estrogen   Basal cell carcinoma of skin 02/19/2013   Overview:  Basal cell on her nasolabial fold 2014 S/p Mohs by Dr. Adriana Simas Followed by local dermatologist regularly Overview:  Overview:  Followed  by Dr. Adriana Simas   Cyst of ovary 02/19/2009   Overview:  Ovarian cyst on right stable 2004-2010 Cyst ? larger on CT 2012 Stable on follow up US Followed by Dr. Leda Quail Overview:  Overview:  Ovarian cyst on right stable 2004-2010 Cyst ? larger on CT 2012 Stable on follow up US Followed by Dr. Leda Quail   Depression, major, single episode, complete remission (HCC) 07/18/2017   Diffuse cystic mastopathy of breast 02/19/2013   Overview:  Mother with breast cancer later in life Two breast biopsies both with normal pathology Annual mammograms recommended, last 2016 Followed at [facility]  Last Assessment & Plan:  Relevant Hx: Course: Daily Update: Today's Plan: Overview:  Overview:  Mother with breast cancer later in life Two breast biopsies both with normal pathology Annual mammograms recommended, last 201   Essential (primary) hypertension 02/19/2013   Overview:  Goal blood pressure < 135/85 On atenolol Overview:  Overview:  Goal blood pressure < 135/85 On atenolol   Family history of breast cancer 05/12/2022   Hyperlipidemia 02/19/2013   Overview:  Goal LDL < 130, last value 92 in 2017 On Pravachol Overview:  Overview:  Goal LDL < 130, last value 82 in 2012 On Pravachol   Hypothyroidism 02/28/2012   Overview:  started on T4  TFT normal 2017 Followed by Dr. Clydie Braun Overview:  Overview:  started on T4  TFT normal 2014 Followed by Dr. Clydie Braun   Impaired glucose tolerance 02/20/2003   Overview:  Overview:  Glucose goal < 100, last value 94 in 2014 HgbA1c goal < 6.0, last value 5.3 in 2014 Urine microalbumin negative in 2014 On diet and exercise   Nontoxic multinodular  goiter 03/06/2003   Overview:  Asymptomatic nodular thyroid Stable [place] 2004-2014 Followed by Dr. Clydie Braun Overview:  Overview:  Asymptomatic nodular thyroid Stable [place] 2004-2014 Followed by Dr. Clydie Braun   OSA on CPAP 01/13/2015   Overview:  Sleep study sent to medical records:   Sleep efficiency 22%, latency 24 min, # awakenings 11,  res desat N=10, index 17.8  Apnea 1 for 16 sec, hypopnea 16 for 18 sec, total sleep time only 90 min so index = 11 She did not sleep much and the index puts her in mild category Repeat study a few days later when she slept 187 min her AHI = 4.6 which is normal, average O2 sat 94% I don't think    Osteoarthritis 02/20/2003   Overview:  Mild bilateral hands Right CMC Right hip Left knee s/p meniscus tear All symptomatically stable Overview:  Overview:  Mild bilateral hands Right CMC Right hip Left knee s/p meniscus tear All symptomatically stable   Osteoporosis 02/20/2003   Overview:  Bone density 2004 LS -3.32 and RH-1.62 Bone density 2008 LS -3.14 and RH -1.22 Bone density 2009 LS -3.17 and RH -1.21 Bone density 2010 LS -3.14 and RH -1.42  Bone density 2014 LS -2.7 (L3 -3.1) and RH -1.5 Bone density 2017 LS -2.3 and RH -1.4 (neck -1.7) Vitamin D level 38 in 2012 On Boniva 2004 to 2010, restarted 2015 Repeat bone density in 2020 and stop Boniva  Last Assessment & Pl   Prediabetes 02/20/2003   Overview:  Glucose goal < 100, last value 91 in 2017 HgbA1c goal < 6.0, last value 5.6 in 2017 Urine microalbumin negative in 2017 On diet and exercise   Recurrent UTI 10/19/2014   Squamous cell carcinoma of skin 03/14/2019   Left mid brow. SCCis, hypertrophic. EDC    Surgical History: Past Surgical History:  Procedure Laterality Date   ABDOMINAL HYSTERECTOMY  1975   MASTECTOMY Left    URETEROSCOPY     URETEROSCOPY WITH HOLMIUM LASER LITHOTRIPSY      Home Medications:  Allergies as of 07/26/2023       Reactions   Penicillins Hives   Other reaction(s): Unknown   Ceftin [cefuroxime Axetil] Diarrhea   GI distress   Ramipril    Other reaction(s): Cough Other reaction(s): Cough        Medication List        Accurate as of July 24, 2023 11:33 AM. If you have any questions, ask your nurse or doctor.          amLODipine 5 MG tablet Commonly known as: NORVASC TAKE 1 TABLET BY MOUTH DAILY    B-12 1000 MCG Tabs TAKE 1 TABLET BY MOUTH DAILY.   escitalopram 10 MG tablet Commonly known as: LEXAPRO TAKE 1 TABLET BY MOUTH DAILY (FOR MOOD/DEPRESSION)   levothyroxine 75 MCG tablet Commonly known as: SYNTHROID   mupirocin ointment 2 % Commonly known as: BACTROBAN Apply 1 Application topically 2 (two) times daily.   pantoprazole 40 MG tablet Commonly known as: PROTONIX TAKE 1 TABLET BY MOUTH 30 MINUTES BEFOREEVENING MEAL   pravastatin 40 MG tablet Commonly known as: PRAVACHOL TAKE ONE TABLET EACH EVENING   triamcinolone cream 0.1 % Commonly known as: KENALOG Apply 1 Application topically 2 (two) times daily. Apply to affected area on arm bid x 1 week.        Allergies:  Allergies  Allergen Reactions   Penicillins Hives    Other reaction(s): Unknown   Ceftin [Cefuroxime Axetil] Diarrhea  GI distress   Ramipril     Other reaction(s): Cough Other reaction(s): Cough     Family History: Family History  Problem Relation Age of Onset   Breast cancer Mother        dx late 55s   Skin cancer Father        non-melanoma; head   Liver cancer Sister        or other primary: d. 31   Breast cancer Maternal Grandmother        dx late 6s   Skin cancer Daughter        non-melanoma    Social History:  reports that she has never smoked. She has never used smokeless tobacco. She reports current alcohol use. She reports that she does not use drugs.  ROS: Pertinent ROS in HPI  Physical Exam: There were no vitals taken for this visit.  Constitutional:  Well nourished. Alert and oriented, No acute distress. HEENT: [place] AT, moist mucus membranes.  Trachea midline, no masses. Cardiovascular: No clubbing, cyanosis, or edema. Respiratory: Normal respiratory effort, no increased work of breathing. GU: No CVA tenderness.  No bladder fullness or masses.  Recession of labia minora, dry, pale vulvar vaginal mucosa and loss of mucosal ridges and folds.  Normal urethral meatus,  no lesions, no prolapse, no discharge.   No urethral masses, tenderness and/or tenderness. No bladder fullness, tenderness or masses. *** vagina mucosa, *** estrogen effect, no discharge, no lesions, *** pelvic support, *** cystocele and *** rectocele noted.  No cervical motion tenderness.  Uterus is freely mobile and non-fixed.  No adnexal/parametria masses or tenderness noted.  Anus and perineum are without rashes or lesions.   ***  Neurologic: Grossly intact, no focal deficits, moving all 4 extremities. Psychiatric: Normal mood and affect.    Laboratory Data:    Latest Ref Rng & Units 07/19/2023    2:09 PM 04/20/2023    1:51 PM 12/07/2022    8:19 AM  Hepatic Function  Total Protein 6.0 - 8.3 g/dL 6.5  6.9  6.3   Albumin 3.5 - 5.2 g/dL 3.7  3.7  3.9   AST 0 - 37 U/L 19  18  17    ALT 0 - 35 U/L 9  10  9    Alk Phosphatase 39 - 117 U/L 100  64  61   Total Bilirubin 0.2 - 1.2 mg/dL 0.7  0.6  0.8   Bilirubin, Direct 0.0 - 0.3 mg/dL 0.1  0.1  0.2     BMET    Component Value Date/Time   NA 140 07/19/2023 1409   K 4.4 07/19/2023 1409   CL 104 07/19/2023 1409   CO2 27 07/19/2023 1409   GLUCOSE 89 07/19/2023 1409   BUN 16 07/19/2023 1409   CREATININE 0.70 07/19/2023 1409   CALCIUM 9.0 07/19/2023 1409   GFRNONAA >60 07/25/2022 0937    CBC    Component Value Date/Time   WBC 10.3 07/19/2023 1409   RBC 4.55 07/19/2023 1409   HGB 12.7 07/19/2023 1409   HCT 39.3 07/19/2023 1409   PLT 353.0 07/19/2023 1409   MCV 86.4 07/19/2023 1409   MCH 29.2 01/13/2023 1423   MCHC 32.2 07/19/2023 1409   RDW 14.5 07/19/2023 1409   LYMPHSABS 1.5 07/19/2023 1409   MONOABS 1.5 (H) 07/19/2023 1409   EOSABS 0.1 07/19/2023 1409   BASOSABS 0.1 07/19/2023 1409    Urinalysis Component     Latest Ref Rng 06/27/2023  Color, Urine  Yellow;Lt. Yellow;Straw;Dark Yellow;Amber;Green;Red;Brown  Dark Yellow !   Appearance     Clear;Turbid;Slightly Cloudy;Cloudy  Sl Cloudy !   Specific Gravity, Urine     1.000  - 1.030  1.020   pH     5.0 - 8.0  6.5   Total Protein, Urine-UPE24     Negative  100 !   Urine Glucose     Negative  NEGATIVE   Ketones, ur     Negative  NEGATIVE   Bilirubin Urine     Negative  NEGATIVE   Hgb urine dipstick     Negative  LARGE !   Urobilinogen, UA     0.2 or 1.0 E.U./dL 0.2   Urobilinogen, UA      0.2   Leukocytes,Ua     Negative  SMALL !   Nitrite     Negative  NEGATIVE   WBC, UA     0-2/hpf  7-10/hpf !   RBC / HPF     0-2/hpf  TNTC(>50/hpf) !   Mucus, UA     None  Presence of !   Squamous Epithelial / HPF     Rare(0-4/hpf)  Rare(0-4/hpf)   Bacteria, UA     None    Amorphous     None;Present    RBC, Urine     0 - 2 /hpf   Epithelial Cells (non renal)     0 - 10 /hpf   Casts     None seen /lpf   Cast Type     N/A    Ca Oxalate Crys, UA     None  Presence of !   Glucose     Negative  Negative (C)  Specific Gravity, UA     1.010 - 1.025  1.025   RBC, UA Large   pH, UA     5.0 - 8.0  6.0   Protein,UA     Negative  Positive !   Nitrite, UA Negative   Leukocytes,UA     Negative  Small (1+) !     Legend: ! Abnormal (C) Corrected    06/27/2023  MICRO NUMBER: [id number]   SPECIMEN QUALITY: Adequate   Sample Source URINE   STATUS: FINAL   Result No Growth   I have reviewed the labs.  See HPI.      Pertinent Imaging: Narrative & Impression  CLINICAL DATA:  Right flank pain   EXAM: CT ABDOMEN AND PELVIS WITHOUT CONTRAST   TECHNIQUE: Multidetector CT imaging of the abdomen and pelvis was performed following the standard protocol without IV contrast.   RADIATION DOSE REDUCTION: This exam was performed according to the departmental dose-optimization program which includes automated exposure control, adjustment of the mA and/or kV according to patient size and/or use of iterative reconstruction technique.   COMPARISON:  08/11/2011.  Plain film 06/27/2023   FINDINGS: Lower chest: No acute abnormality. Aortic atherosclerosis.  Small hiatal hernia.   Hepatobiliary: Several small scattered cysts in the liver. In addition, there are low-density areas within the liver which appear solid and concerning for possible metastases. One such mass measures 5.3 x 4.3 cm in the medial segment of the left hepatic lobe. Other similar solid-appearing lesions are seen in the right hepatic lobe measuring up to 2.6 cm. Gallstones noted within the gallbladder. In addition, there is abnormal irregular asymmetric wall thickening within the gallbladder measuring up to 2.4 cm in thickness. Given the asymmetry and irregularity, this is concerning for possible gallbladder cancer.  Pancreas: No focal abnormality or ductal dilatation.   Spleen: No focal abnormality.  Normal size.   Adrenals/Urinary Tract: Adrenal glands unremarkable. 1.3 cm left renal pelvic stone. There is mild left hydronephrosis and perinephric stranding. Ureters difficult to follow but appear no hydronephrosis on the right. Bilateral low-density lesions within the kidneys measuring up to 3 cm on the right and 1.7 cm on the left most likely cysts. Decompressed.   Stomach/Bowel: Sigmoid diverticulosis. No active diverticulitis. Stomach and small bowel decompressed, unremarkable.   Vascular/Lymphatic: Aortic atherosclerosis. No evidence of aneurysm or adenopathy.   Reproductive: Prior hysterectomy. No adnexal masses. 2.5 cm cyst in the right ovary.   Other: No free fluid or free air.   Musculoskeletal: No acute bony abnormality.   IMPRESSION: Cholelithiasis. Irregular asymmetric wall thickening within the posterior and fundal wall of the gallbladder concerning for gallbladder cancer.   Irregular solid-appearing low-density masses within the liver concerning for metastases.   1.3 cm left renal pelvic stone with mild left hydronephrosis and perinephric stranding. Ureter is difficult to follow. No visible definitive ureteral stones.   Sigmoid  diverticulosis.   Aortic atherosclerosis.   These results will be called to the ordering clinician or representative by the Radiologist Assistant, and communication documented in the PACS or [employer].     Electronically Signed   By: Charlett Nose M.D.   On: 07/18/2023 08:52  I have independently reviewed the films.  See HPI.     Assessment & Plan:    1. Left UPJ stone -1.3 cm left upj stone with mild left hydronephrosis -Stone density follow-up 1400 Hounsfield units -Discussed with patient that treating the stone would involve either PCNL or URS -Explained the PCNL (Percutaneous Nephrolithotomy) involves making a half-inch incision in their back, just large enough to allow a rigid telescope (nephroscope) to be passed.  An instrument is passed through the nephroscope to break up the stone and suction out the pieces.  After the procedure, a tube is usually left in the kidney to drain urine into a bag outside the body (nephrostomy bag).  The tube is left in over night or for a few days.  You will likely stay overnight in the hospital.   Risks of this procedure are bleeding, infection, injury to the surrounding organs, damage to the kidney, risks of general anesthesia (MI, CVA, paralysis, coma and/or death.) and retained stone fragments requiring a separate procedure to treat.    - explained to the patient how the procedure is performed and the risks involved -informed the patient that they will have an ureteral stent, which will remain in place for approximately 3-10 days and can be associated with flank pain, bladder pain, dysuria, urgency, frequency, urinary leakage, and gross hematuria. - stent may be removed in the office with a cystoscope or patient may be instructed to remove the stent themselves by the string and that is decided on the day of the procedure - residual stones within the kidney or ureter may be present after the procedure and may need to have these addressed at a  different encounter - injury to the ureter is the most common intra-operative risk, it may result in an open procedure to correct the defect - infection and bleeding are also risks - explained the risks of general anesthesia, such as: MI, CVA, paralysis, coma and/or death. -In light of her possible findings of gallbladder cancer with metastases, she may choose not to treat the stone as she will have a greater risk  of infection and it will be likely difficult to clear the entire stone with 1 treatment as well as the possible discomfort of having ureteral stent or a nephrostomy tube placed  2. Nocturia -Reviewed the pathophysiology of sleep apnea and how it contributes to nocturia   No follow-ups on file.  These notes generated with voice recognition software. I apologize for typographical errors.  Cloretta Ned  [facility] [address]  [address] [place], [place] [address] [phone]

## 2023-07-24 NOTE — Telephone Encounter (Signed)
New order pended for you per secure chat.

## 2023-07-24 NOTE — Telephone Encounter (Signed)
Correct order in for PET.

## 2023-07-24 NOTE — Progress Notes (Signed)
Order placed for PET scan skull to mid thigh

## 2023-07-24 NOTE — Addendum Note (Signed)
Addended by: Charm Barges on: 07/24/2023 12:16 PM   Modules accepted: Orders

## 2023-07-26 ENCOUNTER — Encounter: Payer: Self-pay | Admitting: Urology

## 2023-07-26 ENCOUNTER — Ambulatory Visit: Payer: Medicare HMO | Admitting: Urology

## 2023-07-26 VITALS — BP 105/70 | HR 111 | Ht 62.0 in | Wt 133.0 lb

## 2023-07-26 DIAGNOSIS — N201 Calculus of ureter: Secondary | ICD-10-CM | POA: Diagnosis not present

## 2023-07-26 DIAGNOSIS — R351 Nocturia: Secondary | ICD-10-CM

## 2023-07-26 LAB — BLADDER SCAN AMB NON-IMAGING: PVR: 0 WU

## 2023-07-26 MED ORDER — FESOTERODINE FUMARATE ER 4 MG PO TB24: 4.0000 mg | ORAL_TABLET | Freq: Every day | ORAL | 3 refills | Status: DC

## 2023-07-31 ENCOUNTER — Ambulatory Visit
Admission: RE | Admit: 2023-07-31 | Discharge: 2023-07-31 | Disposition: A | Discharge status: Home / Self Care | Payer: Medicare PPO | Source: Ambulatory Visit | Attending: Internal Medicine | Admitting: Internal Medicine

## 2023-07-31 ENCOUNTER — Telehealth: Payer: Self-pay

## 2023-07-31 ENCOUNTER — Encounter: Payer: Medicare PPO | Admitting: Internal Medicine

## 2023-07-31 DIAGNOSIS — C801 Malignant (primary) neoplasm, unspecified: Secondary | ICD-10-CM | POA: Diagnosis not present

## 2023-07-31 DIAGNOSIS — R1011 Right upper quadrant pain: Secondary | ICD-10-CM | POA: Insufficient documentation

## 2023-07-31 DIAGNOSIS — R935 Abnormal findings on diagnostic imaging of other abdominal regions, including retroperitoneum: Secondary | ICD-10-CM | POA: Diagnosis not present

## 2023-07-31 DIAGNOSIS — K828 Other specified diseases of gallbladder: Secondary | ICD-10-CM | POA: Diagnosis not present

## 2023-07-31 DIAGNOSIS — C787 Secondary malignant neoplasm of liver and intrahepatic bile duct: Secondary | ICD-10-CM | POA: Diagnosis not present

## 2023-07-31 LAB — GLUCOSE, CAPILLARY: Glucose-Capillary: 104 mg/dL — ABNORMAL HIGH (ref 70–99)

## 2023-07-31 MED ORDER — FLUDEOXYGLUCOSE F - 18 (FDG) INJECTION
7.2300 | Freq: Once | INTRAVENOUS | Status: AC | PRN
  Administered 2023-07-31: 7.23 via INTRAVENOUS

## 2023-07-31 NOTE — Telephone Encounter (Signed)
[facility] called to give call report of abnormal PET scan. Per our discussion, already has appt with oncology.

## 2023-08-01 ENCOUNTER — Inpatient Hospital Stay: Payer: Medicare PPO | Attending: Internal Medicine | Admitting: Internal Medicine

## 2023-08-01 ENCOUNTER — Inpatient Hospital Stay: Payer: Medicare PPO

## 2023-08-01 ENCOUNTER — Encounter: Payer: Self-pay | Admitting: Internal Medicine

## 2023-08-01 VITALS — BP 114/72 | HR 80 | Temp 97.4°F | Ht 62.0 in | Wt 134.1 lb

## 2023-08-01 DIAGNOSIS — Z803 Family history of malignant neoplasm of breast: Secondary | ICD-10-CM | POA: Diagnosis not present

## 2023-08-01 DIAGNOSIS — K5730 Diverticulosis of large intestine without perforation or abscess without bleeding: Secondary | ICD-10-CM | POA: Diagnosis not present

## 2023-08-01 DIAGNOSIS — Z881 Allergy status to other antibiotic agents status: Secondary | ICD-10-CM | POA: Insufficient documentation

## 2023-08-01 DIAGNOSIS — G4733 Obstructive sleep apnea (adult) (pediatric): Secondary | ICD-10-CM | POA: Insufficient documentation

## 2023-08-01 DIAGNOSIS — I700 Atherosclerosis of aorta: Secondary | ICD-10-CM | POA: Diagnosis not present

## 2023-08-01 DIAGNOSIS — Z9012 Acquired absence of left breast and nipple: Secondary | ICD-10-CM | POA: Diagnosis not present

## 2023-08-01 DIAGNOSIS — Z85828 Personal history of other malignant neoplasm of skin: Secondary | ICD-10-CM | POA: Insufficient documentation

## 2023-08-01 DIAGNOSIS — C787 Secondary malignant neoplasm of liver and intrahepatic bile duct: Secondary | ICD-10-CM | POA: Diagnosis not present

## 2023-08-01 DIAGNOSIS — K449 Diaphragmatic hernia without obstruction or gangrene: Secondary | ICD-10-CM | POA: Diagnosis not present

## 2023-08-01 DIAGNOSIS — Z90710 Acquired absence of both cervix and uterus: Secondary | ICD-10-CM | POA: Diagnosis not present

## 2023-08-01 DIAGNOSIS — I10 Essential (primary) hypertension: Secondary | ICD-10-CM | POA: Insufficient documentation

## 2023-08-01 DIAGNOSIS — Z8509 Personal history of malignant neoplasm of other digestive organs: Secondary | ICD-10-CM | POA: Insufficient documentation

## 2023-08-01 DIAGNOSIS — N83201 Unspecified ovarian cyst, right side: Secondary | ICD-10-CM | POA: Diagnosis not present

## 2023-08-01 DIAGNOSIS — C786 Secondary malignant neoplasm of retroperitoneum and peritoneum: Secondary | ICD-10-CM | POA: Diagnosis not present

## 2023-08-01 DIAGNOSIS — Z853 Personal history of malignant neoplasm of breast: Secondary | ICD-10-CM | POA: Insufficient documentation

## 2023-08-01 DIAGNOSIS — R1011 Right upper quadrant pain: Secondary | ICD-10-CM | POA: Diagnosis not present

## 2023-08-01 DIAGNOSIS — K8020 Calculus of gallbladder without cholecystitis without obstruction: Secondary | ICD-10-CM | POA: Diagnosis not present

## 2023-08-01 DIAGNOSIS — Z79899 Other long term (current) drug therapy: Secondary | ICD-10-CM | POA: Diagnosis not present

## 2023-08-01 DIAGNOSIS — C23 Malignant neoplasm of gallbladder: Secondary | ICD-10-CM | POA: Diagnosis not present

## 2023-08-01 DIAGNOSIS — E039 Hypothyroidism, unspecified: Secondary | ICD-10-CM | POA: Insufficient documentation

## 2023-08-01 DIAGNOSIS — E785 Hyperlipidemia, unspecified: Secondary | ICD-10-CM | POA: Insufficient documentation

## 2023-08-01 DIAGNOSIS — Z880 Allergy status to penicillin: Secondary | ICD-10-CM | POA: Insufficient documentation

## 2023-08-01 DIAGNOSIS — Z800 Family history of malignant neoplasm of digestive organs: Secondary | ICD-10-CM | POA: Insufficient documentation

## 2023-08-01 DIAGNOSIS — Z87440 Personal history of urinary (tract) infections: Secondary | ICD-10-CM | POA: Insufficient documentation

## 2023-08-01 DIAGNOSIS — Z808 Family history of malignant neoplasm of other organs or systems: Secondary | ICD-10-CM | POA: Insufficient documentation

## 2023-08-01 DIAGNOSIS — N132 Hydronephrosis with renal and ureteral calculous obstruction: Secondary | ICD-10-CM | POA: Diagnosis not present

## 2023-08-01 NOTE — Progress Notes (Signed)
[facility] CONSULT NOTE  Patient Care Team: Dale [place]rian [place], MD as PCP - General (Internal Medicine) Benita Gutter, RN as Oncology Nurse Navigator Earna Coder, MD as Consulting Physician (Oncology)  CHIEF COMPLAINTS/PURPOSE OF CONSULTATION: gall bladder cancer  IMPRESSION: 1. Multiple intensely hypermetabolic lesions in the liver consistent with malignancy. Favor metastatic disease. 2. Hypermetabolic thickening of the gallbladder wall is most concerning for primary gallbladder carcinoma. 3. Hypermetabolic nodal metastasis in the porta hepatis. 4. Several small hypermetabolic nodules along the ventral peritoneal space are concerning for peritoneal metastasis.  HISTORY OF PRESENTING ILLNESS: Patient ambulating- with assistance.  Accompanied by family- daughter.   Jennifer Bernard 89 y.o.  female pleasant patient with no significant past medical history has been referred to [place] for possible gallbladder cancer.  Patient's noted to have mild intermittent right upper quadrant pain over the last few weeks.  This led to a stone protocol CT scan that showed incidental liver lesions gallbladder thickening.  Patient underwent a PET scan for further evaluation.  Patient denies any significant on abdominal pain.  No constipation or diarrhea.   Patient lives in [place]; independent of the ADLs.  She still goes out for dinner with family twice a week or so.  She has been slightly unsteady on the feet however no falls.  Her appetite is good.  No weight loss.  Denies any significant nausea.  Review of Systems  Constitutional:  Negative for chills, diaphoresis and fever.  HENT:  Negative for nosebleeds and sore throat.   Eyes:  Negative for double vision.  Respiratory:  Negative for cough, hemoptysis, sputum production, shortness of breath and wheezing.   Cardiovascular:  Negative for chest pain, palpitations, orthopnea and leg swelling.   Gastrointestinal:  Positive for abdominal pain. Negative for blood in stool, constipation, diarrhea, melena, nausea and vomiting.  Genitourinary:  Negative for dysuria, frequency and urgency.  Musculoskeletal:  Negative for back pain and joint pain.  Skin: Negative.  Negative for itching and rash.  Neurological:  Negative for dizziness, tingling, focal weakness, weakness and headaches.  Endo/Heme/Allergies:  Does not bruise/bleed easily.  Psychiatric/Behavioral:  Negative for depression. The patient is not nervous/anxious and does not have insomnia.     MEDICAL HISTORY:  Past Medical History:  Diagnosis Date   Asymptomatic menopausal state 02/20/1984   Overview:  Naturally occuring and asymptomatic Vaginal atrophy present on topical estrogen Overview:  Overview:  Naturally occuring and asymptomatic Vaginal atrophy present on topical estrogen   Basal cell carcinoma of skin 02/19/2013   Overview:  Basal cell on her nasolabial fold 2014 S/p Mohs by Dr. Adriana Simas Followed by local dermatologist regularly Overview:  Overview:  Followed by Dr. Adriana Simas   Breast cancer [facility])    Cyst of ovary 02/19/2009   Overview:  Ovarian cyst on right stable 2004-2010 Cyst ? larger on CT 2012 Stable on follow up US Followed by Dr. Leda Quail Overview:  Overview:  Ovarian cyst on right stable 2004-2010 Cyst ? larger on CT 2012 Stable on follow up US Followed by Dr. Leda Quail   Depression, major, single episode, complete remission (HCC) 07/18/2017   Diffuse cystic mastopathy of breast 02/19/2013   Overview:  Mother with breast cancer later in life Two breast biopsies both with normal pathology Annual mammograms recommended, last 2016 Followed at [facility]  Last Assessment & Plan:  Relevant Hx: Course: Daily Update: Today's Plan: Overview:  Overview:  Mother with breast cancer later in life  Two breast biopsies both with normal pathology Annual mammograms recommended, last 201   Essential (primary)  hypertension 02/19/2013   Overview:  Goal blood pressure < 135/85 On atenolol Overview:  Overview:  Goal blood pressure < 135/85 On atenolol   Family history of breast cancer 05/12/2022   Hyperlipidemia 02/19/2013   Overview:  Goal LDL < 130, last value 92 in 2017 On Pravachol Overview:  Overview:  Goal LDL < 130, last value 82 in 2012 On Pravachol   Hypothyroidism 02/28/2012   Overview:  started on T4  TFT normal 2017 Followed by Dr. Clydie Braun Overview:  Overview:  started on T4  TFT normal 2014 Followed by Dr. Clydie Braun   Impaired glucose tolerance 02/20/2003   Overview:  Overview:  Glucose goal < 100, last value 94 in 2014 HgbA1c goal < 6.0, last value 5.3 in 2014 Urine microalbumin negative in 2014 On diet and exercise   Nontoxic multinodular goiter 03/06/2003   Overview:  Asymptomatic nodular thyroid Stable [place] 2004-2014 Followed by Dr. Clydie Braun Overview:  Overview:  Asymptomatic nodular thyroid Stable [place] 2004-2014 Followed by Dr. Clydie Braun   OSA on CPAP 01/13/2015   Overview:  Sleep study sent to medical records:   Sleep efficiency 22%, latency 24 min, # awakenings 11, res desat N=10, index 17.8  Apnea 1 for 16 sec, hypopnea 16 for 18 sec, total sleep time only 90 min so index = 11 She did not sleep much and the index puts her in mild category Repeat study a few days later when she slept 187 min her AHI = 4.6 which is normal, average O2 sat 94% I don't think    Osteoarthritis 02/20/2003   Overview:  Mild bilateral hands Right CMC Right hip Left knee s/p meniscus tear All symptomatically stable Overview:  Overview:  Mild bilateral hands Right CMC Right hip Left knee s/p meniscus tear All symptomatically stable   Osteoporosis 02/20/2003   Overview:  Bone density 2004 LS -3.32 and RH-1.62 Bone density 2008 LS -3.14 and RH -1.22 Bone density 2009 LS -3.17 and RH -1.21 Bone density 2010 LS -3.14 and RH -1.42  Bone density 2014 LS -2.7 (L3 -3.1) and RH -1.5 Bone density 2017 LS -2.3 and RH -1.4 (neck  -1.7) Vitamin D level 38 in 2012 On Boniva 2004 to 2010, restarted 2015 Repeat bone density in 2020 and stop Boniva  Last Assessment & Pl   Prediabetes 02/20/2003   Overview:  Glucose goal < 100, last value 91 in 2017 HgbA1c goal < 6.0, last value 5.6 in 2017 Urine microalbumin negative in 2017 On diet and exercise   Recurrent UTI 10/19/2014   Squamous cell carcinoma of skin 03/14/2019   Left mid brow. SCCis, hypertrophic. EDC    SURGICAL HISTORY: Past Surgical History:  Procedure Laterality Date   ABDOMINAL HYSTERECTOMY  1975   MASTECTOMY Left    URETEROSCOPY     URETEROSCOPY WITH HOLMIUM LASER LITHOTRIPSY      SOCIAL HISTORY: Social History   Socioeconomic History   Marital status: Widowed    Spouse name: Not on file   Number of children: Not on file   Years of education: Not on file   Highest education level: Not on file  Occupational History   Not on file  Tobacco Use   Smoking status: Never   Smokeless tobacco: Never  Vaping Use   Vaping status: Never Used  Substance and Sexual Activity   Alcohol use: Yes    Comment: twice a week, a  glass of wine   Drug use: No   Sexual activity: Not Currently    Birth control/protection: Post-menopausal  Other Topics Concern   Not on file  Social History Narrative   Not on file   Social Drivers of Health   Financial Resource Strain: Low Risk  (03/29/2022)   Overall Financial Resource Strain (CARDIA)    Difficulty of Paying Living Expenses: Not hard at all  Food Insecurity: No Food Insecurity (03/29/2022)   Hunger Vital Sign    Worried About Running Out of Food in the Last Year: Never true    Ran Out of Food in the Last Year: Never true  Transportation Needs: No Transportation Needs (03/29/2022)   PRAPARE - Administrator, Civil Service (Medical): No    Lack of Transportation (Non-Medical): No  Physical Activity: Insufficiently Active (03/29/2022)   Exercise Vital Sign    Days of Exercise per Week: 4 days     Minutes of Exercise per Session: 30 min  Stress: No Stress Concern Present (03/29/2022)   [employer] of Occupational Health - Occupational Stress Questionnaire    Feeling of Stress : Not at all  Social Connections: Unknown (03/29/2022)   Social Connection and Isolation Panel [NHANES]    Frequency of Communication with Friends and Family: More than three times a week    Frequency of Social Gatherings with Friends and Family: More than three times a week    Attends Religious Services: Not on file    Active Member of Clubs or Organizations: Not on file    Attends Banker Meetings: Not on file    Marital Status: Widowed  Intimate Partner Violence: Not At Risk (03/29/2022)   Humiliation, Afraid, Rape, and Kick questionnaire    Fear of Current or Ex-Partner: No    Emotionally Abused: No    Physically Abused: No    Sexually Abused: No    FAMILY HISTORY: Family History  Problem Relation Age of Onset   Breast cancer Mother        dx late 39s   Skin cancer Father        non-melanoma; head   Liver cancer Sister        or other primary: d. 29   Breast cancer Maternal Grandmother        dx late 80s   Skin cancer Daughter        non-melanoma    ALLERGIES:  is allergic to penicillins, ceftin [cefuroxime axetil], and ramipril.  MEDICATIONS:  Current Outpatient Medications  Medication Sig Dispense Refill   amLODipine (NORVASC) 5 MG tablet TAKE 1 TABLET BY MOUTH DAILY 90 tablet 1   Cyanocobalamin (B-12) 1000 MCG TABS TAKE 1 TABLET BY MOUTH DAILY. 100 tablet 1   escitalopram (LEXAPRO) 10 MG tablet TAKE 1 TABLET BY MOUTH DAILY (FOR MOOD/DEPRESSION) 30 tablet 5   levothyroxine (SYNTHROID, LEVOTHROID) 75 MCG tablet      pantoprazole (PROTONIX) 40 MG tablet TAKE 1 TABLET BY MOUTH 30 MINUTES BEFOREEVENING MEAL 90 tablet 3   pravastatin (PRAVACHOL) 40 MG tablet TAKE ONE TABLET EACH EVENING 90 tablet 3   fesoterodine (TOVIAZ) 4 MG TB24 tablet Take 1 tablet (4 mg total) by mouth  daily. (Patient not taking: Reported on 08/01/2023) 30 tablet 3   mupirocin ointment (BACTROBAN) 2 % Apply 1 Application topically 2 (two) times daily. (Patient not taking: Reported on 08/01/2023) 22 g 0   triamcinolone cream (KENALOG) 0.1 % Apply 1 Application topically 2 (two) times  daily. Apply to affected area on arm bid x 1 week. (Patient not taking: Reported on 08/01/2023) 30 g 0   No current facility-administered medications for this visit.    PHYSICAL EXAMINATION:   Vitals:   08/01/23 1352  BP: 114/72  Pulse: 80  Temp: (!) 97.4 F (36.3 C)  SpO2: 99%   Filed Weights   08/01/23 1352  Weight: 134 lb 1.6 oz (60.8 kg)    Physical Exam Vitals and nursing note reviewed.  HENT:     Head: Normocephalic and atraumatic.     Mouth/Throat:     Pharynx: Oropharynx is clear.  Eyes:     Extraocular Movements: Extraocular movements intact.     Pupils: Pupils are equal, round, and reactive to light.  Cardiovascular:     Rate and Rhythm: Normal rate and regular rhythm.  Pulmonary:     Comments: Decreased breath sounds bilaterally.  Abdominal:     Palpations: Abdomen is soft.  Musculoskeletal:        General: Normal range of motion.     Cervical back: Normal range of motion.  Skin:    General: Skin is warm.  Neurological:     General: No focal deficit present.     Mental Status: She is alert and oriented to person, place, and time.  Psychiatric:        Behavior: Behavior normal.        Judgment: Judgment normal.     LABORATORY DATA:  I have reviewed the data as listed Lab Results  Component Value Date   WBC 10.3 07/19/2023   HGB 12.7 07/19/2023   HCT 39.3 07/19/2023   MCV 86.4 07/19/2023   PLT 353.0 07/19/2023   Recent Labs    12/07/22 0819 04/20/23 1351 07/19/23 1409  NA 145 141 140  K 4.1 4.0 4.4  CL 107 105 104  CO2 29 26 27   GLUCOSE 88 112* 89  BUN 13 13 16   CREATININE 0.84 0.92 0.70  CALCIUM 9.5 9.3 9.0  PROT 6.3 6.9 6.5  ALBUMIN 3.9 3.7 3.7  AST  17 18 19   ALT 9 10 9   ALKPHOS 61 64 100  BILITOT 0.8 0.6 0.7  BILIDIR 0.2 0.1 0.1    RADIOGRAPHIC STUDIES: I have personally reviewed the radiological images as listed and agreed with the findings in the report. NM PET Image Initial (PI) Skull Base To Thigh (F-18 FDG) Result Date: 07/31/2023 CLINICAL DATA:  Initial treatment strategy for liver mass. EXAM: NUCLEAR MEDICINE PET SKULL BASE TO THIGH initial TECHNIQUE: 7.2 mCi F-18 FDG was injected intravenously. Full-ring PET imaging was performed from the skull base to thigh after the radiotracer. CT data was obtained and used for attenuation correction and anatomic localization. Fasting blood glucose: 104 mg/dl COMPARISON:  Noncontrast CT 07/05/2023 FINDINGS: NECK: No hypermetabolic lymph nodes in the neck. Incidental CT findings: None. CHEST: No hypermetabolic mediastinal or hilar nodes. No suspicious pulmonary nodules on the CT scan. Incidental CT findings: Post LEFT mastectomy anatomy ABDOMEN/PELVIS: Multiple round intense hypermetabolic lesions in the central LEFT hepatic lobe SUV max equal 18.4. Several round peripherally hypermetabolic lesions in the medial aspect of the RIGHT hepatic lobe on image 81 with SUV max equal 13.3. thickened rim of the gallbladder wall is intensely hypermetabolic with SUV max equal 19.9. This hypermetabolic gallbladder wall thickening measures 22 mm on image 91/6 Hypermetabolic lymph nodes in the porta hepatis poorly defined on the CT portion exam but intensely hypermetabolic SUV max equal 17. Small hypermetabolic  nodule along the ventral peritoneal surface midline below the umbilicus and above the pubic symphysis measures 9 mm with SUV max 8.4 image 128. Second small hypermetabolic peritoneal nodule measures 3 mm (image 120). third hypermetabolic ventral peritoneal nodule midline measures 6 mm on image 122 Incidental CT findings: Large LEFT renal calculus within the LEFT renal hilum. No hydronephrosis SKELETON: No focal  hypermetabolic activity to suggest skeletal metastasis. Incidental CT findings: None. IMPRESSION: 1. Multiple intensely hypermetabolic lesions in the liver consistent with malignancy. Favor metastatic disease. 2. Hypermetabolic thickening of the gallbladder wall is most concerning for primary gallbladder carcinoma. 3. Hypermetabolic nodal metastasis in the porta hepatis. 4. Several small hypermetabolic nodules along the ventral peritoneal space are concerning for peritoneal metastasis. These results will be called to the ordering clinician or representative by the Radiologist Assistant, and communication documented in the PACS or [employer]. Electronically Signed   By: Genevive Bi M.D.   On: 07/31/2023 12:38   CT RENAL STONE STUDY Result Date: 07/18/2023 CLINICAL DATA:  Right flank pain EXAM: CT ABDOMEN AND PELVIS WITHOUT CONTRAST TECHNIQUE: Multidetector CT imaging of the abdomen and pelvis was performed following the standard protocol without IV contrast. RADIATION DOSE REDUCTION: This exam was performed according to the departmental dose-optimization program which includes automated exposure control, adjustment of the mA and/or kV according to patient size and/or use of iterative reconstruction technique. COMPARISON:  08/11/2011.  Plain film 06/27/2023 FINDINGS: Lower chest: No acute abnormality. Aortic atherosclerosis. Small hiatal hernia. Hepatobiliary: Several small scattered cysts in the liver. In addition, there are low-density areas within the liver which appear solid and concerning for possible metastases. One such mass measures 5.3 x 4.3 cm in the medial segment of the left hepatic lobe. Other similar solid-appearing lesions are seen in the right hepatic lobe measuring up to 2.6 cm. Gallstones noted within the gallbladder. In addition, there is abnormal irregular asymmetric wall thickening within the gallbladder measuring up to 2.4 cm in thickness. Given the asymmetry and irregularity, this  is concerning for possible gallbladder cancer. Pancreas: No focal abnormality or ductal dilatation. Spleen: No focal abnormality.  Normal size. Adrenals/Urinary Tract: Adrenal glands unremarkable. 1.3 cm left renal pelvic stone. There is mild left hydronephrosis and perinephric stranding. Ureters difficult to follow but appear no hydronephrosis on the right. Bilateral low-density lesions within the kidneys measuring up to 3 cm on the right and 1.7 cm on the left most likely cysts. Decompressed. Stomach/Bowel: Sigmoid diverticulosis. No active diverticulitis. Stomach and small bowel decompressed, unremarkable. Vascular/Lymphatic: Aortic atherosclerosis. No evidence of aneurysm or adenopathy. Reproductive: Prior hysterectomy. No adnexal masses. 2.5 cm cyst in the right ovary. Other: No free fluid or free air. Musculoskeletal: No acute bony abnormality. IMPRESSION: Cholelithiasis. Irregular asymmetric wall thickening within the posterior and fundal wall of the gallbladder concerning for gallbladder cancer. Irregular solid-appearing low-density masses within the liver concerning for metastases. 1.3 cm left renal pelvic stone with mild left hydronephrosis and perinephric stranding. Ureter is difficult to follow. No visible definitive ureteral stones. Sigmoid diverticulosis. Aortic atherosclerosis. These results will be called to the ordering clinician or representative by the Radiologist Assistant, and communication documented in the PACS or [employer]. Electronically Signed   By: Charlett Nose M.D.   On: 07/18/2023 08:52     Primary gall bladder adenocarcinoma (HCC) #NOV 2024- CT scan: Irregular asymmetric wall thickening within the posterior and fundal wall of the gallbladder concerning for gallbladder cancer; Irregular solid-appearing low-density masses within the liver concerning for metastases. PET  scan- DEC 15th, 2024- Multiple intensely hypermetabolic lesions in the liver consistent with malignancy.  Favor metastatic disease;  Hypermetabolic thickening of the gallbladder wall is most concerning for primary gallbladder carcinoma; Hypermetabolic nodal metastasis in the porta hepatis;  Several small hypermetabolic nodules along the ventral peritoneal space are concerning for peritoneal metastasis.  # I had a long discussion with the patient and family regarding the serious findings noted on the imaging-and concerning for gallbladder primary with metastatic disease to the liver.  Discussed that if proven malignancy -incurable disease.  The median life expectancy is about 1 year for gallbladder cancer.  Clinically no suggestive of breast cancer recurrence/metastasis.  # Discussed that at her age-chemotherapy is not an option/especially response rates are quite modest with high risk of side effects.  Discussed regarding immunotherapy/and targeted therapy as options.  Will need to have NGS testing. Discussed with Dr.Henn.   # After lengthy discussion patient and family-seem to be interested in biopsy; however have not made any decisions yet.  They will let us know.  Also discussed option of hospice-if patient chooses to forego biopsy/or any treatments.   # Stage I LEFT Breast cancer- 11/24/2020 -ER/PR positive HER2 negative s/p mastectomy [[facility]]- Declined adjuvant endocrine therapy.   # Mild right upper quadrant pain- sec to liver metastasis- intermittent; monitor for now.   # Genetics- NEG for BRCA- [as per family at [facility]; 2022]  Thank you Dr. Lorin Picket for allowing me to participate in the care of your pleasant patient. Please do not hesitate to contact me with questions or concerns in the interim.  Discussed with Dr. Lorin Picket; and also Ladora Daniel.  # I reviewed the blood work- with the patient in detail; also reviewed the imaging independently [as summarized above]; and with the patient in detail.   # DISPOSITION: # no labs-  # Follow up TBD- Dr.B   Above plan of care was discussed with  patient/family in detail.  My contact information was given to the patient/family.     Earna Coder, MD 08/01/2023 4:26 PM

## 2023-08-01 NOTE — Progress Notes (Signed)
Appetite 75% normal, drinks 4 boost per week. She does have some trouble swallowing solids and liquids.  Has urinary frequency, has been given rx by urology but  hasn't started.

## 2023-08-01 NOTE — Assessment & Plan Note (Addendum)
#  NOV 2024- CT scan: Irregular asymmetric wall thickening within the posterior and fundal wall of the gallbladder concerning for gallbladder cancer; Irregular solid-appearing low-density masses within the liver concerning for metastases. PET scan- DEC 15th, 2024- Multiple intensely hypermetabolic lesions in the liver consistent with malignancy. Favor metastatic disease;  Hypermetabolic thickening of the gallbladder wall is most concerning for primary gallbladder carcinoma; Hypermetabolic nodal metastasis in the porta hepatis;  Several small hypermetabolic nodules along the ventral peritoneal space are concerning for peritoneal metastasis.  # I had a long discussion with the patient and family regarding the serious findings noted on the imaging-and concerning for gallbladder primary with metastatic disease to the liver.  Discussed that if proven malignancy -incurable disease.  The median life expectancy is about 1 year for gallbladder cancer.  Clinically no suggestive of breast cancer recurrence/metastasis.  # Discussed that at her age-chemotherapy is not an option/especially response rates are quite modest with high risk of side effects.  Discussed regarding immunotherapy/and targeted therapy as options.  Will need to have NGS testing. Discussed with Dr.Henn.   # After lengthy discussion patient and family-seem to be interested in biopsy; however have not made any decisions yet.  They will let us know.  Also discussed option of hospice-if patient chooses to forego biopsy/or any treatments.   # Stage I LEFT Breast cancer- 11/24/2020 -ER/PR positive HER2 negative s/p mastectomy [[facility]]- Declined adjuvant endocrine therapy.   # Mild right upper quadrant pain- sec to liver metastasis- intermittent; monitor for now.   # Genetics- NEG for BRCA- [as per family at [facility]; 2022]  Thank you Dr. Lorin Picket for allowing me to participate in the care of your pleasant patient. Please do not hesitate to contact me with  questions or concerns in the interim.  Discussed with Dr. Lorin Picket; and also Ladora Daniel.  # I reviewed the blood work- with the patient in detail; also reviewed the imaging independently [as summarized above]; and with the patient in detail.   # DISPOSITION: # no labs-  # Follow up TBD- Dr.B

## 2023-08-01 NOTE — Telephone Encounter (Signed)
Has an appt with oncology (Dr Donneta Romberg) 08/01/23. Dr Donneta Romberg aware of results.

## 2023-08-04 ENCOUNTER — Other Ambulatory Visit: Payer: Self-pay

## 2023-08-04 DIAGNOSIS — C23 Malignant neoplasm of gallbladder: Secondary | ICD-10-CM

## 2023-08-04 DIAGNOSIS — H6123 Impacted cerumen, bilateral: Secondary | ICD-10-CM | POA: Diagnosis not present

## 2023-08-04 DIAGNOSIS — H903 Sensorineural hearing loss, bilateral: Secondary | ICD-10-CM | POA: Diagnosis not present

## 2023-08-04 NOTE — Progress Notes (Signed)
Spent time with the family answering questions regarding oncology care and treatment. Palliative care referral made.

## 2023-08-14 ENCOUNTER — Telehealth: Payer: Self-pay

## 2023-08-14 NOTE — Telephone Encounter (Signed)
Copied from CRM [id number]. Topic: Clinical - Request for Lab/Test Order >> Aug 14, 2023  2:01 PM Jennifer Bernard wrote: Reason for CRM: PT's daughter is calling in to see if er mom the PT will ned blood work prior to her physical appointment with Dr.Scott on 2/5

## 2023-08-14 NOTE — Telephone Encounter (Signed)
Cholesterol was checked 04/2023. Being followed by Dr B for metastatic cancer. Do you want labs prior to her appt in Feb with you?

## 2023-08-15 ENCOUNTER — Inpatient Hospital Stay: Payer: Medicare PPO | Admitting: Hospice and Palliative Medicine

## 2023-08-16 NOTE — Telephone Encounter (Signed)
 She would technically be due for fasting labs prior to appt, but we can wait and see if any further labs are drawn prior to that appt and can check labs at appt if needed.

## 2023-08-18 NOTE — Telephone Encounter (Signed)
 Called and spoke with Rosanne Ashing, son.  Relayed Dr. Roby Lofts message that labs can be drawn at her appointment on 09/20/23 @ 12:30p.  Rosanne Ashing verbalized understanding and did not have any other concerns.

## 2023-08-24 ENCOUNTER — Emergency Department: Payer: Medicare PPO

## 2023-08-24 ENCOUNTER — Ambulatory Visit: Payer: PRIVATE HEALTH INSURANCE | Admitting: Urology

## 2023-08-24 ENCOUNTER — Encounter (HOSPITAL_COMMUNITY): Payer: Self-pay

## 2023-08-24 ENCOUNTER — Encounter: Payer: Self-pay | Admitting: Internal Medicine

## 2023-08-24 ENCOUNTER — Encounter: Payer: Self-pay | Admitting: Emergency Medicine

## 2023-08-24 ENCOUNTER — Ambulatory Visit
Admission: RE | Admit: 2023-08-24 | Discharge: 2023-08-24 | Disposition: A | Discharge status: Home / Self Care | Payer: Medicare PPO | Source: Ambulatory Visit | Attending: Hospice and Palliative Medicine | Admitting: Hospice and Palliative Medicine

## 2023-08-24 ENCOUNTER — Inpatient Hospital Stay: Payer: Medicare PPO | Attending: Internal Medicine | Admitting: Hospice and Palliative Medicine

## 2023-08-24 ENCOUNTER — Telehealth: Payer: Self-pay

## 2023-08-24 ENCOUNTER — Inpatient Hospital Stay
Admission: EM | Admit: 2023-08-24 | Discharge: 2023-09-16 | DRG: 064 | Disposition: E | Payer: Medicare PPO | Attending: Family Medicine | Admitting: Family Medicine

## 2023-08-24 ENCOUNTER — Encounter: Payer: Self-pay | Admitting: Hospice and Palliative Medicine

## 2023-08-24 ENCOUNTER — Other Ambulatory Visit: Payer: Self-pay

## 2023-08-24 VITALS — BP 100/53 | HR 98 | Temp 97.6°F

## 2023-08-24 DIAGNOSIS — R9431 Abnormal electrocardiogram [ECG] [EKG]: Secondary | ICD-10-CM | POA: Diagnosis present

## 2023-08-24 DIAGNOSIS — F419 Anxiety disorder, unspecified: Secondary | ICD-10-CM | POA: Diagnosis present

## 2023-08-24 DIAGNOSIS — Z515 Encounter for palliative care: Secondary | ICD-10-CM | POA: Diagnosis not present

## 2023-08-24 DIAGNOSIS — R29810 Facial weakness: Secondary | ICD-10-CM | POA: Diagnosis present

## 2023-08-24 DIAGNOSIS — Z800 Family history of malignant neoplasm of digestive organs: Secondary | ICD-10-CM

## 2023-08-24 DIAGNOSIS — R059 Cough, unspecified: Secondary | ICD-10-CM | POA: Diagnosis not present

## 2023-08-24 DIAGNOSIS — Z66 Do not resuscitate: Secondary | ICD-10-CM | POA: Insufficient documentation

## 2023-08-24 DIAGNOSIS — E785 Hyperlipidemia, unspecified: Secondary | ICD-10-CM | POA: Diagnosis present

## 2023-08-24 DIAGNOSIS — N200 Calculus of kidney: Secondary | ICD-10-CM | POA: Insufficient documentation

## 2023-08-24 DIAGNOSIS — Z90710 Acquired absence of both cervix and uterus: Secondary | ICD-10-CM

## 2023-08-24 DIAGNOSIS — C23 Malignant neoplasm of gallbladder: Secondary | ICD-10-CM | POA: Diagnosis not present

## 2023-08-24 DIAGNOSIS — I77810 Thoracic aortic ectasia: Secondary | ICD-10-CM | POA: Diagnosis not present

## 2023-08-24 DIAGNOSIS — G4733 Obstructive sleep apnea (adult) (pediatric): Secondary | ICD-10-CM | POA: Diagnosis not present

## 2023-08-24 DIAGNOSIS — Z79899 Other long term (current) drug therapy: Secondary | ICD-10-CM

## 2023-08-24 DIAGNOSIS — I10 Essential (primary) hypertension: Secondary | ICD-10-CM | POA: Insufficient documentation

## 2023-08-24 DIAGNOSIS — I2699 Other pulmonary embolism without acute cor pulmonale: Secondary | ICD-10-CM | POA: Diagnosis not present

## 2023-08-24 DIAGNOSIS — Z853 Personal history of malignant neoplasm of breast: Secondary | ICD-10-CM

## 2023-08-24 DIAGNOSIS — D72829 Elevated white blood cell count, unspecified: Secondary | ICD-10-CM | POA: Diagnosis present

## 2023-08-24 DIAGNOSIS — I77819 Aortic ectasia, unspecified site: Secondary | ICD-10-CM | POA: Diagnosis not present

## 2023-08-24 DIAGNOSIS — R601 Generalized edema: Secondary | ICD-10-CM | POA: Diagnosis present

## 2023-08-24 DIAGNOSIS — Z9012 Acquired absence of left breast and nipple: Secondary | ICD-10-CM | POA: Insufficient documentation

## 2023-08-24 DIAGNOSIS — R471 Dysarthria and anarthria: Secondary | ICD-10-CM | POA: Diagnosis present

## 2023-08-24 DIAGNOSIS — I2609 Other pulmonary embolism with acute cor pulmonale: Secondary | ICD-10-CM | POA: Diagnosis not present

## 2023-08-24 DIAGNOSIS — Z79890 Hormone replacement therapy (postmenopausal): Secondary | ICD-10-CM

## 2023-08-24 DIAGNOSIS — I771 Stricture of artery: Secondary | ICD-10-CM | POA: Diagnosis not present

## 2023-08-24 DIAGNOSIS — F325 Major depressive disorder, single episode, in full remission: Secondary | ICD-10-CM | POA: Diagnosis not present

## 2023-08-24 DIAGNOSIS — I700 Atherosclerosis of aorta: Secondary | ICD-10-CM | POA: Diagnosis not present

## 2023-08-24 DIAGNOSIS — R069 Unspecified abnormalities of breathing: Secondary | ICD-10-CM | POA: Diagnosis not present

## 2023-08-24 DIAGNOSIS — D6869 Other thrombophilia: Secondary | ICD-10-CM | POA: Diagnosis present

## 2023-08-24 DIAGNOSIS — R4702 Dysphasia: Secondary | ICD-10-CM | POA: Diagnosis present

## 2023-08-24 DIAGNOSIS — G459 Transient cerebral ischemic attack, unspecified: Secondary | ICD-10-CM | POA: Diagnosis not present

## 2023-08-24 DIAGNOSIS — I63511 Cerebral infarction due to unspecified occlusion or stenosis of right middle cerebral artery: Secondary | ICD-10-CM | POA: Diagnosis not present

## 2023-08-24 DIAGNOSIS — G8194 Hemiplegia, unspecified affecting left nondominant side: Secondary | ICD-10-CM | POA: Diagnosis not present

## 2023-08-24 DIAGNOSIS — Z87440 Personal history of urinary (tract) infections: Secondary | ICD-10-CM | POA: Diagnosis not present

## 2023-08-24 DIAGNOSIS — R0902 Hypoxemia: Secondary | ICD-10-CM | POA: Diagnosis not present

## 2023-08-24 DIAGNOSIS — E039 Hypothyroidism, unspecified: Secondary | ICD-10-CM | POA: Diagnosis present

## 2023-08-24 DIAGNOSIS — C787 Secondary malignant neoplasm of liver and intrahepatic bile duct: Secondary | ICD-10-CM | POA: Diagnosis present

## 2023-08-24 DIAGNOSIS — F32A Depression, unspecified: Secondary | ICD-10-CM | POA: Insufficient documentation

## 2023-08-24 DIAGNOSIS — N179 Acute kidney failure, unspecified: Secondary | ICD-10-CM | POA: Diagnosis present

## 2023-08-24 DIAGNOSIS — J9601 Acute respiratory failure with hypoxia: Secondary | ICD-10-CM

## 2023-08-24 DIAGNOSIS — I119 Hypertensive heart disease without heart failure: Secondary | ICD-10-CM | POA: Insufficient documentation

## 2023-08-24 DIAGNOSIS — R414 Neurologic neglect syndrome: Secondary | ICD-10-CM | POA: Diagnosis present

## 2023-08-24 DIAGNOSIS — R7303 Prediabetes: Secondary | ICD-10-CM | POA: Diagnosis present

## 2023-08-24 DIAGNOSIS — Z880 Allergy status to penicillin: Secondary | ICD-10-CM | POA: Insufficient documentation

## 2023-08-24 DIAGNOSIS — Z85828 Personal history of other malignant neoplasm of skin: Secondary | ICD-10-CM

## 2023-08-24 DIAGNOSIS — R9082 White matter disease, unspecified: Secondary | ICD-10-CM | POA: Diagnosis not present

## 2023-08-24 DIAGNOSIS — K769 Liver disease, unspecified: Secondary | ICD-10-CM | POA: Diagnosis not present

## 2023-08-24 DIAGNOSIS — Z888 Allergy status to other drugs, medicaments and biological substances status: Secondary | ICD-10-CM

## 2023-08-24 DIAGNOSIS — R160 Hepatomegaly, not elsewhere classified: Secondary | ICD-10-CM | POA: Diagnosis not present

## 2023-08-24 DIAGNOSIS — R9389 Abnormal findings on diagnostic imaging of other specified body structures: Secondary | ICD-10-CM | POA: Diagnosis not present

## 2023-08-24 DIAGNOSIS — R0602 Shortness of breath: Secondary | ICD-10-CM | POA: Diagnosis not present

## 2023-08-24 DIAGNOSIS — Z803 Family history of malignant neoplasm of breast: Secondary | ICD-10-CM

## 2023-08-24 DIAGNOSIS — I639 Cerebral infarction, unspecified: Secondary | ICD-10-CM | POA: Diagnosis not present

## 2023-08-24 DIAGNOSIS — R0689 Other abnormalities of breathing: Secondary | ICD-10-CM | POA: Diagnosis not present

## 2023-08-24 DIAGNOSIS — K219 Gastro-esophageal reflux disease without esophagitis: Secondary | ICD-10-CM | POA: Insufficient documentation

## 2023-08-24 DIAGNOSIS — I517 Cardiomegaly: Secondary | ICD-10-CM | POA: Diagnosis not present

## 2023-08-24 DIAGNOSIS — Z881 Allergy status to other antibiotic agents status: Secondary | ICD-10-CM | POA: Diagnosis not present

## 2023-08-24 DIAGNOSIS — R29709 NIHSS score 9: Secondary | ICD-10-CM | POA: Diagnosis present

## 2023-08-24 DIAGNOSIS — Z808 Family history of malignant neoplasm of other organs or systems: Secondary | ICD-10-CM

## 2023-08-24 LAB — CBC
HCT: 37.5 % (ref 36.0–46.0)
Hemoglobin: 11.7 g/dL — ABNORMAL LOW (ref 12.0–15.0)
MCH: 26.4 pg (ref 26.0–34.0)
MCHC: 31.2 g/dL (ref 30.0–36.0)
MCV: 84.5 fL (ref 80.0–100.0)
Platelets: UNDETERMINED 10*3/uL (ref 150–400)
RBC: 4.44 MIL/uL (ref 3.87–5.11)
RDW: 15.8 % — ABNORMAL HIGH (ref 11.5–15.5)
WBC: 17.4 10*3/uL — ABNORMAL HIGH (ref 4.0–10.5)
nRBC: 0 % (ref 0.0–0.2)

## 2023-08-24 LAB — COMPREHENSIVE METABOLIC PANEL
ALT: 14 U/L (ref 0–44)
AST: 35 U/L (ref 15–41)
Albumin: 3.1 g/dL — ABNORMAL LOW (ref 3.5–5.0)
Alkaline Phosphatase: 101 U/L (ref 38–126)
Anion gap: 14 (ref 5–15)
BUN: 31 mg/dL — ABNORMAL HIGH (ref 8–23)
CO2: 20 mmol/L — ABNORMAL LOW (ref 22–32)
Calcium: 8.9 mg/dL (ref 8.9–10.3)
Chloride: 103 mmol/L (ref 98–111)
Creatinine, Ser: 1.16 mg/dL — ABNORMAL HIGH (ref 0.44–1.00)
GFR, Estimated: 44 mL/min — ABNORMAL LOW (ref >60)
Glucose, Bld: 157 mg/dL — ABNORMAL HIGH (ref 70–99)
Potassium: 4.3 mmol/L (ref 3.5–5.1)
Sodium: 137 mmol/L (ref 135–145)
Total Bilirubin: 1 mg/dL (ref 0.0–1.2)
Total Protein: 6.9 g/dL (ref 6.5–8.1)

## 2023-08-24 LAB — DIFFERENTIAL
Abs Immature Granulocytes: 0.11 10*3/uL — ABNORMAL HIGH (ref 0.00–0.07)
Basophils Absolute: 0.1 10*3/uL (ref 0.0–0.1)
Basophils Relative: 1 %
Eosinophils Absolute: 0.1 10*3/uL (ref 0.0–0.5)
Eosinophils Relative: 0 %
Immature Granulocytes: 1 %
Lymphocytes Relative: 10 %
Lymphs Abs: 1.8 10*3/uL (ref 0.7–4.0)
Monocytes Absolute: 1.9 10*3/uL — ABNORMAL HIGH (ref 0.1–1.0)
Monocytes Relative: 11 %
Neutro Abs: 13.4 10*3/uL — ABNORMAL HIGH (ref 1.7–7.7)
Neutrophils Relative %: 77 %

## 2023-08-24 LAB — MAGNESIUM: Magnesium: 2 mg/dL (ref 1.7–2.4)

## 2023-08-24 LAB — URINALYSIS, ROUTINE W REFLEX MICROSCOPIC
Bilirubin Urine: NEGATIVE
Glucose, UA: NEGATIVE mg/dL
Ketones, ur: NEGATIVE mg/dL
Nitrite: NEGATIVE
Protein, ur: 100 mg/dL — AB
RBC / HPF: >50 RBC/hpf (ref 0–5)
Specific Gravity, Urine: 1.026 (ref 1.005–1.030)
WBC, UA: >50 WBC/hpf (ref 0–5)
pH: 5 (ref 5.0–8.0)

## 2023-08-24 LAB — PROTIME-INR
INR: 1.3 — ABNORMAL HIGH (ref 0.8–1.2)
Prothrombin Time: 16.8 s — ABNORMAL HIGH (ref 11.4–15.2)

## 2023-08-24 LAB — TROPONIN I (HIGH SENSITIVITY)
Troponin I (High Sensitivity): 644 ng/L (ref <18)
Troponin I (High Sensitivity): 689 ng/L (ref <18)

## 2023-08-24 LAB — APTT: aPTT: 39 s — ABNORMAL HIGH (ref 24–36)

## 2023-08-24 LAB — BRAIN NATRIURETIC PEPTIDE: B Natriuretic Peptide: 716.8 pg/mL — ABNORMAL HIGH (ref 0.0–100.0)

## 2023-08-24 LAB — CBG MONITORING, ED: Glucose-Capillary: 126 mg/dL — ABNORMAL HIGH (ref 70–99)

## 2023-08-24 LAB — ETHANOL: Alcohol, Ethyl (B): <10 mg/dL (ref <10)

## 2023-08-24 MED ORDER — IOHEXOL 350 MG/ML SOLN
80.0000 mL | Freq: Once | INTRAVENOUS | Status: AC | PRN
  Administered 2023-08-24: 80 mL via INTRAVENOUS

## 2023-08-24 MED ORDER — ASPIRIN 81 MG PO TBEC: 81.0000 mg | DELAYED_RELEASE_TABLET | Freq: Every day | ORAL | Status: DC

## 2023-08-24 MED ORDER — STROKE: EARLY STAGES OF RECOVERY BOOK: Freq: Once | Status: DC

## 2023-08-24 MED ORDER — PANTOPRAZOLE SODIUM 40 MG PO TBEC: 40.0000 mg | DELAYED_RELEASE_TABLET | Freq: Every day | ORAL | Status: DC

## 2023-08-24 MED ORDER — HEPARIN BOLUS VIA INFUSION
3600.0000 [IU] | Freq: Once | INTRAVENOUS | Status: AC
  Administered 2023-08-24: 3600 [IU] via INTRAVENOUS
  Filled 2023-08-24: qty 3600

## 2023-08-24 MED ORDER — ACETAMINOPHEN 650 MG RE SUPP: 650.0000 mg | Freq: Four times a day (QID) | RECTAL | Status: DC | PRN

## 2023-08-24 MED ORDER — ACETAMINOPHEN 325 MG PO TABS: 650.0000 mg | ORAL_TABLET | Freq: Four times a day (QID) | ORAL | Status: DC | PRN

## 2023-08-24 MED ORDER — ONDANSETRON HCL 4 MG PO TABS: 4.0000 mg | ORAL_TABLET | Freq: Four times a day (QID) | ORAL | Status: DC | PRN

## 2023-08-24 MED ORDER — ONDANSETRON HCL 4 MG/2ML IJ SOLN: 4.0000 mg | Freq: Four times a day (QID) | INTRAMUSCULAR | Status: DC | PRN

## 2023-08-24 MED ORDER — CEFTRIAXONE SODIUM 1 G IJ SOLR
1.0000 g | Freq: Once | INTRAMUSCULAR | Status: AC
  Administered 2023-08-24: 1 g via INTRAVENOUS
  Filled 2023-08-24: qty 10

## 2023-08-24 MED ORDER — ESCITALOPRAM OXALATE 10 MG PO TABS: 10.0000 mg | ORAL_TABLET | Freq: Every day | ORAL | Status: DC

## 2023-08-24 MED ORDER — TRAZODONE HCL 50 MG PO TABS: 25.0000 mg | ORAL_TABLET | Freq: Every evening | ORAL | Status: DC | PRN

## 2023-08-24 MED ORDER — LORAZEPAM 2 MG/ML IJ SOLN
0.5000 mg | INTRAMUSCULAR | Status: DC | PRN
  Administered 2023-08-25 (×5): 0.5 mg via INTRAVENOUS
  Filled 2023-08-24 (×6): qty 1

## 2023-08-24 MED ORDER — PRAVASTATIN SODIUM 20 MG PO TABS: 40.0000 mg | ORAL_TABLET | Freq: Every day | ORAL | Status: DC

## 2023-08-24 MED ORDER — HEPARIN (PORCINE) 25000 UT/250ML-% IV SOLN
1000.0000 [IU]/h | INTRAVENOUS | Status: DC
  Administered 2023-08-24: 1000 [IU]/h via INTRAVENOUS
  Filled 2023-08-24: qty 250

## 2023-08-24 MED ORDER — SODIUM CHLORIDE 0.9% FLUSH
3.0000 mL | Freq: Once | INTRAVENOUS | Status: AC
  Administered 2023-08-24: 3 mL via INTRAVENOUS

## 2023-08-24 MED ORDER — VITAMIN B-12 1000 MCG PO TABS
1000.0000 ug | ORAL_TABLET | Freq: Every day | ORAL | Status: DC
Start: 2023-08-25 — End: 2023-08-25
  Filled 2023-08-24: qty 1

## 2023-08-24 MED ORDER — LEVOTHYROXINE SODIUM 50 MCG PO TABS: 75.0000 ug | ORAL_TABLET | Freq: Every day | ORAL | Status: DC

## 2023-08-24 MED ORDER — SODIUM CHLORIDE 0.9 % IV SOLN: INTRAVENOUS | Status: DC

## 2023-08-24 MED ORDER — AMLODIPINE BESYLATE 5 MG PO TABS: 5.0000 mg | ORAL_TABLET | Freq: Every day | ORAL | Status: DC

## 2023-08-24 MED ORDER — MORPHINE SULFATE (PF) 2 MG/ML IV SOLN
2.0000 mg | INTRAVENOUS | Status: DC | PRN
  Administered 2023-08-25 (×3): 2 mg via INTRAVENOUS
  Filled 2023-08-24 (×3): qty 1

## 2023-08-24 MED ORDER — MAGNESIUM HYDROXIDE 400 MG/5ML PO SUSP: 30.0000 mL | Freq: Every day | ORAL | Status: DC | PRN

## 2023-08-24 NOTE — Progress Notes (Signed)
 Code Stroke activated @ [phone] with the pt in CT.  Dr. Otelia Limes on camera at the same time.  NCCT completed.  Back to the ED @ 1845.  Awaiting the decision for TNK.     Josiah Lobo BSN, Occupational hygienist

## 2023-08-24 NOTE — Progress Notes (Signed)
 Palliative Medicine [facility] at [facility] [phone] [phone]   Name: Jennifer Bernard Date: 08/24/2023 MRN: [id number]  DOB: Feb 11, 1927  Patient Care Team: Glendia Shad, MD as PCP - General (Internal Medicine) Maurie Rayfield BIRCH, RN as Oncology Nurse Navigator Rennie Cindy SAUNDERS, MD as Consulting Physician (Oncology)    REASON FOR CONSULTATION: Jennifer Bernard is a 89 y.o. female with multiple medical problems including probable gallbladder cancer.  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  Patient also had hypermetabolic nodal metastasis.  She was felt not to be a candidate for systemic chemotherapy.  She was referred to palliative care to address goals.  SOCIAL HISTORY:     reports that she has never smoked. She has never used smokeless tobacco. She reports current alcohol use. She reports that she does not use drugs.  Patient is widowed.  Lives at the [place] [facility] [place].  Has a daughter and two sons who live nearby.  ADVANCE DIRECTIVES:  On file  CODE STATUS: DNR/DNI (DNR order signed on 08/24/2023  PAST MEDICAL HISTORY: Past Medical History:  Diagnosis Date   Asymptomatic menopausal state 02/20/1984   Overview:  Naturally occuring and asymptomatic Vaginal atrophy present on topical estrogen Overview:  Overview:  Naturally occuring and asymptomatic Vaginal atrophy present on topical estrogen   Basal cell carcinoma of skin 02/19/2013   Overview:  Basal cell on her nasolabial fold 2014 S/p Mohs by Dr. Bluford Followed by local dermatologist regularly Overview:  Overview:  Followed by Dr. Bluford   Breast cancer [facility])    Cyst of ovary 02/19/2009   Overview:  Ovarian cyst on right stable 2004-2010 Cyst ?  larger on CT 2012 Stable on follow up US  Followed by Dr. Elvie Pinal Overview:  Overview:  Ovarian cyst on right stable 2004-2010 Cyst ? larger on CT 2012 Stable on follow up US  Followed by Dr. Elvie Pinal   Depression, major, single episode, complete remission (HCC) 07/18/2017   Diffuse cystic mastopathy of breast 02/19/2013   Overview:  Mother with breast cancer later in life Two breast biopsies both with normal pathology Annual mammograms recommended, last 2016 Followed at the [facility]  Last Assessment & Plan:  Relevant Hx: Course: Daily Update: Today's Plan: Overview:  Overview:  Mother with breast cancer later in life Two breast biopsies both with normal pathology Annual mammograms recommended, last 201   Essential (primary) hypertension 02/19/2013   Overview:  Goal blood pressure < 135/85 On atenolol Overview:  Overview:  Goal blood pressure < 135/85 On atenolol   Family history of breast cancer 05/12/2022   Hyperlipidemia 02/19/2013   Overview:  Goal LDL < 130, last value 92 in 2017 On Pravachol  Overview:  Overview:  Goal LDL < 130, last value 82 in 2012 On Pravachol    Hypothyroidism 02/28/2012   Overview:  started on T4  TFT normal 2017 Followed by Dr. Adelita Overview:  Overview:  started on T4  TFT normal 2014 Followed by Dr. Adelita   Impaired glucose tolerance 02/20/2003   Overview:  Overview:  Glucose goal < 100, last value 94 in 2014 HgbA1c goal < 6.0, last value 5.3 in 2014 Urine microalbumin negative in 2014 On diet and exercise   Nontoxic multinodular goiter 03/06/2003   Overview:  Asymptomatic nodular thyroid  Stable US   [id number] Followed by Dr. Adelita Overview:  Overview:  Asymptomatic nodular thyroid  Stable US  2004-2014 Followed by Dr. Jelessof   OSA on CPAP 01/13/2015   Overview:  Sleep study sent to medical records:   Sleep efficiency 22%, latency 24 min, # awakenings 11, res desat N=10, index 17.8  Apnea 1 for 16 sec, hypopnea 16 for 18 sec, total sleep  time only 90 min so index = 11 She did not sleep much and the index puts her in mild category Repeat study a few days later when she slept 187 min her AHI = 4.6 which is normal, average O2 sat 94% I don't think    Osteoarthritis 02/20/2003   Overview:  Mild bilateral hands Right CMC Right hip Left knee s/p meniscus tear All symptomatically stable Overview:  Overview:  Mild bilateral hands Right CMC Right hip Left knee s/p meniscus tear All symptomatically stable   Osteoporosis 02/20/2003   Overview:  Bone density 2004 LS -3.32 and RH-1.62 Bone density 2008 LS -3.14 and RH -1.22 Bone density 2009 LS -3.17 and RH -1.21 Bone density 2010 LS -3.14 and RH -1.42  Bone density 2014 LS -2.7 (L3 -3.1) and RH -1.5 Bone density 2017 LS -2.3 and RH -1.4 (neck -1.7) Vitamin D  level 38 in 2012 On Boniva 2004 to 2010, restarted 2015 Repeat bone density in 2020 and stop Boniva  Last Assessment & Pl   Prediabetes 02/20/2003   Overview:  Glucose goal < 100, last value 91 in 2017 HgbA1c goal < 6.0, last value 5.6 in 2017 Urine microalbumin negative in 2017 On diet and exercise   Recurrent UTI 10/19/2014   Squamous cell carcinoma of skin 03/14/2019   Left mid brow. SCCis, hypertrophic. EDC    PAST SURGICAL HISTORY:  Past Surgical History:  Procedure Laterality Date   ABDOMINAL HYSTERECTOMY  1975   MASTECTOMY Left    URETEROSCOPY     URETEROSCOPY WITH HOLMIUM LASER LITHOTRIPSY      HEMATOLOGY/ONCOLOGY HISTORY:  Oncology History  Primary gall bladder adenocarcinoma (HCC)  08/01/2023 Initial Diagnosis   Primary gall bladder adenocarcinoma (HCC)   08/01/2023 Cancer Staging   Staging form: Gallbladder, AJCC 8th Edition - Clinical: Stage IVB (cT3, cN2, cM1) - Signed by Rennie Cindy SAUNDERS, MD on 08/01/2023     ALLERGIES:  is allergic to penicillins, ceftin  [cefuroxime  axetil], and ramipril.  MEDICATIONS:  Current Outpatient Medications  Medication Sig Dispense Refill   amLODipine  (NORVASC ) 5 MG tablet  TAKE 1 TABLET BY MOUTH DAILY 90 tablet 1   Cyanocobalamin  (B-12) 1000 MCG TABS TAKE 1 TABLET BY MOUTH DAILY. 100 tablet 1   escitalopram  (LEXAPRO ) 10 MG tablet TAKE 1 TABLET BY MOUTH DAILY (FOR MOOD/DEPRESSION) 30 tablet 5   levothyroxine  (SYNTHROID , LEVOTHROID) 75 MCG tablet      pantoprazole  (PROTONIX ) 40 MG tablet TAKE 1 TABLET BY MOUTH 30 MINUTES BEFOREEVENING MEAL 90 tablet 3   pravastatin  (PRAVACHOL ) 40 MG tablet TAKE ONE TABLET EACH EVENING 90 tablet 3   fesoterodine  (TOVIAZ ) 4 MG TB24 tablet Take 1 tablet (4 mg total) by mouth daily. (Patient not taking: Reported on 08/24/2023) 30 tablet 3   mupirocin  ointment (BACTROBAN ) 2 % Apply 1 Application topically 2 (two) times daily. (Patient not taking: Reported on 08/24/2023) 22 g 0   triamcinolone  cream (KENALOG ) 0.1 % Apply 1 Application topically 2 (two) times daily. Apply to affected area on arm bid x 1 week. (Patient not taking: Reported on 08/24/2023) 30 g 0   No current facility-administered  medications for this visit.    VITAL SIGNS: BP (!) 100/53 (BP Location: Left Arm, Patient Position: Sitting)   Pulse 98   Temp 97.6 F (36.4 C) (Tympanic)  Filed Weights    Estimated body mass index is 24.53 kg/m as calculated from the following:   Height as of 08/01/23: 5' 2 (1.575 m).   Weight as of 08/01/23: 134 lb 1.6 oz (60.8 kg).  LABS: CBC:    Component Value Date/Time   WBC 10.3 07/19/2023 1409   HGB 12.7 07/19/2023 1409   HCT 39.3 07/19/2023 1409   PLT 353.0 07/19/2023 1409   MCV 86.4 07/19/2023 1409   NEUTROABS 7.1 07/19/2023 1409   LYMPHSABS 1.5 07/19/2023 1409   MONOABS 1.5 (H) 07/19/2023 1409   EOSABS 0.1 07/19/2023 1409   BASOSABS 0.1 07/19/2023 1409   Comprehensive Metabolic Panel:    Component Value Date/Time   NA 140 07/19/2023 1409   K 4.4 07/19/2023 1409   CL 104 07/19/2023 1409   CO2 27 07/19/2023 1409   BUN 16 07/19/2023 1409   CREATININE 0.70 07/19/2023 1409   GLUCOSE 89 07/19/2023 1409   CALCIUM 9.0  07/19/2023 1409   AST 19 07/19/2023 1409   ALT 9 07/19/2023 1409   ALKPHOS 100 07/19/2023 1409   BILITOT 0.7 07/19/2023 1409   PROT 6.5 07/19/2023 1409   PROT 6.5 01/28/2020 0801   ALBUMIN 3.7 07/19/2023 1409    RADIOGRAPHIC STUDIES: DG Chest 2 View Result Date: 08/24/2023 CLINICAL DATA:  Cough.  Gallbladder carcinoma. EXAM: CHEST - 2 VIEW COMPARISON:  04/10/2020 FINDINGS: Mild cardiomegaly and ectasia of thoracic aorta noted. Mild scarring is seen in the left lung base The lungs are otherwise clear. Numerous surgical clips seen in the left anterior chest wall soft tissues. IMPRESSION: Mild cardiomegaly.  No active lung disease. Electronically Signed   By: Norleen DELENA Kil M.D.   On: 08/24/2023 11:36   NM PET Image Initial (PI) Skull Base To Thigh (F-18 FDG) Result Date: 07/31/2023 CLINICAL DATA:  Initial treatment strategy for liver mass. EXAM: NUCLEAR MEDICINE PET SKULL BASE TO THIGH initial TECHNIQUE: 7.2 mCi F-18 FDG was injected intravenously. Full-ring PET imaging was performed from the skull base to thigh after the radiotracer. CT data was obtained and used for attenuation correction and anatomic localization. Fasting blood glucose: 104 mg/dl COMPARISON:  Noncontrast CT 07/05/2023 FINDINGS: NECK: No hypermetabolic lymph nodes in the neck. Incidental CT findings: None. CHEST: No hypermetabolic mediastinal or hilar nodes. No suspicious pulmonary nodules on the CT scan. Incidental CT findings: Post LEFT mastectomy anatomy ABDOMEN/PELVIS: Multiple round intense hypermetabolic lesions in the central LEFT hepatic lobe SUV max equal 18.4. Several round peripherally hypermetabolic lesions in the medial aspect of the RIGHT hepatic lobe on image 81 with SUV max equal 13.3. thickened rim of the gallbladder wall is intensely hypermetabolic with SUV max equal 19.9. This hypermetabolic gallbladder wall thickening measures 22 mm on image 91/6 Hypermetabolic lymph nodes in the porta hepatis poorly defined on the  CT portion exam but intensely hypermetabolic SUV max equal 17. Small hypermetabolic nodule along the ventral peritoneal surface midline below the umbilicus and above the pubic symphysis measures 9 mm with SUV max 8.4 image 128. Second small hypermetabolic peritoneal nodule measures 3 mm (image 120). third hypermetabolic ventral peritoneal nodule midline measures 6 mm on image 122 Incidental CT findings: Large LEFT renal calculus within the LEFT renal hilum. No hydronephrosis SKELETON: No focal hypermetabolic activity to suggest skeletal metastasis. Incidental CT findings: None.  IMPRESSION: 1. Multiple intensely hypermetabolic lesions in the liver consistent with malignancy. Favor metastatic disease. 2. Hypermetabolic thickening of the gallbladder wall is most concerning for primary gallbladder carcinoma. 3. Hypermetabolic nodal metastasis in the porta hepatis. 4. Several small hypermetabolic nodules along the ventral peritoneal space are concerning for peritoneal metastasis. These results will be called to the ordering clinician or representative by the Radiologist Assistant, and communication documented in the PACS or [employer]. Electronically Signed   By: Jackquline Boxer M.D.   On: 07/31/2023 12:38    PERFORMANCE STATUS (ECOG) : 2 - Symptomatic, <50% confined to bed  Review of Systems Unless otherwise noted, a complete review of systems is negative.  Physical Exam General: NAD Cardiovascular: regular rate and rhythm Pulmonary: clear ant fields Abdomen: soft, nontender, + bowel sounds GU: no suprapubic tenderness Extremities: no edema, no joint deformities Skin: no rashes Neurological: Weakness but otherwise nonfocal  IMPRESSION: Workup including PET scan concerning for stage IV gallbladder carcinoma.  Patient was seen in consultation by medical oncology and felt not likely to be a candidate for systemic chemotherapy given her advanced age and high likelihood of toxicities from treatment.   Biopsy was offered and possible targeted therapies versus single agent immunotherapy were discussed.  Today, I met with patient and daughter.  Several family dissipated in the conversation via phone including both of patient's sons.  Patient has limited recollection of her conversation with Dr. Rennie.  Together, we rereviewed the workup consistent with advanced stage cancer.  Family have discussed and they are not interested in pursuing biopsy or additional workup.  Instead, they would like to focus on keeping patient comfortable at home.  Patient and family are interested in hospice involvement, which I will coordinate.  Discussed CODE STATUS.  Patient stated clearly that she would not want to be resuscitated nor have her life prolonged artificially on machines.  I signed a DNR order for her to take home.  Family had questions about reducing pill burden.  Patient has had some difficulty recently managing her own medications at home.  We reviewed her medications in detail and I made some recommendations on medications that could likely be eliminated to reduce.  Family plan to also speak with PCP.  Symptomatically, patient endorses occasional pain but it is not severe or persistent.  She has had worsening cough and congestion over the past 1 to 2 weeks.  There have also been sick contacts within the family with similar symptoms.  Patient sent for chest x-ray which did not show any acute cardiopulmonary changes.  Recommend treating symptomatically.  PLAN: -Best supportive care -Referral to hospice -DNR/DNI -RTC as needed  Case and plan discussed with Dr. For Monday   Patient expressed understanding and was in agreement with this plan. She also understands that She can call the clinic at any time with any questions, concerns, or complaints.     Time Total: 45 minutes  Visit consisted of counseling and education dealing with the complex and emotionally intense issues of symptom  management and palliative care in the setting of serious and potentially life-threatening illness.Greater than 50%  of this time was spent counseling and coordinating care related to the above assessment and plan.  Signed by: Fonda Mower, PhD, NP-C

## 2023-08-24 NOTE — Progress Notes (Signed)
 Pt in for consult for palliative care with Elouise Munroe NP.

## 2023-08-24 NOTE — Progress Notes (Signed)
 Spoke with Dr. Willo by phone regarding CTA results.  He has spoken with Dr. Jerrie at [facility] and they are reaching out to IR to see if the pt is a thrombectomy candidate.  No other needs from [facility] at this time per Dr. Willo.  Channing Mac BSN, Occupational Hygienist

## 2023-08-24 NOTE — Telephone Encounter (Signed)
 With her history and increased cough - sounds like she needs to be evaluated - to confirm what treating.

## 2023-08-24 NOTE — ED Notes (Signed)
 Patient transported to CT

## 2023-08-24 NOTE — Consult Note (Signed)
 Pharmacy Consult Note - Anticoagulation  Pharmacy Consult for heparin  Indication: pulmonary embolus  PATIENT MEASUREMENTS: Height: 5' 2 (157.5 cm) Weight: 60.8 kg (134 lb 0.6 oz) IBW/kg (Calculated) : 50.1 HEPARIN  DW (KG): 60.8  VITAL SIGNS: Temp: 98.3 F (36.8 C) (01/09 1852) Temp Source: Axillary (01/09 1852) BP: 129/81 (01/09 1852) Pulse Rate: 97 (01/09 1852)  Recent Labs    08/24/23 1851  HGB 11.7*  HCT 37.5  PLT PLATELET CLUMPS NOTED ON SMEAR, UNABLE TO ESTIMATE  APTT 39*  LABPROT 16.8*  INR 1.3*  CREATININE 1.16*  TROPONINIHS 644*    Estimated Creatinine Clearance: 25.5 mL/min (A) (by C-G formula based on SCr of 1.16 mg/dL (H)).  PAST MEDICAL HISTORY: Past Medical History:  Diagnosis Date   Asymptomatic menopausal state 02/20/1984   Overview:  Naturally occuring and asymptomatic Vaginal atrophy present on topical estrogen Overview:  Overview:  Naturally occuring and asymptomatic Vaginal atrophy present on topical estrogen   Basal cell carcinoma of skin 02/19/2013   Overview:  Basal cell on her nasolabial fold 2014 S/p Mohs by Dr. Bluford Followed by local dermatologist regularly Overview:  Overview:  Followed by Dr. Bluford   Breast cancer [facility])    Cyst of ovary 02/19/2009   Overview:  Ovarian cyst on right stable 2004-2010 Cyst ? larger on CT 2012 Stable on follow up US  Followed by Dr. Elvie Pinal Overview:  Overview:  Ovarian cyst on right stable 2004-2010 Cyst ? larger on CT 2012 Stable on follow up US  Followed by Dr. Elvie Pinal   Depression, major, single episode, complete remission (HCC) 07/18/2017   Diffuse cystic mastopathy of breast 02/19/2013   Overview:  Mother with breast cancer later in life Two breast biopsies both with normal pathology Annual mammograms recommended, last 2016 Followed at the [facility]  Last Assessment & Plan:  Relevant Hx: Course: Daily Update: Today's Plan: Overview:  Overview:  Mother with breast cancer later in life  Two breast biopsies both with normal pathology Annual mammograms recommended, last 201   Essential (primary) hypertension 02/19/2013   Overview:  Goal blood pressure < 135/85 On atenolol Overview:  Overview:  Goal blood pressure < 135/85 On atenolol   Family history of breast cancer 05/12/2022   Hyperlipidemia 02/19/2013   Overview:  Goal LDL < 130, last value 92 in 2017 On Pravachol  Overview:  Overview:  Goal LDL < 130, last value 82 in 2012 On Pravachol    Hypothyroidism 02/28/2012   Overview:  started on T4  TFT normal 2017 Followed by Dr. Adelita Overview:  Overview:  started on T4  TFT normal 2014 Followed by Dr. Jelessof   Impaired glucose tolerance 02/20/2003   Overview:  Overview:  Glucose goal < 100, last value 94 in 2014 HgbA1c goal < 6.0, last value 5.3 in 2014 Urine microalbumin negative in 2014 On diet and exercise   Nontoxic multinodular goiter 03/06/2003   Overview:  Asymptomatic nodular thyroid  Stable US  2004-2014 Followed by Dr. Adelita Overview:  Overview:  Asymptomatic nodular thyroid  Stable US  2004-2014 Followed by Dr. Jelessof   OSA on CPAP 01/13/2015   Overview:  Sleep study sent to medical records:   Sleep efficiency 22%, latency 24 min, # awakenings 11, res desat N=10, index 17.8  Apnea 1 for 16 sec, hypopnea 16 for 18 sec, total sleep time only 90 min so index = 11 She did not sleep much and the index puts her in mild category Repeat study a few days later when she slept 187  min her AHI = 4.6 which is normal, average O2 sat 94% I don't think    Osteoarthritis 02/20/2003   Overview:  Mild bilateral hands Right CMC Right hip Left knee s/p meniscus tear All symptomatically stable Overview:  Overview:  Mild bilateral hands Right CMC Right hip Left knee s/p meniscus tear All symptomatically stable   Osteoporosis 02/20/2003   Overview:  Bone density 2004 LS -3.32 and RH-1.62 Bone density 2008 LS -3.14 and RH -1.22 Bone density 2009 LS -3.17 and RH -1.21 Bone density 2010 LS -3.14  and RH -1.42  Bone density 2014 LS -2.7 (L3 -3.1) and RH -1.5 Bone density 2017 LS -2.3 and RH -1.4 (neck -1.7) Vitamin D  level 38 in 2012 On Boniva 2004 to 2010, restarted 2015 Repeat bone density in 2020 and stop Boniva  Last Assessment & Pl   Prediabetes 02/20/2003   Overview:  Glucose goal < 100, last value 91 in 2017 HgbA1c goal < 6.0, last value 5.6 in 2017 Urine microalbumin negative in 2017 On diet and exercise   Recurrent UTI 10/19/2014   Squamous cell carcinoma of skin 03/14/2019   Left mid brow. SCCis, hypertrophic. EDC    ASSESSMENT: 89 y.o. female with PMH including probable gallbladder cancer is presenting with pulmonary embolism. Chest CT exhibits acute PE involving all lobes with evidence of right heart strain. Patient is not on chronic anticoagulation per chart review. Pharmacy has been consulted to initiate and manage heparin  intravenous infusion.  Pertinent medications: No anticoag PTA per chart review  Goal(s) of therapy: Heparin  level 0.3 - 0.7 units/mL Monitor platelets by anticoagulation protocol: Yes   Baseline anticoagulation labs: Recent Labs    08/24/23 1851  APTT 39*  INR 1.3*  HGB 11.7*  PLT PLATELET CLUMPS NOTED ON SMEAR, UNABLE TO ESTIMATE    Date Time aPTT/HL Rate/Comment     PLAN: Give 3600 units bolus x1; then start heparin  infusion at 1000 units/hour. Check heparin  level in 8 hours, then daily once at least two levels are consecutively therapeutic. Monitor CBC daily while on heparin  infusion.   Will M. Lenon, PharmD Clinical Pharmacist 08/24/2023 9:16 PM

## 2023-08-24 NOTE — Telephone Encounter (Signed)
 Copied from CRM [id number]. Topic: Referral - Question >> Aug 24, 2023  3:58 PM Deaijah H wrote: Reason for CRM: [facility] called in due to receiving hospice referral stated daughter is requesting Dr. Glendia to serve as hospice attending or patient / call back # for [facility] [phone]

## 2023-08-24 NOTE — ED Notes (Signed)
 Dr. Larinda Buttery notified of critical troponin of 644

## 2023-08-24 NOTE — ED Triage Notes (Signed)
 Patient presents to ED via [facility] from home for stroke like symptoms. LKW 1715.  Pt noted to have slurred speech and LEFT side facial droop. Slurred speech worsened en route to hospital.   Code Stroke was called prior to EMS arrival and patient taken straight to CT.  CBG on arrival 126

## 2023-08-24 NOTE — Consult Note (Addendum)
 NEUROLOGY CONSULT NOTE   Date of service: August 24, 2023 Patient Name: Jennifer Bernard MRN:  [id number] DOB:  August 02, 1927 Chief Complaint: Acute onset of slurred speech and left facial droop Requesting Provider: No att. providers found  History of Present Illness  Jennifer Bernard is a 89 y.o. 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 Per EMS, they were called to her home for slurred speech. Her symptoms had gotten worse by the time of EMS arrival to her home. Facial droop was noted by EMS in addition to her slurred speech. Code Stroke was called while transporting her to the ambulance. She continued to worsen en route. On arrival to the ED, she is severely dysarthric with significant weakness of the left upper and lower quadrants of her face.    LKW: 5:15 PM Modified rankin score: 0-Completely asymptomatic and back to baseline post- stroke IV Thrombolysis:  No; family declines TNK after extensive discussion of risks/benefits EVT:  No; risks of thrombectomy significantly outweigh potential benefits in this extremely fragile elderly female with metastatic cancer and PE   NIHSS components Score: Comment  1a Level of Conscious 0[x]  1[]  2[]  3[]      1b LOC Questions 0[x]  1[]  2[]       1c LOC Commands 0[x]  1[]  2[]       2 Best Gaze 0[]  1[x]  2[]       Can cross midline to left but hesitant and with right sided gaze preference  3 Visual 0[x]  1[]  2[]  3[]      4 Facial Palsy 0[]  1[]  2[]  3[x]      Prominent weakness upper and  lower quadrants on the left  5a Motor Arm - left 0[]  1[x]  2[]  3[]  4[]  UN[]   Slight drift  5b Motor Arm - Right 0[x]  1[]  2[]  3[]  4[]  UN[]    6a Motor Leg - Left 0[]  1[x]  2[]  3[]  4[]  UN[]   Slight drift  6b Motor Leg - Right 0[x]  1[]  2[]  3[]  4[]  UN[]    7 Limb Ataxia 0[x]  1[]  2[]  3[]  UN[]     8 Sensory 0[x]  1[]  2[]  UN[]      9 Best Language 0[x]  1[]  2[]  3[]      10 Dysarthria 0[]  1[]  2[x]  UN[]      Severe dysarthria  11 Extinct. and Inattention 0[]  1[x]  2[]       Has mild left sided neglect  TOTAL:   9      ROS  As per HPI. Detailed ROS deferred in the context of acuity of presentation.   Past History   Past Medical History:  Diagnosis Date   Asymptomatic menopausal state 02/20/1984   Overview:  Naturally occuring and asymptomatic Vaginal atrophy present on topical estrogen Overview:  Overview:  Naturally occuring and asymptomatic Vaginal atrophy present on topical estrogen   Basal cell carcinoma of skin 02/19/2013   Overview:  Basal cell on her nasolabial fold 2014 S/p Mohs by Dr. Bluford Followed by local dermatologist regularly Overview:  Overview:  Followed by Dr. Bluford   Breast cancer [facility])    Cyst of ovary 02/19/2009   Overview:  Ovarian cyst on right stable 2004-2010  Cyst ? larger on CT 2012 Stable on follow up US  Followed by Dr. Elvie Pinal Overview:  Overview:  Ovarian cyst on right stable 2004-2010 Cyst ? larger on CT 2012 Stable on follow up US  Followed by Dr. Elvie Pinal   Depression, major, single episode, complete remission (HCC) 07/18/2017   Diffuse cystic mastopathy of breast 02/19/2013   Overview:  Mother with breast cancer later in life Two breast biopsies both with normal pathology Annual mammograms recommended, last 2016 Followed at [facility]  Last Assessment & Plan:  Relevant Hx: Course: Daily Update: Today's Plan: Overview:  Overview:  Mother with breast cancer later in life Two breast biopsies both with normal pathology Annual mammograms recommended, last  201   Essential (primary) hypertension 02/19/2013   Overview:  Goal blood pressure < 135/85 On atenolol Overview:  Overview:  Goal blood pressure < 135/85 On atenolol   Family history of breast cancer 05/12/2022   Hyperlipidemia 02/19/2013   Overview:  Goal LDL < 130, last value 92 in 2017 On Pravachol  Overview:  Overview:  Goal LDL < 130, last value 82 in 2012 On Pravachol    Hypothyroidism 02/28/2012   Overview:  started on T4  TFT normal 2017 Followed by Dr. Adelita Overview:  Overview:  started on T4  TFT normal 2014 Followed by Dr. Adelita   Impaired glucose tolerance 02/20/2003   Overview:  Overview:  Glucose goal < 100, last value 94 in 2014 HgbA1c goal < 6.0, last value 5.3 in 2014 Urine microalbumin negative in 2014 On diet and exercise   Nontoxic multinodular goiter 03/06/2003   Overview:  Asymptomatic nodular thyroid  Stable US  2004-2014 Followed by Dr. Adelita Overview:  Overview:  Asymptomatic nodular thyroid  Stable US  2004-2014 Followed by Dr. Jelessof   OSA on CPAP 01/13/2015   Overview:  Sleep study sent to medical records:   Sleep efficiency 22%, latency 24 min, # awakenings 11, res desat N=10, index 17.8  Apnea 1 for 16 sec, hypopnea 16 for 18 sec, total sleep time only 90 min so index = 11 She did not sleep much and the index puts her in mild category Repeat study a few days later when she slept 187 min her AHI = 4.6 which is normal, average O2 sat 94% I don't think    Osteoarthritis 02/20/2003   Overview:  Mild bilateral hands Right CMC Right hip Left knee s/p meniscus tear All symptomatically stable Overview:  Overview:  Mild bilateral hands Right CMC Right hip Left knee s/p meniscus tear All symptomatically stable   Osteoporosis 02/20/2003   Overview:  Bone density 2004 LS -3.32 and RH-1.62 Bone density 2008 LS -3.14 and RH -1.22 Bone density 2009 LS -3.17 and RH -1.21 Bone density 2010 LS -3.14 and RH -1.42  Bone density 2014 LS -2.7 (L3 -3.1) and RH -1.5 Bone density 2017 LS  -2.3 and RH -1.4 (neck -1.7) Vitamin D  level 38 in 2012 On Boniva 2004 to 2010, restarted 2015 Repeat bone density in 2020 and stop Boniva  Last Assessment & Pl   Prediabetes 02/20/2003   Overview:  Glucose goal < 100, last value 91 in 2017 HgbA1c goal < 6.0, last value 5.6 in 2017 Urine microalbumin negative in 2017 On diet and exercise   Recurrent UTI 10/19/2014   Squamous cell carcinoma of skin 03/14/2019   Left mid brow. SCCis, hypertrophic. EDC    Past Surgical History:  Procedure Laterality Date   ABDOMINAL HYSTERECTOMY  1975   MASTECTOMY Left  URETEROSCOPY     URETEROSCOPY WITH HOLMIUM LASER LITHOTRIPSY      Family History: Family History  Problem Relation Age of Onset   Breast cancer Mother        dx late 42s   Skin cancer Father        non-melanoma; head   Liver cancer Sister        or other primary: d. 67   Breast cancer Maternal Grandmother        dx late 64s   Skin cancer Daughter        non-melanoma    Social History  reports that she has never smoked. She has never used smokeless tobacco. She reports current alcohol use. She reports that she does not use drugs.  Allergies  Allergen Reactions   Penicillins Hives    Other reaction(s): Unknown   Ceftin  [Cefuroxime  Axetil] Diarrhea    GI distress   Ramipril     Other reaction(s): Cough Other reaction(s): Cough     Medications   Current Facility-Administered Medications:    sodium chloride  flush (NS) 0.9 % injection 3 mL, 3 mL, Intravenous, Once, Quale, Mark, MD  Current Outpatient Medications:    amLODipine  (NORVASC ) 5 MG tablet, TAKE 1 TABLET BY MOUTH DAILY, Disp: 90 tablet, Rfl: 1   Cyanocobalamin  (B-12) 1000 MCG TABS, TAKE 1 TABLET BY MOUTH DAILY., Disp: 100 tablet, Rfl: 1   escitalopram  (LEXAPRO ) 10 MG tablet, TAKE 1 TABLET BY MOUTH DAILY (FOR MOOD/DEPRESSION), Disp: 30 tablet, Rfl: 5   fesoterodine  (TOVIAZ ) 4 MG TB24 tablet, Take 1 tablet (4 mg total) by mouth daily. (Patient not taking:  Reported on 08/24/2023), Disp: 30 tablet, Rfl: 3   levothyroxine  (SYNTHROID , LEVOTHROID) 75 MCG tablet, , Disp: , Rfl:    mupirocin  ointment (BACTROBAN ) 2 %, Apply 1 Application topically 2 (two) times daily. (Patient not taking: Reported on 08/24/2023), Disp: 22 g, Rfl: 0   pantoprazole  (PROTONIX ) 40 MG tablet, TAKE 1 TABLET BY MOUTH 30 MINUTES BEFOREEVENING MEAL, Disp: 90 tablet, Rfl: 3   pravastatin  (PRAVACHOL ) 40 MG tablet, TAKE ONE TABLET EACH EVENING, Disp: 90 tablet, Rfl: 3   triamcinolone  cream (KENALOG ) 0.1 %, Apply 1 Application topically 2 (two) times daily. Apply to affected area on arm bid x 1 week. (Patient not taking: Reported on 08/24/2023), Disp: 30 g, Rfl: 0  Vitals  There were no vitals filed for this visit.  There is no height or weight on file to calculate BMI.  Physical Exam   Constitutional: Frail-appearing elderly female HEENT: Atraumatic Respiratory: Shortness of breath Skin: No rash to arms, neck or face  Neurologic Examination  See annotated NIHSS above   Labs/Imaging/Neurodiagnostic studies   CBC: No results for input(s): WBC, NEUTROABS, HGB, HCT, MCV, PLT in the last 168 hours. Basic Metabolic Panel:  Lab Results  Component Value Date   NA 140 07/19/2023   K 4.4 07/19/2023   CO2 27 07/19/2023   GLUCOSE 89 07/19/2023   BUN 16 07/19/2023   CREATININE 0.70 07/19/2023   CALCIUM 9.0 07/19/2023   GFRNONAA >60 07/25/2022   GFRAA >60 10/02/2019   Lipid Panel:  Lab Results  Component Value Date   LDLCALC 65 04/20/2023   HgbA1c:  Lab Results  Component Value Date   HGBA1C 5.8 04/20/2023   Urine Drug Screen: No results found for: LABOPIA, COCAINSCRNUR, LABBENZ, AMPHETMU, THCU, LABBARB  Alcohol Level No results found for: ETH INR No results found for: INR APTT No results found for: APTT AED  levels: No results found for: PHENYTOIN, ZONISAMIDE, LAMOTRIGINE, LEVETIRACETA    ASSESSMENT  89 year old female with a  complex PMHx as documented above, including a new diagnosis of 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 NIHSS 9.  - Overall exam findings best localize to the right parietal lobe, most likely secondary to an acute ischemic stroke - CT head: No acute intracranial process. ASPECTS is 10.  - After comprehensive review of possible contraindications, she has no absolute contraindications to TNK administration.  - Patient is an IV thrombolysis candidate. Discussed extensively the risks/benefits of IV thrombolysis treatment vs. no treatment with the patient and her family, including risks of hemorrhage and death with IV thrombolysis administration versus worse overall outcomes on average in patients within thrombolysis time window who are not administered TNK. Overall benefits of TNK regarding long-term prognosis are equivocal when carefully balanced with the risks. The patient and her family expressed understanding and wish not to proceed with TNK.  - CTA of head and neck: Acute proximal right M2 branch occlusion. Focal severe proximal right P2 stenosis. Few scattered filling defects within the proximal segmental pulmonary arteries bilaterally, consistent with acute pulmonary emboli. Question 1.4 cm polypoid soft tissue lesion positioned at the central/posterior aspect of the upper subglottic airway. Correlation with direct visualization/bronchoscopy suggested for further evaluation as warranted. Aortic atherosclerosis - Risks of thrombectomy significantly outweigh potential benefits in this extremely fragile elderly female with metastatic cancer and PE  RECOMMENDATIONS  - Code status is DNR per Palliative Care note from today.  - Large PE seen on CTA chest. EDP has  made the decision to start IV heparin . Neurology is in agreement. From a Neurological standpoint, benefits of heparin  far outweigh any risks of hemorrhagic conversion of her probable stroke as inferred from her exam and CTA results.  - MRI brain when able - HgbA1c, fasting lipid panel - TTE - PT consult, OT consult, Speech consult - Risks of statin may outweigh benefits given the advanced age and frailty of this patient - Modified permissive HTN protocol given patient's advanced age and anticoagulation with heparin . Treat if SBP is > 160. Maintain SBP > 120.  - Risk factor modification - Telemetry monitoring - Frequent neuro checks - NPO until passes stroke swallow screen - Management of her pulmonary function per EDP and Pulmonology   60 minutes spent in the emergent neurological evaluation and management of this critically ill patient  ______________________________________________________________________    Bonney SHARK, Kj Imbert, MD [facility]

## 2023-08-24 NOTE — Progress Notes (Signed)
 Pt transported to CT and returned to ED 3 on the Bipap without incident. Pt remains on the Bipap and is tol well at this time.

## 2023-08-24 NOTE — ED Provider Notes (Signed)
 [facility] Provider Note    Event Date/Time   First MD Initiated Contact with Patient 08/24/23 1846     (approximate)   History   Chief Complaint Code Stroke   HPI  Jennifer Bernard is a 89 y.o. 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  Son at bedside reports that patient seemed well when he talked to her on the phone around 5 PM this evening.  Family then arrived to visit with patient a few minutes later and found her to have significantly slurred speech along with left-sided facial droop.  EMS reports that patient's speech seemed to worsen during transport although she was still able to answer questions, code stroke activated prior to arrival.  EMS reports that patient recently diagnosed with adenocarcinoma of the gallbladder, seen by palliative care earlier today and completed a DNR.  Family also reports that patient has been dealing with a cough and some increasing difficulty breathing for the past 3 days.  Patient denies any pain in her chest and is not aware of any fevers.     Physical Exam   Triage Vital Signs: ED Triage Vitals [08/24/23 1852]  Encounter Vitals Group     BP 129/81     Systolic BP Percentile      Diastolic BP Percentile      Pulse Rate 97     Resp (!) 24     Temp 98.3 F (36.8 C)     Temp Source Axillary     SpO2 92 %     Weight      Height      Head Circumference      Peak Flow      Pain Score      Pain Loc      Pain Education      Exclude from Growth Chart     Most recent vital signs: Vitals:   08/24/23 2100 08/24/23 2130  BP: 103/73 (!) 126/96  Pulse: 91 92  Resp: 17 (!) 29  Temp:    SpO2: 100% 100%    Constitutional: Alert and oriented to person and place. Eyes: Conjunctivae are normal. Head: Atraumatic. Nose: No congestion/rhinnorhea. Mouth/Throat: Mucous membranes are moist.  Cardiovascular:  Normal rate, regular rhythm. Grossly normal heart sounds.  2+ radial pulses bilaterally. Respiratory: Tachypneic with increased respiratory effort.  No retractions. Lungs CTAB. Gastrointestinal: Soft and nontender. No distention. Musculoskeletal: No lower extremity tenderness nor edema.  Neurologic: Slurred speech and left-sided facial droop noted along with left-sided neglect.  4-5 strength in left upper extremity, 5 out of 5 strength in right upper extremity and bilateral lower extremities.    ED Results / Procedures / Treatments   Labs (all labs ordered are listed, but only abnormal results are displayed) Labs Reviewed  PROTIME-INR - Abnormal; Notable for the following components:      Result Value   Prothrombin Time 16.8 (*)    INR 1.3 (*)    All other components within normal limits  APTT - Abnormal; Notable for the following components:   aPTT 39 (*)    All other components within normal limits  CBC - Abnormal; Notable for the following components:   WBC 17.4 (*)    Hemoglobin 11.7 (*)    RDW 15.8 (*)    All other components within normal limits  DIFFERENTIAL - Abnormal; Notable for the following components:  Neutro Abs 13.4 (*)    Monocytes Absolute 1.9 (*)    Abs Immature Granulocytes 0.11 (*)    All other components within normal limits  COMPREHENSIVE METABOLIC PANEL - Abnormal; Notable for the following components:   CO2 20 (*)    Glucose, Bld 157 (*)    BUN 31 (*)    Creatinine, Ser 1.16 (*)    Albumin 3.1 (*)    GFR, Estimated 44 (*)    All other components within normal limits  BRAIN NATRIURETIC PEPTIDE - Abnormal; Notable for the following components:   B Natriuretic Peptide 716.8 (*)    All other components within normal limits  BLOOD GAS, VENOUS - Abnormal; Notable for the following components:   pCO2, Ven 35 (*)    pO2, Ven <31 (*)    All other components within normal limits  URINALYSIS, ROUTINE W REFLEX MICROSCOPIC - Abnormal; Notable for the  following components:   Color, Urine AMBER (*)    APPearance CLOUDY (*)    Hgb urine dipstick MODERATE (*)    Protein, ur 100 (*)    Leukocytes,Ua MODERATE (*)    Bacteria, UA RARE (*)    All other components within normal limits  CBG MONITORING, ED - Abnormal; Notable for the following components:   Glucose-Capillary 126 (*)    All other components within normal limits  TROPONIN I (HIGH SENSITIVITY) - Abnormal; Notable for the following components:   Troponin I (High Sensitivity) 644 (*)    All other components within normal limits  TROPONIN I (HIGH SENSITIVITY) - Abnormal; Notable for the following components:   Troponin I (High Sensitivity) 689 (*)    All other components within normal limits  URINE CULTURE  ETHANOL  MAGNESIUM   HEPARIN  LEVEL (UNFRACTIONATED)  CBC  LIPID PANEL  BASIC METABOLIC PANEL  I-STAT CREATININE, ED     EKG  ED ECG REPORT I, Carlin Palin, the attending physician, personally viewed and interpreted this ECG.   Date: 08/24/2023  EKG Time: 20:51  Rate: 95  Rhythm: normal sinus rhythm  Axis: Normal  Intervals: Prolonged QT  ST&T Change: None  RADIOLOGY CT head reviewed and interpreted by me with no hemorrhage or midline shift.  PROCEDURES:  Critical Care performed: Yes, see critical care procedure note(s)  .Critical Care  Performed by: Palin Carlin, MD Authorized by: Palin Carlin, MD   Critical care provider statement:    Critical care time (minutes):  75   Critical care time was exclusive of:  Separately billable procedures and treating other patients and teaching time   Critical care was necessary to treat or prevent imminent or life-threatening deterioration of the following conditions:  CNS failure or compromise and cardiac failure   Critical care was time spent personally by me on the following activities:  Development of treatment plan with patient or surrogate, discussions with consultants, evaluation of patient's response to  treatment, examination of patient, ordering and review of laboratory studies, ordering and review of radiographic studies, ordering and performing treatments and interventions, pulse oximetry, re-evaluation of patient's condition and review of old charts   I assumed direction of critical care for this patient from another provider in my specialty: no     Care discussed with: admitting provider      MEDICATIONS ORDERED IN ED: Medications  heparin  ADULT infusion 100 units/mL (25000 units/250mL) (1,000 Units/hr Intravenous New Bag/Given 08/24/23 2120)  amLODipine  (NORVASC ) tablet 5 mg (has no administration in time range)  pravastatin  (PRAVACHOL ) tablet 40 mg (  has no administration in time range)  escitalopram  (LEXAPRO ) tablet 10 mg (has no administration in time range)  levothyroxine  (SYNTHROID ) tablet 75 mcg (has no administration in time range)  pantoprazole  (PROTONIX ) EC tablet 40 mg (has no administration in time range)  B-12 TABS 1,000 mcg (has no administration in time range)   stroke: early stages of recovery book (has no administration in time range)  0.9 %  sodium chloride  infusion (has no administration in time range)  acetaminophen  (TYLENOL ) tablet 650 mg (has no administration in time range)    Or  acetaminophen  (TYLENOL ) suppository 650 mg (has no administration in time range)  traZODone  (DESYREL ) tablet 25 mg (has no administration in time range)  magnesium  hydroxide (MILK OF MAGNESIA) suspension 30 mL (has no administration in time range)  ondansetron  (ZOFRAN ) tablet 4 mg (has no administration in time range)    Or  ondansetron  (ZOFRAN ) injection 4 mg (has no administration in time range)  aspirin  EC tablet 81 mg (has no administration in time range)  sodium chloride  flush (NS) 0.9 % injection 3 mL (3 mLs Intravenous Given 08/24/23 1916)  iohexol  (OMNIPAQUE ) 350 MG/ML injection 80 mL (80 mLs Intravenous Contrast Given 08/24/23 2012)  heparin  bolus via infusion 3,600 Units (3,600  Units Intravenous Bolus from Bag 08/24/23 2118)  cefTRIAXone  (ROCEPHIN ) 1 g in sodium chloride  0.9 % 100 mL IVPB (1 g Intravenous New Bag/Given 08/24/23 2130)     IMPRESSION / MDM / ASSESSMENT AND PLAN / ED COURSE  I reviewed the triage vital signs and the nursing notes.                              89 y.o. female with past medical history of hypertension, OSA, and adenocarcinoma of the gallbladder who presents to the ED complaining of acute onset slurred speech and left-sided facial droop along with 3 days of cough and difficulty breathing.  Patient's presentation is most consistent with acute presentation with potential threat to life or bodily function.  Differential diagnosis includes, but is not limited to, stroke, TIA, intracranial hemorrhage, large vessel occlusion, anemia, electrolyte abnormality, AKI, UTI, ACS, PE, pneumonia, pneumothorax, COPD, CHF.  Patient ill-appearing on arrival to the ED, code stroke activated prior to arrival and she does appear to have slurred speech with left-sided facial droop along with neglect of her left side.  Patient taken immediately to CT scanner and CT head is negative for acute process.  Patient then quickly taken back to her room to address her work of breathing, she had been placed on a nonrebreather with EMS, found to have oxygen  saturations in the mid 80s on room air.  She was transition to BiPAP and work of breathing seems to be improving.  Following discussion of risks and benefits, family opted against TNK due to high risk of bleeding.  Dr. Merrianne of neurology recommends holding off on CT angiogram until we determine patient's renal function, also plan to use check CTA of her chest at that time.  Labs with leukocytosis but no significant anemia or electrolyte abnormality, patient with mild AKI.  LFTs are unremarkable but patient does have elevated BNP as well as significant elevation in troponin.  CTA chest is positive for significant burden of  bilateral pulmonary embolism with evidence of right heart strain.  We will start patient on IV heparin  as benefits of addressing pulmonary embolism seem to outweigh the risks, neurology in agreement with this.  Unfortunately, CTA of her head and neck also shows occlusion of proximal M2 segment on the right.  Case discussed with Dr. Merrianne, Dr. Jerrie of neurology at [facility], and Dr. Dolphus of interventional radiology at [facility].  Patient determined not to be a candidate for endovascular intervention for large vessel occlusion given risk of bleeding.  Family also stating at this time that they have no desire for invasive interventions.  They would like to see how patient responds to heparin  and have her admitted to the hospital, however expressed desire that she not be in any pain and be made as comfortable as possible.  They wish to avoid any escalation of care and expressed desire to transition to comfort care if patient's condition were to worsen.  Case discussed with hospitalist for admission.       FINAL CLINICAL IMPRESSION(S) / ED DIAGNOSES   Final diagnoses:  Cerebrovascular accident (CVA), unspecified mechanism (HCC)  Other acute pulmonary embolism with acute cor pulmonale (HCC)     Rx / DC Orders   ED Discharge Orders     None        Note:  This document was prepared using Dragon voice recognition software and may include unintentional dictation errors.   Willo Dunnings, MD 08/24/23 2257

## 2023-08-24 NOTE — Progress Notes (Signed)
   08/24/23 1800  Spiritual Encounters  Type of Visit Initial  Referral source Code page  Reason for visit Code  OnCall Visit Yes   Chaplain responded to Code Stroke. Patient immediately taken to CT. No family present. Chaplain services available upon request.

## 2023-08-24 NOTE — H&P (Signed)
 [place]   PATIENT NAME: Jennifer Bernard    MR#:  [id number]  DATE OF BIRTH:  Apr 14, 1927  DATE OF ADMISSION:  08/24/2023  PRIMARY CARE PHYSICIAN: Glendia Shad, MD   Patient is coming from: Home  REQUESTING/REFERRING PHYSICIAN: Willo Dunnings, MD  CHIEF COMPLAINT:   Chief Complaint  Patient presents with   Code Stroke    HISTORY OF PRESENT ILLNESS:  Jennifer Bernard is a 89 y.o. 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  Her son reported that he talked to her around 5 PM this afternoon and she seemed well then.  The patient admits to occasional cough and dyspnea without wheezing.  No fever or chills.  No chest pain or palpitations.  No nausea or vomiting or abdominal pain.  No melena or bright red being per rectum.  No bleeding diathesis.  She had a chest x-ray earlier this morning and revealed mild cardiomegaly with no acute cardiopulmonary disease.  She had a plan to have palliative care consult.    ED Course: When she came to the ER, BP was 129/81 with respiratory to 24 and pulse oximetry was 92% on 6 L of O2 and 89% on room air.  She was initially placed on BiPAP and later on nasal cannula.  Labs revealed leukocytosis of 17.4 with neutrophilia and hemoglobin 11.7 with hematocrit 37.5.  CMP was remarkable for BUN of 31 with creatinine 1.16 above previous levels and CO2 20 with blood glucose 157.  Albumin was 3.1.  High sensitive troponin I was 644 and later 689 and BNP was 716.8.  INR is 1.3 and PT 16.8 with PTT of 39. EKG as reviewed by me :  EKG showed normal sinus rhythm with rate of 95 with low voltage QRS and prolonged QT interval with QTc of 555  MS. Imaging: Repeat portable chest x-ray showed no acute cardiopulmonary disease.  Code stroke CT revealed no acute intracranial normalities. CTA of the head and neck showed the following 1. Acute proximal right M2 branch occlusion. 2. Focal severe proximal right P2 stenosis. 3. Few scattered filling defects within the proximal segmental pulmonary arteries bilaterally, consistent with acute pulmonary emboli. 4. Question 1.4 cm polypoid soft tissue lesion positioned at the central/posterior aspect of the upper subglottic airway. Correlation with direct visualization/bronchoscopy suggested for further evaluation as warranted. 5.  Aortic Atherosclerosis.   Chest CTA revealed the following: 1. Positive for acute PE involving all lobes with CT evidence of right heart strain (RV/LV Ratio = 1.7 ) consistent with at least submassive (intermediate risk) PE. The presence of right heart strain has been associated with an increased risk of morbidity and mortality. Please refer to the Code PE Focused order set in EPIC. 2. Stable hepatic metastatic disease. 3. Aortic atherosclerosis.  The patient was started on IV heparin .  Her family elected not to pursue further aggressive management including tPA or thrombectomy for her PE.  Dr. Lindzen was notified but the patient and evaluated her.  She will be admitted to progressive unit bed for further evaluation and management PAST MEDICAL HISTORY:   Past Medical History:  Diagnosis Date   Asymptomatic menopausal state 02/20/1984   Overview:  Naturally occuring and asymptomatic Vaginal atrophy present  on topical estrogen Overview:  Overview:  Naturally occuring and asymptomatic Vaginal atrophy present on topical estrogen   Basal cell carcinoma of skin 02/19/2013   Overview:  Basal cell on her nasolabial fold 2014 S/p Mohs by Dr. Bluford Followed by local dermatologist regularly Overview:  Overview:  Followed by Dr. Bluford   Breast cancer [facility])    Cyst of  ovary 02/19/2009   Overview:  Ovarian cyst on right stable 2004-2010 Cyst ? larger on CT 2012 Stable on follow up US  Followed by Dr. Elvie Pinal Overview:  Overview:  Ovarian cyst on right stable 2004-2010 Cyst ? larger on CT 2012 Stable on follow up US  Followed by Dr. Elvie Pinal   Depression, major, single episode, complete remission (HCC) 07/18/2017   Diffuse cystic mastopathy of breast 02/19/2013   Overview:  Mother with breast cancer later in life Two breast biopsies both with normal pathology Annual mammograms recommended, last 2016 Followed at the [facility]  Last Assessment & Plan:  Relevant Hx: Course: Daily Update: Today's Plan: Overview:  Overview:  Mother with breast cancer later in life Two breast biopsies both with normal pathology Annual mammograms recommended, last 201   Essential (primary) hypertension 02/19/2013   Overview:  Goal blood pressure < 135/85 On atenolol Overview:  Overview:  Goal blood pressure < 135/85 On atenolol   Family history of breast cancer 05/12/2022   Hyperlipidemia 02/19/2013   Overview:  Goal LDL < 130, last value 92 in 2017 On Pravachol  Overview:  Overview:  Goal LDL < 130, last value 82 in 2012 On Pravachol    Hypothyroidism 02/28/2012   Overview:  started on T4  TFT normal 2017 Followed by Dr. Adelita Overview:  Overview:  started on T4  TFT normal 2014 Followed by Dr. Adelita   Impaired glucose tolerance 02/20/2003   Overview:  Overview:  Glucose goal < 100, last value 94 in 2014 HgbA1c goal < 6.0, last value 5.3 in 2014 Urine microalbumin negative in 2014 On diet and exercise   Nontoxic multinodular goiter 03/06/2003   Overview:  Asymptomatic nodular thyroid  Stable US  2004-2014 Followed by Dr. Adelita Overview:  Overview:  Asymptomatic nodular thyroid  Stable US  2004-2014 Followed by Dr. Jelessof   OSA on CPAP 01/13/2015   Overview:  Sleep study sent to medical records:   Sleep efficiency 22%, latency 24 min, # awakenings 11, res desat  N=10, index 17.8  Apnea 1 for 16 sec, hypopnea 16 for 18 sec, total sleep time only 90 min so index = 11 She did not sleep much and the index puts her in mild category Repeat study a few days later when she slept 187 min her AHI = 4.6 which is normal, average O2 sat 94% I don't think    Osteoarthritis 02/20/2003   Overview:  Mild bilateral hands Right CMC Right hip Left knee s/p meniscus tear All symptomatically stable Overview:  Overview:  Mild bilateral hands Right CMC Right hip Left knee s/p meniscus tear All symptomatically stable   Osteoporosis 02/20/2003   Overview:  Bone density 2004 LS -3.32 and RH-1.62 Bone density 2008 LS -3.14 and RH -1.22 Bone density 2009 LS -3.17 and RH -1.21 Bone density 2010 LS -3.14 and RH -1.42  Bone density 2014 LS -2.7 (L3 -3.1) and RH -1.5 Bone density 2017 LS -2.3 and RH -1.4 (neck -1.7) Vitamin D  level 38 in 2012 On Boniva 2004 to 2010, restarted 2015 Repeat bone density in 2020 and stop Boniva  Last Assessment & Pl  Prediabetes 02/20/2003   Overview:  Glucose goal < 100, last value 91 in 2017 HgbA1c goal < 6.0, last value 5.6 in 2017 Urine microalbumin negative in 2017 On diet and exercise   Recurrent UTI 10/19/2014   Squamous cell carcinoma of skin 03/14/2019   Left mid brow. SCCis, hypertrophic. EDC    PAST SURGICAL HISTORY:   Past Surgical History:  Procedure Laterality Date   ABDOMINAL HYSTERECTOMY  1975   MASTECTOMY Left    URETEROSCOPY     URETEROSCOPY WITH HOLMIUM LASER LITHOTRIPSY      SOCIAL HISTORY:   Social History   Tobacco Use   Smoking status: Never   Smokeless tobacco: Never  Substance Use Topics   Alcohol use: Yes    Comment: twice a week, a glass of wine    FAMILY HISTORY:   Family History  Problem Relation Age of Onset   Breast cancer Mother        dx late 6s   Skin cancer Father        non-melanoma; head   Liver cancer Sister        or other primary: d. 77   Breast cancer Maternal Grandmother        dx late 64s    Skin cancer Daughter        non-melanoma    DRUG ALLERGIES:   Allergies  Allergen Reactions   Penicillins Hives    Other reaction(s): Unknown   Ceftin  [Cefuroxime  Axetil] Diarrhea    GI distress   Ramipril     Other reaction(s): Cough Other reaction(s): Cough     REVIEW OF SYSTEMS:   ROS As per history of present illness. All pertinent systems were reviewed above. Constitutional, HEENT, cardiovascular, respiratory, GI, GU, musculoskeletal, neuro, psychiatric, endocrine, integumentary and hematologic systems were reviewed and are otherwise negative/unremarkable except for positive findings mentioned above in the HPI.   MEDICATIONS AT HOME:   Prior to Admission medications   Medication Sig Start Date End Date Taking? Authorizing Provider  amLODipine  (NORVASC ) 5 MG tablet TAKE 1 TABLET BY MOUTH DAILY 04/05/23   Glendia Shad, MD  Cyanocobalamin  (B-12) 1000 MCG TABS TAKE 1 TABLET BY MOUTH DAILY. 08/03/22   Glendia Shad, MD  escitalopram  (LEXAPRO ) 10 MG tablet TAKE 1 TABLET BY MOUTH DAILY (FOR MOOD/DEPRESSION) 03/07/23   Glendia Shad, MD  fesoterodine  (TOVIAZ ) 4 MG TB24 tablet Take 1 tablet (4 mg total) by mouth daily. Patient not taking: Reported on 08/24/2023 07/26/23   Helon Kirsch A, PA-C  levothyroxine  (SYNTHROID , LEVOTHROID) 75 MCG tablet  06/12/17   [provider]  mupirocin  ointment (BACTROBAN ) 2 % Apply 1 Application topically 2 (two) times daily. Patient not taking: Reported on 08/24/2023 04/20/23   Glendia Shad, MD  pantoprazole  (PROTONIX ) 40 MG tablet TAKE 1 TABLET BY MOUTH 30 MINUTES BEFOREEVENING MEAL 01/17/23   Glendia Shad, MD  pravastatin  (PRAVACHOL ) 40 MG tablet TAKE ONE TABLET EACH EVENING 04/05/23   Glendia Shad, MD  triamcinolone  cream (KENALOG ) 0.1 % Apply 1 Application topically 2 (two) times daily. Apply to affected area on arm bid x 1 week. Patient not taking: Reported on 08/24/2023 01/16/23   Hester Alm BROCKS, MD      VITAL SIGNS:   Blood pressure 108/65, pulse 89, temperature 98.3 F (36.8 C), temperature source Axillary, resp. rate (!) 22, height 5' 2 (1.575 m), weight 60.4 kg, SpO2 94%.  PHYSICAL EXAMINATION:  Physical Exam  GENERAL:  89 y.o.-year-old Caucasian female patient lying in the bed  with no acute distress.  EYES: Pupils equal, round, reactive to light and accommodation. No scleral icterus. Extraocular muscles intact.  HEENT: Head atraumatic, normocephalic. Oropharynx and nasopharynx clear.  NECK:  Supple, no jugular venous distention. No thyroid  enlargement, no tenderness.  LUNGS: Normal breath sounds bilaterally, no wheezing, rales,rhonchi or crepitation. No use of accessory muscles of respiration.  CARDIOVASCULAR: Regular rate and rhythm, S1, S2 normal. No murmurs, rubs, or gallops.  ABDOMEN: Soft, nondistended, nontender. Bowel sounds present. No organomegaly or mass.  EXTREMITIES: No pedal edema, cyanosis, or clubbing.  NEUROLOGIC: Cranial nerves II through XII are intact except for left facial droop and slurred speech with mild expressive dysphasia. Muscle strength 5/5 in all extremities except for the left lower extremity muscle strength.  3-4/5.  Sensation intact. Gait not checked.  PSYCHIATRIC: The patient is alert and oriented x 3.  Normal affect and good eye contact. SKIN: No obvious rash, lesion, or ulcer.   LABORATORY PANEL:   CBC Recent Labs  Lab 08/24/23 1851  WBC 17.4*  HGB 11.7*  HCT 37.5  PLT PLATELET CLUMPS NOTED ON SMEAR, UNABLE TO ESTIMATE   ------------------------------------------------------------------------------------------------------------------  Chemistries  Recent Labs  Lab 08/24/23 1851  NA 137  K 4.3  CL 103  CO2 20*  GLUCOSE 157*  BUN 31*  CREATININE 1.16*  CALCIUM 8.9  MG 2.0  AST 35  ALT 14  ALKPHOS 101  BILITOT 1.0   ------------------------------------------------------------------------------------------------------------------  Cardiac  Enzymes No results for input(s): TROPONINI in the last 168 hours. ------------------------------------------------------------------------------------------------------------------  RADIOLOGY:  CT ANGIO HEAD NECK W WO CM (CODE STROKE) Result Date: 08/24/2023 CLINICAL DATA:  Initial evaluation for acute neuro deficit, stroke suspected. EXAM: CT ANGIOGRAPHY HEAD AND NECK WITH AND WITHOUT CONTRAST TECHNIQUE: Multidetector CT imaging of the head and neck was performed using the standard protocol during bolus administration of intravenous contrast. Multiplanar CT image reconstructions and MIPs were obtained to evaluate the vascular anatomy. Carotid stenosis measurements (when applicable) are obtained utilizing NASCET criteria, using the distal internal carotid diameter as the denominator. RADIATION DOSE REDUCTION: This exam was performed according to the departmental dose-optimization program which includes automated exposure control, adjustment of the mA and/or kV according to patient size and/or use of iterative reconstruction technique. CONTRAST:  80mL OMNIPAQUE  IOHEXOL  350 MG/ML SOLN COMPARISON:  CT from earlier the same day. FINDINGS: CTA NECK FINDINGS Aortic arch: Examination mildly degraded by motion artifact, somewhat limiting assessment. Visualized aortic arch within normal limits for caliber with standard branch pattern. Mild aortic atherosclerosis. No significant stenosis about the origin the great vessels. Right carotid system: Right common and internal carotid arteries are tortuous without dissection or stenosis. Left carotid system: Left common and internal carotid arteries are tortuous without stenosis or dissection. Vertebral arteries: Left vertebral artery diminutive in arises directly from the aortic arch. Vertebral arteries are patent without visible stenosis or dissection. Skeleton: No discrete or worrisome osseous lesions. Moderate spondylosis at C4-5 through C6-7. Other neck: No other acute  soft tissue abnormality. There is question of a polypoid soft tissue lesion positioned at the central/posterior aspect of the upper subglottic airway measuring up to approximately 1.4 cm (series 1, images 54-52). Upper chest: Few scattered filling defects noted within the proximal segmental pulmonary arteries bilaterally, consistent with acute pulmonary emboli. Review of the MIP images confirms the above findings CTA HEAD FINDINGS Anterior circulation: Both internal carotid arteries are patent through the siphons without stenosis. A1 segments patent bilaterally. Normal anterior communicating artery complex. Anterior cerebral arteries patent  bilaterally. No M1 stenosis or occlusion. Distal left MCA branches perfused. On the right, there is acute occlusion of a proximal right M2 branch (series 3, image 222). Remainder of the right MCA branches patent and perfused. Posterior circulation: Dominant right V4 segment widely patent. Hypoplastic left vertebral artery largely terminates in PICA, although a tiny branch ascending towards the vertebrobasilar junction. Left PICA patent. Right PICA origin not well seen. Basilar patent without stenosis. Superior cerebral arteries patent bilaterally. Left PCA supplied via the basilar. Predominant fetal type origin right PCA. Left PCA widely patent without stenosis. Focal severe proximal right P2 stenosis (series 5, image 22). Right PCA otherwise patent to its distal aspect. Venous sinuses: Not well assessed due to timing the contrast bolus. Anatomic variants: As above.  No aneurysm. Review of the MIP images confirms the above findings IMPRESSION: 1. Acute proximal right M2 branch occlusion. 2. Focal severe proximal right P2 stenosis. 3. Few scattered filling defects within the proximal segmental pulmonary arteries bilaterally, consistent with acute pulmonary emboli. 4. Question 1.4 cm polypoid soft tissue lesion positioned at the central/posterior aspect of the upper subglottic  airway. Correlation with direct visualization/bronchoscopy suggested for further evaluation as warranted. 5.  Aortic Atherosclerosis (ICD10-I70.0). Critical Value/emergent results were called by telephone at the time of interpretation on 08/24/2023 at 8:39 pm to provider Dr. Willo, Who verbally acknowledged these results. Electronically Signed   By: Morene Hoard M.D.   On: 08/24/2023 20:44   CT Angio Chest PE W/Cm &/Or Wo Cm Result Date: 08/24/2023 CLINICAL DATA:  High probability for PE.  Slurred speech. EXAM: CT ANGIOGRAPHY CHEST WITH CONTRAST TECHNIQUE: Multidetector CT imaging of the chest was performed using the standard protocol during bolus administration of intravenous contrast. Multiplanar CT image reconstructions and MIPs were obtained to evaluate the vascular anatomy. RADIATION DOSE REDUCTION: This exam was performed according to the departmental dose-optimization program which includes automated exposure control, adjustment of the mA and/or kV according to patient size and/or use of iterative reconstruction technique. CONTRAST:  80mL OMNIPAQUE  IOHEXOL  350 MG/ML SOLN COMPARISON:  PET-CT 07/31/2023. FINDINGS: Cardiovascular: There is adequate opacification of the pulmonary arteries. Pulmonary emboli are seen throughout segmental and subsegmental branches of all lobes bilaterally. There is no large central pulmonary embolism. The aorta is ectatic. The heart is enlarged. There is no pericardial effusion. Mediastinum/Nodes: No enlarged mediastinal, hilar, or axillary lymph nodes. There is a rounded enhancing right thyroid  nodule measuring 1 cm. Lungs/Pleura: Lungs are clear. No pleural effusion or pneumothorax. Upper Abdomen: Multiple hypodense hepatic masses are unchanged. Musculoskeletal: Left mastectomy changes are present. No acute osseous abnormality. Review of the MIP images confirms the above findings. IMPRESSION: 1. Positive for acute PE involving all lobes with CT evidence of right heart  strain (RV/LV Ratio = 1.7 ) consistent with at least submassive (intermediate risk) PE. The presence of right heart strain has been associated with an increased risk of morbidity and mortality. Please refer to the Code PE Focused order set in EPIC. 2. Stable hepatic metastatic disease. 3. Aortic atherosclerosis. Aortic Atherosclerosis (ICD10-I70.0). Electronically Signed   By: Greig Pique M.D.   On: 08/24/2023 20:32   DG Chest Portable 1 View Result Date: 08/24/2023 CLINICAL DATA:  Shortness of breath, code stroke EXAM: PORTABLE CHEST 1 VIEW COMPARISON:  08/24/2023 FINDINGS: Stable cardiomediastinal silhouette. Aortic atherosclerotic calcification. No focal consolidation, pleural effusion, or pneumothorax. No displaced rib fractures. Elevated right hemidiaphragm. Surgical clips left breast. IMPRESSION: No acute cardiopulmonary disease. Electronically Signed   By: Norman  Stutzman M.D.   On: 08/24/2023 19:44   CT HEAD CODE STROKE WO CONTRAST Result Date: 08/24/2023 CLINICAL DATA:  Code stroke. EXAM: CT HEAD WITHOUT CONTRAST TECHNIQUE: Contiguous axial images were obtained from the base of the skull through the vertex without intravenous contrast. RADIATION DOSE REDUCTION: This exam was performed according to the departmental dose-optimization program which includes automated exposure control, adjustment of the mA and/or kV according to patient size and/or use of iterative reconstruction technique. COMPARISON:  07/25/2022 FINDINGS: Brain: No evidence of acute infarction, hemorrhage, mass, mass effect, or midline shift. No hydrocephalus or extra-axial collection. Age-related cerebral volume loss. Periventricular white matter changes, likely the sequela of chronic small vessel ischemic disease. Vascular: No hyperdense vessel. Skull: Negative for fracture or focal lesion. Sinuses/Orbits: No acute finding. Status post bilateral lens replacements. Other: The mastoid air cells are well aerated. ASPECTS [facility]  Stroke Program Early CT Score) - Ganglionic level infarction (caudate, lentiform nuclei, internal capsule, insula, M1-M3 cortex): 7 - Supraganglionic infarction (M4-M6 cortex): 3 Total score (0-10 with 10 being normal): 10 IMPRESSION: No acute intracranial process. ASPECTS is 10. Imaging results were communicated on 08/24/2023 at 6:35 pm to provider Dr. Lindzen via secure text paging. Electronically Signed   By: Donald Campion M.D.   On: 08/24/2023 18:35   DG Chest 2 View Result Date: 08/24/2023 CLINICAL DATA:  Cough.  Gallbladder carcinoma. EXAM: CHEST - 2 VIEW COMPARISON:  04/10/2020 FINDINGS: Mild cardiomegaly and ectasia of thoracic aorta noted. Mild scarring is seen in the left lung base The lungs are otherwise clear. Numerous surgical clips seen in the left anterior chest wall soft tissues. IMPRESSION: Mild cardiomegaly.  No active lung disease. Electronically Signed   By: Norleen DELENA Kil M.D.   On: 08/24/2023 11:36      IMPRESSION AND PLAN:  Assessment and Plan: * Acute pulmonary embolism with acute cor pulmonale (HCC) - The patient be admitted to a progressive unit bed. - We will continue IV heparin . - At this time the benefits of IV heparin  outweigh the risks with her acute CVA. - Will obtain a 2D echo to further assess for RV strain. - Given her submassive PE vascular surgery consult was offered and thrombectomy was offered and the family declined on aggressive measures. - With lack of improvement on IV heparin  or in case of deterioration, the family will be leaning towards comfort measures. - At this time they want to follow progress overnight before they consider hospice/palliative care consult.  Acute CVA (cerebrovascular accident) Jennifer Bernard) - This is clearly acute right sided ischemic stroke with subsequent left-sided hemiparesis. - Her muscle strength has actually been improving. - She continues to have slow speech and left facial droop. - Neurology consult was obtained by Dr. Lindzen who  will be following. - The family declined brain MRI. - A 2D echo with bubble study will be obtained. - She will be placed on baby aspirin .  Acute respiratory failure with hypoxia (HCC) - She required BiPAP and was later on tapered down to nasal cannula. - O2 protocol will be followed. - This is clearly secondary to her acute submassive PE.  Primary gall bladder adenocarcinoma (HCC) - This is stage IV with liver metastasis. - Pain management will be provided and will take priority over other aspects of management.   Depression - We will continue Lexapro .  GERD without esophagitis Will continue PPI therapy  Essential hypertension - We will continue antihypertensive therapy and monitor BP especially with her submassive PE.  Hypothyroidism -  We will continue Synthroid .   DVT prophylaxis: IV heparin . Advanced Care Planning:  Code Status: full code.  Family Communication:  The plan of care was discussed in details with the patient (and family). I answered all questions. The patient agreed to proceed with the above mentioned plan. Further management will depend upon Bernard course. Disposition Plan: Back to previous home environment Consults called: Neurology. All the records are reviewed and case discussed with ED provider.  Status is: Inpatient  At the time of the admission, it appears that the appropriate admission status for this patient is inpatient.  This is judged to be reasonable and necessary in order to provide the required intensity of service to ensure the patient's safety given the presenting symptoms, physical exam findings and initial radiographic and laboratory data in the context of comorbid conditions.  The patient requires inpatient status due to high intensity of service, high risk of further deterioration and high frequency of surveillance required.  I certify that at the time of admission, it is my clinical judgment that the patient will require inpatient  Bernard care extending more than 2 midnights.                            Dispo: The patient is from: Home              Anticipated d/c is to: Home              Patient currently is not medically stable to d/c.              Difficult to place patient: No  [place] DELENA Peaches M.D on 08/25/2023 at 2:17 AM  [facility]   From 7 PM-7 AM, contact night-coverage [email or web address]  CC: Primary care physician; Glendia Shad, MD

## 2023-08-24 NOTE — Progress Notes (Signed)
 TSRN off camera @ 1943.  No TNK.  Josiah Lobo BSN, Occupational hygienist

## 2023-08-25 ENCOUNTER — Other Ambulatory Visit: Payer: Medicare PPO

## 2023-08-25 DIAGNOSIS — I2609 Other pulmonary embolism with acute cor pulmonale: Secondary | ICD-10-CM | POA: Diagnosis not present

## 2023-08-25 DIAGNOSIS — I10 Essential (primary) hypertension: Secondary | ICD-10-CM | POA: Insufficient documentation

## 2023-08-25 DIAGNOSIS — F32A Depression, unspecified: Secondary | ICD-10-CM | POA: Insufficient documentation

## 2023-08-25 DIAGNOSIS — J9601 Acute respiratory failure with hypoxia: Secondary | ICD-10-CM

## 2023-08-25 DIAGNOSIS — K219 Gastro-esophageal reflux disease without esophagitis: Secondary | ICD-10-CM | POA: Insufficient documentation

## 2023-08-25 DIAGNOSIS — C23 Malignant neoplasm of gallbladder: Secondary | ICD-10-CM | POA: Diagnosis not present

## 2023-08-25 DIAGNOSIS — I639 Cerebral infarction, unspecified: Secondary | ICD-10-CM | POA: Diagnosis not present

## 2023-08-25 MED ORDER — GLYCOPYRROLATE 0.2 MG/ML IJ SOLN
0.2000 mg | INTRAMUSCULAR | Status: DC | PRN
  Administered 2023-08-25 – 2023-08-27 (×4): 0.2 mg via INTRAVENOUS
  Filled 2023-08-25 (×5): qty 1

## 2023-08-25 MED ORDER — GLYCOPYRROLATE 1 MG PO TABS: 1.0000 mg | ORAL_TABLET | ORAL | Status: DC | PRN

## 2023-08-25 MED ORDER — LORAZEPAM 2 MG/ML IJ SOLN
1.0000 mg | INTRAMUSCULAR | Status: DC | PRN
  Administered 2023-08-25 – 2023-08-28 (×6): 1 mg via INTRAVENOUS
  Filled 2023-08-25 (×6): qty 1

## 2023-08-25 MED ORDER — LORAZEPAM 1 MG PO TABS
1.0000 mg | ORAL_TABLET | ORAL | Status: DC | PRN
Start: 2023-08-25 — End: 2023-08-29

## 2023-08-25 MED ORDER — GLYCOPYRROLATE 0.2 MG/ML IJ SOLN
0.2000 mg | INTRAMUSCULAR | Status: DC | PRN
Start: 2023-08-25 — End: 2023-08-29

## 2023-08-25 MED ORDER — MORPHINE SULFATE (PF) 2 MG/ML IV SOLN
2.0000 mg | INTRAVENOUS | Status: DC | PRN
  Administered 2023-08-25 – 2023-08-26 (×3): 2 mg via INTRAVENOUS
  Filled 2023-08-25 (×3): qty 1

## 2023-08-25 MED ORDER — LORAZEPAM 2 MG/ML PO CONC: 1.0000 mg | ORAL | Status: DC | PRN

## 2023-08-25 MED ORDER — GLYCOPYRROLATE 0.2 MG/ML IJ SOLN
0.4000 mg | INTRAMUSCULAR | Status: DC | PRN
  Administered 2023-08-25: 0.4 mg via INTRAVENOUS
  Filled 2023-08-25 (×2): qty 2

## 2023-08-25 NOTE — Assessment & Plan Note (Signed)
-   The patient be admitted to a progressive unit bed. - We will continue IV heparin . - At this time the benefits of IV heparin  outweigh the risks with her acute CVA. - Will obtain a 2D echo to further assess for RV strain. - Given her submassive PE vascular surgery consult was offered and thrombectomy was offered and the family declined on aggressive measures. - With lack of improvement on IV heparin  or in case of deterioration, the family will be leaning towards comfort measures. - At this time they want to follow progress overnight before they consider hospice/palliative care consult.

## 2023-08-25 NOTE — Progress Notes (Signed)
 [facility] Liaison Note  Patient was going to be admitted to Hospice services at 1pm tomorrow, but came to the ED on 1.9.25.  Met and spoke with family today.  Plan is for patient to return home on Monday, or Tuesday.  DME needs include a hospital bed, over bed table and home 02.  Family working with [employer] to provide private duty caregivers.  Referral information submitted today.     Please don't hesitate to call with any Hospice related questions or concerns.    Thank you for the opportunity to participate in this patient's care.  [facility] Liaison [phone]

## 2023-08-25 NOTE — Assessment & Plan Note (Signed)
-   We will continue antihypertensive therapy and monitor BP especially with her submassive PE.

## 2023-08-25 NOTE — ED Notes (Signed)
 Pt clean and dry at this time. Pt adjusted in bed for comfort.

## 2023-08-25 NOTE — Telephone Encounter (Signed)
 Please call and notify - I am ok with being the attending.

## 2023-08-25 NOTE — Telephone Encounter (Signed)
 Patient is currently admitted to the hospital but verbal was given to Hospice for Dr Lorin Picket to be attending if pt is discharged home with hospice.

## 2023-08-25 NOTE — Progress Notes (Signed)
 Patient arrived to unit at 1750 in stretcher.  Patient transferred to bed with total assist x 3; 2L oxygen  via [place]; purwich to low intermittence suction.  Patient responsive to painful stimuli only, changed into hospital gown, positioned on right side. Family present in room.

## 2023-08-25 NOTE — ED Notes (Signed)
 Family continues to decline anything that does not provide comfort to the pt or is invasive, Family made this official now after extensive consultation with Valente David M.D.

## 2023-08-25 NOTE — Progress Notes (Signed)
 OT Cancellation Note  Patient Details Name: Jennifer Bernard MRN: [id number] DOB: 11-06-1926   Cancelled Treatment:    Reason Eval/Treat Not Completed: Medical issues which prohibited therapy. OT order received. Chart reviewed. Pt noted with acute PE. Contraindicated for OT services at this time. Will hold 48 hours or until medically cleared to participate in therapy services per protocols.   Jhonny Pelton, M.S., OTR/L 08/25/23, 8:01 AM

## 2023-08-25 NOTE — Assessment & Plan Note (Signed)
-   This is stage IV with liver metastasis. - Pain management will be provided and will take priority over other aspects of management.

## 2023-08-25 NOTE — Assessment & Plan Note (Signed)
 Will continue PPI therapy.

## 2023-08-25 NOTE — ED Notes (Signed)
 Changed pt's brief and performed perineal care, applied pure wick and adjusted pt in bed. Pt attempted some sips of water

## 2023-08-25 NOTE — ED Notes (Signed)
 MD Gordy Councilman made aware that pt's HCPOA has been declining all treatments that do not provide comfort to pt.

## 2023-08-25 NOTE — ED Notes (Signed)
 MD made aware that IV heparin was stopped by previous RN per pt family request.

## 2023-08-25 NOTE — Progress Notes (Signed)
 PROGRESS NOTE    Jennifer Bernard  FMW:[id number] DOB: 10-26-1926 DOA: 08/24/2023 PCP: Glendia Shad, MD  Chief Complaint  Patient presents with   Code Stroke    Hospital Course:  Jennifer Bernard is 89 y.o. 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  In the Jennifer Bernard patient was found to be hypoxic to 89% on room air and tachypneic.  Labs also revealed leukocytosis, elevated expiratory troponin and BNP of 716.  EKG was normal sinus rhythm with prolonged QT interval.  Chest x-ray unremarkable.  Code stroke head CT revealed no acute intracranial abnormalities.  Neurology was consulted.  CTA head and neck revealed acute proximal right M2 branch occlusion.  Chest CT was positive for acute submassive pulmonary embolism involving all lobes with CT evidence of right heart strain.  Patient was initiated on IV heparin .  Family was offered tPA or thrombectomy but ultimately family elected not to pursue further aggressive management.  After extensive goals of care conversations, family elected to make patient comfort measures only and proceed with hospice care.  Subjective: Family members (son Jennifer Bernard, daughter-in-law Darice, granddaughter at bedside.)  We extensively discussed their wishes.  They report that they have previously had conversations with the patient regarding her wishes.  The patient, Jennifer Bernard, requested to die at home, and be out of pain.  In keeping with her wishes, the family would like to pursue home hospice.  We discussed what this management looks like.  We discussed how her pain and anxiety will be managed while she is admitted awaiting home hospice arrangements.   Objective: Vitals:   08/25/23 0500 08/25/23 0530 08/25/23 0600 08/25/23  0715  BP:      Pulse: 88 84 92 81  Resp: (!) 21 (!) 23 (!) 25 20  Temp:      TempSrc:      SpO2: 99% 99% 100% 98%  Weight:      Height:        Intake/Output Summary (Last 24 hours) at 08/25/2023 1318 Last data filed at 08/25/2023 9362 Gross per 24 hour  Intake 600 ml  Output --  Net 600 ml   Filed Weights   08/24/23 2100 08/24/23 2117  Weight: 60.8 kg 60.4 kg    Examination: General exam: Appears calm and comfortable, NAD Respiratory system: No work of breathing, symmetric chest wall expansion, on 3L currently Cardiovascular system: S1 & S2 heard, RRR.  Gastrointestinal system: Abdomen is nondistended, soft and nontender.  Neuro: sleeping peacefully Psychiatry: calm.  Assessment & Plan:  Principal Problem:   Acute pulmonary embolism with acute cor pulmonale (HCC) Active Problems:   Acute CVA (cerebrovascular accident) (HCC)   Acute respiratory failure with hypoxia (HCC)   Primary gall bladder adenocarcinoma (HCC)   Hypothyroidism   Essential hypertension   GERD without esophagitis   Depression    Comfort measures only - TOC consulted.  Working on hospice arrangements at patient's home.  Likely discharge with home hospice tomorrow - Proceed with as needed morphine  for air hunger and pain - As needed Ativan  for anxiety - Glycopyrrolate  for secretions - Turn off all alarms, discontinue all lab draws - q shift only vitals  Submassive acute pulmonary embolism with acute cor pulmonale - Initially Managed with IV heparin .  This has  since been discontinued. - Patient is hypercoagulable due to recently diagnosed malignancy, suspect that this contributed.  Acute CVA - Acute proximal right M2 branch occlusion seen on CTA head and neck - Neurology initially consulted, family declined brain MRI, tPA, thrombectomy.  Proceeding with comfort measures only as above  Gallbladder adenocarcinoma - Stage IV with liver metastasis - Management as  above  Hypertension Hypothyroidism GERD Depression - Discontinue all medications that do not provide immediate comfort.  PRNs available     Code Status: Do not attempt resuscitation (DNR) - Comfort care Family Communication: Discussed with Son Jennifer Bernard, on the phone. Son Jennifer Bernard and wife at bedside. Disposition:  Status is: Inpatient, pending home hospice arrangements    Consultants:    Treatment Team:  Consulting Physician: Merrianne Locus, MD  Procedures:    Antimicrobials:  Anti-infectives (From admission, onward)    Start     Dose/Rate Route Frequency Ordered Stop   08/24/23 2130  cefTRIAXone  (ROCEPHIN ) 1 g in sodium chloride  0.9 % 100 mL IVPB        1 g 200 mL/hr over 30 Minutes Intravenous  Once 08/24/23 2125 08/24/23 2226       Data Reviewed: I have personally reviewed following labs and imaging studies CBC: Recent Labs  Lab 08/24/23 1851  WBC 17.4*  NEUTROABS 13.4*  HGB 11.7*  HCT 37.5  MCV 84.5  PLT PLATELET CLUMPS NOTED ON SMEAR, UNABLE TO ESTIMATE   Basic Metabolic Panel: Recent Labs  Lab 08/24/23 1851  NA 137  K 4.3  CL 103  CO2 20*  GLUCOSE 157*  BUN 31*  CREATININE 1.16*  CALCIUM 8.9  MG 2.0   GFR: Estimated Creatinine Clearance: 25.4 mL/min (A) (by C-G formula based on SCr of 1.16 mg/dL (H)). Liver Function Tests: Recent Labs  Lab 08/24/23 1851  AST 35  ALT 14  ALKPHOS 101  BILITOT 1.0  PROT 6.9  ALBUMIN 3.1*   CBG: Recent Labs  Lab 08/24/23 1819  GLUCAP 126*    No results found for this or any previous visit (from the past 240 hours).   Radiology Studies: CT ANGIO HEAD NECK W WO CM (CODE STROKE) Result Date: 08/24/2023 CLINICAL DATA:  Initial evaluation for acute neuro deficit, stroke suspected. EXAM: CT ANGIOGRAPHY HEAD AND NECK WITH AND WITHOUT CONTRAST TECHNIQUE: Multidetector CT imaging of the head and neck was performed using the standard protocol during bolus administration of intravenous contrast. Multiplanar CT image  reconstructions and MIPs were obtained to evaluate the vascular anatomy. Carotid stenosis measurements (when applicable) are obtained utilizing NASCET criteria, using the distal internal carotid diameter as the denominator. RADIATION DOSE REDUCTION: This exam was performed according to the departmental dose-optimization program which includes automated exposure control, adjustment of the mA and/or kV according to patient size and/or use of iterative reconstruction technique. CONTRAST:  80mL OMNIPAQUE  IOHEXOL  350 MG/ML SOLN COMPARISON:  CT from earlier the same day. FINDINGS: CTA NECK FINDINGS Aortic arch: Examination mildly degraded by motion artifact, somewhat limiting assessment. Visualized aortic arch within normal limits for caliber with standard branch pattern. Mild aortic atherosclerosis. No significant stenosis about the origin the great vessels. Right carotid system: Right common and internal carotid arteries are tortuous without dissection or stenosis. Left carotid system: Left common and internal carotid arteries are tortuous without stenosis or dissection. Vertebral arteries: Left vertebral artery diminutive in arises directly from the aortic arch. Vertebral arteries are patent without visible stenosis or dissection. Skeleton: No discrete or worrisome osseous lesions. Moderate spondylosis  at C4-5 through C6-7. Other neck: No other acute soft tissue abnormality. There is question of a polypoid soft tissue lesion positioned at the central/posterior aspect of the upper subglottic airway measuring up to approximately 1.4 cm (series 1, images 54-52). Upper chest: Few scattered filling defects noted within the proximal segmental pulmonary arteries bilaterally, consistent with acute pulmonary emboli. Review of the MIP images confirms the above findings CTA HEAD FINDINGS Anterior circulation: Both internal carotid arteries are patent through the siphons without stenosis. A1 segments patent bilaterally. Normal  anterior communicating artery complex. Anterior cerebral arteries patent bilaterally. No M1 stenosis or occlusion. Distal left MCA branches perfused. On the right, there is acute occlusion of a proximal right M2 branch (series 3, image 222). Remainder of the right MCA branches patent and perfused. Posterior circulation: Dominant right V4 segment widely patent. Hypoplastic left vertebral artery largely terminates in PICA, although a tiny branch ascending towards the vertebrobasilar junction. Left PICA patent. Right PICA origin not well seen. Basilar patent without stenosis. Superior cerebral arteries patent bilaterally. Left PCA supplied via the basilar. Predominant fetal type origin right PCA. Left PCA widely patent without stenosis. Focal severe proximal right P2 stenosis (series 5, image 22). Right PCA otherwise patent to its distal aspect. Venous sinuses: Not well assessed due to timing the contrast bolus. Anatomic variants: As above.  No aneurysm. Review of the MIP images confirms the above findings IMPRESSION: 1. Acute proximal right M2 branch occlusion. 2. Focal severe proximal right P2 stenosis. 3. Few scattered filling defects within the proximal segmental pulmonary arteries bilaterally, consistent with acute pulmonary emboli. 4. Question 1.4 cm polypoid soft tissue lesion positioned at the central/posterior aspect of the upper subglottic airway. Correlation with direct visualization/bronchoscopy suggested for further evaluation as warranted. 5.  Aortic Atherosclerosis (ICD10-I70.0). Critical Value/emergent results were called by telephone at the time of interpretation on 08/24/2023 at 8:39 pm to provider Dr. Willo, Who verbally acknowledged these results. Electronically Signed   By: Morene Hoard M.D.   On: 08/24/2023 20:44   CT Angio Chest PE W/Cm &/Or Wo Cm Result Date: 08/24/2023 CLINICAL DATA:  High probability for PE.  Slurred speech. EXAM: CT ANGIOGRAPHY CHEST WITH CONTRAST TECHNIQUE:  Multidetector CT imaging of the chest was performed using the standard protocol during bolus administration of intravenous contrast. Multiplanar CT image reconstructions and MIPs were obtained to evaluate the vascular anatomy. RADIATION DOSE REDUCTION: This exam was performed according to the departmental dose-optimization program which includes automated exposure control, adjustment of the mA and/or kV according to patient size and/or use of iterative reconstruction technique. CONTRAST:  80mL OMNIPAQUE  IOHEXOL  350 MG/ML SOLN COMPARISON:  PET-CT 07/31/2023. FINDINGS: Cardiovascular: There is adequate opacification of the pulmonary arteries. Pulmonary emboli are seen throughout segmental and subsegmental branches of all lobes bilaterally. There is no large central pulmonary embolism. The aorta is ectatic. The heart is enlarged. There is no pericardial effusion. Mediastinum/Nodes: No enlarged mediastinal, hilar, or axillary lymph nodes. There is a rounded enhancing right thyroid  nodule measuring 1 cm. Lungs/Pleura: Lungs are clear. No pleural effusion or pneumothorax. Upper Abdomen: Multiple hypodense hepatic masses are unchanged. Musculoskeletal: Left mastectomy changes are present. No acute osseous abnormality. Review of the MIP images confirms the above findings. IMPRESSION: 1. Positive for acute PE involving all lobes with CT evidence of right heart strain (RV/LV Ratio = 1.7 ) consistent with at least submassive (intermediate risk) PE. The presence of right heart strain has been associated with an increased risk of morbidity and mortality.  Please refer to the Code PE Focused order set in EPIC. 2. Stable hepatic metastatic disease. 3. Aortic atherosclerosis. Aortic Atherosclerosis (ICD10-I70.0). Electronically Signed   By: Greig Pique M.D.   On: 08/24/2023 20:32   DG Chest Portable 1 View Result Date: 08/24/2023 CLINICAL DATA:  Shortness of breath, code stroke EXAM: PORTABLE CHEST 1 VIEW COMPARISON:   08/24/2023 FINDINGS: Stable cardiomediastinal silhouette. Aortic atherosclerotic calcification. No focal consolidation, pleural effusion, or pneumothorax. No displaced rib fractures. Elevated right hemidiaphragm. Surgical clips left breast. IMPRESSION: No acute cardiopulmonary disease. Electronically Signed   By: Norman Gatlin M.D.   On: 08/24/2023 19:44   CT HEAD CODE STROKE WO CONTRAST Result Date: 08/24/2023 CLINICAL DATA:  Code stroke. EXAM: CT HEAD WITHOUT CONTRAST TECHNIQUE: Contiguous axial images were obtained from the base of the skull through the vertex without intravenous contrast. RADIATION DOSE REDUCTION: This exam was performed according to the departmental dose-optimization program which includes automated exposure control, adjustment of the mA and/or kV according to patient size and/or use of iterative reconstruction technique. COMPARISON:  07/25/2022 FINDINGS: Brain: No evidence of acute infarction, hemorrhage, mass, mass effect, or midline shift. No hydrocephalus or extra-axial collection. Age-related cerebral volume loss. Periventricular white matter changes, likely the sequela of chronic small vessel ischemic disease. Vascular: No hyperdense vessel. Skull: Negative for fracture or focal lesion. Sinuses/Orbits: No acute finding. Status post bilateral lens replacements. Other: The mastoid air cells are well aerated. ASPECTS [facility] Stroke Program Early CT Score) - Ganglionic level infarction (caudate, lentiform nuclei, internal capsule, insula, M1-M3 cortex): 7 - Supraganglionic infarction (M4-M6 cortex): 3 Total score (0-10 with 10 being normal): 10 IMPRESSION: No acute intracranial process. ASPECTS is 10. Imaging results were communicated on 08/24/2023 at 6:35 pm to provider Dr. Lindzen via secure text paging. Electronically Signed   By: Donald Campion M.D.   On: 08/24/2023 18:35   DG Chest 2 View Result Date: 08/24/2023 CLINICAL DATA:  Cough.  Gallbladder carcinoma. EXAM: CHEST - 2 VIEW  COMPARISON:  04/10/2020 FINDINGS: Mild cardiomegaly and ectasia of thoracic aorta noted. Mild scarring is seen in the left lung base The lungs are otherwise clear. Numerous surgical clips seen in the left anterior chest wall soft tissues. IMPRESSION: Mild cardiomegaly.  No active lung disease. Electronically Signed   By: Norleen DELENA Kil M.D.   On: 08/24/2023 11:36    Scheduled Meds:   stroke: early stages of recovery book   Does not apply Once   escitalopram   10 mg Oral Daily   levothyroxine   75 mcg Oral Q0600   Continuous Infusions:   LOS: 1 day    Time spent:  55min  Jhana Giarratano, DO [facility]  To contact the attending physician between 7A-7P please use Epic Chat. To contact the covering physician during after hours 7P-7A, please review Amion.   08/25/2023, 1:18 PM   *This document has been created with the assistance of dictation software. Please excuse typographical errors. *

## 2023-08-25 NOTE — ED Notes (Signed)
 Pt's HCPOA declined all morning labs

## 2023-08-25 NOTE — Assessment & Plan Note (Signed)
-   She required BiPAP and was later on tapered down to nasal cannula. - O2 protocol will be followed. - This is clearly secondary to her acute submassive PE.

## 2023-08-25 NOTE — Progress Notes (Signed)
 S: Heparin  stopped early this AM due to family wishes to pursue comfort care. Currently on morphine . Sleeping soundly.  O: BP 108/65   Pulse 81   Temp 98.3 F (36.8 C) (Axillary)   Resp 20   Ht 5' 2 (1.575 m)   Wt 60.4 kg   SpO2 98%   BMI 24.35 kg/m  O2 sats 9$% on 2L.   Exam: Sleeping peacefully with regular respirations. Head turned to the left. NAD. Good skin color.   A/R: 89 year old female presenting with PE and symptoms/signs most consistent with an acute right parietal lobe ischemic infarction - Family wishes to pursue comfort care at this time - No further neurological work up or medical management of her stroke is indicated given comfort care status.  - Neurohospitalist service will be available as needed.   Electronically signed: Dr. Marisa Hufstetler

## 2023-08-25 NOTE — TOC CM/SW Note (Signed)
 Patient has an open referral with [facility] from [facility] Palliative NP Josh. MD has met with patient/family and chose comfort care. [facility] Randine and [place] to follow up.  Addie Cederberg, LCSW Transitions of Care Department [phone]

## 2023-08-25 NOTE — Assessment & Plan Note (Signed)
 -  We will continue Synthroid.

## 2023-08-25 NOTE — ED Notes (Addendum)
 Family requesting morphine and ativan for pt being uncomfortable. Pt moaning and grimacing. Pt unable to tell this RN where pain is located. Pt not opening her eyes at this time. Pt have even respirations.

## 2023-08-25 NOTE — ED Notes (Signed)
 Rounded on pt, snoring respirations with wet secretions noted. Manuela Schwartz NP paged, call returned. After SBAR communication new orders were discussed and will be executed

## 2023-08-25 NOTE — Assessment & Plan Note (Signed)
-   We will continue Lexapro. 

## 2023-08-25 NOTE — Assessment & Plan Note (Signed)
-   This is clearly acute right sided ischemic stroke with subsequent left-sided hemiparesis. - Her muscle strength has actually been improving. - She continues to have slow speech and left facial droop. - Neurology consult was obtained by Dr. Lindzen who will be following. - The family declined brain MRI. - A 2D echo with bubble study will be obtained. - She will be placed on baby aspirin .

## 2023-08-25 NOTE — ED Notes (Addendum)
 POA refusing vital signs and NIHSS.

## 2023-08-25 NOTE — Progress Notes (Signed)
 PT Cancellation Note  Patient Details Name: Jennifer Bernard MRN: [id number] DOB: 04/04/1927   Cancelled Treatment:    Reason Eval/Treat Not Completed: Medical issues which prohibited therapy (per facility policy, will await 48hrs therapeutic anticoagulation in the setting of acute PE, prior to inititation of PT services. Acknowledge recent family wishes for pt comfort- will plan on discussing role of PT in acute setting to determine if evaluation remains warranted at that time. Will continue to follow.   8:24 AM, 08/25/23 Peggye JAYSON Linear, PT, DPT Physical Therapist - [facility]  Outpatient Physical Therapy- Main Campus [phone]      Lenix Benoist C 08/25/2023, 8:23 AM

## 2023-08-26 DIAGNOSIS — C23 Malignant neoplasm of gallbladder: Secondary | ICD-10-CM | POA: Diagnosis not present

## 2023-08-26 DIAGNOSIS — F325 Major depressive disorder, single episode, in full remission: Secondary | ICD-10-CM

## 2023-08-26 DIAGNOSIS — I639 Cerebral infarction, unspecified: Secondary | ICD-10-CM | POA: Diagnosis not present

## 2023-08-26 LAB — URINE CULTURE: Culture: NO GROWTH

## 2023-08-26 MED ORDER — MORPHINE SULFATE (PF) 2 MG/ML IV SOLN
1.0000 mg | INTRAVENOUS | Status: DC | PRN
  Administered 2023-08-26 – 2023-08-28 (×7): 1 mg via INTRAVENOUS
  Filled 2023-08-26 (×9): qty 1

## 2023-08-26 NOTE — Plan of Care (Signed)
  Problem: Education: Goal: Knowledge of disease or condition will improve Outcome: Progressing Goal: Knowledge of secondary prevention will improve (MUST DOCUMENT ALL) Outcome: Progressing Goal: Knowledge of patient specific risk factors will improve Loraine Leriche N/A or DELETE if not current risk factor) Outcome: Progressing   Problem: Ischemic Stroke/TIA Tissue Perfusion: Goal: Complications of ischemic stroke/TIA will be minimized Outcome: Progressing   Problem: Coping: Goal: Will verbalize positive feelings about self Outcome: Progressing Goal: Will identify appropriate support needs Outcome: Progressing   Problem: Health Behavior/Discharge Planning: Goal: Ability to manage health-related needs will improve Outcome: Progressing Goal: Goals will be collaboratively established with patient/family Outcome: Progressing   Problem: Self-Care: Goal: Ability to participate in self-care as condition permits will improve Outcome: Progressing Goal: Verbalization of feelings and concerns over difficulty with self-care will improve Outcome: Progressing Goal: Ability to communicate needs accurately will improve Outcome: Progressing   Problem: Nutrition: Goal: Risk of aspiration will decrease Outcome: Progressing Goal: Dietary intake will improve Outcome: Progressing   Problem: Education: Goal: Knowledge of the prescribed therapeutic regimen will improve Outcome: Progressing   Problem: Coping: Goal: Ability to identify and develop effective coping behavior will improve Outcome: Progressing   Problem: Clinical Measurements: Goal: Quality of life will improve Outcome: Progressing   Problem: Respiratory: Goal: Verbalizations of increased ease of respirations will increase Outcome: Progressing   Problem: Role Relationship: Goal: Family's ability to cope with current situation will improve Outcome: Progressing Goal: Ability to verbalize concerns, feelings, and thoughts to partner or  family member will improve Outcome: Progressing   Problem: Pain Management: Goal: Satisfaction with pain management regimen will improve Outcome: Progressing   Problem: Education: Goal: Knowledge of General Education information will improve Description: Including pain rating scale, medication(s)/side effects and non-pharmacologic comfort measures Outcome: Progressing   Problem: Health Behavior/Discharge Planning: Goal: Ability to manage health-related needs will improve Outcome: Progressing   Problem: Clinical Measurements: Goal: Ability to maintain clinical measurements within normal limits will improve Outcome: Progressing Goal: Will remain free from infection Outcome: Progressing Goal: Diagnostic test results will improve Outcome: Progressing Goal: Respiratory complications will improve Outcome: Progressing Goal: Cardiovascular complication will be avoided Outcome: Progressing   Problem: Activity: Goal: Risk for activity intolerance will decrease Outcome: Progressing   Problem: Nutrition: Goal: Adequate nutrition will be maintained Outcome: Progressing   Problem: Coping: Goal: Level of anxiety will decrease Outcome: Progressing   Problem: Elimination: Goal: Will not experience complications related to bowel motility Outcome: Progressing Goal: Will not experience complications related to urinary retention Outcome: Progressing   Problem: Pain Management: Goal: General experience of comfort will improve Outcome: Progressing   Problem: Safety: Goal: Ability to remain free from injury will improve Outcome: Progressing   Problem: Skin Integrity: Goal: Risk for impaired skin integrity will decrease Outcome: Progressing

## 2023-08-26 NOTE — Plan of Care (Signed)
  Problem: Education: Goal: Knowledge of disease or condition will improve Outcome: Progressing   Problem: Ischemic Stroke/TIA Tissue Perfusion: Goal: Complications of ischemic stroke/TIA will be minimized Outcome: Progressing   Problem: Coping: Goal: Will identify appropriate support needs Outcome: Progressing   Problem: Self-Care: Goal: Ability to participate in self-care as condition permits will improve Outcome: Progressing

## 2023-08-26 NOTE — Progress Notes (Addendum)
 [facility] Liaison Note  Follow up on new referral for home hospice upon discharge from the hospital.  Patient was re-hospitalized prior to hospice admission visit.    DME to be delivered on Monday and family making arrangements for additional assistance in the home.  Hospital liaison team will continue to follow through final disposition.  Please do not hesitate to call with any hospice related questions or concerns.  Saddie HILARIO Na, RN Nurse Liaison [phone]

## 2023-08-26 NOTE — Progress Notes (Signed)
 PROGRESS NOTE    Jennifer Bernard  FMW:[id number] DOB: [birth year] DOA: 08/24/2023 PCP: Glendia Shad, MD  Chief Complaint  Patient presents with   Code Stroke    Hospital Course:  Jennifer Bernard is 89 y.o. 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  In the ED patient was found to be hypoxic to 89% on room air and tachypneic.  Labs also revealed leukocytosis, elevated expiratory troponin and BNP of 716.  EKG was normal sinus rhythm with prolonged QT interval.  Chest x-ray unremarkable.  Code stroke head CT revealed no acute intracranial abnormalities.  Neurology was consulted.  CTA head and neck revealed acute proximal right M2 branch occlusion.  Chest CT was positive for acute submassive pulmonary embolism involving all lobes with CT evidence of right heart strain.  Patient was initiated on IV heparin .  Family was offered tPA or thrombectomy but ultimately family elected not to pursue further aggressive management.  After extensive goals of care conversations, family elected to make patient comfort measures only and proceed with hospice care.  Subjective: Temp 100.2 this AM.  Daughter-in-law Darice, son [name] at bedside.  Discussed signs of end-of-life.  Answered all questions to the best of my ability.  The patient is sleeping.  Breathing is nonlabored.  Family reports she has not awoken since admission  Objective: Vitals:   08/25/23 0530 08/25/23 0600 08/25/23 0715 08/26/23 0806  BP:    137/61  Pulse: 84 92 81 97  Resp: (!) 23 (!) 25 20 18   Temp:    100.2 F (37.9 C)  TempSrc:      SpO2: 99% 100% 98% (!) 88%  Weight:      Height:       No intake or output data in the 24 hours ending 08/26/23 0813  Filed Weights    08/24/23 2100 08/24/23 2117  Weight: 60.8 kg 60.4 kg    Examination: General exam: Appears calm and comfortable, NAD Respiratory system: No work of breathing, symmetric chest wall expansion, on 2L currently Cardiovascular system: S1 & S2 heard, RRR.  Extremities:  anasarca in hands Gastrointestinal system: Abdomen is nondistended, soft and nontender.  Neuro: sleeping peacefully, does not arouse to stimuli Psychiatry: calm.  Assessment & Plan:  Principal Problem:   Acute pulmonary embolism with acute cor pulmonale (HCC) Active Problems:   Acute CVA (cerebrovascular accident) (HCC)   Acute respiratory failure with hypoxia (HCC)   Primary gall bladder adenocarcinoma (HCC)   Hypothyroidism   Essential hypertension   GERD without esophagitis   Depression    Comfort measures only - TOC consulted.  Working on hospice arrangements at patient's home.  Likely discharge with home hospice Monday when DME has been arranged - Proceed with as needed morphine  for air hunger and pain - As needed Ativan  for anxiety - Glycopyrrolate  for secretions - Turn off all alarms, discontinue all lab draws - q shift only vitals  Submassive acute pulmonary embolism with acute cor pulmonale - Initially Managed with IV heparin .  This has since been discontinued. - Patient is hypercoagulable due to recently diagnosed malignancy, suspect that this contributed.  Acute CVA - Acute proximal right M2 branch occlusion seen on CTA head and neck - Neurology initially consulted, family declined brain MRI, tPA, thrombectomy.  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  PRNs available     Code Status: Do not attempt resuscitation (DNR) - Comfort care Family Communication: Discussed with [name] and Darice at  bedside. Disposition:  Status is: Inpatient, pending home hospice arrangements. Like discharge Monday    Consultants:    Treatment Team:  Consulting Physician: Merrianne Locus, MD  Procedures:    Antimicrobials:  Anti-infectives (From admission, onward)    Start     Dose/Rate Route Frequency Ordered Stop   08/24/23 2130  cefTRIAXone  (ROCEPHIN ) 1 g in sodium chloride  0.9 % 100 mL IVPB        1 g 200 mL/hr over 30 Minutes Intravenous  Once 08/24/23 2125 08/24/23 2226       Data Reviewed: I have personally reviewed following labs and imaging studies CBC: Recent Labs  Lab 08/24/23 1851  WBC 17.4*  NEUTROABS 13.4*  HGB 11.7*  HCT 37.5  MCV 84.5  PLT PLATELET CLUMPS NOTED ON SMEAR, UNABLE TO ESTIMATE   Basic Metabolic Panel: Recent Labs  Lab 08/24/23 1851  NA 137  K 4.3  CL 103  CO2 20*  GLUCOSE 157*  BUN 31*  CREATININE 1.16*  CALCIUM 8.9  MG 2.0   GFR: Estimated Creatinine Clearance: 25.4 mL/min (A) (by C-G formula based on SCr of 1.16 mg/dL (H)). Liver Function Tests: Recent Labs  Lab 08/24/23 1851  AST 35  ALT 14  ALKPHOS 101  BILITOT 1.0  PROT 6.9  ALBUMIN 3.1*   CBG: Recent Labs  Lab 08/24/23 1819  GLUCAP 126*    No results found for this or any previous visit (from the past 240 hours).   Radiology Studies: CT ANGIO HEAD NECK W WO CM (CODE STROKE) Result Date: 08/24/2023 CLINICAL DATA:  Initial evaluation for acute neuro deficit, stroke suspected. EXAM: CT ANGIOGRAPHY HEAD AND NECK WITH AND WITHOUT CONTRAST TECHNIQUE: Multidetector CT imaging of the head and neck was performed using the standard protocol during bolus administration of intravenous contrast. Multiplanar CT image reconstructions and MIPs were obtained to evaluate the vascular anatomy. Carotid stenosis measurements (when applicable) are obtained utilizing NASCET criteria, using the distal internal carotid diameter as the denominator. RADIATION DOSE REDUCTION: This exam was performed  according to the departmental dose-optimization program which includes automated exposure control, adjustment of the mA and/or kV according to patient size and/or use of iterative reconstruction technique. CONTRAST:  80mL OMNIPAQUE  IOHEXOL  350 MG/ML SOLN COMPARISON:  CT from earlier the same day. FINDINGS: CTA NECK FINDINGS Aortic arch: Examination mildly degraded by motion artifact, somewhat limiting assessment. Visualized aortic arch within normal limits for caliber with standard branch pattern. Mild aortic atherosclerosis. No significant stenosis about the origin the great vessels. Right carotid system: Right common and internal carotid arteries are tortuous without dissection or stenosis. Left carotid system: Left common and internal carotid arteries are tortuous without stenosis or dissection. Vertebral arteries: Left vertebral artery diminutive in arises directly from the aortic arch. Vertebral arteries are patent without visible stenosis or dissection. Skeleton: No discrete or worrisome osseous lesions. Moderate spondylosis at C4-5 through C6-7. Other neck: No other acute soft tissue abnormality. There is question of a polypoid soft tissue lesion positioned at the central/posterior aspect of the upper subglottic airway measuring up to approximately 1.4 cm (series 1, images 54-52). Upper chest: Few scattered filling  defects noted within the proximal segmental pulmonary arteries bilaterally, consistent with acute pulmonary emboli. Review of the MIP images confirms the above findings CTA HEAD FINDINGS Anterior circulation: Both internal carotid arteries are patent through the siphons without stenosis. A1 segments patent bilaterally. Normal anterior communicating artery complex. Anterior cerebral arteries patent bilaterally. No M1 stenosis or occlusion. Distal left MCA branches perfused. On the right, there is acute occlusion of a proximal right M2 branch (series 3, image 222). Remainder of the right MCA branches  patent and perfused. Posterior circulation: Dominant right V4 segment widely patent. Hypoplastic left vertebral artery largely terminates in PICA, although a tiny branch ascending towards the vertebrobasilar junction. Left PICA patent. Right PICA origin not well seen. Basilar patent without stenosis. Superior cerebral arteries patent bilaterally. Left PCA supplied via the basilar. Predominant fetal type origin right PCA. Left PCA widely patent without stenosis. Focal severe proximal right P2 stenosis (series 5, image 22). Right PCA otherwise patent to its distal aspect. Venous sinuses: Not well assessed due to timing the contrast bolus. Anatomic variants: As above.  No aneurysm. Review of the MIP images confirms the above findings IMPRESSION: 1. Acute proximal right M2 branch occlusion. 2. Focal severe proximal right P2 stenosis. 3. Few scattered filling defects within the proximal segmental pulmonary arteries bilaterally, consistent with acute pulmonary emboli. 4. Question 1.4 cm polypoid soft tissue lesion positioned at the central/posterior aspect of the upper subglottic airway. Correlation with direct visualization/bronchoscopy suggested for further evaluation as warranted. 5.  Aortic Atherosclerosis (ICD10-I70.0). Critical Value/emergent results were called by telephone at the time of interpretation on 08/24/2023 at 8:39 pm to provider Dr. Willo, Who verbally acknowledged these results. Electronically Signed   By: Morene Hoard M.D.   On: 08/24/2023 20:44   CT Angio Chest PE W/Cm &/Or Wo Cm Result Date: 08/24/2023 CLINICAL DATA:  High probability for PE.  Slurred speech. EXAM: CT ANGIOGRAPHY CHEST WITH CONTRAST TECHNIQUE: Multidetector CT imaging of the chest was performed using the standard protocol during bolus administration of intravenous contrast. Multiplanar CT image reconstructions and MIPs were obtained to evaluate the vascular anatomy. RADIATION DOSE REDUCTION: This exam was performed according  to the departmental dose-optimization program which includes automated exposure control, adjustment of the mA and/or kV according to patient size and/or use of iterative reconstruction technique. CONTRAST:  80mL OMNIPAQUE  IOHEXOL  350 MG/ML SOLN COMPARISON:  PET-CT 07/31/2023. FINDINGS: Cardiovascular: There is adequate opacification of the pulmonary arteries. Pulmonary emboli are seen throughout segmental and subsegmental branches of all lobes bilaterally. There is no large central pulmonary embolism. The aorta is ectatic. The heart is enlarged. There is no pericardial effusion. Mediastinum/Nodes: No enlarged mediastinal, hilar, or axillary lymph nodes. There is a rounded enhancing right thyroid  nodule measuring 1 cm. Lungs/Pleura: Lungs are clear. No pleural effusion or pneumothorax. Upper Abdomen: Multiple hypodense hepatic masses are unchanged. Musculoskeletal: Left mastectomy changes are present. No acute osseous abnormality. Review of the MIP images confirms the above findings. IMPRESSION: 1. Positive for acute PE involving all lobes with CT evidence of right heart strain (RV/LV Ratio = 1.7 ) consistent with at least submassive (intermediate risk) PE. The presence of right heart strain has been associated with an increased risk of morbidity and mortality. Please refer to the Code PE Focused order set in EPIC. 2. Stable hepatic metastatic disease. 3. Aortic atherosclerosis. Aortic Atherosclerosis (ICD10-I70.0). Electronically Signed   By: Greig Pique M.D.   On: 08/24/2023 20:32   DG Chest Portable 1 View Result Date: 08/24/2023  CLINICAL DATA:  Shortness of breath, code stroke EXAM: PORTABLE CHEST 1 VIEW COMPARISON:  08/24/2023 FINDINGS: Stable cardiomediastinal silhouette. Aortic atherosclerotic calcification. No focal consolidation, pleural effusion, or pneumothorax. No displaced rib fractures. Elevated right hemidiaphragm. Surgical clips left breast. IMPRESSION: No acute cardiopulmonary disease.  Electronically Signed   By: Norman Gatlin M.D.   On: 08/24/2023 19:44   CT HEAD CODE STROKE WO CONTRAST Result Date: 08/24/2023 CLINICAL DATA:  Code stroke. EXAM: CT HEAD WITHOUT CONTRAST TECHNIQUE: Contiguous axial images were obtained from the base of the skull through the vertex without intravenous contrast. RADIATION DOSE REDUCTION: This exam was performed according to the departmental dose-optimization program which includes automated exposure control, adjustment of the mA and/or kV according to patient size and/or use of iterative reconstruction technique. COMPARISON:  07/25/2022 FINDINGS: Brain: No evidence of acute infarction, hemorrhage, mass, mass effect, or midline shift. No hydrocephalus or extra-axial collection. Age-related cerebral volume loss. Periventricular white matter changes, likely the sequela of chronic small vessel ischemic disease. Vascular: No hyperdense vessel. Skull: Negative for fracture or focal lesion. Sinuses/Orbits: No acute finding. Status post bilateral lens replacements. Other: The mastoid air cells are well aerated. ASPECTS [facility] Stroke Program Early CT Score) - Ganglionic level infarction (caudate, lentiform nuclei, internal capsule, insula, M1-M3 cortex): 7 - Supraganglionic infarction (M4-M6 cortex): 3 Total score (0-10 with 10 being normal): 10 IMPRESSION: No acute intracranial process. ASPECTS is 10. Imaging results were communicated on 08/24/2023 at 6:35 pm to provider Dr. Lindzen via secure text paging. Electronically Signed   By: Donald Campion M.D.   On: 08/24/2023 18:35   DG Chest 2 View Result Date: 08/24/2023 CLINICAL DATA:  Cough.  Gallbladder carcinoma. EXAM: CHEST - 2 VIEW COMPARISON:  04/10/2020 FINDINGS: Mild cardiomegaly and ectasia of thoracic aorta noted. Mild scarring is seen in the left lung base The lungs are otherwise clear. Numerous surgical clips seen in the left anterior chest wall soft tissues. IMPRESSION: Mild cardiomegaly.  No active lung  disease. Electronically Signed   By: Norleen DELENA Kil M.D.   On: 08/24/2023 11:36    Scheduled Meds:   stroke: early stages of recovery book   Does not apply Once   escitalopram   10 mg Oral Daily   levothyroxine   75 mcg Oral Q0600   Continuous Infusions:   LOS: 2 days    Time spent:  55min  Haruko Mersch, DO [facility]  To contact the attending physician between 7A-7P please use Epic Chat. To contact the covering physician during after hours 7P-7A, please review Amion.   08/26/2023, 8:13 AM   *This document has been created with the assistance of dictation software. Please excuse typographical errors. *

## 2023-08-27 DIAGNOSIS — K219 Gastro-esophageal reflux disease without esophagitis: Secondary | ICD-10-CM | POA: Diagnosis not present

## 2023-08-27 DIAGNOSIS — I639 Cerebral infarction, unspecified: Secondary | ICD-10-CM | POA: Diagnosis not present

## 2023-08-27 DIAGNOSIS — J9601 Acute respiratory failure with hypoxia: Secondary | ICD-10-CM | POA: Diagnosis not present

## 2023-08-27 NOTE — Progress Notes (Deleted)
 [facility] Liaison Note  Follow up on new referral for home hospice upon discharge from the hospital.  Patient was re-hospitalized prior to hospice admission visit.    DME to be delivered on Monday and family making arrangements for additional assistance in the home.  Hospital liaison team will continue to follow through final disposition.  Please do not hesitate to call with any hospice related questions or concerns.  Saddie HILARIO Na, RN Nurse Liaison [phone]

## 2023-08-27 NOTE — Progress Notes (Signed)
 [facility] Liaison Note  Follow up on new hospice referral who is waiting for discharge home/[facility] tomorrow after DME delivered.    Upon entrance to the room, patient was dyspneic with irregular rate using accessory muscles to breath with abdominal pulling.  Patient's eyes are sunken, cyanotic hands, unresponsive to verbal and tactile stimuli.  Patient appears to be actively dying.  Spoke with the family at length regarding symptoms and my assessment and informed them that patient is actively transitioning.  Discussed discharge options in the light of patient's status.    Nurse has been giving IV medications today for secretions, anxiety and shortness of breath.  Family was trying to honor patient's wishes of going back to her apartment with hospice and help from [facility] staying 24/7.  Patient is requiring frequent IV meds for symptoms.  Discussed IPU as an option.  Unsure if patient will survive the night.  Will follow up in the morning to re-assess patient's status.  Please do not hesitate to call with any hospice related questions or concerns.  Saddie HILARIO Na, RN Nurse Liaison [phone]

## 2023-08-27 NOTE — Plan of Care (Signed)
  Problem: Ischemic Stroke/TIA Tissue Perfusion: Goal: Complications of ischemic stroke/TIA will be minimized Outcome: Progressing   Problem: Health Behavior/Discharge Planning: Goal: Goals will be collaboratively established with patient/family Outcome: Progressing   Problem: Health Behavior/Discharge Planning: Goal: Ability to manage health-related needs will improve Outcome: Not Progressing   Problem: Self-Care: Goal: Ability to participate in self-care as condition permits will improve Outcome: Not Progressing   Problem: Nutrition: Goal: Dietary intake will improve Outcome: Not Progressing

## 2023-08-27 NOTE — Progress Notes (Signed)
 PROGRESS NOTE    Jennifer Bernard  FMW:[id number] DOB: August 24, 1926 DOA: 08/24/2023 PCP: Glendia Shad, MD  Chief Complaint  Patient presents with   Code Stroke    Hospital Course:  Jennifer Bernard is 89 y.o. 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  In the ED patient was found to be hypoxic to 89% on room air and tachypneic.  Labs also revealed leukocytosis, elevated expiratory troponin and BNP of 716.  EKG was normal sinus rhythm with prolonged QT interval.  Chest x-ray unremarkable.  Code stroke head CT revealed no acute intracranial abnormalities.  Neurology was consulted.  CTA head and neck revealed acute proximal right M2 branch occlusion.  Chest CT was positive for acute submassive pulmonary embolism involving all lobes with CT evidence of right heart strain.  Patient was initiated on IV heparin .  Family was offered tPA or thrombectomy but ultimately family elected not to pursue further aggressive management.  After extensive goals of care conversations, family elected to make patient comfort measures only and proceed with hospice care.  Subjective: No acute events overnight. On evaluation today patient is resting easily. Son at bedside.  Objective: Vitals:   08/26/23 1300 08/26/23 2052 08/26/23 2055 08/27/23 0804  BP:  103/63  122/74  Pulse:  (!) 105 98 (!) 104  Resp:    14  Temp: 99.7 F (37.6 C) 99 F (37.2 C)  100.2 F (37.9 C)  TempSrc: Axillary     SpO2:  (!) 78% (!) 89% (!) 76%  Weight:      Height:        Intake/Output Summary (Last 24 hours) at 08/27/2023 1450 Last data filed at 08/26/2023 1800 Gross per 24 hour  Intake --  Output 150 ml  Net -150 ml    Filed Weights   08/24/23 2100 08/24/23  2117  Weight: 60.8 kg 60.4 kg    Examination: General exam: Appears calm and comfortable, NAD Respiratory system: No work of breathing, symmetric chest wall expansion, no airway sounds Cardiovascular system: S1 & S2 heard, RRR.  Gastrointestinal system: Abdomen is nondistended, soft and nontender.  Neuro: sleeping peacefully, does not arouse to stimuli Psychiatry: calm.  Assessment & Plan:  Principal Problem:   Acute pulmonary embolism with acute cor pulmonale (HCC) Active Problems:   Acute CVA (cerebrovascular accident) (HCC)   Acute respiratory failure with hypoxia (HCC)   Primary gall bladder adenocarcinoma (HCC)   Hypothyroidism   Essential hypertension   GERD without esophagitis   Depression    Comfort measures only - TOC consulted.  Working on hospice arrangements at patient's home.  Likely discharge with home hospice Monday when DME has been arranged - Proceed with as needed morphine  for air hunger and pain - As needed Ativan  for anxiety - Glycopyrrolate  for secretions - Turn off all alarms, discontinue all lab draws - q shift only vitals  Submassive acute pulmonary embolism with acute cor pulmonale - Initially Managed with IV heparin .  This has since been discontinued. - Patient is hypercoagulable due to recently diagnosed malignancy, suspect that this contributed.  Acute CVA - Acute proximal right M2 branch occlusion seen on CTA head and neck - Neurology initially consulted, family declined brain MRI, tPA, thrombectomy.  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  PRNs available     Code Status: Do not attempt resuscitation (DNR) - Comfort care Family Communication: Discussed with [name] and Darice at bedside. Disposition:  Status is: Inpatient, pending home  hospice arrangements. Likely discharge Monday    Consultants:      Procedures:    Antimicrobials:  Anti-infectives (From admission, onward)    Start     Dose/Rate Route Frequency Ordered Stop   08/24/23 2130  cefTRIAXone  (ROCEPHIN ) 1 g in sodium chloride  0.9 % 100 mL IVPB        1 g 200 mL/hr over 30 Minutes Intravenous  Once 08/24/23 2125 08/24/23 2226       Data Reviewed: I have personally reviewed following labs and imaging studies CBC: Recent Labs  Lab 08/24/23 1851  WBC 17.4*  NEUTROABS 13.4*  HGB 11.7*  HCT 37.5  MCV 84.5  PLT PLATELET CLUMPS NOTED ON SMEAR, UNABLE TO ESTIMATE   Basic Metabolic Panel: Recent Labs  Lab 08/24/23 1851  NA 137  K 4.3  CL 103  CO2 20*  GLUCOSE 157*  BUN 31*  CREATININE 1.16*  CALCIUM 8.9  MG 2.0   GFR: Estimated Creatinine Clearance: 25.4 mL/min (A) (by C-G formula based on SCr of 1.16 mg/dL (H)). Liver Function Tests: Recent Labs  Lab 08/24/23 1851  AST 35  ALT 14  ALKPHOS 101  BILITOT 1.0  PROT 6.9  ALBUMIN 3.1*   CBG: Recent Labs  Lab 08/24/23 1819  GLUCAP 126*    Recent Results (from the past 240 hours)  Urine Culture     Status: None   Collection Time: 08/24/23  8:55 PM   Specimen: Urine, Random  Result Value Ref Range Status   Specimen Description   Final    URINE, RANDOM Performed at [facility], [address], [place], [place] [address]    Special Requests   Final    NONE Performed at [facility], [address], [place], [place] [address]    Culture   Final    NO GROWTH Performed at [facility], [address], [place], [place] [address]    Report Status 08/26/2023 FINAL  Final     Radiology Studies: No results found.   Scheduled Meds:   stroke: early stages of recovery book   Does not apply Once   escitalopram   10 mg Oral Daily   levothyroxine   75 mcg Oral Q0600   Continuous Infusions:   LOS: 3 days    Time spent:  55min  Janea Schwenn,  DO [facility]  To contact the attending physician between 7A-7P please use Epic Chat. To contact the covering physician during after hours 7P-7A, please review Amion.   08/27/2023, 2:50 PM   *This document has been created with the assistance of dictation software. Please excuse typographical errors. *

## 2023-08-28 DIAGNOSIS — I10 Essential (primary) hypertension: Secondary | ICD-10-CM

## 2023-08-28 DIAGNOSIS — I639 Cerebral infarction, unspecified: Secondary | ICD-10-CM | POA: Diagnosis not present

## 2023-08-28 DIAGNOSIS — K219 Gastro-esophageal reflux disease without esophagitis: Secondary | ICD-10-CM | POA: Diagnosis not present

## 2023-08-28 LAB — BLOOD GAS, VENOUS
Acid-base deficit: 0.6 mmol/L (ref 0.0–2.0)
Bicarbonate: 23.2 mmol/L (ref 20.0–28.0)
O2 Saturation: 24.1 %
Patient temperature: 37
pCO2, Ven: 35 mm[Hg] — ABNORMAL LOW (ref 44–60)
pH, Ven: 7.43 (ref 7.25–7.43)

## 2023-08-28 MED ORDER — MORPHINE 100MG IN NS 100ML (1MG/ML) PREMIX INFUSION
1.0000 mg/h | INTRAVENOUS | Status: DC
  Administered 2023-08-28: 1 mg/h via INTRAVENOUS
  Administered 2023-08-28: 3 mg/h via INTRAVENOUS
  Administered 2023-08-28: 2 mg/h via INTRAVENOUS
  Filled 2023-08-28: qty 100

## 2023-08-28 MED ORDER — MORPHINE SULFATE 1 MG/ML IV SOLN PCA: INTRAVENOUS | Status: DC

## 2023-09-01 ENCOUNTER — Telehealth: Payer: Self-pay

## 2023-09-01 NOTE — Telephone Encounter (Signed)
Copied from CRM [id number]. Topic: General - Other >> Sep 01, 2023  8:58 AM Elizebeth Brooking wrote: Reason for CRM: [facility] [employer] [facility] [employer] from [employer] [facility], called requesting status pertaining to the death certificate would like a callback at [phone] Per CAL stated to send crm to Clinical pool and inform  Sink it can take up to 24 hours

## 2023-09-01 NOTE — Telephone Encounter (Signed)
Called [facility] home to clarify. Jennifer Bernard states that your information was given to them because you agreed to be hospice attending. They do not have any info on the hospitalist that was on shift at [facility]. She says that if there is an issue we could contact the nursing supervisor. Let me know if I need to do anything.

## 2023-09-02 NOTE — Telephone Encounter (Signed)
Notify - case has been certified.  Death certificate certified.

## 2023-09-04 NOTE — Telephone Encounter (Signed)
Noted  

## 2023-09-16 NOTE — Discharge Summary (Signed)
 Physician Discharge Summary   Patient: Jennifer Bernard MRN: [id number] DOB: 1927/02/08  Admit date:     08/24/2023  Discharge date: 09/15/22  Discharge Physician: Lorane Poland   PCP: Glendia Shad, MD    Discharge Diagnoses: Principal Problem:   Acute pulmonary embolism with acute cor pulmonale (HCC) Active Problems:   Acute CVA (cerebrovascular accident) (HCC)   Acute respiratory failure with hypoxia (HCC)   Primary gall bladder adenocarcinoma (HCC)   Hypothyroidism   Essential hypertension   GERD without esophagitis   Depression  Resolved Problems:   * No resolved hospital problems. *  Hospital Course: Jennifer Bernard was 89 y.o. 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  In the ED patient was found to be hypoxic to 89% on room air and tachypneic.  Labs also revealed leukocytosis, elevated HS troponin and BNP of 716.  EKG was normal sinus rhythm with prolonged QT interval.  Chest x-ray unremarkable.  Code stroke head CT revealed no acute intracranial abnormalities.  Neurology was consulted.  CTA head and neck revealed acute proximal right M2 branch occlusion.  Chest CT was positive for acute submassive pulmonary embolism involving all lobes with CT evidence of right heart strain.  Patient was initiated on IV heparin .  Family was offered tPA or thrombectomy but ultimately family elected not to pursue further aggressive management.  After extensive goals of care conversations, family elected to make patient comfort measures only and proceed with hospice care.  Patient was admitted and managed as comfort measures only.  Originally planned for discharge to hospice at home, however, the patient clinically decompensated  and was unstable for transfer. Ultimately patient was pronounced deceased at 09/16/2023 at August 10, 2111.  She was surrounded by her family.        Consultants: Neurology Procedures performed: n/a  Disposition:  Deceased  Imaging Studies: CT ANGIO HEAD NECK W WO CM (CODE STROKE) Result Date: 08/24/2023 CLINICAL DATA:  Initial evaluation for acute neuro deficit, stroke suspected. EXAM: CT ANGIOGRAPHY HEAD AND NECK WITH AND WITHOUT CONTRAST TECHNIQUE: Multidetector CT imaging of the head and neck was performed using the standard protocol during bolus administration of intravenous contrast. Multiplanar CT image reconstructions and MIPs were obtained to evaluate the vascular anatomy. Carotid stenosis measurements (when applicable) are obtained utilizing NASCET criteria, using the distal internal carotid diameter as the denominator. RADIATION DOSE REDUCTION: This exam was performed according to the departmental dose-optimization program which includes automated exposure control, adjustment of the mA and/or kV according to patient size and/or use of iterative reconstruction technique. CONTRAST:  80mL OMNIPAQUE  IOHEXOL  350 MG/ML SOLN COMPARISON:  CT from earlier the same day. FINDINGS: CTA NECK FINDINGS Aortic arch: Examination mildly degraded by motion artifact, somewhat limiting assessment. Visualized aortic arch within normal limits for caliber with standard branch pattern. Mild aortic atherosclerosis. No significant stenosis about the origin the great vessels. Right carotid system: Right common and internal carotid arteries are tortuous without dissection or stenosis. Left carotid system: Left common and internal carotid arteries are tortuous without stenosis or dissection. Vertebral arteries: Left vertebral artery diminutive in arises directly from the aortic arch. Vertebral arteries are patent without visible stenosis or dissection. Skeleton: No discrete or worrisome osseous lesions. Moderate spondylosis at C4-5  through C6-7. Other neck: No  other acute soft tissue abnormality. There is question of a polypoid soft tissue lesion positioned at the central/posterior aspect of the upper subglottic airway measuring up to approximately 1.4 cm (series 1, images 54-52). Upper chest: Few scattered filling defects noted within the proximal segmental pulmonary arteries bilaterally, consistent with acute pulmonary emboli. Review of the MIP images confirms the above findings CTA HEAD FINDINGS Anterior circulation: Both internal carotid arteries are patent through the siphons without stenosis. A1 segments patent bilaterally. Normal anterior communicating artery complex. Anterior cerebral arteries patent bilaterally. No M1 stenosis or occlusion. Distal left MCA branches perfused. On the right, there is acute occlusion of a proximal right M2 branch (series 3, image 222). Remainder of the right MCA branches patent and perfused. Posterior circulation: Dominant right V4 segment widely patent. Hypoplastic left vertebral artery largely terminates in PICA, although a tiny branch ascending towards the vertebrobasilar junction. Left PICA patent. Right PICA origin not well seen. Basilar patent without stenosis. Superior cerebral arteries patent bilaterally. Left PCA supplied via the basilar. Predominant fetal type origin right PCA. Left PCA widely patent without stenosis. Focal severe proximal right P2 stenosis (series 5, image 22). Right PCA otherwise patent to its distal aspect. Venous sinuses: Not well assessed due to timing the contrast bolus. Anatomic variants: As above.  No aneurysm. Review of the MIP images confirms the above findings IMPRESSION: 1. Acute proximal right M2 branch occlusion. 2. Focal severe proximal right P2 stenosis. 3. Few scattered filling defects within the proximal segmental pulmonary arteries bilaterally, consistent with acute pulmonary emboli. 4. Question 1.4 cm polypoid soft tissue lesion positioned at the  central/posterior aspect of the upper subglottic airway. Correlation with direct visualization/bronchoscopy suggested for further evaluation as warranted. 5.  Aortic Atherosclerosis (ICD10-I70.0). Critical Value/emergent results were called by telephone at the time of interpretation on 08/24/2023 at 8:39 pm to provider Dr. Willo, Who verbally acknowledged these results. Electronically Signed   By: Morene Hoard M.D.   On: 08/24/2023 20:44   CT Angio Chest PE W/Cm &/Or Wo Cm Result Date: 08/24/2023 CLINICAL DATA:  High probability for PE.  Slurred speech. EXAM: CT ANGIOGRAPHY CHEST WITH CONTRAST TECHNIQUE: Multidetector CT imaging of the chest was performed using the standard protocol during bolus administration of intravenous contrast. Multiplanar CT image reconstructions and MIPs were obtained to evaluate the vascular anatomy. RADIATION DOSE REDUCTION: This exam was performed according to the departmental dose-optimization program which includes automated exposure control, adjustment of the mA and/or kV according to patient size and/or use of iterative reconstruction technique. CONTRAST:  80mL OMNIPAQUE  IOHEXOL  350 MG/ML SOLN COMPARISON:  PET-CT 07/31/2023. FINDINGS: Cardiovascular: There is adequate opacification of the pulmonary arteries. Pulmonary emboli are seen throughout segmental and subsegmental branches of all lobes bilaterally. There is no large central pulmonary embolism. The aorta is ectatic. The heart is enlarged. There is no pericardial effusion. Mediastinum/Nodes: No enlarged mediastinal, hilar, or axillary lymph nodes. There is a rounded enhancing right thyroid  nodule measuring 1 cm. Lungs/Pleura: Lungs are clear. No pleural effusion or pneumothorax. Upper Abdomen: Multiple hypodense hepatic masses are unchanged. Musculoskeletal: Left mastectomy changes are present. No acute osseous abnormality. Review of the MIP images confirms the above findings. IMPRESSION: 1. Positive for acute PE  involving all lobes with CT evidence of right heart strain (RV/LV Ratio = 1.7 ) consistent with at least submassive (intermediate risk) PE. The presence of right heart strain has been associated with an increased risk of morbidity and mortality. Please refer to the Code PE Focused  order set in EPIC. 2. Stable hepatic metastatic disease. 3. Aortic atherosclerosis. Aortic Atherosclerosis (ICD10-I70.0). Electronically Signed   By: Greig Pique M.D.   On: 08/24/2023 20:32   DG Chest Portable 1 View Result Date: 08/24/2023 CLINICAL DATA:  Shortness of breath, code stroke EXAM: PORTABLE CHEST 1 VIEW COMPARISON:  08/24/2023 FINDINGS: Stable cardiomediastinal silhouette. Aortic atherosclerotic calcification. No focal consolidation, pleural effusion, or pneumothorax. No displaced rib fractures. Elevated right hemidiaphragm. Surgical clips left breast. IMPRESSION: No acute cardiopulmonary disease. Electronically Signed   By: Norman Gatlin M.D.   On: 08/24/2023 19:44   CT HEAD CODE STROKE WO CONTRAST Result Date: 08/24/2023 CLINICAL DATA:  Code stroke. EXAM: CT HEAD WITHOUT CONTRAST TECHNIQUE: Contiguous axial images were obtained from the base of the skull through the vertex without intravenous contrast. RADIATION DOSE REDUCTION: This exam was performed according to the departmental dose-optimization program which includes automated exposure control, adjustment of the mA and/or kV according to patient size and/or use of iterative reconstruction technique. COMPARISON:  07/25/2022 FINDINGS: Brain: No evidence of acute infarction, hemorrhage, mass, mass effect, or midline shift. No hydrocephalus or extra-axial collection. Age-related cerebral volume loss. Periventricular white matter changes, likely the sequela of chronic small vessel ischemic disease. Vascular: No hyperdense vessel. Skull: Negative for fracture or focal lesion. Sinuses/Orbits: No acute finding. Status post bilateral lens replacements. Other: The  mastoid air cells are well aerated. ASPECTS [facility] Stroke Program Early CT Score) - Ganglionic level infarction (caudate, lentiform nuclei, internal capsule, insula, M1-M3 cortex): 7 - Supraganglionic infarction (M4-M6 cortex): 3 Total score (0-10 with 10 being normal): 10 IMPRESSION: No acute intracranial process. ASPECTS is 10. Imaging results were communicated on 08/24/2023 at 6:35 pm to provider Dr. Lindzen via secure text paging. Electronically Signed   By: Donald Campion M.D.   On: 08/24/2023 18:35   DG Chest 2 View Result Date: 08/24/2023 CLINICAL DATA:  Cough.  Gallbladder carcinoma. EXAM: CHEST - 2 VIEW COMPARISON:  04/10/2020 FINDINGS: Mild cardiomegaly and ectasia of thoracic aorta noted. Mild scarring is seen in the left lung base The lungs are otherwise clear. Numerous surgical clips seen in the left anterior chest wall soft tissues. IMPRESSION: Mild cardiomegaly.  No active lung disease. Electronically Signed   By: Norleen DELENA Kil M.D.   On: 08/24/2023 11:36   NM PET Image Initial (PI) Skull Base To Thigh (F-18 FDG) Result Date: 07/31/2023 CLINICAL DATA:  Initial treatment strategy for liver mass. EXAM: NUCLEAR MEDICINE PET SKULL BASE TO THIGH initial TECHNIQUE: 7.2 mCi F-18 FDG was injected intravenously. Full-ring PET imaging was performed from the skull base to thigh after the radiotracer. CT data was obtained and used for attenuation correction and anatomic localization. Fasting blood glucose: 104 mg/dl COMPARISON:  Noncontrast CT 07/05/2023 FINDINGS: NECK: No hypermetabolic lymph nodes in the neck. Incidental CT findings: None. CHEST: No hypermetabolic mediastinal or hilar nodes. No suspicious pulmonary nodules on the CT scan. Incidental CT findings: Post LEFT mastectomy anatomy ABDOMEN/PELVIS: Multiple round intense hypermetabolic lesions in the central LEFT hepatic lobe SUV max equal 18.4. Several round peripherally hypermetabolic lesions in the medial aspect of the RIGHT hepatic lobe on image  81 with SUV max equal 13.3. thickened rim of the gallbladder wall is intensely hypermetabolic with SUV max equal 19.9. This hypermetabolic gallbladder wall thickening measures 22 mm on image 91/6 Hypermetabolic lymph nodes in the porta hepatis poorly defined on the CT portion exam but intensely hypermetabolic SUV max equal 17. Small hypermetabolic nodule along the ventral peritoneal surface  midline below the umbilicus and above the pubic symphysis measures 9 mm with SUV max 8.4 image 128. Second small hypermetabolic peritoneal nodule measures 3 mm (image 120). third hypermetabolic ventral peritoneal nodule midline measures 6 mm on image 122 Incidental CT findings: Large LEFT renal calculus within the LEFT renal hilum. No hydronephrosis SKELETON: No focal hypermetabolic activity to suggest skeletal metastasis. Incidental CT findings: None. IMPRESSION: 1. Multiple intensely hypermetabolic lesions in the liver consistent with malignancy. Favor metastatic disease. 2. Hypermetabolic thickening of the gallbladder wall is most concerning for primary gallbladder carcinoma. 3. Hypermetabolic nodal metastasis in the porta hepatis. 4. Several small hypermetabolic nodules along the ventral peritoneal space are concerning for peritoneal metastasis. These results will be called to the ordering clinician or representative by the Radiologist Assistant, and communication documented in the PACS or [employer]. Electronically Signed   By: Jackquline Boxer M.D.   On: 07/31/2023 12:38    Microbiology: Results for orders placed or performed during the hospital encounter of 08/24/23  Urine Culture     Status: None   Collection Time: 08/24/23  8:55 PM   Specimen: Urine, Random  Result Value Ref Range Status   Specimen Description   Final    URINE, RANDOM Performed at [facility], [address], [place], [place] [address]    Special Requests   Final    NONE Performed at [facility], [address], [place], [place] [address]    Culture   Final    NO GROWTH Performed at [facility], [address], [place], [place] [address]    Report Status 08/26/2023 FINAL  Final    Labs: CBC: Recent Labs  Lab 08/24/23 1851  WBC 17.4*  NEUTROABS 13.4*  HGB 11.7*  HCT 37.5  MCV 84.5  PLT PLATELET CLUMPS NOTED ON SMEAR, UNABLE TO ESTIMATE   Basic Metabolic Panel: Recent Labs  Lab 08/24/23 1851  NA 137  K 4.3  CL 103  CO2 20*  GLUCOSE 157*  BUN 31*  CREATININE 1.16*  CALCIUM 8.9  MG 2.0   Liver Function Tests: Recent Labs  Lab 08/24/23 1851  AST 35  ALT 14  ALKPHOS 101  BILITOT 1.0  PROT 6.9  ALBUMIN 3.1*   CBG: Recent Labs  Lab 08/24/23 1819  GLUCAP 126*    Discharge time spent: less than 30 minutes.  Signed: Faustine Tates, DO [facility] 08/29/2023

## 2023-09-16 NOTE — Plan of Care (Signed)
  Problem: Health Behavior/Discharge Planning: Goal: Ability to manage health-related needs will improve Outcome: Progressing   Problem: Self-Care: Goal: Verbalization of feelings and concerns over difficulty with self-care will improve Outcome: Progressing   Problem: Self-Care: Goal: Ability to communicate needs accurately will improve Outcome: Progressing   Problem: Education: Goal: Knowledge of the prescribed therapeutic regimen will improve Outcome: Progressing

## 2023-09-16 NOTE — Progress Notes (Signed)
 PROGRESS NOTE    Jennifer Bernard  FMW:[id number] DOB: Jun 22, 1927 DOA: 08/24/2023 PCP: Glendia Shad, MD  Chief Complaint  Patient presents with   Code Stroke    Hospital Course:  Jennifer Bernard is 89 y.o. 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  In the ED patient was found to be hypoxic to 89% on room air and tachypneic.  Labs also revealed leukocytosis, elevated expiratory troponin and BNP of 716.  EKG was normal sinus rhythm with prolonged QT interval.  Chest x-ray unremarkable.  Code stroke head CT revealed no acute intracranial abnormalities.  Neurology was consulted.  CTA head and neck revealed acute proximal right M2 branch occlusion.  Chest CT was positive for acute submassive pulmonary embolism involving all lobes with CT evidence of right heart strain.  Patient was initiated on IV heparin .  Family was offered tPA or thrombectomy but ultimately family elected not to pursue further aggressive management.  After extensive goals of care conversations, family elected to make patient comfort measures only and proceed with hospice care.  Subjective: With some increasing tachypnea today.    All of patient's children are with her at bedside.  Objective: Vitals:   08/27/23 0804 08/27/23 2035 September 09, 2023 0835 09-Sep-2023 1442  BP: 122/74 137/65 118/62   Pulse: (!) 104 (!) 108 (!) 107   Resp: 14  (!) 40 (!) 40  Temp: 100.2 F (37.9 C) 97.7 F (36.5 C) 99 F (37.2 C)   TempSrc:      SpO2: (!) 76% (!) 84% (!) 81%   Weight:      Height:       No intake or output data in the 24 hours ending 09-09-23 1744   Filed Weights   08/24/23 2100 08/24/23 2117  Weight: 60.8 kg 60.4 kg    Examination: General exam: Appears calm  and comfortable, NAD Respiratory system: Increasing tachypnea, symmetric chest wall expansion, no airway sounds Cardiovascular system: S1 & S2 heard, RRR.  Gastrointestinal system: Abdomen is nondistended, soft and nontender.  Neuro: sleeping peacefully, does not arouse to stimuli Psychiatry: calm.  Assessment & Plan:  Principal Problem:   Acute pulmonary embolism with acute cor pulmonale (HCC) Active Problems:   Acute CVA (cerebrovascular accident) (HCC)   Acute respiratory failure with hypoxia (HCC)   Primary gall bladder adenocarcinoma (HCC)   Hypothyroidism   Essential hypertension   GERD without esophagitis   Depression    Comfort measures only - Patient has increasing tachypnea today, we have initiated morphine  drip to keep her more comfortable. - Given that she is requiring IV morphine  24/7, we do suspect she is actively transitioning and is unlikely to stabilize enough for transfer to outpatient hospice.  I have discussed this with the family at bedside and they are in agreement with allowing the patient to spend her final days here with us  in the hospital - As needed Ativan  for anxiety - Glycopyrrolate  for secretions - Turn off all alarms, discontinue all lab draws - q shift only vitals  Submassive acute pulmonary embolism with acute cor pulmonale - Initially Managed with IV heparin .  This has since been discontinued. - Patient is hypercoagulable due to recently diagnosed malignancy, suspect that this contributed.  Acute CVA - Acute proximal right M2 branch occlusion  seen on CTA head and neck - Neurology initially consulted, family declined brain MRI, tPA, thrombectomy.  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  PRNs available     Code Status: Do not attempt  resuscitation (DNR) - Comfort care Family Communication: All of the patient's children are at bedside.   Consultants:      Procedures:    Antimicrobials:  Anti-infectives (From admission, onward)    Start     Dose/Rate Route Frequency Ordered Stop   08/24/23 2130  cefTRIAXone  (ROCEPHIN ) 1 g in sodium chloride  0.9 % 100 mL IVPB        1 g 200 mL/hr over 30 Minutes Intravenous  Once 08/24/23 2125 08/24/23 2226       Data Reviewed: I have personally reviewed following labs and imaging studies CBC: Recent Labs  Lab 08/24/23 1851  WBC 17.4*  NEUTROABS 13.4*  HGB 11.7*  HCT 37.5  MCV 84.5  PLT PLATELET CLUMPS NOTED ON SMEAR, UNABLE TO ESTIMATE   Basic Metabolic Panel: Recent Labs  Lab 08/24/23 1851  NA 137  K 4.3  CL 103  CO2 20*  GLUCOSE 157*  BUN 31*  CREATININE 1.16*  CALCIUM 8.9  MG 2.0   GFR: Estimated Creatinine Clearance: 25.4 mL/min (A) (by C-G formula based on SCr of 1.16 mg/dL (H)). Liver Function Tests: Recent Labs  Lab 08/24/23 1851  AST 35  ALT 14  ALKPHOS 101  BILITOT 1.0  PROT 6.9  ALBUMIN 3.1*   CBG: Recent Labs  Lab 08/24/23 1819  GLUCAP 126*    Recent Results (from the past 240 hours)  Urine Culture     Status: None   Collection Time: 08/24/23  8:55 PM   Specimen: Urine, Random  Result Value Ref Range Status   Specimen Description   Final    URINE, RANDOM Performed at [facility], [address], [place], [place] [address]    Special Requests   Final    NONE Performed at [facility], [address], [place], [place] [address]    Culture   Final    NO GROWTH Performed at [facility], [address], [place], [place] [address]    Report Status 08/26/2023 FINAL  Final     Radiology Studies: No results found.   Scheduled Meds:   stroke: early stages of recovery book   Does not apply Once   escitalopram   10 mg Oral Daily   levothyroxine   75 mcg Oral Q0600   Continuous Infusions:   morphine  3 mg/hr (2023/09/18 1448)     LOS: 4 days    Time spent:  55min  Kjirsten Bloodgood, DO [facility]  To contact the attending physician between 7A-7P please use Epic Chat. To contact the covering physician during after hours 7P-7A, please review Amion.   09-18-23, 5:44 PM   *This document has been created with the assistance of dictation software. Please excuse typographical errors. *

## 2023-09-16 NOTE — Progress Notes (Signed)
 [facility] Liaison Note  Follow up on new referral for hospice services at discharge.  Communicated with Dr. Dezii and Darrian Shoffner, [facility] that patient is actively dying, still with unmanaged dyspnea and I felt patient was too unstable to move.  Informed her family wanted to keep patient at the hospital for hospital death if she was actively dying.  Dr. Leesa rounded and spoke with family and patient will experience hospital death.  We will close this hospice referral.  Thank you, Saddie HILARIO Na, RN Nurse Liaison [phone]

## 2023-09-16 DEATH — deceased

## 2023-09-20 ENCOUNTER — Encounter: Payer: Medicare PPO | Admitting: Internal Medicine
